# Patient Record
Sex: Female | Born: 1960 | Race: White | Hispanic: No | Marital: Married | Smoking: Current every day smoker
Health system: Southern US, Community
[De-identification: ages and names within clinical notes are randomized; demographics above are authoritative.]

## PROBLEM LIST (undated history)

## (undated) DIAGNOSIS — R232 Flushing: Secondary | ICD-10-CM

## (undated) DIAGNOSIS — K219 Gastro-esophageal reflux disease without esophagitis: Secondary | ICD-10-CM

## (undated) DIAGNOSIS — J439 Emphysema, unspecified: Secondary | ICD-10-CM

## (undated) DIAGNOSIS — R61 Generalized hyperhidrosis: Secondary | ICD-10-CM

## (undated) DIAGNOSIS — R51 Headache: Secondary | ICD-10-CM

## (undated) DIAGNOSIS — R519 Headache, unspecified: Secondary | ICD-10-CM

## (undated) DIAGNOSIS — R079 Chest pain, unspecified: Secondary | ICD-10-CM

## (undated) DIAGNOSIS — E785 Hyperlipidemia, unspecified: Secondary | ICD-10-CM

## (undated) DIAGNOSIS — G4700 Insomnia, unspecified: Secondary | ICD-10-CM

## (undated) HISTORY — DX: Emphysema, unspecified: J43.9

## (undated) HISTORY — DX: Gastro-esophageal reflux disease without esophagitis: K21.9

## (undated) HISTORY — DX: Insomnia, unspecified: G47.00

## (undated) HISTORY — DX: Flushing: R23.2

## (undated) HISTORY — PX: OTHER SURGICAL HISTORY: SHX169

## (undated) HISTORY — DX: Chest pain, unspecified: R07.9

## (undated) HISTORY — DX: Headache, unspecified: R51.9

## (undated) HISTORY — DX: Generalized hyperhidrosis: R61

## (undated) HISTORY — DX: Hyperlipidemia, unspecified: E78.5

## (undated) HISTORY — DX: Headache: R51

---

## 2008-01-25 ENCOUNTER — Encounter: Admission: RE | Admit: 2008-01-25 | Discharge: 2008-01-25 | Payer: Self-pay | Admitting: Obstetrics and Gynecology

## 2008-01-26 ENCOUNTER — Encounter: Admission: RE | Admit: 2008-01-26 | Discharge: 2008-01-26 | Payer: Self-pay | Admitting: Cardiology

## 2008-02-03 ENCOUNTER — Inpatient Hospital Stay (HOSPITAL_BASED_OUTPATIENT_CLINIC_OR_DEPARTMENT_OTHER): Admission: RE | Admit: 2008-02-03 | Discharge: 2008-02-03 | Payer: Self-pay | Admitting: Cardiology

## 2008-02-10 ENCOUNTER — Emergency Department (HOSPITAL_COMMUNITY): Admission: EM | Admit: 2008-02-10 | Discharge: 2008-02-10 | Payer: Self-pay | Admitting: Emergency Medicine

## 2011-01-08 NOTE — Cardiovascular Report (Signed)
Rebecca Sweeney, Rebecca Sweeney                ACCOUNT NO.:  [id number]   MEDICAL RECORD NO.:  [id number]          PATIENT TYPE:  OIB   LOCATION:  1962                         FACILITY:  [facility]   PHYSICIAN:  Armanda Magic, M.D.     DATE OF BIRTH:  07/26/1961   DATE OF PROCEDURE:  02/03/2008  DATE OF DISCHARGE:  02/03/2008                            CARDIAC CATHETERIZATION   REFERRING PHYSICIAN:  Geanie Logan, NP and  Dr. Abigail Miyamoto as well.   PROCEDURES:  Left heart catheterization, coronary angiography, and left  ventriculography.   OPERATOR:  Armanda Magic, MD   INDICATIONS:  Chest pain and abnormal Cardiolite.   COMPLICATIONS:  None.  IV access via right femoral artery 6-French  sheath.   IV MEDICATIONS:  Versed 1 mg and fentanyl 25 mcg.   This is a 50 year old female who has a history of chest pain syndrome  somewhat atypical, but EKG shows incomplete right bundle branch block  and her stress Cardiolite study showed irreversible defect with mild-to-  moderate ischemia in the basal anterior and mid anterior region.  She  now presents for cardiac catheterization.   The patient was brought to the cardiac catheterization laboratory in a  fasting nonsedated state.  Informed consent was obtained.  The patient  was connected to continuous heart rate and pulse oximetry monitoring and  intermittent blood pressure monitoring.  The right groin was prepped and  draped in a sterile fashion.  Xylocaine 1% was used for local  anesthesia.  Using the modified Seldinger technique, a 4-French sheath  was placed in right femoral artery.  Under fluoroscopic guidance, a 4-  [place] JL4 catheter was placed in the left coronary artery.  Multiple  plain films were taken at 30-degree RAO and 40-degree LAO views.  This  catheter was then exchanged out over a guidewire for a 4-French 3DRC  catheter.  Multiple plain films were taken at 30-degree RAO and 40-  degree LAO views of the right coronary artery.  The catheter  was then  exchanged out over a guidewire for a 4-French angled pigtail catheter,  which was placed under fluoroscopic guidance in the left ventricular  cavity.  Left ventriculography was performed in the 30-degree RAO view  using total of 30 mL of contrast at 15 mL per second.  The catheter was  then pulled back across the aortic valve with no significant gradient  noted.  At the end of the procedure, all catheters and sheaths were  removed.  Manual compression was performed until adequate hemostasis was  obtained.  The patient was then transferred back to recovery room in  stable condition.   RESULTS:  Left main coronary artery is widely patent and trifurcates  into the left anterior descending artery, ramus branch, and left  circumflex artery.   Left anterior descending artery is widely patent throughout its course  of the apex giving rise to 3 diagonal branches, all of which are widely  patent.   The ramus branch is small in caliber, but long and widely patent.   Left circumflex is widely patent throughout its course in the  AV groove.  It trifurcates into 3 obtuse marginal branches, all of which are widely  patent.   The right coronary artery is widely patent and bifurcates in a posterior  descending artery and posterior lateral artery, both of which are widely  patent.   Left ventriculography shows normal LV systolic function, EF 60%, LV  pressure 111 mmHg, aortic pressure 100/56 mmHg, and LVEDP 14 mmHg.   ASSESSMENT:  1. Normal coronary arteries.  2. Noncardiac chest pain.  3. Normal left ventricular function.   PLAN:  Discharge to home after fully awake and IV fluid and bedrest have  been completed.  The patient will follow up with my nurse practitioner,  Memory Dance, in 2 weeks for groin check, and otherwise follow up with  her primary physician.      Armanda Magic, M.D.  Electronically Signed     TT/MEDQ  D:  02/03/2008  T:  02/03/2008  Job:  [id number]   cc:    Chales Salmon. Abigail Miyamoto, M.D.  Geanie Logan, NP

## 2012-01-06 ENCOUNTER — Other Ambulatory Visit: Payer: Self-pay | Admitting: Obstetrics and Gynecology

## 2012-01-06 DIAGNOSIS — R928 Other abnormal and inconclusive findings on diagnostic imaging of breast: Secondary | ICD-10-CM

## 2012-01-13 ENCOUNTER — Ambulatory Visit
Admission: RE | Admit: 2012-01-13 | Discharge: 2012-01-13 | Disposition: A | Discharge status: Home / Self Care | Payer: BC Managed Care – PPO | Source: Ambulatory Visit | Attending: Obstetrics and Gynecology | Admitting: Obstetrics and Gynecology

## 2012-01-13 DIAGNOSIS — R928 Other abnormal and inconclusive findings on diagnostic imaging of breast: Secondary | ICD-10-CM

## 2012-04-24 ENCOUNTER — Ambulatory Visit (INDEPENDENT_AMBULATORY_CARE_PROVIDER_SITE_OTHER): Payer: BC Managed Care – PPO | Admitting: Family Medicine

## 2012-04-24 ENCOUNTER — Other Ambulatory Visit: Payer: Self-pay | Admitting: Family Medicine

## 2012-04-24 VITALS — BP 117/80 | HR 88 | Temp 97.8°F | Resp 17 | Ht 66.0 in | Wt 142.0 lb

## 2012-04-24 DIAGNOSIS — J069 Acute upper respiratory infection, unspecified: Secondary | ICD-10-CM

## 2012-04-24 DIAGNOSIS — R51 Headache: Secondary | ICD-10-CM

## 2012-04-24 DIAGNOSIS — R1011 Right upper quadrant pain: Secondary | ICD-10-CM

## 2012-04-24 DIAGNOSIS — R110 Nausea: Secondary | ICD-10-CM

## 2012-04-24 DIAGNOSIS — S39011A Strain of muscle, fascia and tendon of abdomen, initial encounter: Secondary | ICD-10-CM

## 2012-04-24 LAB — POCT URINALYSIS DIPSTICK
Bilirubin, UA: NEGATIVE
Glucose, UA: NEGATIVE
Leukocytes, UA: NEGATIVE
Nitrite, UA: NEGATIVE

## 2012-04-24 LAB — POCT CBC
Lymph, poc: 2.6 (ref 0.6–3.4)
MCHC: 30.6 g/dL — AB (ref 31.8–35.4)
MPV: 8.4 fL (ref 0–99.8)
POC Granulocyte: 8.3 — AB (ref 2–6.9)
POC LYMPH PERCENT: 23.1 %L (ref 10–50)
POC MID %: 4.4 %M (ref 0–12)
Platelet Count, POC: 442 10*3/uL — AB (ref 142–424)
RDW, POC: 12.3 %

## 2012-04-24 LAB — COMPREHENSIVE METABOLIC PANEL
ALT: 15 U/L (ref 0–35)
Alkaline Phosphatase: 39 U/L (ref 39–117)
Creat: 0.65 mg/dL (ref 0.50–1.10)
Glucose, Bld: 91 mg/dL (ref 70–99)
Sodium: 140 mEq/L (ref 135–145)
Total Bilirubin: 0.5 mg/dL (ref 0.3–1.2)
Total Protein: 7.5 g/dL (ref 6.0–8.3)

## 2012-04-24 LAB — POCT UA - MICROSCOPIC ONLY
WBC, Ur, HPF, POC: NEGATIVE
Yeast, UA: NEGATIVE

## 2012-04-24 LAB — POCT RAPID STREP A (OFFICE): Rapid Strep A Screen: NEGATIVE

## 2012-04-24 LAB — AMYLASE: Amylase: 42 U/L (ref 0–105)

## 2012-04-24 MED ORDER — PROMETHAZINE-DM 6.25-15 MG/5ML PO SYRP: 5.0000 mL | ORAL_SOLUTION | Freq: Four times a day (QID) | ORAL | Status: AC | PRN

## 2012-04-24 NOTE — Progress Notes (Signed)
Subjective:    Patient ID: Rebecca Sweeney, female    DOB: October 19, 1960, 51 y.o.   MRN: [id number]  HPI This 51 y.o. female presents for evaluation of headache,nausea.   Onset 24 hours ago with headache, RUQ pain that radiates to back.  +myalgias.  No fever; +chills; no sweats.  +HA R frontal-parietal and R periorbital region.  +mild sore throat; +glands are swollen.  +rhinorrhea x few days ago.  +sinus pressure.  Mild cough dry; no SOB.  +nausea; no vomiting.  No diarrhea.  +constant RUQ pain onset this morning after attempts at forced emesis.  Tried to force self to vomit with onset of RUQ pain.  No rash.  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  Taking Tylenol and Aleve; Aleve upsets stomach.  +burning in stomach chronic issue when taking Aleve.  History of GERD 2001; +bacteria in stomach 2001.  No tick bites.   History of migraines but none in past three years.  Severity of headache 7/10.  No vision changes; no diplopia; no neck stiffness.  No dizziness.  No pain with chewing.  No dysuria, urgency, hematuria.  RUQ pain worse with movement, rotation, bending.  RUQ pain not worse after eating or with eating.    PMH: Migraines, Regular Menses Psurg: none All:  None Medications: OCP prescribed by Dr. Nicole Kindred Social: works at [employer]; +tobacco.  Review of Systems  Constitutional: Positive for chills. Negative for fever, diaphoresis and fatigue.  HENT: Positive for congestion, sore throat and rhinorrhea. Negative for hearing loss, ear pain, facial swelling, sneezing, mouth sores, trouble swallowing, neck pain, neck stiffness, voice change, postnasal drip and ear discharge.   Eyes: Negative for photophobia, pain and visual disturbance.  Respiratory: Positive for cough. Negative for shortness of breath.   Gastrointestinal: Positive for nausea and abdominal pain. Negative for vomiting and diarrhea.  Genitourinary: Negative for dysuria,  urgency, frequency, hematuria, flank pain and pelvic pain.  Musculoskeletal: Positive for myalgias.  Skin: Negative for rash.  Neurological: Positive for headaches. Negative for tremors, weakness, light-headedness and numbness.  Psychiatric/Behavioral: Negative for confusion.    No past medical history on file.  No past surgical history on file.  Prior to Admission medications   Not on File    No Known Allergies  History   Social History  . Marital Status: Married    Spouse Name: N/A    Number of Children: N/A  . Years of Education: N/A   Occupational History  . Not on file.   Social History Main Topics  . Smoking status: Current Everyday Smoker -- 1.0 packs/day for 32 years    Types: Cigarettes  . Smokeless tobacco: Not on file  . Alcohol Use: Not on file  . Drug Use: Not on file  . Sexually Active: Not on file   Other Topics Concern  . Not on file   Social History Narrative  . No narrative on file    No family history on file.      Objective:   Physical Exam  Nursing note and vitals reviewed. Constitutional: She is oriented to person, place, and time. She appears well-developed and well-nourished.  HENT:  Head: Normocephalic and atraumatic.  Right Ear: External ear normal.  Left Ear: External ear normal.  Nose: Nose normal.  Mouth/Throat: Oropharynx is clear and moist.  Eyes: Conjunctivae and EOM are normal. Pupils are equal, round, and reactive to light.  Neck: Normal range of motion.  Neck supple. No thyromegaly present.       R ANTERIOR CERVICAL LAD.  Cardiovascular: Normal rate, regular rhythm, normal heart sounds and intact distal pulses.   No murmur heard. Pulmonary/Chest: Effort normal and breath sounds normal.  Abdominal: Soft. Bowel sounds are normal. She exhibits no distension and no mass. There is no hepatosplenomegaly. There is tenderness in the right upper quadrant and epigastric area. There is guarding and CVA tenderness. There is no  rigidity and no rebound. No hernia.  Lymphadenopathy:    She has cervical adenopathy.  Neurological: She is alert and oriented to person, place, and time. She exhibits normal muscle tone. Coordination normal.  Skin: Skin is warm and dry. No rash noted.  Psychiatric: She has a normal mood and affect. Her behavior is normal. Judgment and thought content normal.    Results for orders placed in visit on 04/24/12  POCT CBC      Component Value Range   WBC 11.4 (*) 4.6 - 10.2 K/uL   Lymph, poc 2.6  0.6 - 3.4   POC LYMPH PERCENT 23.1  10 - 50 %L   MID (cbc) 0.5  0 - 0.9   POC MID % 4.4  0 - 12 %M   POC Granulocyte 8.3 (*) 2 - 6.9   Granulocyte percent 72.5  37 - 80 %G   RBC 4.84  4.04 - 5.48 M/uL   Hemoglobin 15.4  12.2 - 16.2 g/dL   HCT, POC 16.1 (*) 09.6 - 47.9 %   MCV 104.1 (*) 80 - 97 fL   MCH, POC 31.8 (*) 27 - 31.2 pg   MCHC 30.6 (*) 31.8 - 35.4 g/dL   RDW, POC 04.5     Platelet Count, POC 442 (*) 142 - 424 K/uL   MPV 8.4  0 - 99.8 fL  POCT UA - MICROSCOPIC ONLY      Component Value Range   WBC, Ur, HPF, POC neg     RBC, urine, microscopic 0-1     Bacteria, U Microscopic trace     Mucus, UA neg     Epithelial cells, urine per micros 2-10     Crystals, Ur, HPF, POC neg     Casts, Ur, LPF, POC neg     Yeast, UA neg    POCT URINALYSIS DIPSTICK      Component Value Range   Color, UA yellow     Clarity, UA clear     Glucose, UA neg     Bilirubin, UA neg     Ketones, UA neg     Spec Grav, UA 1.010     Blood, UA neg     pH, UA 7.5     Protein, UA neg     Urobilinogen, UA 0.2     Nitrite, UA neg     Leukocytes, UA Negative    POCT URINE PREGNANCY      Component Value Range   Preg Test, Ur Negative    POCT RAPID STREP A (OFFICE)      Component Value Range   Rapid Strep A Screen Negative  Negative      Assessment & Plan:   1. Upper respiratory infection  POCT rapid strep A  2. Abdominal pain, right upper quadrant  POCT CBC, POCT UA - Microscopic Only, POCT  urinalysis dipstick, POCT urine pregnancy, Comprehensive metabolic panel, Lipase, Amylase  3. Nausea  POCT CBC, POCT urine pregnancy, Comprehensive metabolic panel, Lipase, Amylase  4.  Abdominal wall  strain 5. Headache  1. URI: New.  Supportive care with rest, fluids, Tylenol.  Rx for Phenergan DM for cough, nausea.  Avoid NSAIDs due to epigastric upset.  Call if no improvement in one week.  OOW note x 2 days. 2.  RUQ Abdominal Pain:  New.  Consistent with muscular strain; worsens with range of motion and eating has no effect.  Recommend Tylenol, heat to area; avoid lifting.  RTC if worsens or associated with eating.   3.  Abdominal Wall Strain: New.  Supportive care with rest, avoid lifting, Tylenol, heat. 4. Nausea: New.  Associated with URI and headache.  Rx for Phenergan DM PRN.   5. Headache:  New.  Associated with URI symptoms, nausea.  Ddx associated with viral illness versus recurrent migraine headache. Normal neuro exam in office; supportive care with rest, Tylenol.  RTC for acute worsening or development of focal neurological symptoms.

## 2012-04-24 NOTE — Patient Instructions (Addendum)
1. Upper respiratory infection  POCT rapid strep A, Culture, Group A Strep  2. Abdominal pain, right upper quadrant  POCT CBC, POCT UA - Microscopic Only, POCT urinalysis dipstick, POCT urine pregnancy, Comprehensive metabolic panel, Lipase, Amylase  3. Nausea  POCT CBC, POCT urine pregnancy, Comprehensive metabolic panel, Lipase, Amylase   Upper Respiratory Infection, Adult An upper respiratory infection (URI) is also sometimes known as the common cold. The upper respiratory tract includes the nose, sinuses, throat, trachea, and bronchi. Bronchi are the airways leading to the lungs. Most people improve within 1 week, but symptoms can last up to 2 weeks. A residual cough may last even longer.  CAUSES Many different viruses can infect the tissues lining the upper respiratory tract. The tissues become irritated and inflamed and often become very moist. Mucus production is also common. A cold is contagious. You can easily spread the virus to others by oral contact. This includes kissing, sharing a glass, coughing, or sneezing. Touching your mouth or nose and then touching a surface, which is then touched by another person, can also spread the virus. SYMPTOMS  Symptoms typically develop 1 to 3 days after you come in contact with a cold virus. Symptoms vary from person to person. They may include:  Runny nose.   Sneezing.   Nasal congestion.   Sinus irritation.   Sore throat.   Loss of voice (laryngitis).   Cough.   Fatigue.   Muscle aches.   Loss of appetite.   Headache.   Low-grade fever.  DIAGNOSIS  You might diagnose your own cold based on familiar symptoms, since most people get a cold 2 to 3 times a year. Your caregiver can confirm this based on your exam. Most importantly, your caregiver can check that your symptoms are not due to another disease such as strep throat, sinusitis, pneumonia, asthma, or epiglottitis. Blood tests, throat tests, and X-rays are not necessary to  diagnose a common cold, but they may sometimes be helpful in excluding other more serious diseases. Your caregiver will decide if any further tests are required. RISKS AND COMPLICATIONS  You may be at risk for a more severe case of the common cold if you smoke cigarettes, have chronic heart disease (such as heart failure) or lung disease (such as asthma), or if you have a weakened immune system. The very young and very old are also at risk for more serious infections. Bacterial sinusitis, middle ear infections, and bacterial pneumonia can complicate the common cold. The common cold can worsen asthma and chronic obstructive pulmonary disease (COPD). Sometimes, these complications can require emergency medical care and may be life-threatening. PREVENTION  The best way to protect against getting a cold is to practice good hygiene. Avoid oral or hand contact with people with cold symptoms. Wash your hands often if contact occurs. There is no clear evidence that vitamin C, vitamin E, echinacea, or exercise reduces the chance of developing a cold. However, it is always recommended to get plenty of rest and practice good nutrition. TREATMENT  Treatment is directed at relieving symptoms. There is no cure. Antibiotics are not effective, because the infection is caused by a virus, not by bacteria. Treatment may include:  Increased fluid intake. Sports drinks offer valuable electrolytes, sugars, and fluids.   Breathing heated mist or steam (vaporizer or shower).   Eating chicken soup or other clear broths, and maintaining good nutrition.   Getting plenty of rest.   Using gargles or lozenges for comfort.  Controlling fevers with ibuprofen or acetaminophen as directed by your caregiver.   Increasing usage of your inhaler if you have asthma.  Zinc gel and zinc lozenges, taken in the first 24 hours of the common cold, can shorten the duration and lessen the severity of symptoms. Pain medicines may help with  fever, muscle aches, and throat pain. A variety of non-prescription medicines are available to treat congestion and runny nose. Your caregiver can make recommendations and may suggest nasal or lung inhalers for other symptoms.  HOME CARE INSTRUCTIONS   Only take over-the-counter or prescription medicines for pain, discomfort, or fever as directed by your caregiver.   Use a warm mist humidifier or inhale steam from a shower to increase air moisture. This may keep secretions moist and make it easier to breathe.   Drink enough water and fluids to keep your urine clear or pale yellow.   Rest as needed.   Return to work when your temperature has returned to normal or as your caregiver advises. You may need to stay home longer to avoid infecting others. You can also use a face mask and careful hand washing to prevent spread of the virus.  SEEK MEDICAL CARE IF:   After the first few days, you feel you are getting worse rather than better.   You need your caregiver's advice about medicines to control symptoms.   You develop chills, worsening shortness of breath, or brown or red sputum. These may be signs of pneumonia.   You develop yellow or brown nasal discharge or pain in the face, especially when you bend forward. These may be signs of sinusitis.   You develop a fever, swollen neck glands, pain with swallowing, or white areas in the back of your throat. These may be signs of strep throat.  SEEK IMMEDIATE MEDICAL CARE IF:   You have a fever.   You develop severe or persistent headache, ear pain, sinus pain, or chest pain.   You develop wheezing, a prolonged cough, cough up blood, or have a change in your usual mucus (if you have chronic lung disease).   You develop sore muscles or a stiff neck.  Document Released: 02/05/2001 Document Revised: 08/01/2011 Document Reviewed: 12/14/2010 [facility] Patient Information 2012 [place], [place].

## 2012-04-25 NOTE — Progress Notes (Signed)
Reviewed and agree.

## 2012-04-27 LAB — CULTURE, GROUP A STREP

## 2013-04-20 ENCOUNTER — Other Ambulatory Visit: Payer: Self-pay | Admitting: Obstetrics and Gynecology

## 2013-04-20 DIAGNOSIS — N6321 Unspecified lump in the left breast, upper outer quadrant: Secondary | ICD-10-CM

## 2013-04-27 ENCOUNTER — Ambulatory Visit
Admission: RE | Admit: 2013-04-27 | Discharge: 2013-04-27 | Disposition: A | Discharge status: Home / Self Care | Payer: PRIVATE HEALTH INSURANCE | Source: Ambulatory Visit | Attending: Obstetrics and Gynecology | Admitting: Obstetrics and Gynecology

## 2013-04-27 DIAGNOSIS — N6321 Unspecified lump in the left breast, upper outer quadrant: Secondary | ICD-10-CM

## 2016-03-18 ENCOUNTER — Other Ambulatory Visit: Payer: Self-pay | Admitting: Obstetrics and Gynecology

## 2016-03-18 DIAGNOSIS — R928 Other abnormal and inconclusive findings on diagnostic imaging of breast: Secondary | ICD-10-CM

## 2016-03-19 ENCOUNTER — Other Ambulatory Visit: Payer: Self-pay | Admitting: Obstetrics and Gynecology

## 2016-03-19 DIAGNOSIS — R928 Other abnormal and inconclusive findings on diagnostic imaging of breast: Secondary | ICD-10-CM

## 2016-03-22 ENCOUNTER — Other Ambulatory Visit: Payer: PRIVATE HEALTH INSURANCE

## 2016-03-25 ENCOUNTER — Ambulatory Visit
Admission: RE | Admit: 2016-03-25 | Discharge: 2016-03-25 | Disposition: A | Discharge status: Home / Self Care | Payer: PRIVATE HEALTH INSURANCE | Source: Ambulatory Visit | Attending: Obstetrics and Gynecology | Admitting: Obstetrics and Gynecology

## 2016-03-25 DIAGNOSIS — R928 Other abnormal and inconclusive findings on diagnostic imaging of breast: Secondary | ICD-10-CM

## 2016-04-03 ENCOUNTER — Encounter: Payer: Self-pay | Admitting: Cardiology

## 2016-04-18 ENCOUNTER — Encounter (INDEPENDENT_AMBULATORY_CARE_PROVIDER_SITE_OTHER): Payer: Self-pay

## 2016-04-18 ENCOUNTER — Ambulatory Visit (INDEPENDENT_AMBULATORY_CARE_PROVIDER_SITE_OTHER): Payer: PRIVATE HEALTH INSURANCE | Admitting: Cardiology

## 2016-04-18 ENCOUNTER — Encounter: Payer: Self-pay | Admitting: Cardiology

## 2016-04-18 VITALS — BP 124/72 | HR 80 | Ht 66.0 in | Wt 136.0 lb

## 2016-04-18 DIAGNOSIS — R0789 Other chest pain: Secondary | ICD-10-CM | POA: Diagnosis not present

## 2016-04-18 DIAGNOSIS — N951 Menopausal and female climacteric states: Secondary | ICD-10-CM

## 2016-04-18 DIAGNOSIS — R232 Flushing: Secondary | ICD-10-CM

## 2016-04-18 DIAGNOSIS — Z720 Tobacco use: Secondary | ICD-10-CM | POA: Diagnosis not present

## 2016-04-18 NOTE — Progress Notes (Signed)
Cardiology Office Note    Date:  04/18/2016   ID:  Rebecca ChamberSonia Branch, DOB 1960-11-13, MRN [id number]  PCP:  No primary care provider on file.  Cardiologist:   Donato SchultzMark Skains, MD     History of Present Illness:  Rebecca Sweeney is a 55 y.o. female here for evaluation of chest pain at the request of Dr. Nilda SimmerKristi Smith.  She has had shortness of breath activity as well as, cutting grass, very fatigued, hot flash. Chest discomfort at times. Always feeling drained as far as energy is concerned. Night sweats at times, insomnia.  3 years ago tired with CP, shoveling with pain. Husband Kathlene NovemberMike is having shoulder surgery and she feels as though she is having to carry more of the load so to speak.   After she may feel her chest discomfort, she likes to put ice packs on her neck or splash herself with cold water to see if this helps. She also states that she may feel some abdominal discomfort following these events and feels very tired and washed out. She has been feeling hot flashes and night sweats, question hormonal?  Only feels symptoms when she rushes herself, stress. Not when walking up hills.   No early family CAD.  She is a smoker.40 years.   Past Medical History:  Diagnosis Date  . Chest pain   . Frequent headaches   . Hot flashes   . Insomnia   . Night sweats     Past Surgical History:  Procedure Laterality Date  . NONE      Current Medications: Outpatient Medications Prior to Visit  Medication Sig Dispense Refill  . buPROPion (WELLBUTRIN XL) 150 MG 24 hr tablet Take 150 mg by mouth daily.    Marland Kitchen. doxycycline (VIBRAMYCIN) 100 MG capsule Take 100 mg by mouth 2 (two) times daily.    . drospirenone-estradiol (ANGELIQ) 0.5-1 MG tablet Take 1 tablet by mouth daily.     No facility-administered medications prior to visit.      Allergies:   Review of patient's allergies indicates no known allergies.   Social History   Social History  . Marital status: Married    Spouse name: MIODRAG    . Number of children: 3  . Years of education: 12   Occupational History  . REPLACEMENT UNLIMITED    Social History Main Topics  . Smoking status: Current Every Day Smoker    Packs/day: 1.00    Years: 32.00    Types: Cigarettes  . Smokeless tobacco: Never Used  . Alcohol use No  . Drug use: No  . Sexual activity: Not Asked   Other Topics Concern  . None   Social History Narrative  . None     Family History:  As above   ROS:   Please see the history of present illness.    ROS All other systems reviewed and are negative.   PHYSICAL EXAM:   VS:  BP 124/72   Pulse 80   Ht 5\' 6"  (1.676 m)   Wt 136 lb (61.7 kg)   LMP 03/18/2012   BMI 21.95 kg/m    GEN: Thin, in no acute distress  HEENT: normal  Neck: no JVD, carotid bruits, or masses Cardiac: RRR; no murmurs, rubs, or gallops,no edema  Respiratory:  clear to auscultation bilaterally, normal work of breathing GI: soft, nontender, nondistended, + BS MS: no deformity or atrophy  Skin: warm and dry, no rash Neuro:  Alert and Oriented x 3, Strength and  sensation are intact Psych: euthymic mood, full affect  Wt Readings from Last 3 Encounters:  04/18/16 136 lb (61.7 kg)  04/24/12 142 lb (64.4 kg)      Studies/Labs Reviewed:   EKG:  EKG is ordered today.  The ekg ordered today demonstrates 04/18/16-sinus rhythm, incomplete right bundle branch block, heart rate 83 bpm personally viewed  Recent Labs: No results found for requested labs within last 8760 hours.   Lipid Panel No results found for: CHOL, TRIG, HDL, CHOLHDL, VLDL, LDLCALC, LDLDIRECT  Additional studies/ records that were reviewed today include:  Prior office notes, lab work, EKG reviewed    ASSESSMENT:    1. Other chest pain   2. Tobacco use   3. Hot flashes      PLAN:  In order of problems listed above:  Chest pain  - Could be stress related, perhaps musculoskeletal. She has had quite an extensive smoking history and still continues to  smoke. In 2009 she did undergo cardiac catheterization with Dr. Mayford Knife and this was reassuring with normal coronary arteries and normal left ventricular function.  - Since it is been a few years since her last evaluation, I will go ahead and order a nuclear stress test to provide further stratification.  Tobacco use  - Strongly encourage tobacco cessation.  Hot flashes/sweats  - She is being followed routinely by GYN. Symptomatic postmenopausal female.  - She is going to be seeing GI I believe for evaluation of abdominal pain.   Medication Adjustments/Labs and Tests Ordered: Current medicines are reviewed at length with the patient today.  Concerns regarding medicines are outlined above.  Medication changes, Labs and Tests ordered today are listed in the Patient Instructions below. Patient Instructions  Medication Instructions:  The current medical regimen is effective;  continue present plan and medications.  Testing/Procedures: Your physician has requested that you have a lexiscan myoview. For further information please visit [email or web address] Please follow instruction sheet, as given.  Follow-Up: Further follow up will be based on the results of the above testing.   If you need a refill on your cardiac medications before your next appointment, please call your pharmacy.  Thank you for choosing [facility]!!        Signed, Donato Schultz, MD  04/18/2016 11:36 AM    [facility] [address] [place], [place], [place]  [address] Phone: [phone]; Fax: [phone]

## 2016-04-18 NOTE — Patient Instructions (Signed)
Medication Instructions:  The current medical regimen is effective;  continue present plan and medications.  Testing/Procedures: Your physician has requested that you have a lexiscan myoview. For further information please visit [email or web address]. Please follow instruction sheet, as given.  Follow-Up: Further follow up will be based on the results of the above testing.  If you need a refill on your cardiac medications before your next appointment, please call your pharmacy.  Thank you for choosing [facility]!!      

## 2016-04-22 ENCOUNTER — Telehealth (HOSPITAL_COMMUNITY): Payer: Self-pay | Admitting: *Deleted

## 2016-04-22 NOTE — Telephone Encounter (Signed)
Attempted to call patient regarding upcoming appointment- no answer, unable to leave message on answering machine.  Antionette CharMary J Norva Bowe, RN

## 2016-04-23 ENCOUNTER — Encounter (INDEPENDENT_AMBULATORY_CARE_PROVIDER_SITE_OTHER): Payer: Self-pay

## 2016-04-23 ENCOUNTER — Ambulatory Visit (HOSPITAL_COMMUNITY): Payer: No Typology Code available for payment source | Attending: Cardiology

## 2016-04-23 DIAGNOSIS — R0609 Other forms of dyspnea: Secondary | ICD-10-CM | POA: Insufficient documentation

## 2016-04-23 DIAGNOSIS — R0789 Other chest pain: Secondary | ICD-10-CM | POA: Insufficient documentation

## 2016-04-23 DIAGNOSIS — R5383 Other fatigue: Secondary | ICD-10-CM | POA: Diagnosis not present

## 2016-04-23 DIAGNOSIS — Z87891 Personal history of nicotine dependence: Secondary | ICD-10-CM | POA: Insufficient documentation

## 2016-04-23 LAB — MYOCARDIAL PERFUSION IMAGING
CHL CUP MPHR: 166 {beats}/min
CHL CUP NUCLEAR SDS: 0
CHL CUP NUCLEAR SRS: 3
CHL CUP NUCLEAR SSS: 3
CSEPED: 9 min
CSEPEW: 10.1 METS
CSEPHR: 87 %
CSEPPHR: 146 {beats}/min
Exercise duration (sec): 0 s
LHR: 0.25
LV dias vol: 103 mL (ref 46–106)
LV sys vol: 45 mL
RPE: 16
Rest HR: 59 {beats}/min
TID: 1.03

## 2016-04-23 MED ORDER — TECHNETIUM TC 99M TETROFOSMIN IV KIT
30.0000 | PACK | Freq: Once | INTRAVENOUS | Status: AC | PRN
  Administered 2016-04-23: 30 via INTRAVENOUS
  Filled 2016-04-23: qty 30

## 2016-04-23 MED ORDER — TECHNETIUM TC 99M TETROFOSMIN IV KIT
10.6000 | PACK | Freq: Once | INTRAVENOUS | Status: AC | PRN
  Administered 2016-04-23: 11 via INTRAVENOUS
  Filled 2016-04-23: qty 11

## 2016-04-30 ENCOUNTER — Encounter: Payer: Self-pay | Admitting: *Deleted

## 2016-10-03 ENCOUNTER — Ambulatory Visit: Payer: Self-pay | Admitting: Registered Nurse

## 2016-10-03 VITALS — BP 118/70 | HR 82 | Temp 97.8°F

## 2016-10-03 DIAGNOSIS — J0101 Acute recurrent maxillary sinusitis: Secondary | ICD-10-CM

## 2016-10-03 NOTE — Progress Notes (Signed)
Pt c/o generalized body aches, "feeling like I've just been beat up." Fatigue, HA. Sx since yesterday. Denies fever/chills, sore throat.

## 2016-10-03 NOTE — Progress Notes (Signed)
Subjective:    Patient ID: Rebecca Sweeney, female    DOB: July 09, 1961, 56 y.o.   MRN: [id number]  Caucasian female exposed to sick coworkers with influenza like illness complains of myalgias, headache and fatigue.  Restarted her singulair and has been helping for cough, post nasal drip/congestion but not body aches or headache      Review of Systems  Constitutional: Positive for activity change and fatigue. Negative for appetite change, chills, diaphoresis, fever and unexpected weight change.  HENT: Positive for congestion, postnasal drip and rhinorrhea. Negative for dental problem, drooling, ear discharge, ear pain, facial swelling, hearing loss, mouth sores, nosebleeds, sinus pain, sinus pressure, sneezing, sore throat, tinnitus, trouble swallowing and voice change.   Eyes: Negative for photophobia, pain, discharge, redness, itching and visual disturbance.  Respiratory: Negative for cough, choking, chest tightness, shortness of breath, wheezing and stridor.   Cardiovascular: Negative for chest pain, palpitations and leg swelling.  Gastrointestinal: Negative for abdominal distention, abdominal pain, blood in stool, constipation, diarrhea, nausea and vomiting.  Endocrine: Negative for cold intolerance and heat intolerance.  Genitourinary: Negative for difficulty urinating, dysuria and hematuria.  Musculoskeletal: Positive for myalgias. Negative for arthralgias, back pain, gait problem, joint swelling, neck pain and neck stiffness.  Skin: Negative for color change, pallor, rash and wound.  Allergic/Immunologic: Positive for environmental allergies. Negative for food allergies.  Neurological: Positive for headaches. Negative for dizziness, tremors, seizures, syncope, facial asymmetry, speech difficulty, weakness, light-headedness and numbness.  Hematological: Negative for adenopathy. Does not bruise/bleed easily.  Psychiatric/Behavioral: Negative for agitation, behavioral problems, confusion and  sleep disturbance.       Objective:   Physical Exam  Constitutional: She is oriented to person, place, and time. Vital signs are normal. She appears well-developed and well-nourished. She is active and cooperative.  Non-toxic appearance. She does not have a sickly appearance. She appears ill. No distress.  HENT:  Head: Normocephalic and atraumatic.  Right Ear: Hearing, external ear and ear canal normal. A middle ear effusion is present.  Left Ear: Hearing, external ear and ear canal normal. A middle ear effusion is present.  Nose: Mucosal edema and rhinorrhea present. No nose lacerations, sinus tenderness, nasal deformity, septal deviation or nasal septal hematoma. No epistaxis.  No foreign bodies. Right sinus exhibits maxillary sinus tenderness and frontal sinus tenderness. Left sinus exhibits maxillary sinus tenderness and frontal sinus tenderness.  Mouth/Throat: Uvula is midline and mucous membranes are normal. Mucous membranes are not pale, not dry and not cyanotic. She does not have dentures. No oral lesions. No trismus in the jaw. Normal dentition. No dental abscesses, uvula swelling, lacerations or dental caries. Posterior oropharyngeal edema and posterior oropharyngeal erythema present. No oropharyngeal exudate or tonsillar abscesses.  Cobblestoning posterior pharynx; nasal turbinates edema/erythema white discharge; bilateral allergic shiners; TMS air fluid level clear  Eyes: Conjunctivae, EOM and lids are normal. Pupils are equal, round, and reactive to light. Right eye exhibits no chemosis, no discharge, no exudate and no hordeolum. No foreign body present in the right eye. Left eye exhibits no chemosis, no discharge, no exudate and no hordeolum. No foreign body present in the left eye. Right conjunctiva is not injected. Right conjunctiva has no hemorrhage. Left conjunctiva is not injected. Left conjunctiva has no hemorrhage. No scleral icterus. Right eye exhibits normal extraocular motion  and no nystagmus. Left eye exhibits normal extraocular motion and no nystagmus. Right pupil is round and reactive. Left pupil is round and reactive. Pupils are equal.  Neck: Trachea normal  and normal range of motion. Neck supple. No tracheal tenderness, no spinous process tenderness and no muscular tenderness present. No neck rigidity. No tracheal deviation, no edema, no erythema and normal range of motion present. No thyroid mass and no thyromegaly present.  Cardiovascular: Normal rate, regular rhythm, S1 normal, S2 normal, normal heart sounds and intact distal pulses.  PMI is not displaced.  Exam reveals no gallop and no friction rub.   No murmur heard. Pulses:      Radial pulses are 2+ on the right side, and 2+ on the left side.  Pulmonary/Chest: Effort normal and breath sounds normal. No accessory muscle usage or stridor. No respiratory distress. She has no decreased breath sounds. She has no wheezes. She has no rhonchi. She has no rales. She exhibits no tenderness.  Intermittent nonproductive cough observed; speaks full sentences without difficulty  Abdominal: Soft. Normal appearance and bowel sounds are normal. She exhibits no shifting dullness, no distension, no pulsatile liver, no fluid wave, no abdominal bruit, no ascites, no pulsatile midline mass and no mass. There is no hepatosplenomegaly, splenomegaly or hepatomegaly. There is tenderness in the right lower quadrant. There is rigidity. There is no rebound, no guarding, no CVA tenderness, no tenderness at McBurney's point and negative Murphy's sign. No hernia. Hernia confirmed negative in the ventral area.  Dull to percussion RLQ RUQ  tympanny LUQ dull LLQ normoactive bowel sounds x 4 quads  Musculoskeletal: Normal range of motion. She exhibits no edema or tenderness.       Right shoulder: Normal.       Left shoulder: Normal.       Right hip: Normal.       Left hip: Normal.       Right knee: Normal.       Left knee: Normal.       Right  ankle: Normal.       Left ankle: Normal.       Cervical back: Normal.       Thoracic back: Normal.       Lumbar back: Normal.       Right hand: Normal.       Left hand: Normal.  Lymphadenopathy:       Head (right side): No submental, no submandibular, no tonsillar, no preauricular, no posterior auricular and no occipital adenopathy present.       Head (left side): No submental, no submandibular, no tonsillar, no preauricular, no posterior auricular and no occipital adenopathy present.    She has no cervical adenopathy.       Right cervical: No superficial cervical, no deep cervical and no posterior cervical adenopathy present.      Left cervical: No superficial cervical, no deep cervical and no posterior cervical adenopathy present.  Neurological: She is alert and oriented to person, place, and time. She has normal strength. She is not disoriented. She displays no atrophy and no tremor. No cranial nerve deficit or sensory deficit. She exhibits normal muscle tone. She displays no seizure activity. Coordination and gait normal. GCS eye subscore is 4. GCS verbal subscore is 5. GCS motor subscore is 6.  Skin: Skin is warm, dry and intact. No abrasion, no bruising, no burn, no ecchymosis, no laceration, no lesion, no petechiae and no rash noted. She is not diaphoretic. No cyanosis or erythema. No pallor. Nails show no clubbing.  Psychiatric: She has a normal mood and affect. Her speech is normal and behavior is normal. Judgment and thought content normal. Cognition and  memory are normal.  Nursing note and vitals reviewed.         Assessment & Plan:  A-recurrent maxillary sinusitis acute  P-Supportive treatment.   No evidence of invasive bacterial infection, non toxic and well hydrated.  This is most likely self limiting viral infection.  I do not see where any further testing or imaging is necessary at this time.   I will suggest supportive care, rest, good hygiene and encourage the patient to  take adequate fluids.  The patient is to return to clinic or EMERGENCY ROOM if symptoms worsen or change significantly e.g. ear pain, fever, purulent discharge from ears or bleeding.  Otitis media effusion noted on exam bilaterally today probably due to post nasal drip from URI Patient verbalized agreement and understanding of treatment plan.    Suspect Viral illness: no evidence of invasive bacterial infection, non toxic and well hydrated.  This is most likely self limiting viral infection.  I do not see where any further testing or imaging is necessary at this time.   I will suggest supportive care, rest, good hygiene and encourage the patient to take adequate fluids.  flonase 1 spray each nostril BID prn, nasal saline 1-2 sprays each nostril prn q2h, tylenol 1000mg  po QID prn pain/fever and or motrin 800mg  po TID prn.shower BID  Hydrate  Discussed honey with lemon and salt water gargles for comfort also.  May continue singulair 10mg  po Qhs at home.  The patient is to return to clinic or EMERGENCY ROOM if symptoms worsen or change significantly e.g. fever, lethargy, SOB, wheezing.  Patient verbalized agreement and understanding of treatment plan.    Restart flonase 1 spray each nostril BID, saline 2 sprays each nostril q2h prn congestion.  If no improvement with 48 hours of saline and flonase use start doxycycline 100mg  po BID x 10 days Dispensed from [facility] see paper chart.  Patient felt doxycycline worked better than amoxicillin/augmentin in past year. No evidence of systemic bacterial infection, non toxic and well hydrated.  I do not see where any further testing or imaging is necessary at this time.   I will suggest supportive care, rest, good hygiene and encourage the patient to take adequate fluids.  The patient is to return to clinic or EMERGENCY ROOM if symptoms worsen or change significantly.   Patient verbalized agreement and understanding of treatment plan and had no further questions at this time.    P2:  Hand washing and cover cough

## 2016-11-21 ENCOUNTER — Ambulatory Visit: Payer: Self-pay | Admitting: Registered Nurse

## 2016-11-21 VITALS — BP 112/74 | HR 87 | Temp 97.8°F

## 2016-11-21 DIAGNOSIS — J111 Influenza due to unidentified influenza virus with other respiratory manifestations: Secondary | ICD-10-CM

## 2016-11-21 DIAGNOSIS — J302 Other seasonal allergic rhinitis: Secondary | ICD-10-CM | POA: Insufficient documentation

## 2016-11-21 DIAGNOSIS — H6593 Unspecified nonsuppurative otitis media, bilateral: Secondary | ICD-10-CM

## 2016-11-21 DIAGNOSIS — J301 Allergic rhinitis due to pollen: Secondary | ICD-10-CM

## 2016-11-21 DIAGNOSIS — R69 Illness, unspecified: Principal | ICD-10-CM

## 2016-11-21 MED ORDER — OSELTAMIVIR PHOSPHATE 75 MG PO CAPS: 75.0000 mg | ORAL_CAPSULE | Freq: Two times a day (BID) | ORAL | 0 refills | Status: AC

## 2016-11-21 MED ORDER — LORATADINE 10 MG PO TABS: 10.0000 mg | ORAL_TABLET | Freq: Every day | ORAL | 1 refills | Status: DC

## 2016-11-21 MED ORDER — AZITHROMYCIN 250 MG PO TABS: ORAL_TABLET | ORAL | 0 refills | Status: DC

## 2016-11-21 NOTE — Patient Instructions (Signed)
Continue singulair 10mg  by mouth daily flonase 1 spray each nostril twice a day after shower Nasal saline 2 sprays each nostril every 2 hours while awake as needed for congestion Use over the counter cough suppressant delsym, nyquil and/or robitussin as needed per manufacturer's instructions Cough drops by mouth ever 2 hours as needed for cough Shower twice a day Hydrate, honey with lemon, popsicles Rest  Start claritin 10mg  by mouth daily Start tamiflu 75mg  by mouth twice a day for 5 days Tylenol 1000mg  by mouth every 6 hours as needed for pain  If worsening sinus pressure/sore throat/ear pain/productive cough start azithromycin 500mg  by mouth day 1 (2 tabs) then take 1 tablet 250mg  by mouth day days 2-[facility]  Seek re-evaluation at PCM/urgent care/ER same day if difficulty breathing/shortness of breath/coughing up blood, vomiting blood, pooping or peeing blood red or black.  If unable to keep down fluids, getting dizzy, short of breath or unable to pee every 6 -8 hours (urine orange/brown)  Otitis Media With Effusion, Pediatric Otitis media with effusion (OME) occurs when there is inflammation of the middle ear and fluid in the middle ear space. There are no signs and symptoms of infection. The middle ear space contains air and the bones for hearing. Air in the middle ear space helps to transmit sound to the brain. OME is a common condition in children, and it often occurs after an ear infection. This condition may be present for several weeks or longer after an ear infection. Most cases of this condition get better on their own. What are the causes? OME is caused by a blockage of the eustachian tube in one or both ears. These tubes drain fluid in the ears to the back of the nose (nasopharynx). If the tissue in the tube swells up (edema), the tube closes. This prevents fluid from draining. Blockage can be caused by:  Ear infections.  Colds and other upper respiratory  infections.  Allergies.  Irritants, such as tobacco smoke.  Enlarged adenoids. The adenoids are areas of soft tissue located high in the back of the throat, behind the nose and the roof of the mouth. They are part of the body's natural defense (immune) system.  A mass in the nasopharynx.  Damage to the ear caused by pressure changes (barotrauma). What increases the risk? Your child is more likely to develop this condition if:  He or she has repeated ear and sinus infections.  He or she has allergies.  He or she is exposed to tobacco smoke.  He or she attends daycare.  He or she is not breastfed. What are the signs or symptoms? Symptoms of this condition may not be obvious. Sometimes this condition does not have any symptoms, or symptoms may overlap with those of a cold or upper respiratory tract illness. Symptoms of this condition include:  Temporary hearing loss.  A feeling of fullness in the ear without pain.  Irritability or agitation.  Balance (vestibular) problems. As a result of hearing loss, your child may:  Listen to the TV at a loud volume.  Not respond to questions.  Ask "What?" often when spoken to.  Mistake or confuse one sound or word for another.  Perform poorly at school.  Have a poor attention span.  Become agitated or irritated easily. How is this diagnosed? This condition is diagnosed with an ear exam. Your child's health care provider will look inside your child's ear with an instrument (otoscope) to check for redness,  swelling, and fluid. Other tests may be done, including:  A test to check the movement of the eardrum (pneumatic otoscopy). This is done by squeezing a small amount of air into the ear.  A test that changes air pressure in the middle ear to check how well the eardrum moves and to see if the eustachian tube is working (tympanogram).  Hearing test (audiogram). This test involves playing tones at different pitches to see if your  child can hear each tone. How is this treated? Treatment for this condition depends on the cause. In many cases, the fluid goes away on its own. In some cases, your child may need a procedure to create a hole in the eardrum to allow fluid to drain (myringotomy) and to insert small drainage tubes (tympanostomy tubes) into the eardrums. These tubes help to drain fluid and prevent infection. This procedure may be recommended if:  OME does not get better over several months.  Your child has many ear infections within several months.  Your child has noticeable hearing loss.  Your child has problems with speech and language development. Surgery may also be done to remove the adenoids (adenoidectomy). Follow these instructions at home:  Give over-the-counter and prescription medicines only as told by your child's health care provider.  Keep children away from any tobacco smoke.  Keep all follow-up visits as told by your child's health care provider. This is important. How is this prevented?  Keep your child's vaccinations up to date. Make sure your child gets all recommended vaccinations, including a pneumonia and flu vaccine.  Encourage hand washing. Your child should wash his or her hands often with soap and water. If there is no soap and water, he or she should use hand sanitizer.  Avoid exposing your child to tobacco smoke.  Breastfeed your baby, if possible. Babies who are breastfed as long as possible are less likely to develop this condition. Contact a health care provider if:  Your child's hearing does not get better after 3 months.  Your child's hearing is worse.  Your child has ear pain.  Your child has a fever.  Your child has drainage from the ear.  Your child is dizzy.  Your child has a lump on his or her neck. Get help right away if:  Your child has bleeding from the nose.  Your child cannot move part of her or his face.  Your child has trouble  breathing.  Your child cannot smell.  Your child develops severe congestion.  Your child develops weakness.  Your child who is younger than 3 months has a temperature of 100F (38C) or higher. Summary  Otitis media with effusion (OME) occurs when there is inflammation of the middle ear and fluid in the middle ear space.  This condition is caused by blockage of one or both eustachian tubes, which drain fluid in the ears to the back of the nose.  Symptoms of this condition can include temporary hearing loss, a feeling of fullness in the ear, irritability or agitation, and balance (vestibular) problems. Sometimes, there are no symptoms.  This condition is diagnosed with an ear exam and tests, such as pneumatic otoscopy, tympanogram, and audiogram.  Treatment for this condition depends on the cause. In many cases, the fluid goes away on its own. This information is not intended to replace advice given to you by your health care provider. Make sure you discuss any questions you have with your health care provider. Document Released: 11/02/2003 Document  Revised: 07/04/2016 Document Reviewed: 07/04/2016 Elsevier Interactive Patient Education  2017 [employer] Pharyngitis Pharyngitis is redness, pain, and swelling (inflammation) of your pharynx. What are the causes? Pharyngitis is usually caused by infection. Most of the time, these infections are from viruses (viral) and are part of a cold. However, sometimes pharyngitis is caused by bacteria (bacterial). Pharyngitis can also be caused by allergies. Viral pharyngitis may be spread from person to person by coughing, sneezing, and personal items or utensils (cups, forks, spoons, toothbrushes). Bacterial pharyngitis may be spread from person to person by more intimate contact, such as kissing. What are the signs or symptoms? Symptoms of pharyngitis include:  Sore throat.  Tiredness (fatigue).  Low-grade fever.  Headache.  Joint pain  and muscle aches.  Skin rashes.  Swollen lymph nodes.  Plaque-like film on throat or tonsils (often seen with bacterial pharyngitis). How is this diagnosed? Your health care provider will ask you questions about your illness and your symptoms. Your medical history, along with a physical exam, is often all that is needed to diagnose pharyngitis. Sometimes, a rapid strep test is done. Other lab tests may also be done, depending on the suspected cause. How is this treated? Viral pharyngitis will usually get better in 3-4 days without the use of medicine. Bacterial pharyngitis is treated with medicines that kill germs (antibiotics). Follow these instructions at home:  Drink enough water and fluids to keep your urine clear or pale yellow.  Only take over-the-counter or prescription medicines as directed by your health care provider:  If you are prescribed antibiotics, make sure you finish them even if you start to feel better.  Do not take aspirin.  Get lots of rest.  Gargle with 8 oz of salt water ( tsp of salt per 1 qt of water) as often as every 1-2 hours to soothe your throat.  Throat lozenges (if you are not at risk for choking) or sprays may be used to soothe your throat. Contact a health care provider if:  You have large, tender lumps in your neck.  You have a rash.  You cough up green, yellow-brown, or bloody spit. Get help right away if:  Your neck becomes stiff.  You drool or are unable to swallow liquids.  You vomit or are unable to keep medicines or liquids down.  You have severe pain that does not go away with the use of recommended medicines.  You have trouble breathing (not caused by a stuffy nose). This information is not intended to replace advice given to you by your health care provider. Make sure you discuss any questions you have with your health care provider. Document Released: 08/12/2005 Document Revised: 01/18/2016 Document Reviewed:  04/19/2013 Elsevier Interactive Patient Education  2017 [employer] Allergies, Adult An allergy is when your body's defense system (immune system) overreacts to an otherwise harmless substance (allergen) that you breathe in or eat or something that touches your skin. When you come into contact with something that you are allergic to, your immune system produces certain proteins (antibodies). These proteins cause cells to release chemicals (histamines) that trigger the symptoms of an allergic reaction. Allergies often affect the nasal passages (allergic rhinitis), eyes (allergic conjunctivitis), skin (atopic dermatitis), and stomach. Allergies can be mild or severe. Allergies cannot spread from person to person (are not contagious). They can develop at any age and may be outgrown. What are the causes? Allergies can be caused by any substance that your immune system mistakenly targets as harmful.  These may include:  Outdoor allergens, such as pollen, grass, weeds, car exhaust, and mold spores.  Indoor allergens, such as dust, smoke, mold, and pet dander.  Foods, especially peanuts, milk, eggs, fish, shellfish, soy, nuts, and wheat.  Medicines, such as penicillin.  Skin irritants, such as detergents, chemicals, and latex.  Perfume.  Insect bites or stings. What increases the risk? You may be at greater risk of allergies if other people in your family have allergies. What are the signs or symptoms? Symptoms depend on what type of allergy you have. They may include:  Runny, stuffy nose.  Sneezing.  Itchy mouth, ears, or throat.  Postnasal drip.  Sore throat.  Itchy, red, watery, or puffy eyes.  Skin rash or hives.  Stomach pain.  Vomiting.  Diarrhea.  Bloating.  Wheezing or coughing. People with a severe allergy to food, medicine, or an insect bite may have a life-threatening allergic reaction (anaphylaxis). Symptoms of anaphylaxis  include:  Hives.  Itching.  Flushed face.  Swollen lips, tongue, or mouth.  Tight or swollen throat.  Chest pain or tightness in the chest.  Trouble breathing or shortness of breath.  Rapid heartbeat.  Dizziness or fainting.  Vomiting.  Diarrhea.  Pain in the abdomen. How is this diagnosed? This condition is diagnosed based on:  Your symptoms.  Your family and medical history.  A physical exam. You may need to see a health care provider who specializes in treating allergies (allergist). You may also have tests, including:  Skin tests to see which allergens are causing your symptoms, such as:  Skin prick test. In this test, your skin is pricked with a tiny needle and exposed to small amounts of possible allergens to see if your skin reacts.  Intradermal skin test. In this test, a small amount of allergen is injected under your skin to see if your skin reacts.  Patch test. In this test, a small amount of allergen is placed on your skin and then your skin is covered with a bandage. Your health care provider will check your skin after a couple of days to see if a rash has developed.  Blood tests.  Challenges tests. In this test, you inhale a small amount of allergen by mouth to see if you have an allergic reaction. You may also be asked to:  Keep a food diary. A food diary is a record of all the foods and drinks you have in a day and any symptoms you experience.  Practice an elimination diet. An elimination diet involves eliminating specific foods from your diet and then adding them back in one by one to find out if a certain food causes an allergic reaction. How is this treated? Treatment for allergies depends on your symptoms. Treatment may include:  Cold compresses to soothe itching and swelling.  Eye drops.  Nasal sprays.  Using a saline spray or container (neti pot) to flush out the nose (nasal irrigation). These methods can help clear away mucus and keep  the nasal passages moist.  Using a humidifier.  Oral antihistamines or other medicines to block allergic reaction and inflammation.  Skin creams to treat rashes or itching.  Diet changes to eliminate food allergy triggers.  Repeated exposure to tiny amounts of allergens to build up a tolerance and prevent future allergic reactions (immunotherapy). These include:  Allergy shots.  Oral treatment. This involves taking small doses of an allergen under the tongue (sublingual immunotherapy).  Emergency epinephrine injection (auto-injector) in case of  an allergic emergency. This is a self-injectable, pre-measured medicine that must be given within the first few minutes of a serious allergic reaction. Follow these instructions at home:  Avoid known allergens whenever possible.  If you suffer from airborne allergens, wash out your nose daily. You can do this with a saline spray or a neti pot to flush out your nose (nasal irrigation).  Take over-the-counter and prescription medicines only as told by your health care provider.  Keep all follow-up visits as told by your health care provider. This is important.  If you are at risk of a severe allergic reaction (anaphylaxis), keep your auto-injector with you at all times.  If you have ever had anaphylaxis, wear a medical alert bracelet or necklace that states you have a severe allergy. Contact a health care provider if:  Your symptoms do not improve with treatment. Get help right away if:  You have symptoms of anaphylaxis, such as:  Swollen mouth, tongue, or throat.  Pain or tightness in your chest.  Trouble breathing or shortness of breath.  Dizziness or fainting.  Severe abdominal pain, vomiting, or diarrhea. This information is not intended to replace advice given to you by your health care provider. Make sure you discuss any questions you have with your health care provider. Document Released: 11/05/2002 Document Revised:  04/11/2016 Document Reviewed: 02/28/2016 Elsevier Interactive Patient Education  2017 [employer] Influenza, Adult Influenza, more commonly known as "the flu," is a viral infection that primarily affects the respiratory tract. The respiratory tract includes organs that help you breathe, such as the lungs, nose, and throat. The flu causes many common cold symptoms, as well as a high fever and body aches. The flu spreads easily from person to person (is contagious). Getting a flu shot (influenza vaccination) every year is the best way to prevent influenza. What are the causes? Influenza is caused by a virus. You can catch the virus by:  Breathing in droplets from an infected person's cough or sneeze.  Touching something that was recently contaminated with the virus and then touching your mouth, nose, or eyes. What increases the risk? The following factors may make you more likely to get the flu:  Not cleaning your hands frequently with soap and water or alcohol-based hand sanitizer.  Having close contact with many people during cold and flu season.  Touching your mouth, eyes, or nose without washing or sanitizing your hands first.  Not drinking enough fluids or not eating a healthy diet.  Not getting enough sleep or exercise.  Being under a high amount of stress.  Not getting a yearly (annual) flu shot. You may be at a higher risk of complications from the flu, such as a severe lung infection (pneumonia), if you:  Are over the age of 56.  Are pregnant.  Have a weakened disease-fighting system (immune system). You may have a weakened immune system if you:  Have HIV or AIDS.  Are undergoing chemotherapy.  Aretaking medicines that reduce the activity of (suppress) the immune system.  Have a long-term (chronic) illness, such as heart disease, kidney disease, diabetes, or lung disease.  Have a liver disorder.  Are obese.  Have anemia. What are the signs or  symptoms? Symptoms of this condition typically last 4-10 days and may include:  Fever.  Chills.  Headache, body aches, or muscle aches.  Sore throat.  Cough.  Runny or congested nose.  Chest discomfort and cough.  Poor appetite.  Weakness or tiredness (fatigue).  Dizziness.  Nausea or vomiting. How is this diagnosed? This condition may be diagnosed based on your medical history and a physical exam. Your health care provider may do a nose or throat swab test to confirm the diagnosis. How is this treated? If influenza is detected early, you can be treated with antiviral medicine that can reduce the length of your illness and the severity of your symptoms. This medicine may be given by mouth (orally) or through an IV tube that is inserted in one of your veins. The goal of treatment is to relieve symptoms by taking care of yourself at home. This may include taking over-the-counter medicines, drinking plenty of fluids, and adding humidity to the air in your home. In some cases, influenza goes away on its own. Severe influenza or complications from influenza may be treated in a hospital. Follow these instructions at home:  Take over-the-counter and prescription medicines only as told by your health care provider.  Use a cool mist humidifier to add humidity to the air in your home. This can make breathing easier.  Rest as needed.  Drink enough fluid to keep your urine clear or pale yellow.  Cover your mouth and nose when you cough or sneeze.  Wash your hands with soap and water often, especially after you cough or sneeze. If soap and water are not available, use hand sanitizer.  Stay home from work or school as told by your health care provider. Unless you are visiting your health care provider, try to avoid leaving home until your fever has been gone for 24 hours without the use of medicine.  Keep all follow-up visits as told by your health care provider. This is  important. How is this prevented?  Getting an annual flu shot is the best way to avoid getting the flu. You may get the flu shot in late summer, fall, or winter. Ask your health care provider when you should get your flu shot.  Wash your hands often or use hand sanitizer often.  Avoid contact with people who are sick during cold and flu season.  Eat a healthy diet, drink plenty of fluids, get enough sleep, and exercise regularly. Contact a health care provider if:  You develop new symptoms.  You have:  Chest pain.  Diarrhea.  A fever.  Your cough gets worse.  You produce more mucus.  You feel nauseous or you vomit. Get help right away if:  You develop shortness of breath or difficulty breathing.  Your skin or nails turn a bluish color.  You have severe pain or stiffness in your neck.  You develop a sudden headache or sudden pain in your face or ear.  You cannot stop vomiting. This information is not intended to replace advice given to you by your health care provider. Make sure you discuss any questions you have with your health care provider. Document Released: 08/09/2000 Document Revised: 01/18/2016 Document Reviewed: 06/06/2015 Else[employer] Interactive Patient Education  2017 [employer].

## 2016-11-21 NOTE — Progress Notes (Signed)
Subjective:    Patient ID: Rebecca Sweeney, female    DOB: 1961/07/28, 56 y.o.   MRN: [id number]  55y/o caucasian married female c/o sore throat, dry cough, chills, mild body aches since last night. Denies n/v/d. Afebrile at this time.  Cares for father in law at home has lung problems/collapsed lung been in and out of hospital.  Last antibiotics in past 6 months doxycycline and amoxicillin per paper occupational medicine chart review paper.  Patient is using singulair, flonase and nasal saline they are helping with allergic rhinitis  Hasn't been sleeping well or eating well due to father in law illness/unable to care for himself living with her.  Sore throat and body aches/fatigue the worst of her symptoms.  Some ear pressure      Review of Systems  Constitutional: Positive for chills, diaphoresis and fatigue. Negative for activity change, appetite change, fever and unexpected weight change.  HENT: Positive for congestion, ear pain, postnasal drip, rhinorrhea and sore throat. Negative for dental problem, drooling, ear discharge, facial swelling, hearing loss, mouth sores, nosebleeds, sinus pain, sinus pressure, sneezing, tinnitus, trouble swallowing and voice change.   Eyes: Positive for itching. Negative for photophobia, pain, discharge, redness and visual disturbance.  Respiratory: Positive for cough. Negative for choking, chest tightness, shortness of breath, wheezing and stridor.   Cardiovascular: Negative for chest pain, palpitations and leg swelling.  Gastrointestinal: Negative for abdominal distention, abdominal pain, blood in stool, constipation, diarrhea, nausea and vomiting.  Endocrine: Negative for cold intolerance and heat intolerance.  Genitourinary: Negative for difficulty urinating, dysuria and hematuria.  Musculoskeletal: Positive for myalgias. Negative for arthralgias, back pain, gait problem, joint swelling, neck pain and neck stiffness.  Skin: Negative for color change, pallor,  rash and wound.  Allergic/Immunologic: Positive for environmental allergies. Negative for food allergies.  Neurological: Positive for headaches. Negative for dizziness, tremors, seizures, syncope, facial asymmetry, speech difficulty, weakness, light-headedness and numbness.  Hematological: Negative for adenopathy. Does not bruise/bleed easily.  Psychiatric/Behavioral: Positive for sleep disturbance. Negative for agitation, behavioral problems and confusion.       Objective:   Physical Exam  Constitutional: She is oriented to person, place, and time. Vital signs are normal. She appears well-developed and well-nourished. She is active and cooperative.  Non-toxic appearance. She does not have a sickly appearance. She appears ill. No distress.  HENT:  Head: Normocephalic and atraumatic.  Right Ear: Hearing, external ear and ear canal normal. A middle ear effusion is present.  Left Ear: Hearing, external ear and ear canal normal. A middle ear effusion is present.  Nose: Mucosal edema and rhinorrhea present. No nose lacerations, sinus tenderness, nasal deformity, septal deviation or nasal septal hematoma. No epistaxis.  No foreign bodies. Right sinus exhibits no maxillary sinus tenderness and no frontal sinus tenderness. Left sinus exhibits no maxillary sinus tenderness and no frontal sinus tenderness.  Mouth/Throat: Uvula is midline and mucous membranes are normal. Mucous membranes are not pale, not dry and not cyanotic. She does not have dentures. No oral lesions. No trismus in the jaw. Normal dentition. No dental abscesses, uvula swelling, lacerations or dental caries. Posterior oropharyngeal edema and posterior oropharyngeal erythema present. No oropharyngeal exudate or tonsillar abscesses.  Cobblestoning posterior pharynx; bilateral TMs air fluid level clear; bilateral allergic shiners; bilateral nasal turbinates edema/erythema clear discharge; oropharynx macular erythema  Eyes: Conjunctivae, EOM  and lids are normal. Pupils are equal, round, and reactive to light. Right eye exhibits no chemosis, no discharge, no exudate and no hordeolum.  No foreign body present in the right eye. Left eye exhibits no chemosis, no discharge, no exudate and no hordeolum. No foreign body present in the left eye. Right conjunctiva is not injected. Right conjunctiva has no hemorrhage. Left conjunctiva is not injected. Left conjunctiva has no hemorrhage. No scleral icterus. Right eye exhibits normal extraocular motion and no nystagmus. Left eye exhibits normal extraocular motion and no nystagmus. Right pupil is round and reactive. Left pupil is round and reactive. Pupils are equal.  Neck: Trachea normal and normal range of motion. Neck supple. No tracheal tenderness, no spinous process tenderness and no muscular tenderness present. No neck rigidity. No tracheal deviation, no edema, no erythema and normal range of motion present. No thyroid mass and no thyromegaly present.  Cardiovascular: Normal rate, regular rhythm, S1 normal, S2 normal, normal heart sounds and intact distal pulses.  PMI is not displaced.  Exam reveals no gallop and no friction rub.   No murmur heard. Pulses:      Radial pulses are 2+ on the right side, and 2+ on the left side.  Pulmonary/Chest: Effort normal and breath sounds normal. No accessory muscle usage or stridor. No respiratory distress. She has no decreased breath sounds. She has no wheezes. She has no rhonchi. She has no rales. She exhibits no tenderness.  Speaks full sentences no cough observed  Abdominal: Soft. Normal appearance. She exhibits no distension.  Musculoskeletal: Normal range of motion. She exhibits no edema or tenderness.       Right shoulder: Normal.       Left shoulder: Normal.       Right hip: Normal.       Left hip: Normal.       Right knee: Normal.       Left knee: Normal.       Cervical back: Normal.       Right hand: Normal.       Left hand: Normal.   Lymphadenopathy:       Head (right side): No submental, no submandibular, no tonsillar, no preauricular, no posterior auricular and no occipital adenopathy present.       Head (left side): No submental, no submandibular, no tonsillar, no preauricular, no posterior auricular and no occipital adenopathy present.    She has no cervical adenopathy.       Right cervical: No superficial cervical, no deep cervical and no posterior cervical adenopathy present.      Left cervical: No superficial cervical, no deep cervical and no posterior cervical adenopathy present.  Neurological: She is alert and oriented to person, place, and time. She has normal strength. She is not disoriented. She displays no atrophy and no tremor. No cranial nerve deficit or sensory deficit. She exhibits normal muscle tone. She displays no seizure activity. Coordination and gait normal. GCS eye subscore is 4. GCS verbal subscore is 5. GCS motor subscore is 6.  On off exam table without difficulty; bilateral extremities equal strength 5/5  Skin: Skin is warm and intact. No abrasion, no bruising, no burn, no ecchymosis, no laceration, no lesion, no petechiae and no rash noted. She is diaphoretic. No cyanosis or erythema. No pallor. Nails show no clubbing.  Skin face moist warm  Psychiatric: She has a normal mood and affect. Her speech is normal and behavior is normal. Judgment and thought content normal. Cognition and memory are normal.  Nursing note and vitals reviewed.         Assessment & Plan:  A-influenza like illness,  otitis media effusion bilaterally; seasonal allergic rhinitis  P-Electronic Rx tamiflu 75mg  po BID x 5 days #10 RF0.  Hydrate hydrate hydrate.if unable to maintain po intake or urine orange/brown/unable to void every 8 hours follow up with PCM/UCC/ER for re-evaluation. Tylenol 1000mg  po q6h prn pain/fever.  Honey with lemon.  Community with upsurge influenza B and this is 4th employee seen today with influenza  like symptoms.  Suspect Viral illness: no evidence of invasive bacterial infection, non toxic and well hydrated.  This is most likely self limiting viral infection.  I do not see where any further testing or imaging is necessary at this time.   I will suggest supportive care, rest, good hygiene and encourage the patient to take adequate fluids.  Does not require work excuse. Patient to talk with supervisor discussed recommend staying home until afebrile 24 hours off tylenol. nasal saline 1-2 sprays each nostril prn q2h,.  Continue singulair 10mg  po daily, flonase 1 spray each nostril BID, nasal saline 2 spray  Each nostril q2h wa, shower BID. Discussed honey with lemon and salt water gargles for comfort also.  The patient is to return to clinic or EMERGENCY ROOM if symptoms worsen or change significantly e.g. Hemoptysis, Dyspnea, dysphagia, vomiting, lethargy, SOB, wheezing. Patient verbalized agreement and understanding of treatment plan and had no further questions at this time.  Patient may use normal saline nasal spray as needed.  Consider starting claritin 10mg  po daiily as increased pollen counts with spring bloom.  Continue singulair, nasal saline and flonase.  Avoid triggers if possible.  Shower prior to bedtime if exposed to triggers.  If allergic dust/dust mites recommend mattress/pillow covers/encasements; washing linens, vacuuming, sweeping, dusting weekly.  Call or return to clinic as needed if these symptoms worsen or fail to improve as anticipated.   Exitcare handout on allergic rhinitis given to patient.  Patient verbalized understanding of instructions, agreed with plan of care and had no further questions at this time.  P2:  Avoidance and hand washing.  Usually no specific medical treatment is needed if a virus is causing the sore throat.  The throat most often gets better on its own within 5 to 7 days.  Antibiotic medicine does not cure viral pharyngitis.  Patient given azithromycin Rx from  [facility] in case worsening sore throat despite claritin, singulair, flonase and nasal saline use x 48 hours and shower BID.  Azithromycin 250mg  take 2 tabs day 1 then 1 tab po daily days 2-5 #6 RF0. For acute pharyngitis caused by bacteria, your healthcare provider will prescribe an antibiotic.  Marland Kitchen Do not smoke.  Marland Kitchen Avoid secondhand smoke and other air pollutants.  . Use a cool mist humidifier to add moisture to the air.  . Get plenty of rest.  . You may want to rest your throat by talking less and eating a diet that is mostly liquid or soft for a day or two.   Marland Kitchen Nonprescription throat lozenges and mouthwashes should help relieve the soreness.   . Gargling with warm saltwater and drinking warm liquids may help.  (You can make a saltwater solution by adding 1/4 teaspoon of salt to 8 ounces, or 240 mL, of warm water.)  . A nonprescription pain reliever such as aspirin, acetaminophen, or ibuprofen may ease general aches and pains.   FOLLOW UP with clinic provider if no improvements in the next 7-10 days.  Patient verbalized understanding of instructions and agreed with plan of care. P2:  Hand washing and diet.  Given azithromycin from [facility] due to frail father in law at home and patient not eating/sleeping well either at risk to progress from viral to bacterial. Patient to start if SOB/dyspnea/chest pain/opaque productive sputum cough and/or worsening sore throat or sinus pain/pressure. Bronchitis simple, community acquired, may have started as viral (probably respiratory syncytial, parainfluenza, influenza, or adenovirus), but now evidence of acute purulent bronchitis with resultant bronchial edema and mucus formation.  Viruses are the most common cause of bronchial inflammation in otherwise healthy adults with acute bronchitis.  The appearance of sputum is not predictive of whether a bacterial infection is present.  Purulent sputum is most often caused by viral infections.  There are a small portion of those  caused by non-viral agents being Mycoplama pneumonia.  Microscopic examination or C&S of sputum in the healthy adult with acute bronchitis is generally not helpful (usually negative or normal respiratory flora) other considerations being cough from upper respiratory tract infections, sinusitis or allergic syndromes (mild asthma or viral pneumonia).  Differential Diagnoses:  reactive airway disease (asthma, allergic aspergillosis (eosinophilia), chronic bronchitis, respiratory infection (sinusitis, common cold, pneumonia), congestive heart failure, reflux esophagitis, bronchogenic tumor, aspiration syndromes and/or exposure to pulmonary irritants/smoke.Without high fever, severe dyspnea, lack of physical findings or other risk factors, I will hold on a chest radiograph and CBC at this time.  I discussed that approximately 50% of patients with acute bronchitis have a cough that lasts up to three weeks, and 25% for over a month.  Tylenol, one to two tablets every four hours as needed for fever or myalgias.  No aspirin.  Patient instructed to follow up in one week or sooner if symptoms worsen.  Patient verbalized agreement and understanding of treatment plan.  P2:  hand washing and cover cough

## 2016-12-10 ENCOUNTER — Ambulatory Visit: Payer: Self-pay | Admitting: Registered Nurse

## 2016-12-10 DIAGNOSIS — J320 Chronic maxillary sinusitis: Secondary | ICD-10-CM

## 2016-12-10 DIAGNOSIS — H6593 Unspecified nonsuppurative otitis media, bilateral: Secondary | ICD-10-CM | POA: Insufficient documentation

## 2016-12-10 DIAGNOSIS — J329 Chronic sinusitis, unspecified: Secondary | ICD-10-CM | POA: Insufficient documentation

## 2016-12-10 MED ORDER — PREDNISONE 10 MG PO TABS: 20.0000 mg | ORAL_TABLET | Freq: Every day | ORAL | 0 refills | Status: AC

## 2016-12-10 MED ORDER — PHENYLEPHRINE HCL 5 MG PO TABS: 5.0000 mg | ORAL_TABLET | Freq: Four times a day (QID) | ORAL | 0 refills | Status: AC | PRN

## 2016-12-10 MED ORDER — SALINE SPRAY 0.65 % NA SOLN: 2.0000 | NASAL | 0 refills | Status: DC

## 2016-12-10 NOTE — Patient Instructions (Signed)
Continue montelukast  by mouth at bedtime Nasal saline 2 sprays each nostril every 2hours as needed congestion Shower twice a day claritin  by mouth daily Phenylephrine  by mouth every 6 hours as needed runny nose Prednisone  (2  tabs) with breakfast x 10 days Honey with lemon Hydrate Tylenol  by mouth every 6 hours as needed for pain/fever Start women's multivitamin with zinc and B12/iron by mouth daily Will check zinc, B12, Vitamin D levels at next be well lab draw Recommend meat/fish at least one meal a day  Consider mask when outside or exposed to a lot of dust Follow up Thursday for re-evaluation  Otitis Media With Effusion, Pediatric Otitis media with effusion (OME) occurs when there is inflammation of the middle ear and fluid in the middle ear space. There are no signs and symptoms of infection. The middle ear space contains air and the bones for hearing. Air in the middle ear space helps to transmit sound to the brain. OME is a common condition in children, and it often occurs after an ear infection. This condition may be present for several weeks or longer after an ear infection. Most cases of this condition get better on their own. What are the causes? OME is caused by a blockage of the eustachian tube in one or both ears. These tubes drain fluid in the ears to the back of the nose (nasopharynx). If the tissue in the tube swells up (edema), the tube closes. This prevents fluid from draining. Blockage can be caused by:  Ear infections.  Colds and other upper respiratory infections.  Allergies.  Irritants, such as tobacco smoke.  Enlarged adenoids. The adenoids are areas of soft tissue located high in the back of the throat, behind the nose and the roof of the mouth. They are part of the body's natural defense (immune) system.  A mass in the nasopharynx.  Damage to the ear caused by pressure changes (barotrauma). What increases the risk? Your child  is more likely to develop this condition if:  He or she has repeated ear and sinus infections.  He or she has allergies.  He or she is exposed to tobacco smoke.  He or she attends daycare.  He or she is not breastfed. What are the signs or symptoms? Symptoms of this condition may not be obvious. Sometimes this condition does not have any symptoms, or symptoms may overlap with those of a cold or upper respiratory tract illness. Symptoms of this condition include:  Temporary hearing loss.  A feeling of fullness in the ear without pain.  Irritability or agitation.  Balance (vestibular) problems. As a result of hearing loss, your child may:  Listen to the TV at a loud volume.  Not respond to questions.  Ask "What?" often when spoken to.  Mistake or confuse one sound or word for another.  Perform poorly at school.  Have a poor attention span.  Become agitated or irritated easily. How is this diagnosed? This condition is diagnosed with an ear exam. Your child's health care provider will look inside your child's ear with an instrument (otoscope) to check for redness, swelling, and fluid. Other tests may be done, including:  A test to check the movement of the eardrum (pneumatic otoscopy). This is done by squeezing a small amount of air into the ear.  A test that changes air pressure in the middle ear to check how well the eardrum moves and to see if the eustachian tube is working (  tympanogram).  Hearing test (audiogram). This test involves playing tones at different pitches to see if your child can hear each tone. How is this treated? Treatment for this condition depends on the cause. In many cases, the fluid goes away on its own. In some cases, your child may need a procedure to create a hole in the eardrum to allow fluid to drain (myringotomy) and to insert small drainage tubes (tympanostomy tubes) into the eardrums. These tubes help to drain fluid and prevent infection.  This procedure may be recommended if:  OME does not get better over several months.  Your child has many ear infections within several months.  Your child has noticeable hearing loss.  Your child has problems with speech and language development. Surgery may also be done to remove the adenoids (adenoidectomy). Follow these instructions at home:  Give over-the-counter and prescription medicines only as told by your child's health care provider.  Keep children away from any tobacco smoke.  Keep all follow-up visits as told by your child's health care provider. This is important. How is this prevented?  Keep your child's vaccinations up to date. Make sure your child gets all recommended vaccinations, including a pneumonia and flu vaccine.  Encourage hand washing. Your child should wash his or her hands often with soap and water. If there is no soap and water, he or she should use hand sanitizer.  Avoid exposing your child to tobacco smoke.  Breastfeed your baby, if possible. Babies who are breastfed as long as possible are less likely to develop this condition. Contact a health care provider if:  Your child's hearing does not get better after 3 months.  Your child's hearing is worse.  Your child has ear pain.  Your child has a fever.  Your child has drainage from the ear.  Your child is dizzy.  Your child has a lump on his or her neck. Get help right away if:  Your child has bleeding from the nose.  Your child cannot move part of her or his face.  Your child has trouble breathing.  Your child cannot smell.  Your child develops severe congestion.  Your child develops weakness.  Your child who is younger than 3 months has a temperature of 100F (38C) or higher. Summary  Otitis media with effusion (OME) occurs when there is inflammation of the middle ear and fluid in the middle ear space.  This condition is caused by blockage of one or both eustachian tubes,  which drain fluid in the ears to the back of the nose.  Symptoms of this condition can include temporary hearing loss, a feeling of fullness in the ear, irritability or agitation, and balance (vestibular) problems. Sometimes, there are no symptoms.  This condition is diagnosed with an ear exam and tests, such as pneumatic otoscopy, tympanogram, and audiogram.  Treatment for this condition depends on the cause. In many cases, the fluid goes away on its own. This information is not intended to replace advice given to you by your health care provider. Make sure you discuss any questions you have with your health care provider. Document Released: 11/02/2003 Document Revised: 07/04/2016 Document Reviewed: 07/04/2016 Elsevier Interactive Patient Education  2017 [employer] Pharyngitis Pharyngitis is redness, pain, and swelling (inflammation) of your pharynx. What are the causes? Pharyngitis is usually caused by infection. Most of the time, these infections are from viruses (viral) and are part of a cold. However, sometimes pharyngitis is caused by bacteria (bacterial). Pharyngitis can also  be caused by allergies. Viral pharyngitis may be spread from person to person by coughing, sneezing, and personal items or utensils (cups, forks, spoons, toothbrushes). Bacterial pharyngitis may be spread from person to person by more intimate contact, such as kissing. What are the signs or symptoms? Symptoms of pharyngitis include:  Sore throat.  Tiredness (fatigue).  Low-grade fever.  Headache.  Joint pain and muscle aches.  Skin rashes.  Swollen lymph nodes.  Plaque-like film on throat or tonsils (often seen with bacterial pharyngitis). How is this diagnosed? Your health care provider will ask you questions about your illness and your symptoms. Your medical history, along with a physical exam, is often all that is needed to diagnose pharyngitis. Sometimes, a rapid strep test is done. Other lab  tests may also be done, depending on the suspected cause. How is this treated? Viral pharyngitis will usually get better in 3-4 days without the use of medicine. Bacterial pharyngitis is treated with medicines that kill germs (antibiotics). Follow these instructions at home:  Drink enough water and fluids to keep your urine clear or pale yellow.  Only take over-the-counter or prescription medicines as directed by your health care provider:  If you are prescribed antibiotics, make sure you finish them even if you start to feel better.  Do not take aspirin.  Get lots of rest.  Gargle with 8 oz of salt water ( tsp of salt per 1 qt of water) as often as every 1-2 hours to soothe your throat.  Throat lozenges (if you are not at risk for choking) or sprays may be used to soothe your throat. Contact a health care provider if:  You have large, tender lumps in your neck.  You have a rash.  You cough up green, yellow-brown, or bloody spit. Get help right away if:  Your neck becomes stiff.  You drool or are unable to swallow liquids.  You vomit or are unable to keep medicines or liquids down.  You have severe pain that does not go away with the use of recommended medicines.  You have trouble breathing (not caused by a stuffy nose). This information is not intended to replace advice given to you by your health care provider. Make sure you discuss any questions you have with your health care provider. Document Released: 08/12/2005 Document Revised: 01/18/2016 Document Reviewed: 04/19/2013 Elsevier Interactive Patient Education  2017 [employer] Sinusitis, Adult Sinusitis is soreness and inflammation of your sinuses. Sinuses are hollow spaces in the bones around your face. Your sinuses are located:  Around your eyes.  In the middle of your forehead.  Behind your nose.  In your cheekbones. Your sinuses and nasal passages are lined with a stringy fluid (mucus). Mucus normally  drains out of your sinuses. When your nasal tissues become inflamed or swollen, the mucus can become trapped or blocked so air cannot flow through your sinuses. This allows bacteria, viruses, and funguses to grow, which leads to infection. Sinusitis can develop quickly and last for 7?10 days (acute) or for more than 12 weeks (chronic). Sinusitis often develops after a cold. What are the causes? This condition is caused by anything that creates swelling in the sinuses or stops mucus from draining, including:  Allergies.  Asthma.  Bacterial or viral infection.  Abnormally shaped bones between the nasal passages.  Nasal growths that contain mucus (nasal polyps).  Narrow sinus openings.  Pollutants, such as chemicals or irritants in the air.  A foreign object stuck in the nose.  A fungal infection. This is rare. What increases the risk? The following factors may make you more likely to develop this condition:  Having allergies or asthma.  Having had a recent cold or respiratory tract infection.  Having structural deformities or blockages in your nose or sinuses.  Having a weak immune system.  Doing a lot of swimming or diving.  Overusing nasal sprays.  Smoking. What are the signs or symptoms? The main symptoms of this condition are pain and a feeling of pressure around the affected sinuses. Other symptoms include:  Upper toothache.  Earache.  Headache.  Bad breath.  Decreased sense of smell and taste.  A cough that may get worse at night.  Fatigue.  Fever.  Thick drainage from your nose. The drainage is often green and it may contain pus (purulent).  Stuffy nose or congestion.  Postnasal drip. This is when extra mucus collects in the throat or back of the nose.  Swelling and warmth over the affected sinuses.  Sore throat.  Sensitivity to light. How is this diagnosed? This condition is diagnosed based on symptoms, a medical history, and a physical exam.  To find out if your condition is acute or chronic, your health care provider may:  Look in your nose for signs of nasal polyps.  Tap over the affected sinus to check for signs of infection.  View the inside of your sinuses using an imaging device that has a light attached (endoscope). If your health care provider suspects that you have chronic sinusitis, you may also:  Be tested for allergies.  Have a sample of mucus taken from your nose (nasal culture) and checked for bacteria.  Have a mucus sample examined to see if your sinusitis is related to an allergy. If your sinusitis does not respond to treatment and it lasts longer than 8 weeks, you may have an MRI or CT scan to check your sinuses. These scans also help to determine how severe your infection is. In rare cases, a bone biopsy may be done to rule out more serious types of fungal sinus disease. How is this treated? Treatment for sinusitis depends on the cause and whether your condition is chronic or acute. If a virus is causing your sinusitis, your symptoms will go away on their own within 10 days. You may be given medicines to relieve your symptoms, including:  Topical nasal decongestants. They shrink swollen nasal passages and let mucus drain from your sinuses.  Antihistamines. These drugs block inflammation that is triggered by allergies. This can help to ease swelling in your nose and sinuses.  Topical nasal corticosteroids. These are nasal sprays that ease inflammation and swelling in your nose and sinuses.  Nasal saline washes. These rinses can help to get rid of thick mucus in your nose. If your condition is caused by bacteria, you will be given an antibiotic medicine. If your condition is caused by a fungus, you will be given an antifungal medicine. Surgery may be needed to correct underlying conditions, such as narrow nasal passages. Surgery may also be needed to remove polyps. Follow these instructions at home: Medicines     Take, use, or apply over-the-counter and prescription medicines only as told by your health care provider. These may include nasal sprays.  If you were prescribed an antibiotic medicine, take it as told by your health care provider. Do not stop taking the antibiotic even if you start to feel better. Hydrate and Humidify   Drink enough water to keep  your urine clear or pale yellow. Staying hydrated will help to thin your mucus.  Use a cool mist humidifier to keep the humidity level in your home above 50%.  Inhale steam for 10-15 minutes, 3-4 times a day or as told by your health care provider. You can do this in the bathroom while a hot shower is running.  Limit your exposure to cool or dry air. Rest   Rest as much as possible.  Sleep with your head raised (elevated).  Make sure to get enough sleep each night. General instructions   Apply a warm, moist washcloth to your face 3-4 times a day or as told by your health care provider. This will help with discomfort.  Wash your hands often with soap and water to reduce your exposure to viruses and other germs. If soap and water are not available, use hand sanitizer.  Do not smoke. Avoid being around people who are smoking (secondhand smoke).  Keep all follow-up visits as told by your health care provider. This is important. Contact a health care provider if:  You have a fever.  Your symptoms get worse.  Your symptoms do not improve within 10 days. Get help right away if:  You have a severe headache.  You have persistent vomiting.  You have pain or swelling around your face or eyes.  You have vision problems.  You develop confusion.  Your neck is stiff.  You have trouble breathing. This information is not intended to replace advice given to you by your health care provider. Make sure you discuss any questions you have with your health care provider. Document Released: 08/12/2005 Document Revised: 04/07/2016 Document  Reviewed: 06/07/2015 Elsevier Interactive Patient Education  2017 [employer] Allergic Rhinitis Allergic rhinitis is when the mucous membranes in the nose respond to allergens. Allergens are particles in the air that cause your body to have an allergic reaction. This causes you to release allergic antibodies. Through a chain of events, these eventually cause you to release histamine into the blood stream. Although meant to protect the body, it is this release of histamine that causes your discomfort, such as frequent sneezing, congestion, and an itchy, runny nose. What are the causes? Seasonal allergic rhinitis (hay fever) is caused by pollen allergens that may come from grasses, trees, and weeds. Year-round allergic rhinitis (perennial allergic rhinitis) is caused by allergens such as house dust mites, pet dander, and mold spores. What are the signs or symptoms?  Nasal stuffiness (congestion).  Itchy, runny nose with sneezing and tearing of the eyes. How is this diagnosed? Your health care provider can help you determine the allergen or allergens that trigger your symptoms. If you and your health care provider are unable to determine the allergen, skin or blood testing may be used. Your health care provider will diagnose your condition after taking your health history and performing a physical exam. Your health care provider may assess you for other related conditions, such as asthma, pink eye, or an ear infection. How is this treated? Allergic rhinitis does not have a cure, but it can be controlled by:  Medicines that block allergy symptoms. These may include allergy shots, nasal sprays, and oral antihistamines.  Avoiding the allergen. Hay fever may often be treated with antihistamines in pill or nasal spray forms. Antihistamines block the effects of histamine. There are over-the-counter medicines that may help with nasal congestion and swelling around the eyes. Check with your health care  provider before taking or giving  this medicine. If avoiding the allergen or the medicine prescribed do not work, there are many new medicines your health care provider can prescribe. Stronger medicine may be used if initial measures are ineffective. Desensitizing injections can be used if medicine and avoidance does not work. Desensitization is when a patient is given ongoing shots until the body becomes less sensitive to the allergen. Make sure you follow up with your health care provider if problems continue. Follow these instructions at home: It is not possible to completely avoid allergens, but you can reduce your symptoms by taking steps to limit your exposure to them. It helps to know exactly what you are allergic to so that you can avoid your specific triggers. Contact a health care provider if:  You have a fever.  You develop a cough that does not stop easily (persistent).  You have shortness of breath.  You start wheezing.  Symptoms interfere with normal daily activities. This information is not intended to replace advice given to you by your health care provider. Make sure you discuss any questions you have with your health care provider. Document Released: 05/07/2001 Document Revised: 04/12/2016 Document Reviewed: 04/19/2013 [employer] Interactive Patient Education  2017 [employer].

## 2016-12-10 NOTE — Progress Notes (Signed)
Subjective:    Patient ID: Rebecca Sweeney, female    DOB: 1961-05-11, 56 y.o.   MRN: [id number]  55y/o caucasian married female here for evaluation of sore throat, productive cough with green mucus since yesterday. Has used only Ibuprofen at home in addition to her Claritin, Singulair and Flonase. Denies sinus/nasal pain/pressure.  Has been using nasal saline in shower but needs refill. Migraine recurrent in past week right parietal.   Fatigue worried that the last time throat started to hurt she got really sick and couldn't work for a couple days.  Patient reported she doesn't eat much meat, prefers fish.  Denied teeth pain, ear discharge, ear pain, hemoptysis, rash, hand/feet/ankle swelling, nausea, vomiting, diarrhea, abdomen pain, body aches, sick contacts      Review of Systems  Constitutional: Positive for fatigue. Negative for activity change, appetite change, chills, diaphoresis, fever and unexpected weight change.  HENT: Positive for congestion, postnasal drip, rhinorrhea, sinus pain, sinus pressure and sore throat. Negative for dental problem, drooling, ear discharge, ear pain, facial swelling, hearing loss, mouth sores, nosebleeds, sneezing, tinnitus, trouble swallowing and voice change.   Eyes: Negative for photophobia, pain, discharge, redness, itching and visual disturbance.  Respiratory: Positive for cough. Negative for choking, chest tightness, shortness of breath, wheezing and stridor.   Cardiovascular: Negative for chest pain, palpitations and leg swelling.  Gastrointestinal: Negative for abdominal distention, abdominal pain, blood in stool, constipation, diarrhea, nausea and vomiting.  Endocrine: Negative for cold intolerance and heat intolerance.  Genitourinary: Negative for difficulty urinating, dysuria and hematuria.  Musculoskeletal: Negative for arthralgias, back pain, gait problem, joint swelling, myalgias, neck pain and neck stiffness.  Skin: Negative for color change,  pallor, rash and wound.  Allergic/Immunologic: Positive for environmental allergies. Negative for food allergies.  Neurological: Positive for headaches. Negative for dizziness, tremors, seizures, syncope, facial asymmetry, speech difficulty, weakness, light-headedness and numbness.  Hematological: Negative for adenopathy. Does not bruise/bleed easily.  Psychiatric/Behavioral: Negative for agitation, behavioral problems, confusion and sleep disturbance.       Objective:   Physical Exam  Constitutional: She is oriented to person, place, and time. Vital signs are normal. She appears well-developed and well-nourished. She is active and cooperative.  Non-toxic appearance. She does not have a sickly appearance. She appears ill. No distress.  HENT:  Head: Normocephalic and atraumatic.  Right Ear: Hearing, external ear and ear canal normal. Tympanic membrane is bulging. A middle ear effusion is present.  Left Ear: Hearing, external ear and ear canal normal. Tympanic membrane is erythematous and bulging. A middle ear effusion is present.  Nose: Mucosal edema and rhinorrhea present. No nose lacerations, sinus tenderness, nasal deformity, septal deviation or nasal septal hematoma. No epistaxis.  No foreign bodies. Right sinus exhibits maxillary sinus tenderness. Right sinus exhibits no frontal sinus tenderness. Left sinus exhibits maxillary sinus tenderness. Left sinus exhibits no frontal sinus tenderness.  Mouth/Throat: Uvula is midline and mucous membranes are normal. Mucous membranes are not pale, not dry and not cyanotic. She does not have dentures. No oral lesions. No trismus in the jaw. Normal dentition. No dental abscesses, uvula swelling, lacerations or dental caries. Posterior oropharyngeal edema and posterior oropharyngeal erythema present. No oropharyngeal exudate or tonsillar abscesses.  Cobblestoning posterior pharynx; bilateral TMS air fluid level clear bulging; left TM scant erythema centrally;  bilateral nasal turbinates edema erythema clear discharge; bilateral allergic shiners; bilateral maxillary sinuses mildly tender to palpation bilaterally  Eyes: Conjunctivae, EOM and lids are normal. Pupils are equal, round, and  reactive to light. Right eye exhibits no chemosis, no discharge, no exudate and no hordeolum. No foreign body present in the right eye. Left eye exhibits no chemosis, no discharge, no exudate and no hordeolum. No foreign body present in the left eye. Right conjunctiva is not injected. Right conjunctiva has no hemorrhage. Left conjunctiva is not injected. Left conjunctiva has no hemorrhage. No scleral icterus. Right eye exhibits normal extraocular motion and no nystagmus. Left eye exhibits normal extraocular motion and no nystagmus. Right pupil is round and reactive. Left pupil is round and reactive. Pupils are equal.  Neck: Trachea normal and normal range of motion. Neck supple. No tracheal tenderness, no spinous process tenderness and no muscular tenderness present. No neck rigidity. No tracheal deviation, no edema, no erythema and normal range of motion present. No thyroid mass and no thyromegaly present.  Cardiovascular: Normal rate, regular rhythm, S1 normal, S2 normal, normal heart sounds and intact distal pulses.  PMI is not displaced.  Exam reveals no gallop and no friction rub.   No murmur heard. Pulmonary/Chest: Effort normal and breath sounds normal. No accessory muscle usage or stridor. No respiratory distress. She has no decreased breath sounds. She has no wheezes. She has no rhonchi. She has no rales. She exhibits no tenderness.  Speaks full sentences without difficulty no cough observed in exam room  Abdominal: Soft. Normal appearance. She exhibits no distension. There is no rigidity and no guarding. Hernia confirmed negative in the ventral area.  Musculoskeletal: Normal range of motion. She exhibits no edema, tenderness or deformity.       Right shoulder: Normal.        Left shoulder: Normal.       Right hip: Normal.       Left hip: Normal.       Right knee: Normal.       Left knee: Normal.       Cervical back: Normal.       Right hand: Normal.       Left hand: Normal.  Lymphadenopathy:       Head (right side): No submental, no submandibular, no tonsillar, no preauricular, no posterior auricular and no occipital adenopathy present.       Head (left side): No submental, no submandibular, no tonsillar, no preauricular, no posterior auricular and no occipital adenopathy present.    She has no cervical adenopathy.       Right cervical: No superficial cervical, no deep cervical and no posterior cervical adenopathy present.      Left cervical: No superficial cervical, no deep cervical and no posterior cervical adenopathy present.  Neurological: She is alert and oriented to person, place, and time. She has normal strength. She is not disoriented. She displays no atrophy and no tremor. No cranial nerve deficit or sensory deficit. She exhibits normal muscle tone. She displays no seizure activity. Coordination and gait normal. GCS eye subscore is 4. GCS verbal subscore is 5. GCS motor subscore is 6.  Skin: Skin is warm, dry and intact. No abrasion, no bruising, no burn, no ecchymosis, no laceration, no lesion, no petechiae and no rash noted. She is not diaphoretic. No cyanosis or erythema. No pallor. Nails show no clubbing.  Psychiatric: She has a normal mood and affect. Her speech is normal and behavior is normal. Judgment and thought content normal. Cognition and memory are normal.  Nursing note and vitals reviewed.         Assessment & Plan:  A-chronic rhinosinusitis; seasonal allergic  rhinitis; bilateral otitis media effusion  P-Dispensed prednisone  Take 2 tabs po daily x 10 days #21 RF0 from [facility] for chronic rhinosinusitis per up to date.  Continue nasal saline aggressive in shower use and 2 sprays each nostril q2h wa #1 RF0 dispensed from clinic stock.   Phenylephine  po q6h prn rhinitis #4 dispensed from clinic stock.  Continue claritin  po daily and singulair  po daily and flonase 1 spray each nostril BID.  Will recheck on Thursday.  Has had azithromycin Mar 2018 and doxycycline Feb 2018.  Sp02 baseline.  No evidence of systemic bacterial infection, non toxic and well hydrated.  I do not see where any further testing or imaging is necessary at this time.   I will suggest supportive care, rest, good hygiene and encourage the patient to take adequate fluids.  The patient is to return to clinic or EMERGENCY ROOM if symptoms worsen or change significantly.  Exitcare handout on sinusitis given to patient.  Patient verbalized agreement and understanding of treatment plan and had no further questions at this time.   P2:  Hand washing and cover cough  Supportive treatment.   No evidence of invasive bacterial infection, non toxic and well hydrated.  This is most likely self limiting viral infection.  I do not see where any further testing or imaging is necessary at this time.   I will suggest supportive care, rest, good hygiene and encourage the patient to take adequate fluids.  The patient is to return to clinic or EMERGENCY ROOM if symptoms worsen or change significantly e.g. ear pain, fever, purulent discharge from ears or bleeding.  Exitcare handout on otitis media with effusion given to patient.  Patient verbalized agreement and understanding of treatment plan.    Patient may use normal saline nasal spray as needed.  Consider adding inhaled antihistamine.  Continue singulair  po daily, claritin  po daily and flonase 1 spray each nostril BID.  Avoid triggers if possible.  Shower prior to bedtime if exposed to triggers.  If allergic dust/dust mites recommend mattress/pillow covers/encasements; washing linens, vacuuming, sweeping, dusting weekly.  Call or return to clinic as needed if these symptoms worsen or fail to improve as anticipated.    Exitcare handout on allergic rhinitis given to patient.  Patient verbalized understanding of instructions, agreed with plan of care and had no further questions at this time.  P2:  Avoidance and hand washing.

## 2016-12-12 ENCOUNTER — Ambulatory Visit: Payer: Self-pay | Admitting: Registered Nurse

## 2016-12-12 VITALS — BP 109/81 | HR 73 | Temp 97.6°F

## 2016-12-12 DIAGNOSIS — J029 Acute pharyngitis, unspecified: Secondary | ICD-10-CM

## 2016-12-12 DIAGNOSIS — J209 Acute bronchitis, unspecified: Secondary | ICD-10-CM

## 2016-12-12 DIAGNOSIS — H6593 Unspecified nonsuppurative otitis media, bilateral: Secondary | ICD-10-CM

## 2016-12-12 MED ORDER — ALBUTEROL SULFATE HFA 108 (90 BASE) MCG/ACT IN AERS: 1.0000 | INHALATION_SPRAY | RESPIRATORY_TRACT | 0 refills | Status: DC | PRN

## 2016-12-12 MED ORDER — DOXYCYCLINE HYCLATE 100 MG PO TABS
100.0000 mg | ORAL_TABLET | Freq: Two times a day (BID) | ORAL | 0 refills | Status: DC
Start: 2016-12-12 — End: 2017-04-10

## 2016-12-12 NOTE — Patient Instructions (Addendum)
Continue prednisone  by moth daily with breakfast Albuterol MDI 1-2 puffs by mouth every 4-6 hours as needed for protracted coughing/wheezing/chest tightness Doxycycline  by mouth every 12 hours x 10 days Wear sunscreen/protective clothing hat x 2 weeks as increased risk sunburn with doxycycline and take with food as stomach upset common Follow up re-evaluation if coughing up blood, fever greater than 100.5 worsening cough, difficulty breathing/shortness of breath Stop smoking consider free classes at [facility] or [employer] program through work insurance (online or work book) Pensions consultant daily multivitamin with B12 and Set designer twice a day Continue flonase 1 spray each nostril twice a day Continue nasal saline 2 sprays each nostril every 2 hours as needed for congestion Continue singulair  by mouth daily Continue claritin  by mouth daily   Otitis Media, Adult Otitis media occurs when there is inflammation and fluid in the middle ear. Your middle ear is a part of the ear that contains bones for hearing as well as air that helps send sounds to your brain. What are the causes? This condition is caused by a blockage in the eustachian tube. This tube drains fluid from the ear to the back of the nose (nasopharynx). A blockage in this tube can be caused by an object or by swelling (edema) in the tube. Problems that can cause a blockage include:  A cold or other upper respiratory infection.  Allergies.  An irritant, such as tobacco smoke.  Enlarged adenoids. The adenoids are areas of soft tissue located high in the back of the throat, behind the nose and the roof of the mouth.  A mass in the nasopharynx.  Damage to the ear caused by pressure changes (barotrauma). What are the signs or symptoms? Symptoms of this condition include:  Ear pain.  A fever.  Decreased hearing.  A headache.  Tiredness (lethargy).  Fluid leaking from the ear.  Ringing in the  ear. How is this diagnosed? This condition is diagnosed with a physical exam. During the exam your health care provider will use an instrument called an otoscope to look into your ear and check for redness, swelling, and fluid. He or she will also ask about your symptoms. Your health care provider may also order tests, such as:  A test to check the movement of the eardrum (pneumatic otoscopy). This test is done by squeezing a small amount of air into the ear.  A test that changes air pressure in the middle ear to check how well the eardrum moves and whether the eustachian tube is working (tympanogram). How is this treated? This condition usually goes away on its own within 3-5 days. But if the condition is caused by a bacteria infection and does not go away own its own, or keeps coming back, your health care provider may:  Prescribe antibiotic medicines to treat the infection.  Prescribe or recommend medicines to control pain. Follow these instructions at home:  Take over-the-counter and prescription medicines only as told by your health care provider.  If you were prescribed an antibiotic medicine, take it as told by your health care provider. Do not stop taking the antibiotic even if you start to feel better.  Keep all follow-up visits as told by your health care provider. This is important. Contact a health care provider if:  You have bleeding from your nose.  There is a lump on your neck.  You are not getting better in 5 days.  You feel worse instead of better. Get  help right away if:  You have severe pain that is not controlled with medicine.  You have swelling, redness, or pain around your ear.  You have stiffness in your neck.  A part of your face is paralyzed.  The bone behind your ear (mastoid) is tender when you touch it.  You develop a severe headache. Summary  Otitis media is redness, soreness, and swelling of the middle ear.  This condition usually goes  away on its own within 3-5 days.  If the problem does not go away in 3-5 days, your health care provider may prescribe or recommend medicines to treat your symptoms.  If you were prescribed an antibiotic medicine, take it as told by your health care provider. This information is not intended to replace advice given to you by your health care provider. Make sure you discuss any questions you have with your health care provider. Document Released: 05/17/2004 Document Revised: 08/02/2016 Document Reviewed: 08/02/2016 Elsevier Interactive Patient Education  2017 [employer] Pharyngitis Pharyngitis is redness, pain, and swelling (inflammation) of your pharynx. What are the causes? Pharyngitis is usually caused by infection. Most of the time, these infections are from viruses (viral) and are part of a cold. However, sometimes pharyngitis is caused by bacteria (bacterial). Pharyngitis can also be caused by allergies. Viral pharyngitis may be spread from person to person by coughing, sneezing, and personal items or utensils (cups, forks, spoons, toothbrushes). Bacterial pharyngitis may be spread from person to person by more intimate contact, such as kissing. What are the signs or symptoms? Symptoms of pharyngitis include:  Sore throat.  Tiredness (fatigue).  Low-grade fever.  Headache.  Joint pain and muscle aches.  Skin rashes.  Swollen lymph nodes.  Plaque-like film on throat or tonsils (often seen with bacterial pharyngitis). How is this diagnosed? Your health care provider will ask you questions about your illness and your symptoms. Your medical history, along with a physical exam, is often all that is needed to diagnose pharyngitis. Sometimes, a rapid strep test is done. Other lab tests may also be done, depending on the suspected cause. How is this treated? Viral pharyngitis will usually get better in 3-4 days without the use of medicine. Bacterial pharyngitis is treated with  medicines that kill germs (antibiotics). Follow these instructions at home:  Drink enough water and fluids to keep your urine clear or pale yellow.  Only take over-the-counter or prescription medicines as directed by your health care provider:  If you are prescribed antibiotics, make sure you finish them even if you start to feel better.  Do not take aspirin.  Get lots of rest.  Gargle with 8 oz of salt water ( tsp of salt per 1 qt of water) as often as every 1-2 hours to soothe your throat.  Throat lozenges (if you are not at risk for choking) or sprays may be used to soothe your throat. Contact a health care provider if:  You have large, tender lumps in your neck.  You have a rash.  You cough up green, yellow-brown, or bloody spit. Get help right away if:  Your neck becomes stiff.  You drool or are unable to swallow liquids.  You vomit or are unable to keep medicines or liquids down.  You have severe pain that does not go away with the use of recommended medicines.  You have trouble breathing (not caused by a stuffy nose). This information is not intended to replace advice given to you by your health care  provider. Make sure you discuss any questions you have with your health care provider. Document Released: 08/12/2005 Document Revised: 01/18/2016 Document Reviewed: 04/19/2013 Elsevier Interactive Patient Education  2017 [employer] Acute Bronchitis, Adult Acute bronchitis is sudden (acute) swelling of the air tubes (bronchi) in the lungs. Acute bronchitis causes these tubes to fill with mucus, which can make it hard to breathe. It can also cause coughing or wheezing. In adults, acute bronchitis usually goes away within 2 weeks. A cough caused by bronchitis may last up to 3 weeks. Smoking, allergies, and asthma can make the condition worse. Repeated episodes of bronchitis may cause further lung problems, such as chronic obstructive pulmonary disease (COPD). What are the  causes? This condition can be caused by germs and by substances that irritate the lungs, including:  Cold and flu viruses. This condition is most often caused by the same virus that causes a cold.  Bacteria.  Exposure to tobacco smoke, dust, fumes, and air pollution. What increases the risk? This condition is more likely to develop in people who:  Have close contact with someone with acute bronchitis.  Are exposed to lung irritants, such as tobacco smoke, dust, fumes, and vapors.  Have a weak immune system.  Have a respiratory condition such as asthma. What are the signs or symptoms? Symptoms of this condition include:  A cough.  Coughing up clear, yellow, or green mucus.  Wheezing.  Chest congestion.  Shortness of breath.  A fever.  Body aches.  Chills.  A sore throat. How is this diagnosed? This condition is usually diagnosed with a physical exam. During the exam, your health care provider may order tests, such as chest X-rays, to rule out other conditions. He or she may also:  Test a sample of your mucus for bacterial infection.  Check the level of oxygen in your blood. This is done to check for pneumonia.  Do a chest X-ray or lung function testing to rule out pneumonia and other conditions.  Perform blood tests. Your health care provider will also ask about your symptoms and medical history. How is this treated? Most cases of acute bronchitis clear up over time without treatment. Your health care provider may recommend:  Drinking more fluids. Drinking more makes your mucus thinner, which may make it easier to breathe.  Taking a medicine for a fever or cough.  Taking an antibiotic medicine.  Using an inhaler to help improve shortness of breath and to control a cough.  Using a cool mist vaporizer or humidifier to make it easier to breathe. Follow these instructions at home: Medicines   Take over-the-counter and prescription medicines only as told by  your health care provider.  If you were prescribed an antibiotic, take it as told by your health care provider. Do not stop taking the antibiotic even if you start to feel better. General instructions   Get plenty of rest.  Drink enough fluids to keep your urine clear or pale yellow.  Avoid smoking and secondhand smoke. Exposure to cigarette smoke or irritating chemicals will make bronchitis worse. If you smoke and you need help quitting, ask your health care provider. Quitting smoking will help your lungs heal faster.  Use an inhaler, cool mist vaporizer, or humidifier as told by your health care provider.  Keep all follow-up visits as told by your health care provider. This is important. How is this prevented? To lower your risk of getting this condition again:  Wash your hands often with soap and  water. If soap and water are not available, use hand sanitizer.  Avoid contact with people who have cold symptoms.  Try not to touch your hands to your mouth, nose, or eyes.  Make sure to get the flu shot every year. Contact a health care provider if:  Your symptoms do not improve in 2 weeks of treatment. Get help right away if:  You cough up blood.  You have chest pain.  You have severe shortness of breath.  You become dehydrated.  You faint or keep feeling like you are going to faint.  You keep vomiting.  You have a severe headache.  Your fever or chills gets worse. This information is not intended to replace advice given to you by your health care provider. Make sure you discuss any questions you have with your health care provider. Document Released: 09/19/2004 Document Revised: 03/06/2016 Document Reviewed: 01/31/2016 Elsevier Interactive Patient Education  2017 [employer].

## 2016-12-12 NOTE — Progress Notes (Signed)
Subjective:    Patient ID: Rebecca Sweeney, female    DOB: 1960-11-24, 56 y.o.   MRN: [id number]  55y/o caucasian female here for worsening throat pain/chest tightness/cough; f/u from 4/17 appt. Cough still present, now dry, nonproductive. Causing costochondritis like pain and sore throat. Denies ear pain. Reports sinus pain is much improved since starting prednisone but cough slightly worse.  Denied fever/chills/nausea/vomiting/diarrhea.  Still taking claritin/flonase, singuair, nasal saline and cough drops.  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  Smoking 10 cigarettes per day.  Increased stress at home due to sick/elderly familly members living with her.       Review of Systems  Constitutional: Positive for fatigue. Negative for activity change, appetite change, chills, diaphoresis, fever and unexpected weight change.  HENT: Positive for congestion, postnasal drip, rhinorrhea and sore throat. Negative for dental problem, drooling, ear discharge, ear pain, facial swelling, hearing loss, mouth sores, nosebleeds, sinus pain, sinus pressure, sneezing, tinnitus, trouble swallowing and voice change.   Eyes: Negative for photophobia, pain, discharge, redness, itching and visual disturbance.  Respiratory: Positive for cough and chest tightness. Negative for choking, shortness of breath, wheezing and stridor.   Cardiovascular: Negative for chest pain, palpitations and leg swelling.  Gastrointestinal: Negative for abdominal distention, abdominal pain, blood in stool, constipation, diarrhea, nausea and vomiting.  Endocrine: Negative for cold intolerance and heat intolerance.  Genitourinary: Negative for difficulty urinating, dysuria and hematuria.  Musculoskeletal: Positive for myalgias. Negative for arthralgias, back pain, gait problem, joint swelling, neck pain and neck stiffness.  Skin: Negative for color change, pallor, rash and wound.  Allergic/Immunologic: Positive for environmental allergies.  Negative for food allergies.  Neurological: Negative for dizziness, tremors, seizures, syncope, facial asymmetry, speech difficulty, weakness, light-headedness, numbness and headaches.  Hematological: Negative for adenopathy. Does not bruise/bleed easily.  Psychiatric/Behavioral: Negative for agitation, behavioral problems, confusion and sleep disturbance.          Physical Exam  Objective:   Physical Exam  Constitutional: She is oriented to person, place, and time. Vital signs are normal. She appears well-developed and well-nourished. She is active and cooperative.  Non-toxic appearance. She does not have a sickly appearance. She appears ill. No distress.  HENT:  Head: Normocephalic and atraumatic.  Right Ear: Hearing, external ear and ear canal normal. Tympanic membrane is bulging. A middle ear effusion is present.  Left Ear: Hearing, external ear and ear canal normal. Tympanic membrane is erythematous and bulging. A middle ear effusion is present.  Nose: Mucosal edema and rhinorrhea present. No nose lacerations, sinus tenderness, nasal deformity, septal deviation or nasal septal hematoma. No epistaxis.  No foreign bodies.  Right sinus exhibits no frontal or maxillary sinus tenderness. Left sinus exhibits no frontal or maxillary sinus tenderness.  Mouth/Throat: Uvula is midline and mucous membranes are normal. Mucous membranes are not pale, not dry and not cyanotic. She does not have dentures. No oral lesions. No trismus in the jaw. Normal dentition. No dental abscesses, uvula swelling, lacerations or dental caries. Tonsils !+/4 bilaterally edema/erythema and posterior oropharyngeal edema and macular oropharyngeal erythema and posterior oropharyngeal erythema present. No oropharyngeal exudate or tonsillar abscesses.  Cobblestoning posterior pharynx; bilateral TMS air fluid level clear bulging; left TM scant erythema centrally; bilateral nasal turbinates edema erythema clear discharge; bilateral  allergic shiners; bilateral maxillary sinuses NOT tender to palpation  Eyes: Conjunctivae, EOM and lids are normal. Pupils are equal, round, and reactive to light. Right eye exhibits no chemosis, no discharge, no exudate and no hordeolum.  No foreign body present in the right eye. Left eye exhibits no chemosis, no discharge, no exudate and no hordeolum. No foreign body present in the left eye. Right conjunctiva is not injected. Right conjunctiva has no hemorrhage. Left conjunctiva is not injected. Left conjunctiva has no hemorrhage. No scleral icterus. Right eye exhibits normal extraocular motion and no nystagmus. Left eye exhibits normal extraocular motion and no nystagmus. Right pupil is round and reactive. Left pupil is round and reactive. Pupils are equal.  Neck: Trachea normal and normal range of motion. Neck supple. No tracheal tenderness, no spinous process tenderness and no muscular tenderness present. No neck rigidity. No tracheal deviation, no edema, no erythema and normal range of motion present. No thyroid mass and no thyromegaly present.  Cardiovascular: Normal rate, regular rhythm, S1 normal, S2 normal, normal heart sounds and intact distal pulses.  PMI is not displaced.  Exam reveals no gallop and no friction rub.   No murmur heard. Pulmonary/Chest: Effort normal and breath sounds normal. No accessory muscle usage or stridor. No respiratory distress. She has no decreased breath sounds. She has no wheezes. She has no rhonchi. She has no rales. She exhibits no tenderness.  Speaks full sentences without difficulty; cough harsh nonproductive intermitten observed in exam room  Abdominal: Soft. Normal appearance. She exhibits no distension. There is no rigidity and no guarding. Hernia confirmed negative in the ventral area.  Musculoskeletal: Normal range of motion. She exhibits no edema, tenderness or deformity.       Right shoulder: Normal.       Left shoulder: Normal.       Right hip: Normal.        Left hip: Normal.       Right knee: Normal.       Left knee: Normal.       Cervical back: Normal.       Right hand: Normal.       Left hand: Normal.  Lymphadenopathy:       Head (right side): No submental, no submandibular, no tonsillar, no preauricular, no posterior auricular and no occipital adenopathy present.       Head (left side): No submental, no submandibular, no tonsillar, no preauricular, no posterior auricular and no occipital adenopathy present.    She has no cervical adenopathy.       Right cervical: No superficial cervical, no deep cervical and no posterior cervical adenopathy present.      Left cervical: No superficial cervical, no deep cervical and no posterior cervical adenopathy present.  Neurological: She is alert and oriented to person, place, and time. She has normal strength. She is not disoriented. She displays no atrophy and no tremor. No cranial nerve deficit or sensory deficit. She exhibits normal muscle tone. She displays no seizure activity. Coordination and gait normal. GCS eye subscore is 4. GCS verbal subscore is 5. GCS motor subscore is 6.  Skin: Skin is warm, dry and intact. No abrasion, no bruising, no burn, no ecchymosis, no laceration, no lesion, no petechiae and no rash noted. She is not diaphoretic. No cyanosis or erythema. No pallor. Nails show no clubbing.  Psychiatric: She has a normal mood and affect. Her speech is normal and behavior is normal. Judgment and thought content normal. Cognition and memory are normal.  Nursing note and vitals reviewed.         Assessment & Plan:  A-acute pharyngitis, acute bronchitis and bilateral otitis media acute  P-stop smoking discussed classes available at [facility] and through work [employer] with patient.  She refused at this time.  Discussed recurrent URIs probably related to her smoking and stress/age.  This is third episode bronchitis this year.  Consider chest xray if no improvement with plan of  care.  Start doxycycline  po BID x 10 days #20 RF0 dispensed from [facility]  Albuterol MDI 1-2 puffs po q4-6 hours prn cough protracted/wheezing/chest tightness #1 RF0.  Continue prednisone  Take 2 tabs po daily x 10 days #21 RF0 from [facility] for chronic rhinosinusitis per up to date.  Continue nasal saline aggressive in shower use and 2 sprays each nostril q2h wa #1 RF0 dispensed from clinic stock.  Phenylephine  po q6h prn rhinitis #4 dispensed from clinic stock.  Continue claritin  po daily and singulair  po daily and flonase 1 spray each nostril BID.  Will recheck on Thursday.  Has had azithromycin Mar 2018 and doxycycline Feb 2018.  Sp02 at  baseline.Bronchitis simple, community acquired, may have started as viral (probably respiratory syncytial, parainfluenza, influenza, or adenovirus), but now evidence of acute purulent bronchitis with resultant bronchial edema and mucus formation.  Viruses are the most common cause of bronchial inflammation in otherwise healthy adults with acute bronchitis.  The appearance of sputum is not predictive of whether a bacterial infection is present.  Purulent sputum is most often caused by viral infections.  There are a small portion of those caused by non-viral agents being Mycoplama pneumonia.  Microscopic examination or C&S of sputum in the healthy adult with acute bronchitis is generally not helpful (usually negative or normal respiratory flora) other considerations being cough from upper respiratory tract infections, sinusitis or allergic syndromes (mild asthma or viral pneumonia).  Differential Diagnoses:  reactive airway disease (asthma, allergic aspergillosis (eosinophilia), chronic bronchitis, respiratory infection (sinusitis, common cold, pneumonia), congestive heart failure, reflux esophagitis, bronchogenic tumor, aspiration syndromes and/or exposure to pulmonary irritants/smoke.  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  Without high fever, severe dyspnea, lack of physical findings or other risk factors, I will hold on a chest radiograph and CBC at this time.  I discussed that approximately 50% of patients with acute bronchitis have a cough that lasts up to three weeks, and 25% for over a month.  Tylenol, one to two tablets every four hours as needed for fever or myalgias.  No aspirin.  Patient instructed to follow up in five days or sooner if symptoms worsen with PCM/Urgent Care/teldoc.  Patient verbalized agreement and understanding of treatment plan.  P2:  hand washing and cover cough  Supportive treatment.   No evidence of invasive bacterial infection, non toxic and well hydrated.  This is most likely self limiting viral infection.  I do not see where any further testing or imaging is necessary at this time.   I will suggest supportive care, rest, good hygiene and encourage the patient to take adequate fluids.  The patient is to return to clinic or EMERGENCY ROOM if symptoms worsen or change significantly e.g. ear pain, fever, purulent discharge from ears or bleeding.  Exitcare handout on otitis media given to patient.  Patient verbalized agreement and understanding of treatment plan.    .  Usually no specific medical treatment is needed if a virus is causing the sore throat.  The throat most often gets better on its own within 5 to 7 days.  Antibiotic medicine does not cure viral pharyngitis.   For acute  pharyngitis caused by bacteria, your healthcare provider will prescribe an antibiotic.  Marland Kitchen Do not smoke.  Marland Kitchen Avoid secondhand smoke and other air pollutants.  . Use a cool mist humidifier to add moisture to the air.  . Get plenty of rest.  . You may want to rest your throat by talking less and eating a diet that is mostly liquid or soft for a day or two.   Marland Kitchen Nonprescription throat lozenges and mouthwashes should help relieve the soreness.   . Gargling with warm saltwater and drinking warm  liquids may help.  (You can make a saltwater solution by adding 1/4 teaspoon of salt to 8 ounces, or 240 mL, of warm water.)  . A nonprescription pain reliever such as aspirin, acetaminophen, or ibuprofen may ease general aches and pains.   FOLLOW UP with clinic provider if no improvements in the next 7-10 days.  Patient verbalized understanding of instructions and agreed with plan of care. P2:  Hand washing and diet.

## 2017-04-01 ENCOUNTER — Ambulatory Visit: Payer: Self-pay | Admitting: Registered Nurse

## 2017-04-01 VITALS — BP 94/71 | HR 73 | Temp 98.5°F

## 2017-04-10 ENCOUNTER — Ambulatory Visit: Payer: Self-pay | Admitting: Registered Nurse

## 2017-04-10 ENCOUNTER — Encounter: Payer: Self-pay | Admitting: Registered Nurse

## 2017-04-10 VITALS — BP 113/79 | HR 67 | Temp 98.1°F

## 2017-04-10 DIAGNOSIS — F17210 Nicotine dependence, cigarettes, uncomplicated: Secondary | ICD-10-CM

## 2017-04-10 DIAGNOSIS — J0101 Acute recurrent maxillary sinusitis: Secondary | ICD-10-CM

## 2017-04-10 DIAGNOSIS — H6593 Unspecified nonsuppurative otitis media, bilateral: Secondary | ICD-10-CM

## 2017-04-10 MED ORDER — IBUPROFEN 800 MG PO TABS: 800.0000 mg | ORAL_TABLET | Freq: Three times a day (TID) | ORAL | 0 refills | Status: AC | PRN

## 2017-04-10 MED ORDER — AMOXICILLIN-POT CLAVULANATE 875-125 MG PO TABS: 1.0000 | ORAL_TABLET | Freq: Two times a day (BID) | ORAL | 0 refills | Status: DC

## 2017-04-10 NOTE — Patient Instructions (Signed)
Motrin 800mg  by mouth every 8 hours as needed for pain; take with food biofreeze topical every 6 hours as needed for pain apply to affected area Thermacare/heat or ice 15 minutes 4 times per day Stretching at least twice a day  Sacroiliac Joint Dysfunction Sacroiliac joint dysfunction is a condition that causes inflammation on one or both sides of the sacroiliac (SI) joint. The SI joint connects the lower part of the spine (sacrum) with the two upper portions of the pelvis (ilium). This condition causes deep aching or burning pain in the low back. In some cases, the pain may also spread into one or both buttocks or hips or spread down the legs. What are the causes? This condition may be caused by:  Pregnancy. During pregnancy, extra stress is put on the SI joints because the pelvis widens.  Injury, such as: ? Car accidents. ? Sport-related injuries. ? Work-related injuries.  Having one leg that is shorter than the other.  Conditions that affect the joints, such as: ? Rheumatoid arthritis. ? Gout. ? Psoriatic arthritis. ? Joint infection (septic arthritis).  Sometimes, the cause of SI joint dysfunction is not known. What are the signs or symptoms? Symptoms of this condition include:  Aching or burning pain in the lower back. The pain may also spread to other areas, such as: ? Buttocks. ? Groin. ? Thighs and legs.  Muscle spasms in or around the painful areas.  Increased pain when standing, walking, running, stair climbing, bending, or lifting.  How is this diagnosed? Your health care provider will do a physical exam and take your medical history. During the exam, the health care provider may move one or both of your legs to different positions to check for pain. Various tests may be done to help verify the diagnosis, including:  Imaging tests to look for other causes of pain. These may include: ? MRI. ? CT scan. ? Bone scan.  Diagnostic injection. A numbing medicine is  injected into the SI joint using a needle. If the pain is temporarily improved or stopped after the injection, this can indicate that SI joint dysfunction is the problem.  How is this treated? Treatment may vary depending on the cause and severity of your condition. Treatment options may include:  Applying ice or heat to the lower back area. This can help to reduce pain and muscle spasms.  Medicines to relieve pain or inflammation or to relax the muscles.  Wearing a back brace (sacroiliac brace) to help support the joint while your back is healing.  Physical therapy to increase muscle strength around the joint and flexibility at the joint. This may also involve learning proper body positions and ways of moving to relieve stress on the joint.  Direct manipulation of the SI joint.  Injections of steroid medicine into the joint in order to reduce pain and swelling.  Radiofrequency ablation to burn away nerves that are carrying pain messages from the joint.  Use of a device that provides electrical stimulation in order to reduce pain at the joint.  Surgery to put in screws and plates that limit or prevent joint motion. This is rare.  Follow these instructions at home:  Rest as needed. Limit your activities as directed by your health care provider.  Take medicines only as directed by your health care provider.  If directed, apply ice to the affected area: ? Put ice in a plastic bag. ? Place a towel between your skin and the bag. ? Leave the  ice on for 20 minutes, 2-3 times per day.  Use a heating pad or a moist heat pack as directed by your health care provider.  Exercise as directed by your health care provider or physical therapist.  Keep all follow-up visits as directed by your health care provider. This is important. Contact a health care provider if:  Your pain is not controlled with medicine.  You have a fever.  You have increasingly severe pain. Get help right away  if:  You have weakness, numbness, or tingling in your legs or feet.  You lose control of your bladder or bowel. This information is not intended to replace advice given to you by your health care provider. Make sure you discuss any questions you have with your health care provider. Document Released: 11/08/2008 Document Revised: 01/18/2016 Document Reviewed: 04/19/2014 Elsevier Interactive Patient Education  [employer].

## 2017-04-10 NOTE — Progress Notes (Signed)
Subjective:    Patient ID: Rebecca Sweeney, female    DOB: 11-15-1960, 56 y.o.   MRN: [id number]  HPI  56y/o married caucasian female established patient reports lower R back pain that began yesterday, progressively worsening since then. Worse with ambulation. Pain radiates down lateral aspect R leg. Denies weakness in leg, numbness/tingling, loss of bowel/bladder control. Also requesting refill Ibuprofen 800mg  TID po #30 RF0 from clinic stock.  Denied trauma/acute injury.  Repetitive lifting in job and helping father at home with ADLs  Review of Systems Constitutional: Negative for chills and fever.  HENT: Negative for congestion, ear pain and sore throat.   Eyes: Negative for pain and discharge.  Respiratory: Negative for cough and wheezing.   Cardiovascular: Negative for chest pain and leg swelling.  Gastrointestinal: Negative for abdominal pain, blood in stool, constipation, diarrhea, nausea and vomiting.  Genitourinary: Negative for difficulty urinating, dysuria and hematuria.  Musculoskeletal: Positive for back pain. Negative for arthralgias, gait problem, joint swelling, myalgias, neck pain and neck stiffness.  Skin: Negative for color change, pallor, rash and wound.  Allergic/Immunologic: Positive for environmental allergies. Negative for food allergies.  Neurological: Negative for headaches.  Hematological: Negative for adenopathy. Does not bruise/bleed easily.  Psychiatric/Behavioral: Negative for agitation, confusion and sleep disturbance. The patient is not nervous/anxious.        Objective:   Physical Exam  Constitutional: She is oriented to person, place, and time. Vital signs are normal. She appears well-developed and well-nourished. She is active and cooperative.  Non-toxic appearance. She does not have a sickly appearance. She appears ill. No distress.  HENT:  Head: Normocephalic and atraumatic.  Right Ear: Hearing and external ear normal.  Left Ear: Hearing and  external ear normal.  Nose: Nose normal.  Mouth/Throat: Uvula is midline, oropharynx is clear and moist and mucous membranes are normal. No oropharyngeal exudate.  Eyes: Pupils are equal, round, and reactive to light. Conjunctivae, EOM and lids are normal. Right eye exhibits no discharge. Left eye exhibits no discharge. No scleral icterus.  Neck: Trachea normal, normal range of motion and phonation normal. Neck supple. No tracheal tenderness, no spinous process tenderness and no muscular tenderness present. No neck rigidity. No tracheal deviation, no edema, no erythema and normal range of motion present. No thyromegaly present.  Cardiovascular: Normal rate, regular rhythm, normal heart sounds and intact distal pulses.  Exam reveals no gallop and no friction rub.   No murmur heard. Pulses:      Radial pulses are 2+ on the right side, and 2+ on the left side.  Pulmonary/Chest: Effort normal and breath sounds normal. No accessory muscle usage or stridor. No respiratory distress. She has no decreased breath sounds. She has no wheezes. She has no rhonchi. She has no rales. She exhibits no tenderness.  Abdominal: Soft. Normal appearance. She exhibits no distension, no fluid wave and no ascites. There is no rigidity, no guarding and no CVA tenderness.  Musculoskeletal: Normal range of motion. She exhibits tenderness. She exhibits no edema or deformity.       Right shoulder: Normal.       Left shoulder: Normal.       Right elbow: Normal.      Left elbow: Normal.       Right hip: She exhibits tenderness. She exhibits normal range of motion, normal strength, no bony tenderness, no swelling, no crepitus, no deformity and no laceration.       Left hip: Normal.  Right knee: Normal.       Left knee: Normal.       Cervical back: Normal.       Thoracic back: Normal.       Lumbar back: She exhibits tenderness and pain. She exhibits normal range of motion, no bony tenderness, no swelling, no edema, no  deformity, no laceration, no spasm and normal pulse.       Back:       Right hand: Normal.       Left hand: Normal.  Right SI joint TTP; slight limp when transitioning sitting to standing and off exam table; gait sure and steady and smooth in hall; worsening pain with rotation and forward flexion.  Normal heel toe gait able to touch top of shoes  Lymphadenopathy:    She has no cervical adenopathy.  Neurological: She is alert and oriented to person, place, and time. She has normal strength and normal reflexes. She is not disoriented. She displays no atrophy, no tremor and normal reflexes. No cranial nerve deficit or sensory deficit. She exhibits normal muscle tone. She displays no seizure activity. Gait abnormal. Coordination normal. GCS eye subscore is 4. GCS verbal subscore is 5. GCS motor subscore is 6.  Reflex Scores:      Patellar reflexes are 2+ on the right side and 2+ on the left side. Bilateral hand grasp equal 5/5 and leg strength  Skin: Skin is warm, dry and intact. No abrasion, no bruising, no burn, no ecchymosis, no laceration, no lesion, no petechiae and no rash noted. She is not diaphoretic. No cyanosis or erythema. No pallor. Nails show no clubbing.  Psychiatric: She has a normal mood and affect. Her speech is normal and behavior is normal. Judgment and thought content normal. Cognition and memory are normal.  Nursing note and vitals reviewed.    Assessment & Plan:  A- acute Right SI joint pain dysfunction  P-filled motrin 800mg  po TID prn pain #30 RF0 from [facility] given thermacare patches x 2 and assisted with application 1 in clinic; UD 6 biofreeze given to patient for QID prn use.   Discussed red flags with patient e.g. Weakness, dysuria, paresthesias seek immediate re-evaluation.  Demonstrated Stretches/arom.  Patient did not want flexeril.For acute pain, rest, and intermittent application of heat (do not sleep on heating pad).  I discussed longer-term treatment plan of PRN PO  NSAIDS and I discussed a home back care exercise program with a strengthening and flexibility exercise.  Patient given Exitcare handout on SI joint dysfunction and stretches.  Proper avoidance of heavy lifting discussed.  Consider physical therapy or chiropractic care and radiology if not improving.  Call or return to clinic as needed if these symptoms worsen or fail to improve as anticipated especially leg weakness, loss of bowel/bladder control or saddle paresthesias.   Patient verbalized understanding of instructions/information and agreed with plan of care.  P2:  Injury Prevention, fitness

## 2017-04-10 NOTE — Patient Instructions (Signed)
Sinus Rinse What is a sinus rinse? A sinus rinse is a simple home treatment that is used to rinse your sinuses with a sterile mixture of salt and water (saline solution). Sinuses are air-filled spaces in your skull behind the bones of your face and forehead that open into your nasal cavity. You will use the following:  Saline solution.  Neti pot or spray bottle. This releases the saline solution into your nose and through your sinuses. Neti pots and spray bottles can be purchased at [employer], a health food store, or online.  When would I do a sinus rinse? A sinus rinse can help to clear mucus, dirt, dust, or pollen from the nasal cavity. You may do a sinus rinse when you have a cold, a virus, nasal allergy symptoms, a sinus infection, or stuffiness in the nose or sinuses. If you are considering a sinus rinse:  Ask your child's health care provider before performing a sinus rinse on your child.  Do not do a sinus rinse if you have had ear or nasal surgery, ear infection, or blocked ears.  How do I do a sinus rinse?  Wash your hands.  Disinfect your device according to the directions provided and then dry it.  Use the solution that comes with your device or one that is sold separately in stores. Follow the mixing directions on the package.  Fill your device with the amount of saline solution as directed by the device instructions.  Stand over a sink and tilt your head sideways over the sink.  Place the spout of the device in your upper nostril (the one closer to the ceiling).  Gently pour or squeeze the saline solution into the nasal cavity. The liquid should drain to the lower nostril if you are not overly congested.  Gently blow your nose. Blowing too hard may cause ear pain.  Repeat in the other nostril.  Clean and rinse your device with clean water and then air-dry it. Are there risks of a sinus rinse? Sinus rinse is generally very safe and effective. However,  there are a few risks, which include:  A burning sensation in the sinuses. This may happen if you do not make the saline solution as directed. Make sure to follow all directions when making the saline solution.  Infection from contaminated water. This is rare, but possible.  Nasal irritation.  This information is not intended to replace advice given to you by your health care provider. Make sure you discuss any questions you have with your health care provider. Document Released: 03/09/2014 Document Revised: 07/09/2016 Document Reviewed: 12/28/2013 Elsevier Interactive Patient Education  2017 [employer] Inc. Sinusitis, Adult Sinusitis is soreness and inflammation of your sinuses. Sinuses are hollow spaces in the bones around your face. Your sinuses are located:  Around your eyes.  In the middle of your forehead.  Behind your nose.  In your cheekbones.  Your sinuses and nasal passages are lined with a stringy fluid (mucus). Mucus normally drains out of your sinuses. When your nasal tissues become inflamed or swollen, the mucus can become trapped or blocked so air cannot flow through your sinuses. This allows bacteria, viruses, and funguses to grow, which leads to infection. Sinusitis can develop quickly and last for 7?10 days (acute) or for more than 12 weeks (chronic). Sinusitis often develops after a cold. What are the causes? This condition is caused by anything that creates swelling in the sinuses or stops mucus from draining, including:  Allergies.  Asthma.  Bacterial or viral infection.  Abnormally shaped bones between the nasal passages.  Nasal growths that contain mucus (nasal polyps).  Narrow sinus openings.  Pollutants, such as chemicals or irritants in the air.  A foreign object stuck in the nose.  A fungal infection. This is rare.  What increases the risk? The following factors may make you more likely to develop this condition:  Having allergies or  asthma.  Having had a recent cold or respiratory tract infection.  Having structural deformities or blockages in your nose or sinuses.  Having a weak immune system.  Doing a lot of swimming or diving.  Overusing nasal sprays.  Smoking.  What are the signs or symptoms? The main symptoms of this condition are pain and a feeling of pressure around the affected sinuses. Other symptoms include:  Upper toothache.  Earache.  Headache.  Bad breath.  Decreased sense of smell and taste.  A cough that may get worse at night.  Fatigue.  Fever.  Thick drainage from your nose. The drainage is often green and it may contain pus (purulent).  Stuffy nose or congestion.  Postnasal drip. This is when extra mucus collects in the throat or back of the nose.  Swelling and warmth over the affected sinuses.  Sore throat.  Sensitivity to light.  How is this diagnosed? This condition is diagnosed based on symptoms, a medical history, and a physical exam. To find out if your condition is acute or chronic, your health care provider may:  Look in your nose for signs of nasal polyps.  Tap over the affected sinus to check for signs of infection.  View the inside of your sinuses using an imaging device that has a light attached (endoscope).  If your health care provider suspects that you have chronic sinusitis, you may also:  Be tested for allergies.  Have a sample of mucus taken from your nose (nasal culture) and checked for bacteria.  Have a mucus sample examined to see if your sinusitis is related to an allergy.  If your sinusitis does not respond to treatment and it lasts longer than 8 weeks, you may have an MRI or CT scan to check your sinuses. These scans also help to determine how severe your infection is. In rare cases, a bone biopsy may be done to rule out more serious types of fungal sinus disease. How is this treated? Treatment for sinusitis depends on the cause and  whether your condition is chronic or acute. If a virus is causing your sinusitis, your symptoms will go away on their own within 10 days. You may be given medicines to relieve your symptoms, including:  Topical nasal decongestants. They shrink swollen nasal passages and let mucus drain from your sinuses.  Antihistamines. These drugs block inflammation that is triggered by allergies. This can help to ease swelling in your nose and sinuses.  Topical nasal corticosteroids. These are nasal sprays that ease inflammation and swelling in your nose and sinuses.  Nasal saline washes. These rinses can help to get rid of thick mucus in your nose.  If your condition is caused by bacteria, you will be given an antibiotic medicine. If your condition is caused by a fungus, you will be given an antifungal medicine. Surgery may be needed to correct underlying conditions, such as narrow nasal passages. Surgery may also be needed to remove polyps. Follow these instructions at home: Medicines  Take, use, or apply over-the-counter and prescription medicines only as told by   your health care provider. These may include nasal sprays.  If you were prescribed an antibiotic medicine, take it as told by your health care provider. Do not stop taking the antibiotic even if you start to feel better. Hydrate and Humidify  Drink enough water to keep your urine clear or pale yellow. Staying hydrated will help to thin your mucus.  Use a cool mist humidifier to keep the humidity level in your home above 50%.  Inhale steam for 10-15 minutes, 3-4 times a day or as told by your health care provider. You can do this in the bathroom while a hot shower is running.  Limit your exposure to cool or dry air. Rest  Rest as much as possible.  Sleep with your head raised (elevated).  Make sure to get enough sleep each night. General instructions  Apply a warm, moist washcloth to your face 3-4 times a day or as told by your  health care provider. This will help with discomfort.  Wash your hands often with soap and water to reduce your exposure to viruses and other germs. If soap and water are not available, use hand sanitizer.  Do not smoke. Avoid being around people who are smoking (secondhand smoke).  Keep all follow-up visits as told by your health care provider. This is important. Contact a health care provider if:  You have a fever.  Your symptoms get worse.  Your symptoms do not improve within 10 days. Get help right away if:  You have a severe headache.  You have persistent vomiting.  You have pain or swelling around your face or eyes.  You have vision problems.  You develop confusion.  Your neck is stiff.  You have trouble breathing. This information is not intended to replace advice given to you by your health care provider. Make sure you discuss any questions you have with your health care provider. Document Released: 08/12/2005 Document Revised: 04/07/2016 Document Reviewed: 06/07/2015 Elsevier Interactive Patient Education  2017 [employer] Steps to Quit Smoking Smoking tobacco can be harmful to your health and can affect almost every organ in your body. Smoking puts you, and those around you, at risk for developing many serious chronic diseases. Quitting smoking is difficult, but it is one of the best things that you can do for your health. It is never too late to quit. What are the benefits of quitting smoking? When you quit smoking, you lower your risk of developing serious diseases and conditions, such as:  Lung cancer or lung disease, such as COPD.  Heart disease.  Stroke.  Heart attack.  Infertility.  Osteoporosis and bone fractures.  Additionally, symptoms such as coughing, wheezing, and shortness of breath may get better when you quit. You may also find that you get sick less often because your body is stronger at fighting off colds and infections. If you are  pregnant, quitting smoking can help to reduce your chances of having a baby of low birth weight. How do I get ready to quit? When you decide to quit smoking, create a plan to make sure that you are successful. Before you quit:  Pick a date to quit. Set a date within the next two weeks to give you time to prepare.  Write down the reasons why you are quitting. Keep this list in places where you will see it often, such as on your bathroom mirror or in your car or wallet.  Identify the people, places, things, and activities that make you  want to smoke (triggers) and avoid them. Make sure to take these actions: ? Throw away all cigarettes at home, at work, and in your car. ? Throw away smoking accessories, such as Set designer. ? Clean your car and make sure to empty the ashtray. ? Clean your home, including curtains and carpets.  Tell your family, friends, and coworkers that you are quitting. Support from your loved ones can make quitting easier.  Talk with your health care provider about your options for quitting smoking.  Find out what treatment options are covered by your health insurance.  What strategies can I use to quit smoking? Talk with your healthcare provider about different strategies to quit smoking. Some strategies include:  Quitting smoking altogether instead of gradually lessening how much you smoke over a period of time. Research shows that quitting "cold [place]" is more successful than gradually quitting.  Attending in-person counseling to help you build problem-solving skills. You are more likely to have success in quitting if you attend several counseling sessions. Even short sessions of 10 minutes can be effective.  Finding resources and support systems that can help you to quit smoking and remain smoke-free after you quit. These resources are most helpful when you use them often. They can include: ? Online chats with a Veterinary surgeon. ? Telephone  quitlines. ? Automotive engineer. ? Support groups or group counseling. ? Text messaging programs. ? Mobile phone applications.  Taking medicines to help you quit smoking. (If you are pregnant or breastfeeding, talk with your health care provider first.) Some medicines contain nicotine and some do not. Both types of medicines help with cravings, but the medicines that include nicotine help to relieve withdrawal symptoms. Your health care provider may recommend: ? Nicotine patches, gum, or lozenges. ? Nicotine inhalers or sprays. ? Non-nicotine medicine that is taken by mouth.  Talk with your health care provider about combining strategies, such as taking medicines while you are also receiving in-person counseling. Using these two strategies together makes you more likely to succeed in quitting than if you used either strategy on its own. If you are pregnant or breastfeeding, talk with your health care provider about finding counseling or other support strategies to quit smoking. Do not take medicine to help you quit smoking unless told to do so by your health care provider. What things can I do to make it easier to quit? Quitting smoking might feel overwhelming at first, but there is a lot that you can do to make it easier. Take these important actions:  Reach out to your family and friends and ask that they support and encourage you during this time. Call telephone quitlines, reach out to support groups, or work with a counselor for support.  Ask people who smoke to avoid smoking around you.  Avoid places that trigger you to smoke, such as bars, parties, or smoke-break areas at work.  Spend time around people who do not smoke.  Lessen stress in your life, because stress can be a smoking trigger for some people. To lessen stress, try: ? Exercising regularly. ? Deep-breathing exercises. ? Yoga. ? Meditating. ? Performing a body scan. This involves closing your eyes, scanning your  body from head to toe, and noticing which parts of your body are particularly tense. Purposefully relax the muscles in those areas.  Download or purchase mobile phone or tablet apps (applications) that can help you stick to your quit plan by providing reminders, tips, and encouragement. There are many  free apps, such as QuitGuide from the [employer] developer for Disease Control and Prevention). You can find other support for quitting smoking (smoking cessation) through smokefree.gov and other websites.  How will I feel when I quit smoking? Within the first 24 hours of quitting smoking, you may start to feel some withdrawal symptoms. These symptoms are usually most noticeable 2-3 days after quitting, but they usually do not last beyond 2-3 weeks. Changes or symptoms that you might experience include:  Mood swings.  Restlessness, anxiety, or irritation.  Difficulty concentrating.  Dizziness.  Strong cravings for sugary foods in addition to nicotine.  Mild weight gain.  Constipation.  Nausea.  Coughing or a sore throat.  Changes in how your medicines work in your body.  A depressed mood.  Difficulty sleeping (insomnia).  After the first 2-3 weeks of quitting, you may start to notice more positive results, such as:  Improved sense of smell and taste.  Decreased coughing and sore throat.  Slower heart rate.  Lower blood pressure.  Clearer skin.  The ability to breathe more easily.  Fewer sick days.  Quitting smoking is very challenging for most people. Do not get discouraged if you are not successful the first time. Some people need to make many attempts to quit before they achieve long-term success. Do your best to stick to your quit plan, and talk with your health care provider if you have any questions or concerns. This information is not intended to replace advice given to you by your health care provider. Make sure you discuss any questions you have with your health care  provider. Document Released: 08/06/2001 Document Revised: 04/09/2016 Document Reviewed: 12/27/2014 Elsevier Interactive Patient Education  2017 [employer].

## 2017-04-10 NOTE — Progress Notes (Signed)
Subjective:    Patient ID: Rebecca Sweeney, female    DOB: 1961/07/08, 56 y.o.   MRN: [id number]  56y/o married caucasian female established patient reports productive cough yellow, sore throat since Sunday.  Feels pressure under upper denture plate. Using Ibuprofen, saline, flonase, claritin and NyQuil at home.  Not improving coughing at work denied sick contacts.  Still smoking 7-8 cigarettes per day.  Back pain has resolved with plan of care.  Denied fall allergies.        Review of Systems  Constitutional: Negative for activity change, appetite change, chills, diaphoresis, fatigue, fever and unexpected weight change.  HENT: Positive for congestion, ear pain, postnasal drip, rhinorrhea, sinus pain, sinus pressure and sore throat. Negative for dental problem, drooling, ear discharge, facial swelling, hearing loss, mouth sores, nosebleeds, sneezing, tinnitus, trouble swallowing and voice change.   Eyes: Negative for photophobia, pain, discharge, redness, itching and visual disturbance.  Respiratory: Positive for cough. Negative for choking, chest tightness, shortness of breath, wheezing and stridor.   Cardiovascular: Negative for chest pain, palpitations and leg swelling.  Gastrointestinal: Negative for abdominal distention, abdominal pain, blood in stool, constipation, diarrhea, nausea and vomiting.  Endocrine: Negative for cold intolerance and heat intolerance.  Genitourinary: Negative for difficulty urinating, dysuria and hematuria.  Musculoskeletal: Negative for arthralgias, back pain, gait problem, joint swelling, myalgias, neck pain and neck stiffness.  Skin: Negative for color change, pallor, rash and wound.  Allergic/Immunologic: Positive for environmental allergies. Negative for food allergies.  Neurological: Positive for headaches. Negative for dizziness, tremors, seizures, syncope, facial asymmetry, speech difficulty, weakness, light-headedness and numbness.  Hematological: Negative  for adenopathy. Does not bruise/bleed easily.  Psychiatric/Behavioral: Negative for agitation, behavioral problems, confusion and sleep disturbance.       Objective:   Physical Exam  Constitutional: She is oriented to person, place, and time. Vital signs are normal. She appears well-developed and well-nourished. She is active and cooperative.  Non-toxic appearance. She does not have a sickly appearance. She appears ill. No distress.  HENT:  Head: Normocephalic and atraumatic.  Right Ear: Hearing, external ear and ear canal normal. A middle ear effusion is present.  Left Ear: Hearing, external ear and ear canal normal. Tympanic membrane is erythematous. A middle ear effusion is present.  Nose: Mucosal edema and rhinorrhea present. No nose lacerations, sinus tenderness, nasal deformity, septal deviation or nasal septal hematoma. No epistaxis.  No foreign bodies. Right sinus exhibits maxillary sinus tenderness. Right sinus exhibits no frontal sinus tenderness. Left sinus exhibits maxillary sinus tenderness. Left sinus exhibits no frontal sinus tenderness.  Mouth/Throat: Uvula is midline and mucous membranes are normal. Mucous membranes are not pale, not dry and not cyanotic. She has dentures. No oral lesions. No trismus in the jaw. Normal dentition. No dental abscesses, uvula swelling, lacerations or dental caries. Posterior oropharyngeal edema and posterior oropharyngeal erythema present. No oropharyngeal exudate or tonsillar abscesses.  Left TM erythema 25% edges with air fluid level; right tm central opacity 25% air fluid level; cobblestoning posterior pharynx; bilateral nasal turbinates edema/erythema clear discharge; bilateral allergic shiners  Eyes: Pupils are equal, round, and reactive to light. Conjunctivae, EOM and lids are normal. Right eye exhibits no chemosis, no discharge, no exudate and no hordeolum. No foreign body present in the right eye. Left eye exhibits no chemosis, no discharge, no  exudate and no hordeolum. No foreign body present in the left eye. Right conjunctiva is not injected. Right conjunctiva has no hemorrhage. Left conjunctiva is not injected. Left  conjunctiva has no hemorrhage. No scleral icterus. Right eye exhibits normal extraocular motion and no nystagmus. Left eye exhibits normal extraocular motion and no nystagmus. Right pupil is round and reactive. Left pupil is round and reactive. Pupils are equal.  Neck: Trachea normal, normal range of motion and phonation normal. Neck supple. No tracheal tenderness, no spinous process tenderness and no muscular tenderness present. No neck rigidity. No tracheal deviation, no edema, no erythema and normal range of motion present. No thyroid mass and no thyromegaly present.  Cardiovascular: Normal rate, regular rhythm, S1 normal, S2 normal, normal heart sounds and intact distal pulses.  PMI is not displaced.  Exam reveals no gallop and no friction rub.   No murmur heard. Pulmonary/Chest: Effort normal and breath sounds normal. No accessory muscle usage or stridor. No respiratory distress. She has no decreased breath sounds. She has no wheezes. She has no rhonchi. She has no rales. She exhibits no tenderness.  Cough not observed in exam room; speaks full sentences without difficulty  Abdominal: Soft. Normal appearance. She exhibits no distension, no fluid wave and no ascites. There is no rigidity and no guarding.  Musculoskeletal: Normal range of motion. She exhibits no edema or tenderness.       Right shoulder: Normal.       Left shoulder: Normal.       Right hip: Normal.       Left hip: Normal.       Right knee: Normal.       Left knee: Normal.       Cervical back: Normal.       Right hand: Normal.       Left hand: Normal.  Lymphadenopathy:       Head (right side): No submental, no submandibular, no tonsillar, no preauricular, no posterior auricular and no occipital adenopathy present.       Head (left side): No submental, no  submandibular, no tonsillar, no preauricular, no posterior auricular and no occipital adenopathy present.    She has no cervical adenopathy.       Right cervical: No superficial cervical, no deep cervical and no posterior cervical adenopathy present.      Left cervical: No superficial cervical, no deep cervical and no posterior cervical adenopathy present.  Neurological: She is alert and oriented to person, place, and time. She has normal strength. She is not disoriented. She displays no atrophy and no tremor. No cranial nerve deficit or sensory deficit. She exhibits normal muscle tone. She displays no seizure activity. Coordination and gait normal. GCS eye subscore is 4. GCS verbal subscore is 5. GCS motor subscore is 6.  On/off exam table and in/out of chair without difficulty; no limp gait sure and steady in hall; bilateral hand grasp equal bilaterally 5/5  Skin: Skin is warm, dry and intact. No abrasion, no bruising, no burn, no ecchymosis, no laceration, no lesion, no petechiae and no rash noted. She is not diaphoretic. No cyanosis or erythema. No pallor. Nails show no clubbing.  Psychiatric: She has a normal mood and affect. Her speech is normal and behavior is normal. Judgment and thought content normal. Cognition and memory are normal.  Nursing note and vitals reviewed.         Assessment & Plan:  A-recurrent maxillary sinusitis acute; bilateral otitis media effusion; seasonal allergic rhinitis  P-augmentin 875mg  po BID x 10 days #20 RF0 dispensed from [facility]; given 8 UD honey cough lozenges from clinic stock po q2h prn cough; stop smoking  or at least cut down number cigarettes per day while sick; continue claritin 10mg  po daily; restart singulair 10mg  po qhs, continue nasal saline 2 sprays each nostril q2h wa and flonase 1 spray each nostril BID at home.; shower prior to bedtime; motrin 800mg  po TID prn pain.  Patient may use normal saline nasal spray as needed.  Consider antihistamine or  nasal steroid use.  Avoid triggers if possible.  Shower prior to bedtime if exposed to triggers.  If allergic dust/dust mites recommend mattress/pillow covers/encasements; washing linens, vacuuming, sweeping, dusting weekly.  Call or return to clinic as needed if these symptoms worsen or fail to improve as anticipated.   Exitcare handout on allergic rhinitis given to patient.  Patient verbalized understanding of instructions, agreed with plan of care and had no further questions at this time.  P2:  Avoidance and hand washing.  Supportive treatment.   No evidence of invasive bacterial infection, non toxic and well hydrated.  This is most likely self limiting viral infection.  I do not see where any further testing or imaging is necessary at this time.   I will suggest supportive care, rest, good hygiene and encourage the patient to take adequate fluids.  The patient is to return to clinic or EMERGENCY ROOM if symptoms worsen or change significantly e.g. ear pain, fever, purulent discharge from ears or bleeding.  Exitcare handout on otitis media with effusion given to patient.  Patient verbalized agreement and understanding of treatment plan.    No evidence of systemic bacterial infection, non toxic and well hydrated.  I do not see where any further testing or imaging is necessary at this time.   I will suggest supportive care, rest, good hygiene and encourage the patient to take adequate fluids.  The patient is to return to clinic or EMERGENCY ROOM if symptoms worsen or change significantly.  Exitcare handout on sinusitis given to patient.  Patient verbalized agreement and understanding of treatment plan and had no further questions at this time.   P2:  Hand washing and cover cough

## 2017-04-21 LAB — LIPID PANEL
Cholesterol: 247 — AB (ref 0–200)
HDL: 40 (ref 35–70)
LDL CALC: 181
Triglycerides: 128 (ref 40–160)

## 2017-04-21 LAB — TSH: TSH: 1.54 (ref <=5.90)

## 2017-04-21 LAB — VITAMIN D 25 HYDROXY (VIT D DEFICIENCY, FRACTURES): Vit D, 25-Hydroxy: 21

## 2017-04-21 LAB — HEMOGLOBIN A1C: Hemoglobin A1C: 5.2

## 2017-04-25 ENCOUNTER — Ambulatory Visit (INDEPENDENT_AMBULATORY_CARE_PROVIDER_SITE_OTHER): Payer: PRIVATE HEALTH INSURANCE | Admitting: Physician Assistant

## 2017-04-25 ENCOUNTER — Encounter: Payer: Self-pay | Admitting: Physician Assistant

## 2017-04-25 VITALS — BP 117/79 | HR 80 | Temp 98.0°F | Resp 18 | Ht 66.0 in | Wt 141.8 lb

## 2017-04-25 DIAGNOSIS — Z23 Encounter for immunization: Secondary | ICD-10-CM | POA: Diagnosis not present

## 2017-04-25 DIAGNOSIS — Z1159 Encounter for screening for other viral diseases: Secondary | ICD-10-CM

## 2017-04-25 DIAGNOSIS — Z7689 Persons encountering health services in other specified circumstances: Secondary | ICD-10-CM

## 2017-04-25 DIAGNOSIS — Z1211 Encounter for screening for malignant neoplasm of colon: Secondary | ICD-10-CM

## 2017-04-25 DIAGNOSIS — Z114 Encounter for screening for human immunodeficiency virus [HIV]: Secondary | ICD-10-CM | POA: Diagnosis not present

## 2017-04-25 NOTE — Progress Notes (Signed)
Patient ID: Rebecca ChamberSonia Sciara, female     DOB: 15-Apr-1961, 56 y.o.    MRN: [id number]  PCP: No primary care provider on file.  Chief Complaint  Patient presents with  . Establish Care    Pt states she would like to have her thyroid checked and she has a dry cough.    Subjective:   This patient is new to me and presents to establish care and for evaluation of a dry cough, which she thinks may be due to a thyroid problem. Her husband, Miodrag, recommended me to her.  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  Chronic sinus pressure and congestion.  Knows that her smoking contributes, and is trying to cut back, not ready to quit. Seen at work for sinusitis, and has recently completed a course of Augmentin. Still has a cough.  Due to intermittent dry cough and sensation of tightness in the throat, she thinks she may have a thyroid problem. Not sleeping well, but is stressed over her son's immigration struggle and possible deportation and her husband's father's health is poor (he lives with his other son in [place]).  Some mouth dryness. Some vasomotor symptoms associated with perimenopause.  She recently saw her GYN, who drew blood. She was called to tell her to see her PCP to address elevated cholesterol.  She declines flu vaccine.   Review of Systems  Constitutional: Positive for diaphoresis (vasomotor). Negative for activity change, appetite change, chills, fatigue, fever and unexpected weight change.  HENT: Positive for congestion and sinus pressure. Negative for sore throat.   Eyes: Negative for visual disturbance.  Respiratory: Negative for cough, chest tightness, shortness of breath and wheezing.   Cardiovascular: Negative for chest pain and palpitations.  Gastrointestinal: Negative for abdominal pain, diarrhea, nausea and vomiting.  Endocrine: Positive for polydipsia (Dry  mouth).  Genitourinary: Negative for dysuria, frequency, hematuria and urgency.  Musculoskeletal: Negative for arthralgias and myalgias.  Skin: Negative for rash.  Allergic/Immunologic: Positive for environmental allergies. Negative for food allergies and immunocompromised state.  Neurological: Positive for headaches (this week, RIGHT sided, ?Sinuses, improved with treatment). Negative for dizziness, tremors, seizures, syncope, facial asymmetry, speech difficulty, weakness, light-headedness and numbness.  Hematological: Negative.   Psychiatric/Behavioral: Positive for sleep disturbance. Negative for decreased concentration. The patient is not nervous/anxious.      Prior to Admission medications   Medication Sig Start Date End Date Taking? Authorizing Provider  albuterol (PROVENTIL HFA;VENTOLIN HFA) 108 (90 Base) MCG/ACT inhaler Inhale 1-2 puffs into the lungs every 4 (four) hours as needed for wheezing or shortness of breath. 12/12/16  Yes Betancourt, Jarold Songina A, NP  buPROPion (WELLBUTRIN XL) 150 MG 24 hr tablet Take 150 mg by mouth daily.   Yes [provider]  drospirenone-estradiol (ANGELIQ) 0.5-1 MG tablet Take 1 tablet by mouth daily.   Yes [provider]  fluticasone (FLONASE) 50 MCG/ACT nasal spray Place 2 sprays into both nostrils daily.   Yes [provider]  loratadine (CLARITIN) 10 MG tablet Take 1 tablet (10 mg total) by mouth daily. 11/21/16  Yes Betancourt, Jarold Songina A, NP  montelukast (SINGULAIR) 10 MG tablet Take 10 mg by mouth at bedtime.    [provider]     No Known Allergies   Patient Active Problem List   Diagnosis Date Noted  . 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 Otitis media with effusion,  bilateral 12/10/2016  . Acute seasonal allergic rhinitis 11/21/2016     History reviewed. No pertinent family history.   Social History   Social History  . Marital status: Married    Spouse name: MIODRAG  . Number of children: 3  . 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 Smoking status: Current Every Day Smoker    Packs/day: 1.00    Years: 40.00    Types: Cigarettes    Start date: 12/13/1976  . Smokeless tobacco: Never Used     Comment: cutting back, uses it as stress relief, contemplating  . Alcohol use No  . Drug use: No  . Sexual activity: Not on file   Other Topics Concern  . Not on file   Social History Narrative   Originally from [place]. Came to the [place] in 04/29/1989.   Lives with her husband.   3 adult children. All live in [place].         Objective:  Physical Exam  Constitutional: She is oriented to person, place, and time. She appears well-developed and well-nourished. She is active and cooperative. No distress.  BP 117/79 (BP Location: Right Arm, Patient Position: Sitting, Cuff Size: Normal)   Pulse 80   Temp 98 F (36.7 C) (Oral)   Resp 18   Ht 5\' 6"  (1.676 m)   Wt 141 lb 12.8 oz (64.3 kg)   LMP 03/18/2012   SpO2 98%   BMI 22.89 kg/m   HENT:  Head: Normocephalic and atraumatic.  Right Ear: Hearing normal.  Left Ear: Hearing normal.  Eyes: Conjunctivae are normal. No scleral icterus.  Neck: Normal range of motion. Neck supple. No thyromegaly present.  Cardiovascular: Normal rate, regular rhythm and normal heart sounds.   Pulses:      Radial pulses are 2+ on the right side, and 2+ on the left side.  Pulmonary/Chest: Effort normal and breath sounds normal.  Lymphadenopathy:       Head (right side): No tonsillar, no preauricular, no posterior auricular and no occipital adenopathy present.       Head (left side): No tonsillar, no preauricular, no posterior auricular and no occipital adenopathy present.    She has no cervical adenopathy.       Right: No supraclavicular adenopathy present.       Left: No supraclavicular adenopathy present.  Neurological: She is alert and oriented to person, place, and time. No  sensory deficit.  Skin: Skin is warm, dry and intact. No rash noted. No cyanosis or erythema. Nails show no clubbing.  Psychiatric: She has a normal mood and affect. Her speech is normal and behavior is normal.        Assessment & Plan:  1. Encounter to establish care No signs of thyroid disorder. She will try to obtain the results of her recent labs for my review. Encouraged smoking cessation, but she is not ready.  2. Need for influenza vaccination Declines.  3. Screening for HIV (human immunodeficiency virus) - HIV antibody  4. Screening for colon cancer - Ambulatory referral to Gastroenterology  5. Need for Tdap vaccination - Tdap vaccine greater than or equal to 7yo IM  6. Need for hepatitis C screening test - Hepatitis C antibody     Return in about 3 months (around 07/25/2017) for re-evaluation of cholesterol and fasting labs.   Fernande Bras, PA-C [facility]

## 2017-04-25 NOTE — Patient Instructions (Addendum)
     IF you received an x-ray today, you will receive an invoice from [facility]. Please contact [facility] at [phone] with questions or concerns regarding your invoice.   IF you received labwork today, you will receive an invoice from [facility]. Please contact [facility] at [phone] with questions or concerns regarding your invoice.   Our billing staff will not be able to assist you with questions regarding bills from these companies.  You will be contacted with the lab results as soon as they are available. The fastest way to get your results is to activate your My Chart account. Instructions are located on the last page of this paperwork. If you have not heard from us regarding the results in 2 weeks, please contact this office.     

## 2017-04-26 LAB — HIV ANTIBODY (ROUTINE TESTING W REFLEX): HIV SCREEN 4TH GENERATION: NONREACTIVE

## 2017-04-26 LAB — HEPATITIS C ANTIBODY

## 2017-04-30 ENCOUNTER — Encounter: Payer: Self-pay | Admitting: Physician Assistant

## 2017-05-29 ENCOUNTER — Ambulatory Visit: Payer: Self-pay | Admitting: Registered Nurse

## 2017-05-29 VITALS — BP 90/68 | HR 86 | Temp 99.3°F

## 2017-05-29 DIAGNOSIS — L03116 Cellulitis of left lower limb: Secondary | ICD-10-CM

## 2017-05-29 MED ORDER — CEPHALEXIN 500 MG PO CAPS: 500.0000 mg | ORAL_CAPSULE | Freq: Two times a day (BID) | ORAL | 0 refills | Status: AC

## 2017-05-29 MED ORDER — ACETAMINOPHEN 500 MG PO TABS: 1000.0000 mg | ORAL_TABLET | Freq: Four times a day (QID) | ORAL | 0 refills | Status: AC | PRN

## 2017-05-29 NOTE — Patient Instructions (Signed)

## 2017-05-29 NOTE — Progress Notes (Signed)
Subjective:    Patient ID: Rebecca Sweeney, female    DOB: 1961/08/02, 56 y.o.   MRN: [id number]  56y/o married caucasian female established Pt reports sudden onset pain and swelling to dorsal aspect L foot at work yesterday afternoon. Tried epsom salt soak, ibuprofen, biofreeze last night. Felt like glass in foot last night wth weight bearing. Pain improved today but still with erythema and swelling. No known trauma, injury, insect bite, scratch.      Review of Systems  Constitutional: Negative for activity change, appetite change, chills, diaphoresis, fatigue and fever.  HENT: Negative for trouble swallowing and voice change.   Eyes: Negative for photophobia and visual disturbance.  Respiratory: Negative for cough and wheezing.   Cardiovascular: Negative for chest pain and leg swelling.  Gastrointestinal: Negative for blood in stool, diarrhea, nausea and vomiting.  Endocrine: Negative for cold intolerance and heat intolerance.  Genitourinary: Negative for difficulty urinating, dysuria and hematuria.  Musculoskeletal: Positive for gait problem and myalgias. Negative for arthralgias, back pain, joint swelling, neck pain and neck stiffness.  Skin: Positive for color change and rash. Negative for pallor and wound.  Allergic/Immunologic: Positive for environmental allergies. Negative for food allergies.  Neurological: Negative for tremors, syncope, weakness and headaches.  Hematological: Negative for adenopathy. Does not bruise/bleed easily.  Psychiatric/Behavioral: Negative for agitation, confusion and sleep disturbance.       Objective:   Physical Exam  Constitutional: She is oriented to person, place, and time. Vital signs are normal. She appears well-developed and well-nourished. She is active and cooperative.  Non-toxic appearance. She does not have a sickly appearance. She does not appear ill. No distress.  HENT:  Head: Normocephalic and atraumatic.  Right Ear: Hearing and external  ear normal.  Left Ear: Hearing and external ear normal.  Nose: Nose normal. No rhinorrhea, nose lacerations or nasal deformity. No epistaxis.  Mouth/Throat: Uvula is midline and mucous membranes are normal.  Eyes: Pupils are equal, round, and reactive to light. Conjunctivae, EOM and lids are normal. Right eye exhibits no chemosis, no discharge, no exudate and no hordeolum. No foreign body present in the right eye. Left eye exhibits no chemosis, no discharge, no exudate and no hordeolum. No foreign body present in the left eye. Right conjunctiva is not injected. Right conjunctiva has no hemorrhage. Left conjunctiva is not injected. Left conjunctiva has no hemorrhage. No scleral icterus. Right eye exhibits normal extraocular motion and no nystagmus. Left eye exhibits normal extraocular motion and no nystagmus. Right pupil is round and reactive. Left pupil is round and reactive. Pupils are equal.  Neck: Trachea normal, normal range of motion and phonation normal. Neck supple. No tracheal tenderness present. No neck rigidity. No tracheal deviation, no edema, no erythema and normal range of motion present. No thyroid mass and no thyromegaly present.  Cardiovascular: Normal rate, regular rhythm, S1 normal, S2 normal, normal heart sounds and intact distal pulses.  PMI is not displaced.  Exam reveals no gallop and no friction rub.   No murmur heard. Pulses:      Dorsalis pedis pulses are 2+ on the right side, and 2+ on the left side.       Posterior tibial pulses are 2+ on the right side, and 2+ on the left side.  Pulmonary/Chest: Effort normal and breath sounds normal. No accessory muscle usage or stridor. No respiratory distress. She has no decreased breath sounds. She has no wheezes. She has no rhonchi. She has no rales. She exhibits no tenderness.  Abdominal: Normal appearance. She exhibits no distension, no fluid wave and no ascites. There is no rigidity and no guarding.  Musculoskeletal: Normal range of  motion. She exhibits edema and tenderness. She exhibits no deformity.       Right shoulder: Normal.       Left shoulder: Normal.       Right elbow: Normal.      Left elbow: Normal.       Right hip: Normal.       Left hip: Normal.       Right knee: Normal.       Left knee: Normal.       Right ankle: Normal.       Left ankle: Normal.       Cervical back: Normal.       Right hand: Normal.       Left hand: Normal.       Right lower leg: Normal.       Left lower leg: Normal.       Right foot: Normal.       Left foot: There is tenderness and swelling. There is normal range of motion, no bony tenderness, normal capillary refill, no crepitus, no deformity and no laceration.       Feet:  Lymphadenopathy:       Head (right side): No submental, no submandibular, no tonsillar, no preauricular, no posterior auricular and no occipital adenopathy present.       Head (left side): No submental, no submandibular, no tonsillar, no preauricular, no posterior auricular and no occipital adenopathy present.    She has no cervical adenopathy.       Right cervical: No superficial cervical, no deep cervical and no posterior cervical adenopathy present.      Left cervical: No superficial cervical, no deep cervical and no posterior cervical adenopathy present.  Neurological: She is alert and oriented to person, place, and time. She has normal strength. She is not disoriented. She displays no atrophy and no tremor. No cranial nerve deficit or sensory deficit. She exhibits normal muscle tone. She displays no seizure activity. Coordination and gait normal. GCS eye subscore is 4. GCS verbal subscore is 5. GCS motor subscore is 6.  On/off exam table; in/out of chair without difficulty; gait slow and steady in hall wearing closed toe shoes  Skin: Skin is warm, dry and intact. Rash noted. No abrasion, no bruising, no burn, no ecchymosis, no laceration, no lesion, no petechiae and no purpura noted. Rash is macular. Rash is  not papular, not maculopapular, not nodular, not pustular, not vesicular and not urticarial. She is not diaphoretic. There is erythema. No cyanosis. No pallor. Nails show no clubbing.     Psychiatric: She has a normal mood and affect. Her speech is normal and behavior is normal. Judgment and thought content normal. Cognition and memory are normal.  Nursing note and vitals reviewed.         Assessment & Plan:  A-cellulitiis left foot  P-Cephalexin  po BID x 7 days #30 RF0 dispensed from [facility]. Discussed if improved but not resolved after 1 week keflex may need to extend to 2 weeks or change medication. Ice pack reusable given to patient from clinic stock and tylenol  po QID prn pain 4 UD given to patient from clinic stock.  Exitcare handout on skin infection given to patient. RTC if worsening erythema, pain, purulent discharge, fever. Wash towels, washcloths, sheets in hot water with bleach every couple of  days until infection resolved. ER if redness extends beyond her ankle for same day re-evalution.  Patient verbalized understanding, agreed with plan of care and had no further questions at this time.

## 2017-06-09 ENCOUNTER — Encounter: Payer: Self-pay | Admitting: Physician Assistant

## 2017-06-09 DIAGNOSIS — Z79890 Hormone replacement therapy (postmenopausal): Secondary | ICD-10-CM | POA: Insufficient documentation

## 2017-06-24 ENCOUNTER — Ambulatory Visit: Payer: Self-pay | Admitting: Registered Nurse

## 2017-06-24 VITALS — BP 95/68 | HR 75 | Temp 98.1°F

## 2017-06-24 DIAGNOSIS — L249 Irritant contact dermatitis, unspecified cause: Secondary | ICD-10-CM

## 2017-06-24 MED ORDER — PREDNISONE 10 MG (21) PO TBPK: ORAL_TABLET | ORAL | 0 refills | Status: DC

## 2017-06-24 MED ORDER — DIPHENHYDRAMINE HCL 50 MG PO TABS: 50.0000 mg | ORAL_TABLET | Freq: Every evening | ORAL | 0 refills | Status: DC | PRN

## 2017-06-24 MED ORDER — CETIRIZINE HCL 10 MG PO TABS: 10.0000 mg | ORAL_TABLET | Freq: Every day | ORAL | 0 refills | Status: DC

## 2017-06-24 MED ORDER — HYDROCORTISONE 1 % EX LOTN: 1.0000 "application " | TOPICAL_LOTION | Freq: Two times a day (BID) | CUTANEOUS | 0 refills | Status: AC

## 2017-06-24 MED ORDER — RANITIDINE HCL 150 MG PO TABS: 150.0000 mg | ORAL_TABLET | Freq: Two times a day (BID) | ORAL | 0 refills | Status: DC

## 2017-06-24 NOTE — Patient Instructions (Signed)
Angioedema Angioedema is the sudden swelling of tissue in the body. Angioedema can affect any part of the body, but it most often affects the deeper parts of the skin, causing red, itchy patches (hives) to appear over the affected area. It often begins during the night and is found in the morning. Depending on the cause, angioedema may happen:  Only once.  Several times. It may come back in unpredictable patterns.  Repeatedly for several years. Over time, it may gradually stop coming back.  Angioedema can be life-threatening if it affects the air passages that you breathe through. What are the causes? This condition may be caused by:  Foods, such as milk, eggs, shellfish, wheat, or nuts.  Certain medicines, such as ACE inhibitors, antibiotics, nonsteroidal anti-inflammatory drugs, birth control pills, or dyes used in X-rays.  Insect stings.  Infections.  Angioedema can be inherited, and episodes can be triggered by:  Mild injury.  Dental work.  Surgery.  Stress.  Sudden changes in temperature.  Exercise.  In some cases, the cause of this condition is not known. What are the signs or symptoms? Symptoms of this condition depend on where the swelling happens. Symptoms may include:  Swollen skin.  Red, itchy patches of skin (hives).  Redness in the affected area.  Pain in the affected area.  Swollen lips or tongue.  Wheezing.  Breathing problems.  If your internal organs are involved, symptoms may also include:  Nausea.  Abdominal pain.  Vomiting.  Difficulty swallowing.  Difficulty passing urine.  How is this diagnosed? This condition may be diagnosed based on:  An exam of the affected area.  Your medical history.  Whether anyone in your family has had this condition before.  A review of any medicines you have been taking.  Tests, including: ? Allergy skin tests to see if the condition was caused by an allergic reaction. ? Blood tests to see  if the condition was caused by a gene. ? Tests to check for underlying diseases that could cause the condition.  How is this treated? Treatment for this condition depends on the cause. It may involve any of the following:  If something triggered the condition, making changes to keep it from triggering the condition again.  If the condition affects your breathing, having tubes placed in your airway to keep it open.  Taking medicines to treat symptoms or prevent future episodes. These may include: ? Antihistamines. ? Epinephrine injections. ? Steroids.  If your condition is severe, you may need to be treated at the hospital. Angioedema usually gets better in 24-48 hours. Follow these instructions at home:  Take over-the-counter and prescription medicines only as told by your health care provider.  If you were given medicines for emergency allergy treatment, always carry them with you.  Wear a medical bracelet as told by your health care provider.  If something triggers your condition, avoid the trigger, if possible.  If your condition is inherited and you are thinking about having children, talk to your health care provider. It is important to discuss the risks of passing on the condition to your children. Contact a health care provider if:  You have repeated episodes of angioedema.  Episodes of angioedema start to happen more often than they used to, even after you take steps to prevent them.  You have episodes of angioedema that are more severe than they have been before, even after you take steps to prevent them.  You are thinking about having children. Get   help right away if:  You have severe swelling of your mouth, tongue, or lips.  You have trouble breathing.  You have trouble swallowing.  You faint. This information is not intended to replace advice given to you by your health care provider. Make sure you discuss any questions you have with your health care  provider. Document Released: 10/21/2001 Document Revised: 03/09/2016 Document Reviewed: 02/20/2016 Else[employer] Interactive Patient Education  2018 [employer] Contact Dermatitis Dermatitis is redness, soreness, and swelling (inflammation) of the skin. Contact dermatitis is a reaction to certain substances that touch the skin. There are two types of contact dermatitis:  Irritant contact dermatitis. This type is caused by something that irritates your skin, such as dry hands from washing them too much. This type does not require previous exposure to the substance for a reaction to occur. This type is more common.  Allergic contact dermatitis. This type is caused by a substance that you are allergic to, such as a nickel allergy or poison ivy. This type only occurs if you have been exposed to the substance (allergen) before. Upon a repeat exposure, your body reacts to the substance. This type is less common.  What are the causes? Many different substances can cause contact dermatitis. Irritant contact dermatitis is most commonly caused by exposure to:  Makeup.  Soaps.  Detergents.  Bleaches.  Acids.  Metal salts, such as nickel.  Allergic contact dermatitis is most commonly caused by exposure to:  Poisonous plants.  Chemicals.  Jewelry.  Latex.  Medicines.  Preservatives in products, such as clothing.  What increases the risk? This condition is more likely to develop in:  People who have jobs that expose them to irritants or allergens.  People who have certain medical conditions, such as asthma or eczema.  What are the signs or symptoms? Symptoms of this condition may occur anywhere on your body where the irritant has touched you or is touched by you. Symptoms include:  Dryness or flaking.  Redness.  Cracks.  Itching.  Pain or a burning feeling.  Blisters.  Drainage of small amounts of blood or clear fluid from skin cracks.  With allergic contact dermatitis,  there may also be swelling in areas such as the eyelids, mouth, or genitals. How is this diagnosed? This condition is diagnosed with a medical history and physical exam. A patch skin test may be performed to help determine the cause. If the condition is related to your job, you may need to see an occupational medicine specialist. How is this treated? Treatment for this condition includes figuring out what caused the reaction and protecting your skin from further contact. Treatment may also include:  Steroid creams or ointments. Oral steroid medicines may be needed in more severe cases.  Antibiotics or antibacterial ointments, if a skin infection is present.  Antihistamine lotion or an antihistamine taken by mouth to ease itching.  A bandage (dressing).  Follow these instructions at home: Skin Care  Moisturize your skin as needed.  Apply cool compresses to the affected areas.  Try taking a bath with: ? Epsom salts. Follow the instructions on the packaging. You can get these at your local pharmacy or grocery store. ? Baking soda. Pour a small amount into the bath as directed by your health care provider. ? Colloidal oatmeal. Follow the instructions on the packaging. You can get this at your local pharmacy or grocery store.  Try applying baking soda paste to your skin. Stir water into baking soda until it  reaches a paste-like consistency.  Do not scratch your skin.  Bathe less frequently, such as every other day.  Bathe in lukewarm water. Avoid using hot water. Medicines  Take or apply over-the-counter and prescription medicines only as told by your health care provider.  If you were prescribed an antibiotic medicine, take or apply your antibiotic as told by your health care provider. Do not stop using the antibiotic even if your condition starts to improve. General instructions  Keep all follow-up visits as told by your health care provider. This is important.  Avoid the  substance that caused your reaction. If you do not know what caused it, keep a journal to try to track what caused it. Write down: ? What you eat. ? What cosmetic products you use. ? What you drink. ? What you wear in the affected area. This includes jewelry.  If you were given a dressing, take care of it as told by your health care provider. This includes when to change and remove it. Contact a health care provider if:  Your condition does not improve with treatment.  Your condition gets worse.  You have signs of infection such as swelling, tenderness, redness, soreness, or warmth in the affected area.  You have a fever.  You have new symptoms. Get help right away if:  You have a severe headache, neck pain, or neck stiffness.  You vomit.  You feel very sleepy.  You notice red streaks coming from the affected area.  Your bone or joint underneath the affected area becomes painful after the skin has healed.  The affected area turns darker.  You have difficulty breathing. This information is not intended to replace advice given to you by your health care provider. Make sure you discuss any questions you have with your health care provider. Document Released: 08/09/2000 Document Revised: 01/18/2016 Document Reviewed: 12/28/2014 [employer] Interactive Patient Education  2018 [employer]

## 2017-06-24 NOTE — Progress Notes (Signed)
Subjective:    Patient ID: Rebecca Sweeney, female    DOB: May 01, 1961, 56 y.o.   MRN: [id number]  56y/o caucasian married female established Pt reports facial swelling and erythema to R neck she believes is an allergic reaction to an unknown allergen. Swelling began Sunday night felt bumps on neck/jawline as she went to bed but couldn't see anything and asked her spouse to check also  Was seen by RN Rolly Salter yesterday and instructed to take benadryl prn and apply ice due to left cheek/upper lip/neck rash/swelling.  Patient related her rash was very red and swollen/itchy yesterday much improved today. Took 3 doses of 25mg  Benadryl yesterday, last dose last night. Used ice pack on facial swelling. States itching and swelling have improved but are still partially present. Denied SOB, tongue/throat swelling or scratchiness, chest pain, wheezing, fever, chills, nausea, vomiting, headache.  Patient denied recent illness, starting new hygiene products/soaps/laundry detergents.  Patient is a smoker has not changed brands of cigarettes.  Has not taken out winter clothing yet from storage.      Review of Systems  Constitutional: Negative for activity change, appetite change, chills, diaphoresis, fatigue, fever and unexpected weight change.  HENT: Positive for facial swelling. Negative for congestion, hearing loss, mouth sores, nosebleeds, postnasal drip, rhinorrhea, sinus pain, sinus pressure, sneezing, sore throat, tinnitus, trouble swallowing and voice change.   Eyes: Positive for redness and itching. Negative for photophobia, pain, discharge and visual disturbance.  Respiratory: Negative for cough, chest tightness, shortness of breath, wheezing and stridor.   Cardiovascular: Negative for chest pain, palpitations and leg swelling.  Gastrointestinal: Negative for diarrhea, nausea and vomiting.  Endocrine: Negative for cold intolerance and heat intolerance.  Genitourinary: Negative for difficulty urinating,  dysuria and hematuria.  Musculoskeletal: Negative for back pain, gait problem, joint swelling, myalgias, neck pain and neck stiffness.  Skin: Positive for color change and rash. Negative for pallor and wound.  Allergic/Immunologic: Positive for environmental allergies. Negative for food allergies.  Neurological: Negative for dizziness, tremors, seizures, syncope, facial asymmetry, speech difficulty, weakness, light-headedness, numbness and headaches.  Hematological: Negative for adenopathy. Does not bruise/bleed easily.  Psychiatric/Behavioral: Negative for agitation, confusion and sleep disturbance.       Objective:   Physical Exam  Constitutional: She is oriented to person, place, and time. Vital signs are normal. She appears well-developed and well-nourished. She is active and cooperative.  Non-toxic appearance. She does not have a sickly appearance. She appears ill. No distress.  HENT:  Head: Normocephalic and atraumatic.  Right Ear: Hearing, external ear and ear canal normal. A middle ear effusion is present.  Left Ear: Hearing, external ear and ear canal normal. A middle ear effusion is present.  Nose: Nose normal. No mucosal edema, rhinorrhea, nose lacerations, sinus tenderness, nasal deformity, septal deviation or nasal septal hematoma. No epistaxis.  No foreign bodies. Right sinus exhibits no maxillary sinus tenderness and no frontal sinus tenderness. Left sinus exhibits no maxillary sinus tenderness and no frontal sinus tenderness.  Mouth/Throat: Uvula is midline, oropharynx is clear and moist and mucous membranes are normal. Mucous membranes are not pale, not dry and not cyanotic. She does not have dentures. No oral lesions. No trismus in the jaw. Normal dentition. No dental abscesses, uvula swelling, lacerations or dental caries. No oropharyngeal exudate, posterior oropharyngeal edema, posterior oropharyngeal erythema or tonsillar abscesses.  Bilateral TMs air fluid level clear;    Eyes: Pupils are equal, round, and reactive to light. Conjunctivae and EOM are normal. Right eye  exhibits no chemosis, no discharge, no exudate and no hordeolum. No foreign body present in the right eye. Left eye exhibits no chemosis, no discharge, no exudate and no hordeolum. No foreign body present in the left eye. Right conjunctiva is not injected. Right conjunctiva has no hemorrhage. Left conjunctiva is not injected. Left conjunctiva has no hemorrhage. No scleral icterus. Right eye exhibits normal extraocular motion and no nystagmus. Left eye exhibits normal extraocular motion and no nystagmus. Right pupil is round and reactive. Left pupil is round and reactive. Pupils are equal.  Bilateral lower eyelids puffy no erythema dry nontender  Neck: Trachea normal, normal range of motion and phonation normal. Neck supple. No tracheal tenderness, no spinous process tenderness and no muscular tenderness present. No neck rigidity. No tracheal deviation, no edema, no erythema and normal range of motion present. No thyroid mass and no thyromegaly present.  Cardiovascular: Normal rate, regular rhythm, S1 normal, S2 normal, normal heart sounds and intact distal pulses.  PMI is not displaced.  Exam reveals no gallop and no friction rub.   No murmur heard. Pulses:      Radial pulses are 2+ on the right side, and 2+ on the left side.  Pulmonary/Chest: Effort normal and breath sounds normal. No accessory muscle usage or stridor. No respiratory distress. She has no decreased breath sounds. She has no wheezes. She has no rhonchi. She has no rales. She exhibits no tenderness.  Abdominal: Soft. Normal appearance. She exhibits no distension, no fluid wave and no ascites. There is no rigidity and no guarding.  Musculoskeletal: Normal range of motion. She exhibits no edema or tenderness.       Right shoulder: Normal.       Left shoulder: Normal.       Right elbow: Normal.      Left elbow: Normal.       Right hip: Normal.        Left hip: Normal.       Right knee: Normal.       Left knee: Normal.       Cervical back: Normal.       Thoracic back: Normal.       Lumbar back: Normal.       Right hand: Normal.       Left hand: Normal.  Lymphadenopathy:       Head (right side): No submental, no submandibular, no tonsillar, no preauricular, no posterior auricular and no occipital adenopathy present.       Head (left side): No submental, no submandibular, no tonsillar, no preauricular, no posterior auricular and no occipital adenopathy present.    She has no cervical adenopathy.       Right cervical: No superficial cervical, no deep cervical and no posterior cervical adenopathy present.      Left cervical: No superficial cervical, no deep cervical and no posterior cervical adenopathy present.  Neurological: She is alert and oriented to person, place, and time. She has normal strength. She is not disoriented. She displays no atrophy and no tremor. No cranial nerve deficit or sensory deficit. She exhibits normal muscle tone. She displays no seizure activity. Coordination and gait normal. GCS eye subscore is 4. GCS verbal subscore is 5. GCS motor subscore is 6.  On/off exam table; in/out of chair without difficulty; gait sure and steady in hallway  Skin: Skin is warm, dry and intact. Rash noted. No abrasion, no bruising, no burn, no ecchymosis, no laceration, no lesion, no petechiae and no  purpura noted. Rash is macular, papular and maculopapular. Rash is not nodular, not pustular, not vesicular and not urticarial. She is not diaphoretic. There is erythema. No cyanosis. No pallor. Nails show no clubbing.     Psychiatric: She has a normal mood and affect. Her speech is normal and behavior is normal. Judgment and thought content normal. Cognition and memory are normal.  Nursing note and vitals reviewed.         Assessment & Plan:  A-contact dermatitis   P-Discussed may have been contaminant on cigarettes or handling  items at work something contaminated and she touched face/ate without washing hands.  prednisone 10mg  taper (60/50/40/30/20/10mg ) po daily with breakfast #21 RF0 dispensed from [facility].  Hydrocortisone 1% topical affected areas BID prn itching 3 UD given to patient from clinic stock.  Benadryl 50mg  po qhs prn breakthrough itching OTC  Zyrtec 10mg  po qam #7 OTC.  Medication as directed. Symptomatic therapy suggested. Warm to cool water soaks and/or oatmeal baths. Avoid itching to prevent secondary infection.  ER/call 911 if dyspnea/wheezing/SOB/chest pain/tongue swelling. Call or return to clinic as needed if these symptoms worsen or fail to improve as anticipated. Exitcare handout on contact dermatitis given to patient. Patient verbalized agreement and understanding of treatment plan and had no further questions at this time.  P2: Avoidance and hand washing.

## 2017-06-30 ENCOUNTER — Encounter: Payer: Self-pay | Admitting: Physician Assistant

## 2017-07-25 ENCOUNTER — Encounter: Payer: Self-pay | Admitting: Physician Assistant

## 2017-07-25 ENCOUNTER — Other Ambulatory Visit: Payer: Self-pay

## 2017-07-25 ENCOUNTER — Ambulatory Visit (INDEPENDENT_AMBULATORY_CARE_PROVIDER_SITE_OTHER): Payer: PRIVATE HEALTH INSURANCE | Admitting: Physician Assistant

## 2017-07-25 VITALS — BP 102/72 | HR 79 | Temp 98.7°F | Resp 18 | Ht 66.0 in | Wt 148.6 lb

## 2017-07-25 DIAGNOSIS — E782 Mixed hyperlipidemia: Secondary | ICD-10-CM | POA: Insufficient documentation

## 2017-07-25 DIAGNOSIS — E559 Vitamin D deficiency, unspecified: Secondary | ICD-10-CM | POA: Diagnosis not present

## 2017-07-25 DIAGNOSIS — E7800 Pure hypercholesterolemia, unspecified: Secondary | ICD-10-CM | POA: Diagnosis not present

## 2017-07-25 DIAGNOSIS — R1011 Right upper quadrant pain: Secondary | ICD-10-CM

## 2017-07-25 DIAGNOSIS — R5383 Other fatigue: Secondary | ICD-10-CM

## 2017-07-25 DIAGNOSIS — E785 Hyperlipidemia, unspecified: Secondary | ICD-10-CM | POA: Insufficient documentation

## 2017-07-25 DIAGNOSIS — L853 Xerosis cutis: Secondary | ICD-10-CM

## 2017-07-25 NOTE — Assessment & Plan Note (Signed)
Await lab results. Increase vitamin D dose if indicated.

## 2017-07-25 NOTE — Progress Notes (Signed)
Subjective:    Patient ID: Rebecca Sweeney, female    DOB: 10/05/1960, 56 y.o.   MRN: [id number]  HPI  Has been doing well since last visit.  Eats homemade meals. Eats a lot of fish, salads, fruits. Eats a lot of cheese. Gave up soda. Drinking more water. Does not exercise because works 10 hour shifts.  Her only concern today is RUQ pain and pressure for a week or two. Pain is 5-6/10 at its worse. Usually happens after eating once or twice a week. Describes it as cramping. Thinks its due to gas. Has not tried taking anything to help. Has one bowel movement per day. No blood in stool. No straining She states that she has been feeling very tired.  Mood has been good on Bupropion.  Review of Systems  Constitutional: Positive for fatigue. Negative for appetite change, fever and unexpected weight change.  HENT: Positive for rhinorrhea. Negative for congestion and sore throat.   Respiratory: Negative for cough and shortness of breath.   Cardiovascular: Negative for chest pain and palpitations.  Gastrointestinal: Positive for abdominal pain. Negative for blood in stool, constipation, diarrhea, nausea and vomiting.  Genitourinary: Negative for difficulty urinating.  Neurological: Negative for dizziness and headaches.   Patient Active Problem List   Diagnosis Date Noted  . Hyperlipidemia 07/25/2017  . Vitamin D deficiency 07/25/2017  . Hormone replacement therapy (HRT) 06/09/2017  . 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 Seasonal allergic rhinitis 11/21/2016    Outpatient Encounter Medications as of 07/25/2017  Medication Sig  . buPROPion (WELLBUTRIN XL) 150 MG 24 hr tablet Take 150 mg by mouth daily.  . drospirenone-estradiol (ANGELIQ) 0.5-1 MG tablet Take 1 tablet by mouth daily.  . montelukast (SINGULAIR) 10 MG tablet Take 10 mg by mouth at bedtime.  . cetirizine (ZYRTEC) 10 MG tablet Take 1 tablet (10 mg total) by mouth daily. (Patient not taking: Reported on 07/25/2017)  .  diphenhydrAMINE (BENADRYL) 50 MG tablet Take 1 tablet (50 mg total) by mouth at bedtime as needed for itching.  . fluticasone (FLONASE) 50 MCG/ACT nasal spray Place 2 sprays into both nostrils daily.  Marland Kitchen. loratadine (CLARITIN) 10 MG tablet Take 1 tablet (10 mg total) by mouth daily. (Patient not taking: Reported on 07/25/2017)  . predniSONE (STERAPRED UNI-PAK 21 TAB) 10 MG (21) TBPK tablet Taper take 6 pills by mouth day 1; 5 pills day 2; 4 pills day 3; 3 pills day 4; 2 pills day 5 and 1 pill day 6 with breakfast daily (Patient not taking: Reported on 07/25/2017)  . ranitidine (ZANTAC) 150 MG tablet Take 1 tablet (150 mg total) by mouth 2 (two) times daily.   No facility-administered encounter medications on file as of 07/25/2017.       No Known Allergies  Objective:   Physical Exam  Constitutional: She appears well-developed and well-nourished. No distress.  HENT:  Head: Normocephalic and atraumatic.  Mouth/Throat: Oropharynx is clear and moist.  Cardiovascular: Normal rate, regular rhythm, normal heart sounds and intact distal pulses.  Pulmonary/Chest: Effort normal and breath sounds normal.  Abdominal: Soft. Bowel sounds are normal. There is no tenderness.  Lymphadenopathy:    She has no cervical adenopathy.  Neurological: She is alert.  Skin: Skin is dry.  Psychiatric: She has a normal mood and affect.      Assessment & Plan:  1. Pure hypercholesterolemia 2. RUQ abdominal pain 3. Fatigue, unspecified type 4. Dry skin - Ordered CBC w/ diff, CMP, Lipid panel, and Vitamin D  1,24 dihydroxy -Ordered RUQ abdominal ultrasound. - Patient education and instructions provided. -Follow up in 6 months for re-evaluation of cholesterol and fasting labs  [employer] CorporationFaithcaren W Jester Klingberg, Student-PA

## 2017-07-25 NOTE — Assessment & Plan Note (Signed)
Continue efforts for healthier eating habits. Try using the exercise room at her work instead of taking smoking breaks. Await lab results.

## 2017-07-25 NOTE — Patient Instructions (Addendum)
Drink at least 64 ounces of water daily. Consider a humidifier for the room where you sleep. Bathe once daily. Avoid using HOT water, as it dries skin. Avoid deodorant soaps (Dial is the worst!) and stick with gentle cleansers (I like Cetaphil Liquid Cleanser). After bathing, dry off completely, then apply a thick emollient cream (I like Cetaphil Moisturizing Cream). Apply the cream twice daily, or more!  Try OTC simethicone (Mylanta Gas or Bean-O) with meals to reduce gas. Call [facility] to schedule colon:cancer screening. [phone]  Instead of taking a smoke break, use the time to EXERCISE!  IF you received an x-ray today, you will receive an invoice from [facility]. Please contact [facility] at [phone] with questions or concerns regarding your invoice.   IF you received labwork today, you will receive an invoice from [facility]. Please contact [facility] at [phone] with questions or concerns regarding your invoice.   Our billing staff will not be able to assist you with questions regarding bills from these companies.  You will be contacted with the lab results as soon as they are available. The fastest way to get your results is to activate your My Chart account. Instructions are located on the last page of this paperwork. If you have not heard from us regarding the results in 2 weeks, please contact this office.

## 2017-07-25 NOTE — Progress Notes (Signed)
Patient ID: Rebecca ChamberSonia Sweeney, female    DOB: Dec 06, 1960, [age] y.o.   MRN: [id number]  PCP: Rebecca Sweeney, Rebecca Donald, PA-C  Chief Complaint  Patient presents with  . Hyperlipidemia  . Fasting Labs  . Follow-up    Subjective:   Presents for evaluation of hyperlipidemia.  Has been working on healthy eating, though Rebecca Sweeney consumes a lot of cheese. Does not get exercise, due to long shifts at work.  RUQ abdominal pain, cramping, after eating, about twice a week. Thinks it's due to gas. Has tried nothing to help Rebecca Sweeney symptoms.  Tired lately. Vitamin D was 21 in August. Has been taking an OTC vitamin D supplement, and wonders if Rebecca Sweeney needs a presription. TSH was normal in August.  Dry skin.  Referred for colon cancer screening in August, but Rebecca Sweeney did not return calls to schedule. Rebecca Sweeney relates that Rebecca Sweeney did not receive any messages.  Review of Systems No CP, SOB, HA, dizziness, nausea, vomiting, diarrhea. No new muscle or joint pain. No rash.    Patient Active Problem List   Diagnosis Date Noted  . Hyperlipidemia 07/25/2017  . Hormone replacement therapy (HRT) 06/09/2017  . 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 Seasonal allergic rhinitis 11/21/2016     Prior to Admission medications   Medication Sig Start Date End Date Taking? Authorizing Provider  buPROPion (WELLBUTRIN XL) 150 MG 24 hr tablet Take 150 mg by mouth daily.   Yes [provider]  drospirenone-estradiol (ANGELIQ) 0.5-1 MG tablet Take 1 tablet by mouth daily.   Yes [provider]  montelukast (SINGULAIR) 10 MG tablet Take 10 mg by mouth at bedtime.   Yes [provider]  cetirizine (ZYRTEC) 10 MG tablet Take 1 tablet (10 mg total) by mouth daily. Patient not taking: Reported on 07/25/2017 06/24/17   Barbaraann BarthelBetancourt, Tina A, NP  diphenhydrAMINE (BENADRYL) 50 MG tablet Take 1 tablet (50 mg total) by mouth at bedtime as needed for itching. 06/24/17 07/01/17  Betancourt, Jarold Songina A, NP  fluticasone (FLONASE) 50  MCG/ACT nasal spray Place 2 sprays into both nostrils daily.    [provider]  loratadine (CLARITIN) 10 MG tablet Take 1 tablet (10 mg total) by mouth daily. Patient not taking: Reported on 07/25/2017 11/21/16   Barbaraann BarthelBetancourt, Tina A, NP  predniSONE (STERAPRED UNI-PAK 21 TAB) 10 MG (21) TBPK tablet Taper take 6 pills by mouth day 1; 5 pills day 2; 4 pills day 3; 3 pills day 4; 2 pills day 5 and 1 pill day 6 with breakfast daily Patient not taking: Reported on 07/25/2017 06/24/17   Barbaraann BarthelBetancourt, Tina A, NP  ranitidine (ZANTAC) 150 MG tablet Take 1 tablet (150 mg total) by mouth 2 (two) times daily. 06/24/17 07/01/17  Betancourt, Jarold Songina A, NP     No Known Allergies     Objective:  Physical Exam  Constitutional: Rebecca Sweeney is oriented to person, place, and time. Rebecca Sweeney appears well-developed and well-nourished. Rebecca Sweeney is active and cooperative. No distress.  BP 102/72 (BP Location: Left Arm, Patient Position: Sitting, Cuff Size: Normal)   Pulse 79   Temp 98.7 F (37.1 C) (Oral)   Resp 18   Ht 5\' 6"  (1.676 m)   Wt 148 lb 9.6 oz (67.4 kg)   LMP 03/18/2012   SpO2 97%   BMI 23.98 kg/m   HENT:  Head: Normocephalic and atraumatic.  Right Ear: Hearing normal.  Left Ear: Hearing normal.  Eyes: Conjunctivae are normal. No scleral icterus.  Neck: Normal range of motion. Neck  supple. No thyromegaly present.  Cardiovascular: Normal rate, regular rhythm and normal heart sounds.  Pulses:      Radial pulses are 2+ on the right side, and 2+ on the left side.  Pulmonary/Chest: Effort normal and breath sounds normal.  Abdominal: Soft. Normal appearance, normal aorta and bowel sounds are normal. Rebecca Sweeney exhibits no mass. There is no hepatosplenomegaly. There is tenderness in the right upper quadrant. There is no rigidity, no rebound, no guarding and no CVA tenderness.  Lymphadenopathy:       Head (right side): No tonsillar, no preauricular, no posterior auricular and no occipital adenopathy present.       Head  (left side): No tonsillar, no preauricular, no posterior auricular and no occipital adenopathy present.    Rebecca Sweeney has no cervical adenopathy.       Right: No supraclavicular adenopathy present.       Left: No supraclavicular adenopathy present.  Neurological: Rebecca Sweeney is alert and oriented to person, place, and time. No sensory deficit.  Skin: Skin is warm, dry and intact. No rash noted. No cyanosis or erythema. Nails show no clubbing.  Psychiatric: Rebecca Sweeney has a normal mood and affect. Rebecca Sweeney speech is normal and behavior is normal.       Assessment & Plan:   Problem List Items Addressed This Visit    Hyperlipidemia - Primary    Continue efforts for healthier eating habits. Try using the exercise room at Rebecca Sweeney work instead of taking smoking breaks. Await lab results.      Relevant Orders   Comprehensive metabolic panel   Lipid panel   Vitamin D deficiency    Await lab results. Increase vitamin D dose if indicated.      Relevant Orders   Vitamin D, 25-hydroxy    Other Visit Diagnoses    Fatigue, unspecified type       Await labs. Increase exercise.   Relevant Orders   CBC with Differential/Platelet   Dry skin       Dry Skin hygeine reviewed.   Abdominal pain, right upper quadrant       Await RUQ US. Try simethicone in the interim.   Relevant Orders   US Abdomen Limited RUQ       Return in about 6 months (around 01/22/2018) for re-evalaution of cholesterol and fasting labs.   Fernande Brashelle S. Angely Dietz, PA-C [facility]

## 2017-07-26 LAB — CBC WITH DIFFERENTIAL/PLATELET
BASOS: 0 %
Basophils Absolute: 0 10*3/uL (ref 0.0–0.2)
EOS (ABSOLUTE): 0.1 10*3/uL (ref 0.0–0.4)
EOS: 2 %
HEMATOCRIT: 38.1 % (ref 34.0–46.6)
Hemoglobin: 12.6 g/dL (ref 11.1–15.9)
IMMATURE GRANULOCYTES: 0 %
Immature Grans (Abs): 0 10*3/uL (ref 0.0–0.1)
LYMPHS ABS: 2.9 10*3/uL (ref 0.7–3.1)
Lymphs: 42 %
MCH: 32.5 pg (ref 26.6–33.0)
MCHC: 33.1 g/dL (ref 31.5–35.7)
MCV: 98 fL — AB (ref 79–97)
MONOS ABS: 0.4 10*3/uL (ref 0.1–0.9)
Monocytes: 6 %
NEUTROS ABS: 3.4 10*3/uL (ref 1.4–7.0)
NEUTROS PCT: 50 %
PLATELETS: 321 10*3/uL (ref 150–379)
RBC: 3.88 x10E6/uL (ref 3.77–5.28)
RDW: 13 % (ref 12.3–15.4)
WBC: 6.8 10*3/uL (ref 3.4–10.8)

## 2017-07-26 LAB — COMPREHENSIVE METABOLIC PANEL
ALK PHOS: 43 IU/L (ref 39–117)
ALT: 26 IU/L (ref 0–32)
AST: 17 IU/L (ref 0–40)
Albumin/Globulin Ratio: 2 (ref 1.2–2.2)
Albumin: 4.3 g/dL (ref 3.5–5.5)
BUN/Creatinine Ratio: 21 (ref 9–23)
BUN: 16 mg/dL (ref 6–24)
Bilirubin Total: 0.4 mg/dL (ref 0.0–1.2)
CALCIUM: 9.7 mg/dL (ref 8.7–10.2)
CO2: 21 mmol/L (ref 20–29)
Chloride: 108 mmol/L — ABNORMAL HIGH (ref 96–106)
Creatinine, Ser: 0.76 mg/dL (ref 0.57–1.00)
GFR calc Af Amer: 102 mL/min/{1.73_m2} (ref >59)
GFR, EST NON AFRICAN AMERICAN: 89 mL/min/{1.73_m2} (ref >59)
Globulin, Total: 2.2 g/dL (ref 1.5–4.5)
Glucose: 88 mg/dL (ref 65–99)
POTASSIUM: 4.6 mmol/L (ref 3.5–5.2)
SODIUM: 145 mmol/L — AB (ref 134–144)
Total Protein: 6.5 g/dL (ref 6.0–8.5)

## 2017-07-26 LAB — LIPID PANEL
CHOLESTEROL TOTAL: 303 mg/dL — AB (ref 100–199)
Chol/HDL Ratio: 5.9 ratio — ABNORMAL HIGH (ref 0.0–4.4)
HDL: 51 mg/dL (ref >39)
LDL CALC: 223 mg/dL — AB (ref 0–99)
TRIGLYCERIDES: 145 mg/dL (ref 0–149)
VLDL CHOLESTEROL CAL: 29 mg/dL (ref 5–40)

## 2017-07-26 LAB — VITAMIN D 25 HYDROXY (VIT D DEFICIENCY, FRACTURES): Vit D, 25-Hydroxy: 21.1 ng/mL — ABNORMAL LOW (ref 30.0–100.0)

## 2017-08-18 ENCOUNTER — Other Ambulatory Visit: Payer: Self-pay | Admitting: Physician Assistant

## 2017-08-18 MED ORDER — ATORVASTATIN CALCIUM 20 MG PO TABS: 20.0000 mg | ORAL_TABLET | Freq: Every day | ORAL | 3 refills | Status: DC

## 2017-09-09 ENCOUNTER — Ambulatory Visit: Payer: Self-pay | Admitting: Registered Nurse

## 2017-09-09 ENCOUNTER — Encounter: Payer: Self-pay | Admitting: Registered Nurse

## 2017-09-09 VITALS — BP 118/92 | HR 83 | Temp 98.3°F | Resp 16

## 2017-09-09 DIAGNOSIS — J209 Acute bronchitis, unspecified: Secondary | ICD-10-CM

## 2017-09-09 DIAGNOSIS — J0101 Acute recurrent maxillary sinusitis: Secondary | ICD-10-CM

## 2017-09-09 MED ORDER — SALINE SPRAY 0.65 % NA SOLN: 2.0000 | NASAL | 0 refills | Status: DC

## 2017-09-09 MED ORDER — ACETAMINOPHEN 500 MG PO TABS: 1000.0000 mg | ORAL_TABLET | Freq: Four times a day (QID) | ORAL | 0 refills | Status: AC | PRN

## 2017-09-09 MED ORDER — FLUTICASONE PROPIONATE 50 MCG/ACT NA SUSP: 1.0000 | Freq: Two times a day (BID) | NASAL | 0 refills | Status: DC

## 2017-09-09 MED ORDER — BENZONATATE 200 MG PO CAPS: 200.0000 mg | ORAL_CAPSULE | Freq: Two times a day (BID) | ORAL | 0 refills | Status: DC | PRN

## 2017-09-09 MED ORDER — PREDNISONE 10 MG (21) PO TBPK: ORAL_TABLET | ORAL | 0 refills | Status: DC

## 2017-09-09 NOTE — Progress Notes (Signed)
Subjective:    Patient ID: Rebecca Sweeney, female    DOB: 10-03-1960, 57 y.o.   MRN: [id number]  57y/o caucasian female established patient c/o sinus and chest congestion, productive cough x1 week, fatigue, not sleeping well due to cough. Still smoking 5-6 cigarettes per day.  Sick contacts at work.  Denied wheezing.  PCM had started her on cholesterol medication and she has noticed increased muscle aches her whole body.  Stopped her cholesterol medication last week still having body aches.  Not using her flonase or saline/allergy medication every day.      Review of Systems  Constitutional: Positive for fatigue. Negative for activity change, appetite change, chills, diaphoresis, fever and unexpected weight change.  HENT: Positive for congestion, postnasal drip, rhinorrhea, sinus pressure and sinus pain. Negative for dental problem, drooling, ear discharge, ear pain, facial swelling, hearing loss, mouth sores, nosebleeds, sneezing, sore throat, tinnitus, trouble swallowing and voice change.   Eyes: Negative for photophobia, pain, discharge, redness, itching and visual disturbance.  Respiratory: Positive for cough. Negative for choking, chest tightness, shortness of breath, wheezing and stridor.   Cardiovascular: Negative for chest pain, palpitations and leg swelling.  Gastrointestinal: Negative for abdominal distention, abdominal pain, blood in stool, constipation, diarrhea, nausea and vomiting.  Endocrine: Negative for cold intolerance and heat intolerance.  Genitourinary: Negative for difficulty urinating, dysuria and hematuria.  Musculoskeletal: Negative for arthralgias, back pain, gait problem, joint swelling, myalgias, neck pain and neck stiffness.  Skin: Negative for color change, pallor, rash and wound.  Allergic/Immunologic: Positive for environmental allergies. Negative for food allergies.  Neurological: Positive for headaches. Negative for dizziness, tremors, seizures, syncope, facial  asymmetry, speech difficulty, weakness, light-headedness and numbness.  Hematological: Negative for adenopathy. Does not bruise/bleed easily.  Psychiatric/Behavioral: Positive for sleep disturbance. Negative for agitation, behavioral problems and confusion.       Objective:   Physical Exam  Constitutional: She is oriented to person, place, and time. She appears well-developed and well-nourished. She is active and cooperative.  Non-toxic appearance. She does not have a sickly appearance. She appears ill. No distress.  HENT:  Head: Normocephalic and atraumatic.  Right Ear: Hearing, external ear and ear canal normal. A middle ear effusion is present.  Left Ear: Hearing, external ear and ear canal normal. A middle ear effusion is present.  Nose: Mucosal edema and rhinorrhea present. No nose lacerations, sinus tenderness, nasal deformity, septal deviation or nasal septal hematoma. No epistaxis.  No foreign bodies. Right sinus exhibits maxillary sinus tenderness. Right sinus exhibits no frontal sinus tenderness. Left sinus exhibits maxillary sinus tenderness. Left sinus exhibits no frontal sinus tenderness.  Mouth/Throat: Uvula is midline and mucous membranes are normal. Mucous membranes are not pale, not dry and not cyanotic. She does not have dentures. No oral lesions. No trismus in the jaw. Normal dentition. No dental abscesses, uvula swelling, lacerations or dental caries. Posterior oropharyngeal edema and posterior oropharyngeal erythema present. No oropharyngeal exudate or tonsillar abscesses.  Cobblestoning posterior pharynx; bilateral TMs air fluid level clear; bilateral nasal turbinates edema/erythema clear discharge; bilateral allergic shiners  Eyes: Conjunctivae, EOM and lids are normal. Pupils are equal, round, and reactive to light. Right eye exhibits no chemosis, no discharge, no exudate and no hordeolum. No foreign body present in the right eye. Left eye exhibits no chemosis, no discharge,  no exudate and no hordeolum. No foreign body present in the left eye. Right conjunctiva is not injected. Right conjunctiva has no hemorrhage. Left conjunctiva is not injected. Left  conjunctiva has no hemorrhage. No scleral icterus. Right eye exhibits normal extraocular motion and no nystagmus. Left eye exhibits normal extraocular motion and no nystagmus. Right pupil is round and reactive. Left pupil is round and reactive. Pupils are equal.  Neck: Trachea normal, normal range of motion and phonation normal. Neck supple. No tracheal tenderness and no muscular tenderness present. No neck rigidity. No tracheal deviation, no edema, no erythema and normal range of motion present. No thyroid mass and no thyromegaly present.  Cardiovascular: Normal rate, regular rhythm, S1 normal, S2 normal, normal heart sounds and intact distal pulses. PMI is not displaced. Exam reveals no gallop and no friction rub.  No murmur heard. Pulmonary/Chest: Effort normal and breath sounds normal. No accessory muscle usage or stridor. No respiratory distress. She has no decreased breath sounds. She has no wheezes. She has no rhonchi. She has no rales. She exhibits no tenderness.  Harsh cough observed in exam room; spoke full sentences without difficulty after coughing ceased  Abdominal: Soft. Normal appearance. She exhibits no distension, no fluid wave and no ascites. There is no rigidity and no guarding.  Musculoskeletal: Normal range of motion. She exhibits no edema or tenderness.       Right shoulder: Normal.       Left shoulder: Normal.       Right elbow: Normal.      Left elbow: Normal.       Right hip: Normal.       Left hip: Normal.       Right knee: Normal.       Left knee: Normal.       Cervical back: Normal.       Thoracic back: Normal.       Lumbar back: Normal.       Right hand: Normal.       Left hand: Normal.  Lymphadenopathy:       Head (right side): No submental, no submandibular, no tonsillar, no  preauricular, no posterior auricular and no occipital adenopathy present.       Head (left side): No submental, no submandibular, no tonsillar, no preauricular, no posterior auricular and no occipital adenopathy present.    She has no cervical adenopathy.       Right cervical: No superficial cervical, no deep cervical and no posterior cervical adenopathy present.      Left cervical: No superficial cervical, no deep cervical and no posterior cervical adenopathy present.  Neurological: She is alert and oriented to person, place, and time. She has normal strength. She is not disoriented. She displays no atrophy and no tremor. No cranial nerve deficit or sensory deficit. She exhibits normal muscle tone. She displays no seizure activity. Coordination and gait normal. GCS eye subscore is 4. GCS verbal subscore is 5. GCS motor subscore is 6.  On/off exam table; in/out of chair without difficulty; gait sure and steady in hallway  Skin: Skin is warm, dry and intact. No abrasion, no bruising, no burn, no ecchymosis, no laceration, no lesion, no petechiae and no rash noted. She is not diaphoretic. No cyanosis or erythema. No pallor. Nails show no clubbing.  Psychiatric: She has a normal mood and affect. Her speech is normal and behavior is normal. Judgment and thought content normal. Cognition and memory are normal.  Nursing note and vitals reviewed.         Assessment & Plan:  A-acute bronchitis, maxillary sinusitis recurrent acute  P-Restart flonase 1 spray each nostril BID #1 RF6 electronic  Rx to pharmacy of choice, saline 2 sprays each nostril q2h wa prn congestion 1 bottl egiven from clinic stock.  If no improvement with 48 hours of saline and flonase notify me and will consider antibiotic.  Denied personal or family history of ENT cancer.  Shower BID especially prior to bed. No evidence of systemic bacterial infection, non toxic and well hydrated.  I do not see where any further testing or imaging is  necessary at this time.  Consider restarting singulair 10mg  po qhs and claritin/zyrtec 10mg  po daily also.   I will suggest supportive care, rest, good hygiene and encourage the patient to take adequate fluids.  The patient is to return to clinic or EMERGENCY ROOM if symptoms worsen or change significantly.  Exitcare handout on sinusitis and sinus rinse given to patient.  Patient verbalized agreement and understanding of treatment plan and had no further questions at this time.   P2:  Hand washing and cover cough    Stop smoking.  Cough lozenges po q2h prn cough given 8 UD from clinic stock.  Prednisone taper 10mg  (60/50/40/30/20/10mg ) po daily with breakfast #21 RF0 dispensed from [facility].  Discussed possible side effects increased/decreased appetite, difficulty sleeping, increased blood sugar, increased blood pressure and heart rate.  Consider Albuterol MDI 1-2 puffs po q4-6h prn protracted cough/wheeze #1 RF0 side effect increased heart rate. Bronchitis simple, community acquired, may have started as viral (probably respiratory syncytial, parainfluenza, influenza, or adenovirus), but now evidence of acute purulent bronchitis with resultant bronchial edema and mucus formation.  Viruses are the most common cause of bronchial inflammation in otherwise healthy adults with acute bronchitis.  The appearance of sputum is not predictive of whether a bacterial infection is present.  Purulent sputum is most often caused by viral infections.  There are a small portion of those caused by non-viral agents being Mycoplama pneumonia.  Microscopic examination or C&S of sputum in the healthy adult with acute bronchitis is generally not helpful (usually negative or normal respiratory flora) other considerations being cough from upper respiratory tract infections, sinusitis or allergic syndromes (mild asthma or viral pneumonia).  Differential Diagnoses:  reactive airway disease (asthma, allergic aspergillosis (eosinophilia),  chronic bronchitis, respiratory infection (sinusitis, common cold, pneumonia), congestive heart failure, reflux esophagitis, bronchogenic tumor, aspiration syndromes and/or exposure to pulmonary irritants/smoke.  Without high fever, severe dyspnea, lack of physical findings or other risk factors, I will hold on a chest radiograph and CBC at this time.  I discussed that approximately 50% of patients with acute bronchitis have a cough that lasts up to three weeks, and 25% for over a month.  Tylenol 500mg  one to two tablets every four to six hours as needed for fever or myalgias.  No aspirin. Exitcare handout on bronchitis and tobacco risks given to patient.  ER if hemopthysis, SOB, worst chest pain of life.   Patient instructed to follow up in one week or sooner if symptoms worsen.  Patient verbalized agreement and understanding of treatment plan.  P2:  hand washing and cover cough

## 2017-09-09 NOTE — Patient Instructions (Signed)
Health Risks of Smoking Smoking cigarettes is very bad for your health. Tobacco smoke has over 200 known poisons in it. It contains the poisonous gases nitrogen oxide and carbon monoxide. There are over 60 chemicals in tobacco smoke that cause cancer. Smoking is difficult to quit because a chemical in tobacco, called nicotine, causes addiction or dependence. When you smoke and inhale, nicotine is absorbed rapidly into the bloodstream through your lungs. Both inhaled and non-inhaled nicotine may be addictive. What are the risks of cigarette smoke? Cigarette smokers have an increased risk of many serious medical problems, including:  Lung cancer.  Lung disease, such as pneumonia, bronchitis, and emphysema.  Chest pain (angina) and heart attack because the heart is not getting enough oxygen.  Heart disease and peripheral blood vessel disease.  High blood pressure (hypertension).  Stroke.  Oral cancer, including cancer of the lip, mouth, or voice box.  Bladder cancer.  Pancreatic cancer.  Cervical cancer.  Pregnancy complications, including premature birth.  Stillbirths and smaller newborn babies, birth defects, and genetic damage to sperm.  Early menopause.  Lower estrogen level for women.  Infertility.  Facial wrinkles.  Blindness.  Increased risk of broken bones (fractures).  Senile dementia.  Stomach ulcers and internal bleeding.  Delayed wound healing and increased risk of complications during surgery.  Even smoking lightly shortens your life expectancy by several years.  Because of secondhand smoke exposure, children of smokers have an increased risk of the following:  Sudden infant death syndrome (SIDS).  Respiratory infections.  Lung cancer.  Heart disease.  Ear infections.  What are the benefits of quitting? There are many health benefits of quitting smoking. Here are some of them:  Within days of quitting smoking, your risk of having a heart  attack decreases, your blood flow improves, and your lung capacity improves. Blood pressure, pulse rate, and breathing patterns start returning to normal soon after quitting.  Within months, your lungs may clear up completely.  Quitting for 10 years reduces your risk of developing lung cancer and heart disease to almost that of a nonsmoker.  People who quit may see an improvement in their overall quality of life.  How do I quit smoking? Smoking is an addiction with both physical and psychological effects, and longtime habits can be hard to change. Your health care provider can recommend:  Programs and community resources, which may include group support, education, or talk therapy.  Prescription medicines to help reduce cravings.  Nicotine replacement products, such as patches, gum, and nasal sprays. Use these products only as directed. Do not replace cigarette smoking with electronic cigarettes, which are commonly called e-cigarettes. The safety of e-cigarettes is not known, and some may contain harmful chemicals.  A combination of two or more of these methods.  Where to find more information:  American Lung Association: www.lung.org  American Cancer Society: www.cancer.org Summary  Smoking cigarettes is very bad for your health. Cigarette smokers have an increased risk of many serious medical problems, including several cancers, heart disease, and stroke.  Smoking is an addiction with both physical and psychological effects, and longtime habits can be hard to change.  By stopping right away, you can greatly reduce the risk of medical problems for you and your family.  To help you quit smoking, your health care provider can recommend programs, community resources, prescription medicines, and nicotine replacement products such as patches, gum, and nasal sprays. This information is not intended to replace advice given to you by your health   care provider. Make sure you discuss any  questions you have with your health care provider. Document Released: 09/19/2004 Document Revised: 08/16/2016 Document Reviewed: 08/16/2016 Elsevier Interactive Patient Education  2017 [employer] Sinusitis, Adult Sinusitis is soreness and inflammation of your sinuses. Sinuses are hollow spaces in the bones around your face. Your sinuses are located:  Around your eyes.  In the middle of your forehead.  Behind your nose.  In your cheekbones.  Your sinuses and nasal passages are lined with a stringy fluid (mucus). Mucus normally drains out of your sinuses. When your nasal tissues become inflamed or swollen, the mucus can become trapped or blocked so air cannot flow through your sinuses. This allows bacteria, viruses, and funguses to grow, which leads to infection. Sinusitis can develop quickly and last for 7?10 days (acute) or for more than 12 weeks (chronic). Sinusitis often develops after a cold. What are the causes? This condition is caused by anything that creates swelling in the sinuses or stops mucus from draining, including:  Allergies.  Asthma.  Bacterial or viral infection.  Abnormally shaped bones between the nasal passages.  Nasal growths that contain mucus (nasal polyps).  Narrow sinus openings.  Pollutants, such as chemicals or irritants in the air.  A foreign object stuck in the nose.  A fungal infection. This is rare.  What increases the risk? The following factors may make you more likely to develop this condition:  Having allergies or asthma.  Having had a recent cold or respiratory tract infection.  Having structural deformities or blockages in your nose or sinuses.  Having a weak immune system.  Doing a lot of swimming or diving.  Overusing nasal sprays.  Smoking.  What are the signs or symptoms? The main symptoms of this condition are pain and a feeling of pressure around the affected sinuses. Other symptoms include:  Upper  toothache.  Earache.  Headache.  Bad breath.  Decreased sense of smell and taste.  A cough that may get worse at night.  Fatigue.  Fever.  Thick drainage from your nose. The drainage is often green and it may contain pus (purulent).  Stuffy nose or congestion.  Postnasal drip. This is when extra mucus collects in the throat or back of the nose.  Swelling and warmth over the affected sinuses.  Sore throat.  Sensitivity to light.  How is this diagnosed? This condition is diagnosed based on symptoms, a medical history, and a physical exam. To find out if your condition is acute or chronic, your health care provider may:  Look in your nose for signs of nasal polyps.  Tap over the affected sinus to check for signs of infection.  View the inside of your sinuses using an imaging device that has a light attached (endoscope).  If your health care provider suspects that you have chronic sinusitis, you may also:  Be tested for allergies.  Have a sample of mucus taken from your nose (nasal culture) and checked for bacteria.  Have a mucus sample examined to see if your sinusitis is related to an allergy.  If your sinusitis does not respond to treatment and it lasts longer than 8 weeks, you may have an MRI or CT scan to check your sinuses. These scans also help to determine how severe your infection is. In rare cases, a bone biopsy may be done to rule out more serious types of fungal sinus disease. How is this treated? Treatment for sinusitis depends on the cause and whether  your condition is chronic or acute. If a virus is causing your sinusitis, your symptoms will go away on their own within 10 days. You may be given medicines to relieve your symptoms, including:  Topical nasal decongestants. They shrink swollen nasal passages and let mucus drain from your sinuses.  Antihistamines. These drugs block inflammation that is triggered by allergies. This can help to ease swelling in  your nose and sinuses.  Topical nasal corticosteroids. These are nasal sprays that ease inflammation and swelling in your nose and sinuses.  Nasal saline washes. These rinses can help to get rid of thick mucus in your nose.  If your condition is caused by bacteria, you will be given an antibiotic medicine. If your condition is caused by a fungus, you will be given an antifungal medicine. Surgery may be needed to correct underlying conditions, such as narrow nasal passages. Surgery may also be needed to remove polyps. Follow these instructions at home: Medicines  Take, use, or apply over-the-counter and prescription medicines only as told by your health care provider. These may include nasal sprays.  If you were prescribed an antibiotic medicine, take it as told by your health care provider. Do not stop taking the antibiotic even if you start to feel better. Hydrate and Humidify  Drink enough water to keep your urine clear or pale yellow. Staying hydrated will help to thin your mucus.  Use a cool mist humidifier to keep the humidity level in your home above 50%.  Inhale steam for 10-15 minutes, 3-4 times a day or as told by your health care provider. You can do this in the bathroom while a hot shower is running.  Limit your exposure to cool or dry air. Rest  Rest as much as possible.  Sleep with your head raised (elevated).  Make sure to get enough sleep each night. General instructions  Apply a warm, moist washcloth to your face 3-4 times a day or as told by your health care provider. This will help with discomfort.  Wash your hands often with soap and water to reduce your exposure to viruses and other germs. If soap and water are not available, use hand sanitizer.  Do not smoke. Avoid being around people who are smoking (secondhand smoke).  Keep all follow-up visits as told by your health care provider. This is important. Contact a health care provider if:  You have a  fever.  Your symptoms get worse.  Your symptoms do not improve within 10 days. Get help right away if:  You have a severe headache.  You have persistent vomiting.  You have pain or swelling around your face or eyes.  You have vision problems.  You develop confusion.  Your neck is stiff.  You have trouble breathing. This information is not intended to replace advice given to you by your health care provider. Make sure you discuss any questions you have with your health care provider. Document Released: 08/12/2005 Document Revised: 04/07/2016 Document Reviewed: 06/07/2015 Elsevier Interactive Patient Education  2018 [employer] Sinus Rinse What is a sinus rinse? A sinus rinse is a simple home treatment that is used to rinse your sinuses with a sterile mixture of salt and water (saline solution). Sinuses are air-filled spaces in your skull behind the bones of your face and forehead that open into your nasal cavity. You will use the following:  Saline solution.  Neti pot or spray bottle. This releases the saline solution into your nose and through your sinuses.  Neti pots and spray bottles can be purchased at [employer], a health food store, or online.  When would I do a sinus rinse? A sinus rinse can help to clear mucus, dirt, dust, or pollen from the nasal cavity. You may do a sinus rinse when you have a cold, a virus, nasal allergy symptoms, a sinus infection, or stuffiness in the nose or sinuses. If you are considering a sinus rinse:  Ask your child's health care provider before performing a sinus rinse on your child.  Do not do a sinus rinse if you have had ear or nasal surgery, ear infection, or blocked ears.  How do I do a sinus rinse?  Wash your hands.  Disinfect your device according to the directions provided and then dry it.  Use the solution that comes with your device or one that is sold separately in stores. Follow the mixing directions on the  package.  Fill your device with the amount of saline solution as directed by the device instructions.  Stand over a sink and tilt your head sideways over the sink.  Place the spout of the device in your upper nostril (the one closer to the ceiling).  Gently pour or squeeze the saline solution into the nasal cavity. The liquid should drain to the lower nostril if you are not overly congested.  Gently blow your nose. Blowing too hard may cause ear pain.  Repeat in the other nostril.  Clean and rinse your device with clean water and then air-dry it. Are there risks of a sinus rinse? Sinus rinse is generally very safe and effective. However, there are a few risks, which include:  A burning sensation in the sinuses. This may happen if you do not make the saline solution as directed. Make sure to follow all directions when making the saline solution.  Infection from contaminated water. This is rare, but possible.  Nasal irritation.  This information is not intended to replace advice given to you by your health care provider. Make sure you discuss any questions you have with your health care provider. Document Released: 03/09/2014 Document Revised: 07/09/2016 Document Reviewed: 12/28/2013 Elsevier Interactive Patient Education  2017 [employer] Acute Bronchitis, Adult Acute bronchitis is sudden (acute) swelling of the air tubes (bronchi) in the lungs. Acute bronchitis causes these tubes to fill with mucus, which can make it hard to breathe. It can also cause coughing or wheezing. In adults, acute bronchitis usually goes away within 2 weeks. A cough caused by bronchitis may last up to 3 weeks. Smoking, allergies, and asthma can make the condition worse. Repeated episodes of bronchitis may cause further lung problems, such as chronic obstructive pulmonary disease (COPD). What are the causes? This condition can be caused by germs and by substances that irritate the lungs, including:  Cold  and flu viruses. This condition is most often caused by the same virus that causes a cold.  Bacteria.  Exposure to tobacco smoke, dust, fumes, and air pollution.  What increases the risk? This condition is more likely to develop in people who:  Have close contact with someone with acute bronchitis.  Are exposed to lung irritants, such as tobacco smoke, dust, fumes, and vapors.  Have a weak immune system.  Have a respiratory condition such as asthma.  What are the signs or symptoms? Symptoms of this condition include:  A cough.  Coughing up clear, yellow, or green mucus.  Wheezing.  Chest congestion.  Shortness of breath.  A  fever.  Body aches.  Chills.  A sore throat.  How is this diagnosed? This condition is usually diagnosed with a physical exam. During the exam, your health care provider may order tests, such as chest X-rays, to rule out other conditions. He or she may also:  Test a sample of your mucus for bacterial infection.  Check the level of oxygen in your blood. This is done to check for pneumonia.  Do a chest X-ray or lung function testing to rule out pneumonia and other conditions.  Perform blood tests.  Your health care provider will also ask about your symptoms and medical history. How is this treated? Most cases of acute bronchitis clear up over time without treatment. Your health care provider may recommend:  Drinking more fluids. Drinking more makes your mucus thinner, which may make it easier to breathe.  Taking a medicine for a fever or cough.  Taking an antibiotic medicine.  Using an inhaler to help improve shortness of breath and to control a cough.  Using a cool mist vaporizer or humidifier to make it easier to breathe.  Follow these instructions at home: Medicines  Take over-the-counter and prescription medicines only as told by your health care provider.  If you were prescribed an antibiotic, take it as told by your health  care provider. Do not stop taking the antibiotic even if you start to feel better. General instructions  Get plenty of rest.  Drink enough fluids to keep your urine clear or pale yellow.  Avoid smoking and secondhand smoke. Exposure to cigarette smoke or irritating chemicals will make bronchitis worse. If you smoke and you need help quitting, ask your health care provider. Quitting smoking will help your lungs heal faster.  Use an inhaler, cool mist vaporizer, or humidifier as told by your health care provider.  Keep all follow-up visits as told by your health care provider. This is important. How is this prevented? To lower your risk of getting this condition again:  Wash your hands often with soap and water. If soap and water are not available, use hand sanitizer.  Avoid contact with people who have cold symptoms.  Try not to touch your hands to your mouth, nose, or eyes.  Make sure to get the flu shot every year.  Contact a health care provider if:  Your symptoms do not improve in 2 weeks of treatment. Get help right away if:  You cough up blood.  You have chest pain.  You have severe shortness of breath.  You become dehydrated.  You faint or keep feeling like you are going to faint.  You keep vomiting.  You have a severe headache.  Your fever or chills gets worse. This information is not intended to replace advice given to you by your health care provider. Make sure you discuss any questions you have with your health care provider. Document Released: 09/19/2004 Document Revised: 03/06/2016 Document Reviewed: 01/31/2016 Elsevier Interactive Patient Education  [employer]2018 [employer]

## 2017-09-11 ENCOUNTER — Encounter: Payer: Self-pay | Admitting: Registered Nurse

## 2017-09-11 ENCOUNTER — Ambulatory Visit: Payer: Self-pay | Admitting: Registered Nurse

## 2017-09-11 VITALS — HR 98 | Resp 18

## 2017-09-11 DIAGNOSIS — J209 Acute bronchitis, unspecified: Secondary | ICD-10-CM

## 2017-09-11 MED ORDER — AZITHROMYCIN 250 MG PO TABS: ORAL_TABLET | ORAL | 0 refills | Status: DC

## 2017-09-11 NOTE — Progress Notes (Signed)
Subjective:    Patient ID: Rebecca Sweeney, female    DOB: 24-Aug-1961, 57 y.o.   MRN: [id number]  56y/o caucasian female established patient seen on 09/09/2017 started on prednisone taper oral and tessalon pearles for bronchitis/acute rhinosinusitis returned today as still coughing up a lot of junk feeling tired.  She does think the tessalon pearles help to decrease frequency of cough when she uses them.  Feeling cold.  Area temperatures have decreased with cold front moving into area and we had hard frost this week also.  Still smoking cigarettes daily.      Review of Systems  Constitutional: Positive for fatigue. Negative for activity change, appetite change, chills, diaphoresis and fever.  HENT: Positive for congestion, postnasal drip and rhinorrhea. Negative for drooling, ear discharge, ear pain, facial swelling, hearing loss, mouth sores, nosebleeds, sinus pressure, sinus pain, sneezing, sore throat and tinnitus.   Eyes: Negative for photophobia and visual disturbance.  Respiratory: Positive for cough. Negative for choking, chest tightness, shortness of breath, wheezing and stridor.   Cardiovascular: Negative for chest pain, palpitations and leg swelling.  Gastrointestinal: Negative for abdominal distention, abdominal pain, blood in stool, constipation, diarrhea, nausea and vomiting.  Endocrine: Negative for cold intolerance and heat intolerance.  Musculoskeletal: Negative for arthralgias, back pain, gait problem, joint swelling, myalgias, neck pain and neck stiffness.  Skin: Negative for color change, pallor, rash and wound.  Allergic/Immunologic: Positive for environmental allergies. Negative for food allergies.  Neurological: Negative for dizziness, tremors, seizures, syncope, facial asymmetry, speech difficulty, weakness, light-headedness, numbness and headaches.  Hematological: Negative for adenopathy. Does not bruise/bleed easily.  Psychiatric/Behavioral: Positive for sleep  disturbance. Negative for agitation and confusion.       Objective:   Physical Exam  Constitutional: She is oriented to person, place, and time. Vital signs are normal. She appears well-developed and well-nourished. She is active and cooperative.  Non-toxic appearance. She does not have a sickly appearance. She appears ill. No distress.  HENT:  Head: Normocephalic and atraumatic.  Right Ear: Hearing, external ear and ear canal normal. A middle ear effusion is present.  Left Ear: Hearing, external ear and ear canal normal. A middle ear effusion is present.  Nose: Mucosal edema and rhinorrhea present. No nose lacerations, sinus tenderness, nasal deformity, septal deviation or nasal septal hematoma. No epistaxis.  No foreign bodies. Right sinus exhibits no maxillary sinus tenderness and no frontal sinus tenderness. Left sinus exhibits no maxillary sinus tenderness and no frontal sinus tenderness.  Mouth/Throat: Uvula is midline and mucous membranes are normal. Mucous membranes are not pale, not dry and not cyanotic. She has dentures. No oral lesions. No trismus in the jaw. Normal dentition. No dental abscesses, uvula swelling, lacerations or dental caries. Posterior oropharyngeal edema and posterior oropharyngeal erythema present. No oropharyngeal exudate or tonsillar abscesses.  Cobblestoning posterior pharynx; bilateral TMs air fluid level clear; bilateral allergic shiners; bilateral nasal turbinates clear discharge edema erythema  Eyes: Conjunctivae, EOM and lids are normal. Pupils are equal, round, and reactive to light. Right eye exhibits no chemosis, no discharge, no exudate and no hordeolum. No foreign body present in the right eye. Left eye exhibits no chemosis, no discharge, no exudate and no hordeolum. No foreign body present in the left eye. Right conjunctiva is not injected. Right conjunctiva has no hemorrhage. Left conjunctiva is not injected. Left conjunctiva has no hemorrhage. No scleral  icterus. Right eye exhibits normal extraocular motion and no nystagmus. Left eye exhibits normal extraocular motion and no  nystagmus. Right pupil is round and reactive. Left pupil is round and reactive. Pupils are equal.  Neck: Trachea normal, normal range of motion and phonation normal. Neck supple. No tracheal tenderness and no muscular tenderness present. No neck rigidity. No tracheal deviation, no edema, no erythema and normal range of motion present. No thyroid mass and no thyromegaly present.  Cardiovascular: Normal rate, regular rhythm, S1 normal, S2 normal, normal heart sounds and intact distal pulses. PMI is not displaced. Exam reveals no gallop and no friction rub.  No murmur heard. Pulmonary/Chest: Effort normal and breath sounds normal. No accessory muscle usage or stridor. No respiratory distress. She has no decreased breath sounds. She has no wheezes. She has no rhonchi. She has no rales. She exhibits no tenderness.  Frequent harsh cough nonproductive in exam room; spoke full sentences without difficulty  Abdominal: Soft. Normal appearance. She exhibits no distension, no fluid wave and no ascites. There is no rigidity and no guarding.  Musculoskeletal: Normal range of motion. She exhibits no edema or tenderness.       Right shoulder: Normal.       Left shoulder: Normal.       Right elbow: Normal.      Left elbow: Normal.       Right hip: Normal.       Left hip: Normal.       Right knee: Normal.       Left knee: Normal.       Cervical back: Normal.       Thoracic back: Normal.       Lumbar back: Normal.       Right hand: Normal.       Left hand: Normal.  Lymphadenopathy:       Head (right side): No submental, no submandibular, no tonsillar, no preauricular, no posterior auricular and no occipital adenopathy present.       Head (left side): No submental, no submandibular, no tonsillar, no preauricular, no posterior auricular and no occipital adenopathy present.    She has no  cervical adenopathy.       Right cervical: No superficial cervical, no deep cervical and no posterior cervical adenopathy present.      Left cervical: No superficial cervical, no deep cervical and no posterior cervical adenopathy present.  Neurological: She is alert and oriented to person, place, and time. She has normal strength. She is not disoriented. She displays no atrophy and no tremor. No cranial nerve deficit or sensory deficit. She exhibits normal muscle tone. She displays no seizure activity. Coordination and gait normal. GCS eye subscore is 4. GCS verbal subscore is 5. GCS motor subscore is 6.  On/off exam table without difficulty; gait sure and steady in hallway  Skin: Skin is warm, dry and intact. No abrasion, no bruising, no burn, no ecchymosis, no laceration, no lesion, no petechiae and no rash noted. She is not diaphoretic. No cyanosis or erythema. No pallor. Nails show no clubbing.  Psychiatric: She has a normal mood and affect. Her speech is normal and behavior is normal. Judgment and thought content normal. Cognition and memory are normal.  Nursing note and vitals reviewed.         Assessment & Plan:  A-acute bronchitis and rhinosinusitis  Stop smoking.  Hydrate, rest when at home, eat regular meals, honey with lemon to soothe throat.  Rx Azithromycin 500mg  po today than 250mg  po daily days 2-5 #6 RF0 dispensed from [facility].  Continue prednisone, flonase, nasal saline and  antihistamine.  Hydrate.  Cough lozenges po q2h prn cough given 8 UD from clinic stock. Continue Prednisone taper 10mg  (60/50/40/30/20/10mg ) po daily with breakfast previously dispensed from [facility].  Discussed possible side effects increased/decreased appetite, difficulty sleeping, increased blood sugar, increased blood pressure and heart rate. Bronchitis simple, community acquired, may have started as viral (probably respiratory syncytial, parainfluenza, influenza, or adenovirus), but now evidence of acute purulent  bronchitis with resultant bronchial edema and mucus formation.  Viruses are the most common cause of bronchial inflammation in otherwise healthy adults with acute bronchitis.  The appearance of sputum is not predictive of whether a bacterial infection is present.  Purulent sputum is most often caused by viral infections.  There are a small portion of those caused by non-viral agents being Mycoplama pneumonia.  Microscopic examination or C&S of sputum in the healthy adult with acute bronchitis is generally not helpful (usually negative or normal respiratory flora) other considerations being cough from upper respiratory tract infections, sinusitis or allergic syndromes (mild asthma or viral pneumonia).  Differential Diagnoses:  reactive airway disease (asthma, allergic aspergillosis (eosinophilia), chronic bronchitis, respiratory infection (sinusitis, common cold, pneumonia), congestive heart failure, reflux esophagitis, bronchogenic tumor, aspiration syndromes and/or exposure to pulmonary irritants/smoke. Without high fever, severe dyspnea, lack of physical findings or other risk factors, I will hold on a chest radiograph and CBC at this time.  I discussed that approximately 50% of patients with acute bronchitis have a cough that lasts up to three weeks, and 25% for over a month.  Tylenol 500mg  one to two tablets every four to six hours as needed for fever or myalgias.  No aspirin. Exitcare handout on bronchitis given to patient.  ER if hemopthysis, SOB, worst chest pain of life.   Patient instructed to follow up in one week or sooner if symptoms worsen.  Patient verbalized agreement and understanding of treatment plan.  P2:  hand washing and cover cough  Continue flonase 1 spray each nostril BID  previously dispensed, saline 2 sprays each nostril q2h wa prn congestion previously dispensed.  Has not had improvement with 48 hours of saline and flonase plus prednisone taper dispensed azithromycin Rx from [facility] to  start today at lunch.  500mg  po today than 250 mg po daily days 2-5 #6 RF0 notify me and will consider antibiotic.  Denied personal or family history of ENT cancer.  Shower BID especially prior to bed. No evidence of systemic bacterial infection, non toxic and well hydrated.  I do not see where any further testing or imaging is necessary at this time.  Consider restarting singulair 10mg  po qhs and claritin/zyrtec 10mg  po daily also.   I will suggest supportive care, rest, good hygiene and encourage the patient to take adequate fluids.  The patient is to return to clinic or EMERGENCY ROOM if symptoms worsen or change significantly.  Exitcare handout on sinusitis and sinus rinse given to patient.  Patient verbalized agreement and understanding of treatment plan and had no further questions at this time.   P2:  Hand washing and cover cough

## 2017-09-11 NOTE — Patient Instructions (Signed)
Smoking Tobacco Information Smoking tobacco will very likely harm your health. Tobacco contains a poisonous (toxic), colorless chemical called nicotine. Nicotine affects the brain and makes tobacco addictive. This change in your brain can make it hard to stop smoking. Tobacco also has other toxic chemicals that can hurt your body and raise your risk of many cancers. How can smoking tobacco affect me? Smoking tobacco can increase your chances of having serious health conditions, such as:  Cancer. Smoking is most commonly associated with lung cancer, but can lead to cancer in other parts of the body.  Chronic obstructive pulmonary disease (COPD). This is a long-term lung condition that makes it hard to breathe. It also gets worse over time.  High blood pressure (hypertension), heart disease, stroke, or heart attack.  Lung infections, such as pneumonia.  Cataracts. This is when the lenses in the eyes become clouded.  Digestive problems. This may include peptic ulcers, heartburn, and gastroesophageal reflux disease (GERD).  Oral health problems, such as gum disease and tooth loss.  Loss of taste and smell.  Smoking can affect your appearance by causing:  Wrinkles.  Yellow or stained teeth, fingers, and fingernails.  Smoking tobacco can also affect your social life.  Many workplaces, restaurants, hotels, and public places are tobacco-free. This means that you may experience challenges in finding places to smoke when away from home.  The cost of a smoking habit can be expensive. Expenses for someone who smokes come in two ways: ? You spend money on a regular basis to buy tobacco. ? Your health care costs in the long-term are higher if you smoke.  Tobacco smoke can also affect the health of those around you. Children of smokers have greater chances of: ? Sudden infant death syndrome (SIDS). ? Ear infections. ? Lung infections.  What lifestyle changes can be made?  Do not start  smoking. Quit if you already do.  To quit smoking: ? Make a plan to quit smoking and commit yourself to it. Look for programs to help you and ask your health care provider for recommendations and ideas. ? Talk with your health care provider about using nicotine replacement medicines to help you quit. Medicine replacement medicines include gum, lozenges, patches, sprays, or pills. ? Do not replace cigarette smoking with electronic cigarettes, which are commonly called e-cigarettes. The safety of e-cigarettes is not known, and some may contain harmful chemicals. ? Avoid places, people, or situations that tempt you to smoke. ? If you try to quit but return to smoking, don't give up hope. It is very common for people to try a number of times before they fully succeed. When you feel ready again, give it another try.  Quitting smoking might affect the way you eat as well as your weight. Be prepared to monitor your eating habits. Get support in planning and following a healthy diet.  Ask your health care provider about having regular tests (screenings) to check for cancer. This may include blood tests, imaging tests, and other tests.  Exercise regularly. Consider taking walks, joining a gym, or doing yoga or exercise classes.  Develop skills to manage your stress. These skills include meditation. What are the benefits of quitting smoking? By quitting smoking, you may:  Lower your risk of getting cancer and other diseases caused by smoking.  Live longer.  Breathe better.  Lower your blood pressure and heart rate.  Stop your addiction to tobacco.  Stop creating secondhand smoke that hurts other people.  Improve your   sense of taste and smell.  Look better over time, due to having fewer wrinkles and less staining.  What can happen if changes are not made? If you do not stop smoking, you may:  Get cancer and other diseases.  Develop COPD or other long-term (chronic) lung  conditions.  Develop serious problems with your heart and blood vessels (cardiovascular system).  Need more tests to screen for problems caused by smoking.  Have higher, long-term healthcare costs from medicines or treatments related to smoking.  Continue to have worsening changes in your lungs, mouth, and nose.  Where to find support: To get support to quit smoking, consider:  Asking your health care provider for more information and resources.  Taking classes to learn more about quitting smoking.  Looking for local organizations that offer resources about quitting smoking.  Joining a support group for people who want to quit smoking in your local community.  Where to find more information: You may find more information about quitting smoking from:  HelpGuide.org: www.helpguide.org/articles/addictions/how-to-quit-smoking.htm  BankRights.uy: smokefree.gov  American Lung Association: www.lung.org  Contact a health care provider if:  You have problems breathing.  Your lips, nose, or fingers turn blue.  You have chest pain.  You are coughing up blood.  You feel faint or you pass out.  You have other noticeable changes that cause you to worry. Summary  Smoking tobacco can negatively affect your health, the health of those around you, your finances, and your social life.  Do not start smoking. Quit if you already do. If you need help quitting, ask your health care provider.  Think about joining a support group for people who want to quit smoking in your local community. There are many effective programs that will help you to quit this behavior. This information is not intended to replace advice given to you by your health care provider. Make sure you discuss any questions you have with your health care provider. Document Released: 08/27/2016 Document Revised: 08/27/2016 Document Reviewed: 08/27/2016 Elsevier Interactive Patient Education  2018 [employer]. Acute  Bronchitis, Adult Acute bronchitis is sudden (acute) swelling of the air tubes (bronchi) in the lungs. Acute bronchitis causes these tubes to fill with mucus, which can make it hard to breathe. It can also cause coughing or wheezing. In adults, acute bronchitis usually goes away within 2 weeks. A cough caused by bronchitis may last up to 3 weeks. Smoking, allergies, and asthma can make the condition worse. Repeated episodes of bronchitis may cause further lung problems, such as chronic obstructive pulmonary disease (COPD). What are the causes? This condition can be caused by germs and by substances that irritate the lungs, including:  Cold and flu viruses. This condition is most often caused by the same virus that causes a cold.  Bacteria.  Exposure to tobacco smoke, dust, fumes, and air pollution.  What increases the risk? This condition is more likely to develop in people who:  Have close contact with someone with acute bronchitis.  Are exposed to lung irritants, such as tobacco smoke, dust, fumes, and vapors.  Have a weak immune system.  Have a respiratory condition such as asthma.  What are the signs or symptoms? Symptoms of this condition include:  A cough.  Coughing up clear, yellow, or green mucus.  Wheezing.  Chest congestion.  Shortness of breath.  A fever.  Body aches.  Chills.  A sore throat.  How is this diagnosed? This condition is usually diagnosed with a physical exam. During  the exam, your health care provider may order tests, such as chest X-rays, to rule out other conditions. He or she may also:  Test a sample of your mucus for bacterial infection.  Check the level of oxygen in your blood. This is done to check for pneumonia.  Do a chest X-ray or lung function testing to rule out pneumonia and other conditions.  Perform blood tests.  Your health care provider will also ask about your symptoms and medical history. How is this treated? Most  cases of acute bronchitis clear up over time without treatment. Your health care provider may recommend:  Drinking more fluids. Drinking more makes your mucus thinner, which may make it easier to breathe.  Taking a medicine for a fever or cough.  Taking an antibiotic medicine.  Using an inhaler to help improve shortness of breath and to control a cough.  Using a cool mist vaporizer or humidifier to make it easier to breathe.  Follow these instructions at home: Medicines  Take over-the-counter and prescription medicines only as told by your health care provider.  If you were prescribed an antibiotic, take it as told by your health care provider. Do not stop taking the antibiotic even if you start to feel better. General instructions  Get plenty of rest.  Drink enough fluids to keep your urine clear or pale yellow.  Avoid smoking and secondhand smoke. Exposure to cigarette smoke or irritating chemicals will make bronchitis worse. If you smoke and you need help quitting, ask your health care provider. Quitting smoking will help your lungs heal faster.  Use an inhaler, cool mist vaporizer, or humidifier as told by your health care provider.  Keep all follow-up visits as told by your health care provider. This is important. How is this prevented? To lower your risk of getting this condition again:  Wash your hands often with soap and water. If soap and water are not available, use hand sanitizer.  Avoid contact with people who have cold symptoms.  Try not to touch your hands to your mouth, nose, or eyes.  Make sure to get the flu shot every year.  Contact a health care provider if:  Your symptoms do not improve in 2 weeks of treatment. Get help right away if:  You cough up blood.  You have chest pain.  You have severe shortness of breath.  You become dehydrated.  You faint or keep feeling like you are going to faint.  You keep vomiting.  You have a severe  headache.  Your fever or chills gets worse. This information is not intended to replace advice given to you by your health care provider. Make sure you discuss any questions you have with your health care provider. Document Released: 09/19/2004 Document Revised: 03/06/2016 Document Reviewed: 01/31/2016 Elsevier Interactive Patient Education  [employer]2018 Else[employer] Inc.

## 2017-10-07 IMAGING — NM NM MISC PROCEDURE
6 series · 36 of 36 positions shown · non-contrast
Comparison: none

[Series 1: stress-sum-em · 6.40mm/px · 6 of 64 frames shown]
[frame 6/64]
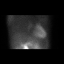
[frame 16/64]
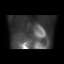
[frame 27/64]
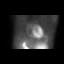
[frame 38/64]
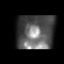
[frame 48/64]
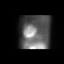
[frame 59/64]
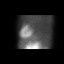

[Series 1: wbr_s-proj_st stress-sum-em · 6.40mm/px · 6 of 64 frames shown]
[frame 6/64]
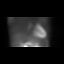
[frame 16/64]
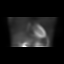
[frame 27/64]
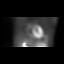
[frame 38/64]
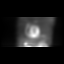
[frame 48/64]
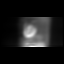
[frame 59/64]
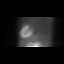

[Series 1: wbr_r-proj_st rest · 6.40mm/px · 6 of 64 frames shown]
[frame 6/64]
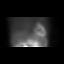
[frame 16/64]
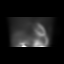
[frame 27/64]
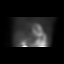
[frame 38/64]
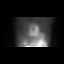
[frame 48/64]
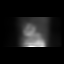
[frame 59/64]
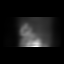

[Series 1: stress-gsp · 6.40mm/px · 6 of 512 frames shown]
[frame 43/512  full-range]
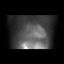
[frame 128/512  full-range]
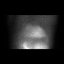
[frame 214/512  full-range]
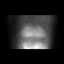
[frame 299/512  full-range]
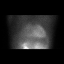
[frame 384/512  full-range]
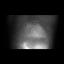
[frame 470/512  full-range]
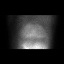

[Series 1: wbr_s-proj_st stress-gsp · 6.40mm/px · 6 of 512 frames shown]
[frame 43/512]
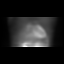
[frame 128/512]
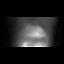
[frame 214/512]
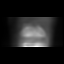
[frame 299/512]
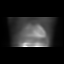
[frame 384/512]
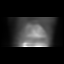
[frame 470/512]
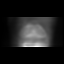

[Series 1: rest · 6.40mm/px · 6 of 64 frames shown]
[frame 6/64]
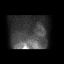
[frame 16/64]
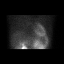
[frame 27/64]
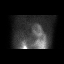
[frame 38/64]
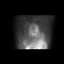
[frame 48/64]
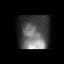
[frame 59/64]
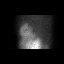

[36 of 36 positions shown; findings below may reference images not displayed]

Canned report from images found in remote index.

Refer to host system for actual result text.

## 2017-11-06 ENCOUNTER — Encounter: Payer: Self-pay | Admitting: Registered Nurse

## 2017-11-06 ENCOUNTER — Ambulatory Visit: Payer: Self-pay | Admitting: Registered Nurse

## 2017-11-06 VITALS — BP 98/70 | HR 75 | Temp 98.8°F

## 2017-11-06 DIAGNOSIS — J209 Acute bronchitis, unspecified: Secondary | ICD-10-CM

## 2017-11-06 DIAGNOSIS — H6993 Unspecified Eustachian tube disorder, bilateral: Secondary | ICD-10-CM

## 2017-11-06 DIAGNOSIS — H6983 Other specified disorders of Eustachian tube, bilateral: Secondary | ICD-10-CM

## 2017-11-06 DIAGNOSIS — M62838 Other muscle spasm: Secondary | ICD-10-CM

## 2017-11-06 MED ORDER — IBUPROFEN 800 MG PO TABS: 800.0000 mg | ORAL_TABLET | Freq: Three times a day (TID) | ORAL | 0 refills | Status: AC | PRN

## 2017-11-06 MED ORDER — CYCLOBENZAPRINE HCL 10 MG PO TABS: 5.0000 mg | ORAL_TABLET | Freq: Three times a day (TID) | ORAL | 0 refills | Status: AC | PRN

## 2017-11-06 MED ORDER — SALINE SPRAY 0.65 % NA SOLN: 2.0000 | NASAL | 0 refills | Status: DC

## 2017-11-06 MED ORDER — LORATADINE 10 MG PO TABS: 10.0000 mg | ORAL_TABLET | Freq: Every day | ORAL | 11 refills | Status: DC

## 2017-11-06 NOTE — Progress Notes (Signed)
Subjective:    Patient ID: Rebecca Sweeney, female    DOB: Jan 05, 1961, 57 y.o.   MRN: [id number]  56y/o caucasian female established Pt c/o rhinorrhea, pnd, sneezing x1 week.  Intermittent upper left teeth pain. Denies fever, cough, sinus pressure. No OTCs at home for sx. Also c/o neck and upper back pain x3 days. No known injury. Cared for granddaughters last Thursday and they were sick with fever and cough now she is sick; + sick coworkers also Not taking antihistamine but is using saline and flonase without relief of symptoms Neck pain would like refill motrin and flexeril  Has used both in the past with good relief  Denied loss of bowel/bladder control, saddle paresthesias, or arm/leg weakness.      Review of Systems  Constitutional: Positive for fatigue. Negative for activity change, appetite change, chills, diaphoresis, fever and unexpected weight change.  HENT: Positive for congestion, ear pain, postnasal drip, rhinorrhea and sneezing. Negative for dental problem, drooling, ear discharge, facial swelling, hearing loss, mouth sores, nosebleeds, sinus pressure, sinus pain, sore throat, tinnitus, trouble swallowing and voice change.   Eyes: Negative for photophobia, pain, discharge, redness, itching and visual disturbance.  Respiratory: Positive for cough. Negative for choking, chest tightness, shortness of breath, wheezing and stridor.   Cardiovascular: Negative for chest pain, palpitations and leg swelling.  Gastrointestinal: Negative for abdominal distention, abdominal pain, blood in stool, constipation, diarrhea, nausea and vomiting.  Endocrine: Negative for cold intolerance and heat intolerance.  Genitourinary: Negative for difficulty urinating, dysuria and hematuria.  Musculoskeletal: Positive for myalgias and neck pain. Negative for arthralgias, back pain, gait problem, joint swelling and neck stiffness.  Skin: Negative for color change, pallor, rash and wound.  Allergic/Immunologic:  Positive for environmental allergies. Negative for food allergies.  Neurological: Negative for dizziness, tremors, seizures, syncope, facial asymmetry, speech difficulty, weakness, light-headedness, numbness and headaches.  Hematological: Negative for adenopathy. Does not bruise/bleed easily.  Psychiatric/Behavioral: Negative for agitation, behavioral problems, confusion and sleep disturbance.       Objective:   Physical Exam  Constitutional: She is oriented to person, place, and time. She appears well-developed and well-nourished. She is active and cooperative.  Non-toxic appearance. She does not have a sickly appearance. She appears ill. No distress.  HENT:  Head: Normocephalic and atraumatic.  Right Ear: Hearing, external ear and ear canal normal. A middle ear effusion is present.  Left Ear: Hearing, external ear and ear canal normal. A middle ear effusion is present.  Nose: Mucosal edema and rhinorrhea present. No nose lacerations, sinus tenderness, nasal deformity, septal deviation or nasal septal hematoma. No epistaxis.  No foreign bodies. Right sinus exhibits maxillary sinus tenderness. Right sinus exhibits no frontal sinus tenderness. Left sinus exhibits maxillary sinus tenderness. Left sinus exhibits no frontal sinus tenderness.  Mouth/Throat: Uvula is midline and mucous membranes are normal. Mucous membranes are not pale, not dry and not cyanotic. She has dentures. No oral lesions. No trismus in the jaw. Normal dentition. No dental abscesses, uvula swelling, lacerations or dental caries. Posterior oropharyngeal edema and posterior oropharyngeal erythema present. No oropharyngeal exudate or tonsillar abscesses.  Cobblestoning posterior pharynx; bilateral allergic shiners and lower eyelid swelling, bilateral nasal turbinates with clear discharge/edema/erythema; bilateral TMs air fluid level clear  Eyes: Conjunctivae, EOM and lids are normal. Pupils are equal, round, and reactive to light.  Right eye exhibits no chemosis, no discharge, no exudate and no hordeolum. No foreign body present in the right eye. Left eye exhibits no chemosis, no  discharge, no exudate and no hordeolum. No foreign body present in the left eye. Right conjunctiva is not injected. Right conjunctiva has no hemorrhage. Left conjunctiva is not injected. Left conjunctiva has no hemorrhage. No scleral icterus. Right eye exhibits normal extraocular motion and no nystagmus. Left eye exhibits normal extraocular motion and no nystagmus. Right pupil is round and reactive. Left pupil is round and reactive. Pupils are equal.  Neck: Trachea normal, normal range of motion and phonation normal. Neck supple. Muscular tenderness present. No tracheal tenderness and no spinous process tenderness present. No neck rigidity. No tracheal deviation, no edema, no erythema and normal range of motion present. No thyroid mass and no thyromegaly present.    Bilateral parapsinals TTP C3-7; bilateral trapezius tight/spasm; full AROM with discomfort cervical; rotation and extension worsens pain  Cardiovascular: Normal rate, regular rhythm, S1 normal, S2 normal, normal heart sounds and intact distal pulses. PMI is not displaced. Exam reveals no gallop, no distant heart sounds and no friction rub.  No murmur heard. Pulmonary/Chest: Effort normal and breath sounds normal. No accessory muscle usage or stridor. No respiratory distress. She has no decreased breath sounds. She has no wheezes. She has no rhonchi. She has no rales. She exhibits no tenderness.  Frequent nonproductive cough in exam room; spoke full sentences without difficulty  Abdominal: Soft. Normal appearance. She exhibits no distension, no fluid wave and no ascites. There is no rigidity and no guarding.  Musculoskeletal: She exhibits no edema.       Right shoulder: Normal.       Left shoulder: Normal.       Right elbow: Normal.      Left elbow: Normal.       Right hip: Normal.        Left hip: Normal.       Right knee: Normal.       Left knee: Normal.       Cervical back: She exhibits decreased range of motion, tenderness, pain and spasm. She exhibits no bony tenderness, no swelling, no edema, no deformity, no laceration and normal pulse.       Thoracic back: Normal.       Lumbar back: Normal.       Right hand: Normal.       Left hand: Normal.  Lymphadenopathy:       Head (right side): No submental, no submandibular, no tonsillar, no preauricular, no posterior auricular and no occipital adenopathy present.       Head (left side): No submental, no submandibular, no tonsillar, no preauricular, no posterior auricular and no occipital adenopathy present.    She has no cervical adenopathy.       Right cervical: No superficial cervical, no deep cervical and no posterior cervical adenopathy present.      Left cervical: No superficial cervical, no deep cervical and no posterior cervical adenopathy present.  Neurological: She is alert and oriented to person, place, and time. She has normal strength. She is not disoriented. She displays no atrophy and no tremor. No cranial nerve deficit or sensory deficit. She exhibits normal muscle tone. She displays no seizure activity. Coordination and gait normal. GCS eye subscore is 4. GCS verbal subscore is 5. GCS motor subscore is 6.  On/off exam table; in/outo f chair without difficulty; gait sure and steady hallway; bilateral hand grasp equal 5/5  Skin: Skin is warm, dry and intact. No abrasion, no bruising, no burn, no ecchymosis, no laceration, no lesion, no petechiae and no  rash noted. She is not diaphoretic. No cyanosis or erythema. No pallor. Nails show no clubbing.  Psychiatric: She has a normal mood and affect. Her speech is normal and behavior is normal. Judgment and thought content normal. Cognition and memory are normal.  Nursing note and vitals reviewed.     Applied thermacare cervical patch in clinic from clinic stock     Assessment & Plan:  A-muscle spasms neck acute, acute bronchitis, eustachian tube dysfunction  P-cyclobenazeprine/flexeril 5-10mg  po TID prn muscle spasms #30 RF0 dispensed from [facility]. Discussed may cause drowsiness avoid alcohol and driving after taking flexeril. Ibuprofen 800mg  po TID prn pain #30 RF0 dispensed from [facility]. biofreeze topical qid prn pain 4 UD given to patient from clinic stock  Avoid alcohol intake and driving while taking cyclobenazeprine/flexeril as drowsiness common side effect.  Slow position changes as medication also lower blood pressure.  Home stretches demonstrated to patient-e.g. Arm circles, walking up wall, chest stretches, neck AROM, chin tucks, knee to chest and rock side to side on back. Self massage or professional prn, foam roller use or tennis/racquetball.  Heat/cryotherapy 15 minutes QID prn.  Trial [facility] 1 applied and patient may return tomorrow for another from [employer] [facility].  Consider physical therapy referral if no improvement [facility] and/or chiropractic care.  Ensure ergonomics correct desk at work avoid repetitive motions if possible/holding phone/laptop in hand use desk/stand and/or break up lifting items into smaller loads/weights.  Patient was instructed to rest, ice, and ROM exercises.  Activity as tolerated.   Follow up if symptoms persist or worsen especially if loss of bowel/bladder control, arm/leg weakness and/or saddle paresthesias.  Exitcare handout on muscle spasms and neck strain rehab exercises given to patient.  Patient verbalized agreement and understanding of treatment plan and had no further questions at this time.  P2:  Injury Prevention and Fitness.  Supportive treatment.   No evidence of invasive bacterial infection, non toxic and well hydrated.  This is most likely self limiting viral infection.  I do not see where any further testing or imaging is necessary at this time.   I will suggest supportive care, rest, good hygiene  and encourage the patient to take adequate fluids.  The patient is to return to clinic or EMERGENCY ROOM if symptoms worsen or change significantly e.g. ear pain, fever, purulent discharge from ears or bleeding.  Exitcare handout on eustachian tube dysfunction given to patient.  Patient verbalized agreement and understanding of treatment plan.    Smoker with seasonal allergies and probable viral URI.  Instructed to continue spring allergan regimen flonase, saline, antihistamine daily.  Refused singulair refill/restart.  If no improvement with medications daily x 48 hours then start Rx Azithromycin 500mg  po day 1 then 250mg  po days 2-5 RF0 dispensed from [facility].  Cough lozenges po q2h prn cough  Bronchitis simple, community acquired, may have started as viral (probably respiratory syncytial, parainfluenza, influenza, or adenovirus), but now evidence of acute purulent bronchitis with resultant bronchial edema and mucus formation.  Viruses are the most common cause of bronchial inflammation in otherwise healthy adults with acute bronchitis.  The appearance of sputum is not predictive of whether a bacterial infection is present.  Purulent sputum is most often caused by viral infections.  There are a small portion of those caused by non-viral agents being Mycoplama pneumonia.  Microscopic examination or C&S of sputum in the healthy adult with acute bronchitis is generally not helpful (usually negative or normal respiratory flora)  other considerations being cough from upper respiratory tract infections, sinusitis or allergic syndromes (mild asthma or viral pneumonia).  Differential Diagnoses:  reactive airway disease (asthma, allergic aspergillosis (eosinophilia), chronic bronchitis, respiratory infection (sinusitis, common cold, pneumonia), congestive heart failure, reflux esophagitis, bronchogenic tumor, aspiration syndromes and/or exposure to pulmonary irritants/smoke.   Without high fever, severe dyspnea, lack of  physical findings or other risk factors, I will hold on a chest radiograph and CBC at this time.  I discussed that approximately 50% of patients with acute bronchitis have a cough that lasts up to three weeks, and 25% for over a month.  Tylenol 500mg  one to two tablets every four to six hours as needed for fever or myalgias.  No aspirin. Exitcare handout on bronchitis  given to patient.  ER if hemopthysis, SOB, worst chest pain of life.   Patient instructed to follow up in one week or sooner if symptoms worsen.  Patient verbalized agreement and understanding of treatment plan.  P2:  hand washing and cover cough

## 2017-11-06 NOTE — Patient Instructions (Addendum)
Viral Respiratory Infection A respiratory infection is an illness that affects part of the respiratory system, such as the lungs, nose, or throat. Most respiratory infections are caused by either viruses or bacteria. A respiratory infection that is caused by a virus is called a viral respiratory infection. Common types of viral respiratory infections include:  A cold.  The flu (influenza).  A respiratory syncytial virus (RSV) infection.  How do I know if I have a viral respiratory infection? Most viral respiratory infections cause:  A stuffy or runny nose.  Yellow or green nasal discharge.  A cough.  Sneezing.  Fatigue.  Achy muscles.  A sore throat.  Sweating or chills.  A fever.  A headache.  How are viral respiratory infections treated? If influenza is diagnosed early, it may be treated with an antiviral medicine that shortens the length of time a person has symptoms. Symptoms of viral respiratory infections may be treated with over-the-counter and prescription medicines, such as:  Expectorants. These make it easier to cough up mucus.  Decongestant nasal sprays.  Health care providers do not prescribe antibiotic medicines for viral infections. This is because antibiotics are designed to kill bacteria. They have no effect on viruses. How do I know if I should stay home from work or school? To avoid exposing others to your respiratory infection, stay home if you have:  A fever.  A persistent cough.  A sore throat.  A runny nose.  Sneezing.  Muscles aches.  Headaches.  Fatigue.  Weakness.  Chills.  Sweating.  Nausea.  Follow these instructions at home:  Rest as much as possible.  Take over-the-counter and prescription medicines only as told by your health care provider.  Drink enough fluid to keep your urine clear or pale yellow. This helps prevent dehydration and helps loosen up mucus.  Gargle with a salt-water mixture 3-4 times per day or  as needed. To make a salt-water mixture, completely dissolve -1 tsp of salt in 1 cup of warm water.  Use nose drops made from salt water to ease congestion and soften raw skin around your nose.  Do not drink alcohol.  Do not use tobacco products, including cigarettes, chewing tobacco, and e-cigarettes. If you need help quitting, ask your health care provider. Contact a health care provider if:  Your symptoms last for 10 days or longer.  Your symptoms get worse over time.  You have a fever.  You have severe sinus pain in your face or forehead.  The glands in your jaw or neck become very swollen. Get help right away if:  You feel pain or pressure in your chest.  You have shortness of breath.  You faint or feel like you will faint.  You have severe and persistent vomiting.  You feel confused or disoriented. This information is not intended to replace advice given to you by your health care provider. Make sure you discuss any questions you have with your health care provider. Document Released: 05/22/2005 Document Revised: 01/18/2016 Document Reviewed: 01/18/2015 Elsevier Interactive Patient Education  2018 [employer]. Muscle Cramps and Spasms Muscle cramps and spasms occur when a muscle or muscles tighten and you have no control over this tightening (involuntary muscle contraction). They are a common problem and can develop in any muscle. The most common place is in the calf muscles of the leg. Muscle cramps and muscle spasms are both involuntary muscle contractions, but there are some differences between the two:  Muscle cramps are painful. They come  and go and may last a few seconds to 15 minutes. Muscle cramps are often more forceful and last longer than muscle spasms.  Muscle spasms may or may not be painful. They may also last just a few seconds or much longer.  Certain medical conditions, such as diabetes or Parkinson disease, can make it more likely to develop cramps or  spasms. However, cramps or spasms are usually not caused by a serious underlying problem. Common causes include:  Overexertion.  Overuse from repetitive motions, or doing the same thing over and over.  Remaining in a certain position for a long period of time.  Improper preparation, form, or technique while playing a sport or doing an activity.  Dehydration.  Injury.  Side effects of some medicines.  Abnormally low levels of the salts and ions in your blood (electrolytes), especially potassium and calcium. This could happen if you are taking water pills (diuretics) or if you are pregnant.  In many cases, the cause of muscle cramps or spasms is unknown. Follow these instructions at home:  Stay well hydrated. Drink enough fluid to keep your urine clear or pale yellow.  Try massaging, stretching, and relaxing the affected muscle.  If directed, apply heat to tight or tense muscles as often as told by your health care provider. Use the heat source that your health care provider recommends, such as a moist heat pack or a heating pad. ? Place a towel between your skin and the heat source. ? Leave the heat on for 20-30 minutes. ? Remove the heat if your skin turns bright red. This is especially important if you are unable to feel pain, heat, or cold. You may have a greater risk of getting burned.  If directed, put ice on the affected area. This may help if you are sore or have pain after a cramp or spasm. ? Put ice in a plastic bag. ? Place a towel between your skin and the bag. ? Leavethe ice on for 20 minutes, 2-3 times a day.  Take over-the-counter and prescription medicines only as told by your health care provider.  Pay attention to any changes in your symptoms. Contact a health care provider if:  Your cramps or spasms get more severe or happen more often.  Your cramps or spasms do not improve over time. This information is not intended to replace advice given to you by your  health care provider. Make sure you discuss any questions you have with your health care provider. Document Released: 02/01/2002 Document Revised: 09/13/2015 Document Reviewed: 05/16/2015 Elsevier Interactive Patient Education  2018 [employer] Cervical Strain and Sprain Rehab Ask your health care provider which exercises are safe for you. Do exercises exactly as told by your health care provider and adjust them as directed. It is normal to feel mild stretching, pulling, tightness, or discomfort as you do these exercises, but you should stop right away if you feel sudden pain or your pain gets worse.Do not begin these exercises until told by your health care provider. Stretching and range of motion exercises These exercises warm up your muscles and joints and improve the movement and flexibility of your neck. These exercises also help to relieve pain, numbness, and tingling. Exercise A: Cervical side bend  1. Using good posture, sit on a stable chair or stand up. 2. Without moving your shoulders, slowly tilt your left / right ear to your shoulder until you feel a stretch in your neck muscles. You should be looking straight  ahead. 3. Hold for __________ seconds. 4. Repeat with the other side of your neck. Repeat __________ times. Complete this exercise __________ times a day. Exercise B: Cervical rotation  1. Using good posture, sit on a stable chair or stand up. 2. Slowly turn your head to the side as if you are looking over your left / right shoulder. ? Keep your eyes level with the ground. ? Stop when you feel a stretch along the side and the back of your neck. 3. Hold for __________ seconds. 4. Repeat this by turning to your other side. Repeat __________ times. Complete this exercise __________ times a day. Exercise C: Thoracic extension and pectoral stretch 1. Roll a towel or a small blanket so it is about 4 inches (10 cm) in diameter. 2. Lie down on your back on a firm  surface. 3. Put the towel lengthwise, under your spine in the middle of your back. It should not be not under your shoulder blades. The towel should line up with your spine from your middle back to your lower back. 4. Put your hands behind your head and let your elbows fall out to your sides. 5. Hold for __________ seconds. Repeat __________ times. Complete this exercise __________ times a day. Strengthening exercises These exercises build strength and endurance in your neck. Endurance is the ability to use your muscles for a long time, even after your muscles get tired. Exercise D: Upper cervical flexion, isometric 1. Lie on your back with a thin pillow behind your head and a small rolled-up towel under your neck. 2. Gently tuck your chin toward your chest and nod your head down to look toward your feet. Do not lift your head off the pillow. 3. Hold for __________ seconds. 4. Release the tension slowly. Relax your neck muscles completely before you repeat this exercise. Repeat __________ times. Complete this exercise __________ times a day. Exercise E: Cervical extension, isometric  1. Stand about 6 inches (15 cm) away from a wall, with your back facing the wall. 2. Place a soft object, about 6-8 inches (15-20 cm) in diameter, between the back of your head and the wall. A soft object could be a small pillow, a ball, or a folded towel. 3. Gently tilt your head back and press into the soft object. Keep your jaw and forehead relaxed. 4. Hold for __________ seconds. 5. Release the tension slowly. Relax your neck muscles completely before you repeat this exercise. Repeat __________ times. Complete this exercise __________ times a day. Posture and body mechanics  Body mechanics refers to the movements and positions of your body while you do your daily activities. Posture is part of body mechanics. Good posture and healthy body mechanics can help to relieve stress in your body's tissues and joints.  Good posture means that your spine is in its natural S-curve position (your spine is neutral), your shoulders are pulled back slightly, and your head is not tipped forward. The following are general guidelines for applying improved posture and body mechanics to your everyday activities. Standing  When standing, keep your spine neutral and keep your feet about hip-width apart. Keep a slight bend in your knees. Your ears, shoulders, and hips should line up.  When you do a task in which you stand in one place for a long time, place one foot up on a stable object that is 2-4 inches (5-10 cm) high, such as a footstool. This helps keep your spine neutral. Sitting   When sitting,  keep your spine neutral and your keep feet flat on the floor. Use a footrest, if necessary, and keep your thighs parallel to the floor. Avoid rounding your shoulders, and avoid tilting your head forward.  When working at a desk or a computer, keep your desk at a height where your hands are slightly lower than your elbows. Slide your chair under your desk so you are close enough to maintain good posture.  When working at a computer, place your monitor at a height where you are looking straight ahead and you do not have to tilt your head forward or downward to look at the screen. Resting When lying down and resting, avoid positions that are most painful for you. Try to support your neck in a neutral position. You can use a contour pillow or a small rolled-up towel. Your pillow should support your neck but not push on it. This information is not intended to replace advice given to you by your health care provider. Make sure you discuss any questions you have with your health care provider. Document Released: 08/12/2005 Document Revised: 04/18/2016 Document Reviewed: 07/19/2015 Elsevier Interactive Patient Education  2018 [employer] Sinus Rinse What is a sinus rinse? A sinus rinse is a simple home treatment that is used to  rinse your sinuses with a sterile mixture of salt and water (saline solution). Sinuses are air-filled spaces in your skull behind the bones of your face and forehead that open into your nasal cavity. You will use the following:  Saline solution.  Neti pot or spray bottle. This releases the saline solution into your nose and through your sinuses. Neti pots and spray bottles can be purchased at [employer], a health food store, or online.  When would I do a sinus rinse? A sinus rinse can help to clear mucus, dirt, dust, or pollen from the nasal cavity. You may do a sinus rinse when you have a cold, a virus, nasal allergy symptoms, a sinus infection, or stuffiness in the nose or sinuses. If you are considering a sinus rinse:  Ask your child's health care provider before performing a sinus rinse on your child.  Do not do a sinus rinse if you have had ear or nasal surgery, ear infection, or blocked ears.  How do I do a sinus rinse?  Wash your hands.  Disinfect your device according to the directions provided and then dry it.  Use the solution that comes with your device or one that is sold separately in stores. Follow the mixing directions on the package.  Fill your device with the amount of saline solution as directed by the device instructions.  Stand over a sink and tilt your head sideways over the sink.  Place the spout of the device in your upper nostril (the one closer to the ceiling).  Gently pour or squeeze the saline solution into the nasal cavity. The liquid should drain to the lower nostril if you are not overly congested.  Gently blow your nose. Blowing too hard may cause ear pain.  Repeat in the other nostril.  Clean and rinse your device with clean water and then air-dry it. Are there risks of a sinus rinse? Sinus rinse is generally very safe and effective. However, there are a few risks, which include:  A burning sensation in the sinuses. This may happen if  you do not make the saline solution as directed. Make sure to follow all directions when making the saline solution.  Infection from  contaminated water. This is rare, but possible.  Nasal irritation.  This information is not intended to replace advice given to you by your health care provider. Make sure you discuss any questions you have with your health care provider. Document Released: 03/09/2014 Document Revised: 07/09/2016 Document Reviewed: 12/28/2013 Elsevier Interactive Patient Education  2017 [employer] Nonallergic Rhinitis  1. Nonallergic rhinitis is a term used by allergist to describe inflammation in the nose that is not due to an allergic source (i.e., pollens, mold, animal dander or dust mites). 2. Nonallergic rhinitis can mimic many of the symptoms caused by allergies.  This includes a runny nose ("rhinorrhea"), sneezing, congestion, ear fullness and post nasal drip. 3. These symptoms usually occur year round but can be made worse by many environmental factors including weather change, irritants such as cigarette smoke, fumes, perfumes, detergents and many others. These are not allergens, but are irritants, and do not cause the formation of antibodies like true allergens.  For this reason most people with nonallergic rhinitis have negative skin tests. 4. Unfortunately, we have a poor understanding of what causes nonallergic rhinitis but certain factors such as deviated septum, sinusitis and nasal polyps may contribute to the symptoms.  Due to the poor understanding of what causes nonallergic rhinitis it cannot be cured and our treatment is only symptomatic to control symptoms. 5. Important factors in the successful treatment of nonallergic rhinitis include avoidance of the irritants that cause symptoms; for example, people who continue to smoke may never improve.  Continuous therapy works better than intermittent.  This may mean taking medications once daily or 3-4 times a  day. 6. Helpful treatments include nasal saline lavage, nasal ipratropium (Atrovent), nasal steroids, and decongestants.  Pure antihistamines don't seem to work very well.  Immunotherapy (allergy shots) has no role in the treatment of nonallergic rhinitis.  Several medications may have to be tried before a good combination is found for you.  These medications, in general, are safe for long term use.  Tolerance may develop to the therapeutic effects of these medications: Frequently changing between several helpful medications can prevent this should it occur.

## 2017-12-01 ENCOUNTER — Encounter: Payer: Self-pay | Admitting: Physician Assistant

## 2017-12-01 ENCOUNTER — Ambulatory Visit: Payer: Self-pay | Admitting: *Deleted

## 2017-12-01 VITALS — BP 121/93 | HR 83

## 2017-12-01 DIAGNOSIS — R42 Dizziness and giddiness: Secondary | ICD-10-CM

## 2017-12-01 NOTE — Progress Notes (Signed)
Pt c/o feeling like she is being pulled to the ground when she changes positions too quickly or turns her head to either side. No Hx of vertigo. Denies room spinning sensation. If looking straight ahead and does not change positions quickly, she notes that sx do not occur. Sx mostly resolve after a couple of minutes.  BP slightly elevated. Denies HA between episodes.  Discussed trying Meclizine otc 25mg  TID today for sx. She is agreeable to this.  She does report some sinus pressure as well which has been going on for a few days prior. Has been using Flonase, saline spray, Claritin and Singulair regularly.

## 2017-12-16 ENCOUNTER — Encounter: Payer: Self-pay | Admitting: Registered Nurse

## 2017-12-16 ENCOUNTER — Ambulatory Visit: Payer: Self-pay | Admitting: Registered Nurse

## 2017-12-16 VITALS — BP 108/70 | HR 81 | Temp 98.3°F

## 2017-12-16 DIAGNOSIS — M542 Cervicalgia: Secondary | ICD-10-CM

## 2017-12-16 MED ORDER — IBUPROFEN 800 MG PO TABS: 800.0000 mg | ORAL_TABLET | Freq: Three times a day (TID) | ORAL | 0 refills | Status: AC | PRN

## 2017-12-16 MED ORDER — CYCLOBENZAPRINE HCL 10 MG PO TABS: 5.0000 mg | ORAL_TABLET | Freq: Three times a day (TID) | ORAL | 0 refills | Status: DC | PRN

## 2017-12-16 MED ORDER — MENTHOL (TOPICAL ANALGESIC) 4 % EX GEL: 1.0000 "application " | Freq: Four times a day (QID) | CUTANEOUS | 0 refills | Status: AC | PRN

## 2017-12-16 NOTE — Progress Notes (Signed)
Subjective:    Patient ID: Rebecca Sweeney, female    DOB: 1960/09/25, 57 y.o.   MRN: [id number]  57 y/o caucasian married established female pt c/o recurrent posterior neck and upper back pain and spasms. Last seenon 06 November 2017 for neck pain but was more lateral this time more occipital. Flexeril, motrin and biofreeze helped but has run out would like refill.  Reports she has been lifting  A lot of heavy [place] at work otherwise denied injury or trauma. Using Motrin, Biofreeze and Thermcare heat patches at home with some relief but pain continues. Limited neck ROM 2/2 pain. Denied loss of bowel bladder control, saddle paresthesias or arm/leg weakness.     Review of Systems  Constitutional: Negative for activity change, appetite change, chills, diaphoresis, fatigue and fever.  HENT: Negative for trouble swallowing and voice change.   Eyes: Negative for photophobia and visual disturbance.  Respiratory: Negative for cough, shortness of breath and wheezing.   Cardiovascular: Negative for palpitations.  Gastrointestinal: Negative for diarrhea, nausea and vomiting.  Endocrine: Negative for cold intolerance and heat intolerance.  Genitourinary: Negative for difficulty urinating, dysuria and enuresis.  Musculoskeletal: Positive for back pain, myalgias and neck pain. Negative for arthralgias, gait problem, joint swelling and neck stiffness.  Skin: Negative for color change, pallor, rash and wound.  Allergic/Immunologic: Negative for food allergies.  Neurological: Negative for dizziness, tremors, seizures, syncope, facial asymmetry, speech difficulty, weakness, light-headedness, numbness and headaches.  Hematological: Negative for adenopathy. Does not bruise/bleed easily.  Psychiatric/Behavioral: Negative for agitation, confusion and sleep disturbance.       Objective:   Physical Exam  Constitutional: She is oriented to person, place, and time. Vital signs are normal. She appears well-developed  and well-nourished. She is active and cooperative.  Non-toxic appearance. She does not have a sickly appearance. She appears ill. No distress.  HENT:  Head: Normocephalic and atraumatic.  Right Ear: Hearing and external ear normal.  Left Ear: Hearing and external ear normal.  Nose: Nose normal. No mucosal edema, rhinorrhea, nose lacerations, sinus tenderness, nasal deformity, septal deviation or nasal septal hematoma. No epistaxis.  No foreign bodies.  Mouth/Throat: Uvula is midline, oropharynx is clear and moist and mucous membranes are normal. Mucous membranes are not pale, not dry and not cyanotic. No oral lesions. No trismus in the jaw. No dental abscesses, uvula swelling or lacerations. No oropharyngeal exudate, posterior oropharyngeal edema, posterior oropharyngeal erythema or tonsillar abscesses.  Eyes: Pupils are equal, round, and reactive to light. Conjunctivae, EOM and lids are normal. Right eye exhibits no chemosis, no discharge, no exudate and no hordeolum. No foreign body present in the right eye. Left eye exhibits no chemosis, no discharge, no exudate and no hordeolum. No foreign body present in the left eye. Right conjunctiva is not injected. Right conjunctiva has no hemorrhage. Left conjunctiva is not injected. Left conjunctiva has no hemorrhage. No scleral icterus. Right eye exhibits normal extraocular motion and no nystagmus. Left eye exhibits normal extraocular motion and no nystagmus. Right pupil is round and reactive. Left pupil is round and reactive. Pupils are equal.  Neck: Trachea normal and phonation normal. Neck supple. Muscular tenderness present. No tracheal tenderness and no spinous process tenderness present. No neck rigidity. Decreased range of motion present. No tracheal deviation, no edema and no erythema present. No thyroid mass and no thyromegaly present.    Right rotation decreased compared to left due to pain 15 degrees; lateral bending equal; neck flexion more pain  than  extension; not able to touch chin to chest; bilateral trapezius not tight; paraspinals TTP bilateral cervical C1-C7  Cardiovascular: Normal rate, regular rhythm and intact distal pulses.  Pulses:      Radial pulses are 2+ on the right side, and 2+ on the left side.  Pulmonary/Chest: Effort normal and breath sounds normal. No accessory muscle usage or stridor. No respiratory distress. She has no wheezes. She has no rhonchi. She has no rales. She exhibits no tenderness.  No cough observed in clinic; spoke full sentences without difficulty  Abdominal: Soft. Normal appearance. She exhibits no distension, no fluid wave and no ascites. There is no rigidity and no guarding.  Musculoskeletal: She exhibits tenderness. She exhibits no edema or deformity.       Right shoulder: Normal.       Left shoulder: Normal.       Right elbow: Normal.      Left elbow: Normal.       Right hip: Normal.       Left hip: Normal.       Right knee: Normal.       Left knee: Normal.       Cervical back: She exhibits decreased range of motion, tenderness, pain and spasm. She exhibits no bony tenderness, no swelling, no edema, no deformity, no laceration and normal pulse.       Thoracic back: Normal.       Lumbar back: Normal.       Back:       Right hand: Normal.       Left hand: Normal.  In/out of chair without difficulty; gait sure and steady normal heel toe gait; bilateral hand grasp equal 5/5  Lymphadenopathy:       Head (right side): No submental, no submandibular, no tonsillar, no preauricular, no posterior auricular and no occipital adenopathy present.       Head (left side): No submental, no submandibular, no tonsillar, no preauricular, no posterior auricular and no occipital adenopathy present.    She has no cervical adenopathy.       Right cervical: No superficial cervical, no deep cervical and no posterior cervical adenopathy present.      Left cervical: No superficial cervical, no deep cervical and no  posterior cervical adenopathy present.  Neurological: She is alert and oriented to person, place, and time. She has normal strength. She is not disoriented. She displays no atrophy and no tremor. No cranial nerve deficit or sensory deficit. She exhibits normal muscle tone. She displays no seizure activity. Coordination and gait normal. GCS eye subscore is 4. GCS verbal subscore is 5. GCS motor subscore is 6.  Skin: Skin is warm, dry and intact. No abrasion, no bruising, no burn, no ecchymosis, no laceration, no lesion, no petechiae and no rash noted. She is not diaphoretic. No cyanosis or erythema. No pallor. Nails show no clubbing.  Psychiatric: She has a normal mood and affect. Her speech is normal and behavior is normal. Judgment and thought content normal. Cognition and memory are normal.  Nursing note and vitals reviewed.         Assessment & Plan:  A-cervicalgia  P- ran out refilled and will restart cyclobenazeprine/flexeril 5-10mg  po TID prn muscle spasms #30 RF0 dispensed from [facility]. Discussed may cause drowsiness avoid alcohol and driving after taking flexeril. Ibuprofen 800mg  po TID prn pain #30 RF0 dispensed from [facility]. biofreeze topical qid prn pain 4 UD given to patient from clinic stock  Avoid alcohol intake  and driving while taking cyclobenazeprine/flexeril as drowsiness common side effect.  Slow position changes as medication also lower blood pressure.  Home stretches demonstrated to patient-e.g. Arm circles, walking up wall, chest stretches, neck AROM, chin tucks, knee to chest and rock side to side on back. Self massage or professional prn, foam roller use or tennis/racquetball.  Heat/cryotherapy 15 minutes QID prn.  thermacare 1 applied and another given for patient to use tomorrow as clinic closed Wednesdays at [facility].  Consider physical therapy referral if no improvement with prescribed therapy from [facility] and/or chiropractic care.  Ensure ergonomics correct desk at work avoid  repetitive motions if possible/holding phone/laptop in hand use desk/stand and/or break up lifting items into smaller loads/weights.  Patient was instructed to rest, ice, and ROM exercises.  Activity as tolerated.   Follow up if symptoms persist or worsen especially if loss of bowel/bladder control, arm/leg weakness and/or saddle paresthesias.  [facility]care handout on muscle spasms and neck strain rehab exercises given to patient.  Patient verbalized agreement and understanding of treatment plan and had no further questions at this time.  P2:  Injury Prevention and Fitness.

## 2017-12-16 NOTE — Patient Instructions (Signed)
Muscle Cramps and Spasms Muscle cramps and spasms occur when a muscle or muscles tighten and you have no control over this tightening (involuntary muscle contraction). They are a common problem and can develop in any muscle. The most common place is in the calf muscles of the leg. Muscle cramps and muscle spasms are both involuntary muscle contractions, but there are some differences between the two:  Muscle cramps are painful. They come and go and may last a few seconds to 15 minutes. Muscle cramps are often more forceful and last longer than muscle spasms.  Muscle spasms may or may not be painful. They may also last just a few seconds or much longer.  Certain medical conditions, such as diabetes or Parkinson disease, can make it more likely to develop cramps or spasms. However, cramps or spasms are usually not caused by a serious underlying problem. Common causes include:  Overexertion.  Overuse from repetitive motions, or doing the same thing over and over.  Remaining in a certain position for a long period of time.  Improper preparation, form, or technique while playing a sport or doing an activity.  Dehydration.  Injury.  Side effects of some medicines.  Abnormally low levels of the salts and ions in your blood (electrolytes), especially potassium and calcium. This could happen if you are taking water pills (diuretics) or if you are pregnant.  In many cases, the cause of muscle cramps or spasms is unknown. Follow these instructions at home:  Stay well hydrated. Drink enough fluid to keep your urine clear or pale yellow.  Try massaging, stretching, and relaxing the affected muscle.  If directed, apply heat to tight or tense muscles as often as told by your health care provider. Use the heat source that your health care provider recommends, such as a moist heat pack or a heating pad. ? Place a towel between your skin and the heat source. ? Leave the heat on for 20-30  minutes. ? Remove the heat if your skin turns bright red. This is especially important if you are unable to feel pain, heat, or cold. You may have a greater risk of getting burned.  If directed, put ice on the affected area. This may help if you are sore or have pain after a cramp or spasm. ? Put ice in a plastic bag. ? Place a towel between your skin and the bag. ? Leavethe ice on for 20 minutes, 2-3 times a day.  Take over-the-counter and prescription medicines only as told by your health care provider.  Pay attention to any changes in your symptoms. Contact a health care provider if:  Your cramps or spasms get more severe or happen more often.  Your cramps or spasms do not improve over time. This information is not intended to replace advice given to you by your health care provider. Make sure you discuss any questions you have with your health care provider. Document Released: 02/01/2002 Document Revised: 09/13/2015 Document Reviewed: 05/16/2015 Elsevier Interactive Patient Education  2018 [place]. Cervical Strain and Sprain Rehab Ask your health care provider which exercises are safe for you. Do exercises exactly as told by your health care provider and adjust them as directed. It is normal to feel mild stretching, pulling, tightness, or discomfort as you do these exercises, but you should stop right away if you feel sudden pain or your pain gets worse.Do not begin these exercises until told by your health care provider. Stretching and range of motion exercises These  exercises warm up your muscles and joints and improve the movement and flexibility of your neck. These exercises also help to relieve pain, numbness, and tingling. Exercise A: Cervical side bend  1. Using good posture, sit on a stable chair or stand up. 2. Without moving your shoulders, slowly tilt your left / right ear to your shoulder until you feel a stretch in your neck muscles. You should be looking straight  ahead. 3. Hold for _____15_____ seconds. 4. Repeat with the other side of your neck. Repeat ___5_______ times. Complete this exercise ___3_______ times a day. Exercise B: Cervical rotation  1. Using good posture, sit on a stable chair or stand up. 2. Slowly turn your head to the side as if you are looking over your left / right shoulder. ? Keep your eyes level with the ground. ? Stop when you feel a stretch along the side and the back of your neck. 3. Hold for _____15_____ seconds. 4. Repeat this by turning to your other side. Repeat ___5_______ times. Complete this exercise ____3______ times a day. Exercise C: Thoracic extension and pectoral stretch 1. Roll a towel or a small blanket so it is about 4 inches (10 cm) in diameter. 2. Lie down on your back on a firm surface. 3. Put the towel lengthwise, under your spine in the middle of your back. It should not be not under your shoulder blades. The towel should line up with your spine from your middle back to your lower back. 4. Put your hands behind your head and let your elbows fall out to your sides. 5. Hold for ______15____ seconds. Repeat _____5_____ times. Complete this exercise _____3_____ times a day. Strengthening exercises These exercises build strength and endurance in your neck. Endurance is the ability to use your muscles for a long time, even after your muscles get tired. Exercise D: Upper cervical flexion, isometric 1. Lie on your back with a thin pillow behind your head and a small rolled-up towel under your neck. 2. Gently tuck your chin toward your chest and nod your head down to look toward your feet. Do not lift your head off the pillow. 3. Hold for ______15____ seconds. 4. Release the tension slowly. Relax your neck muscles completely before you repeat this exercise. Repeat ______5____ times. Complete this exercise _____3_____ times a day. Exercise E: Cervical extension, isometric  1. Stand about 6 inches (15 cm) away  from a wall, with your back facing the wall. 2. Place a soft object, about 6-8 inches (15-20 cm) in diameter, between the back of your head and the wall. A soft object could be a small pillow, a ball, or a folded towel. 3. Gently tilt your head back and press into the soft object. Keep your jaw and forehead relaxed. 4. Hold for ____15______ seconds. 5. Release the tension slowly. Relax your neck muscles completely before you repeat this exercise. Repeat _____5_____ times. Complete this exercise ____3______ times a day. Posture and body mechanics  Body mechanics refers to the movements and positions of your body while you do your daily activities. Posture is part of body mechanics. Good posture and healthy body mechanics can help to relieve stress in your body's tissues and joints. Good posture means that your spine is in its natural S-curve position (your spine is neutral), your shoulders are pulled back slightly, and your head is not tipped forward. The following are general guidelines for applying improved posture and body mechanics to your everyday activities. Standing  When standing,  keep your spine neutral and keep your feet about hip-width apart. Keep a slight bend in your knees. Your ears, shoulders, and hips should line up.  When you do a task in which you stand in one place for a long time, place one foot up on a stable object that is 2-4 inches (5-10 cm) high, such as a footstool. This helps keep your spine neutral. Sitting   When sitting, keep your spine neutral and your keep feet flat on the floor. Use a footrest, if necessary, and keep your thighs parallel to the floor. Avoid rounding your shoulders, and avoid tilting your head forward.  When working at a desk or a computer, keep your desk at a height where your hands are slightly lower than your elbows. Slide your chair under your desk so you are close enough to maintain good posture.  When working at a computer, place your monitor  at a height where you are looking straight ahead and you do not have to tilt your head forward or downward to look at the screen. Resting When lying down and resting, avoid positions that are most painful for you. Try to support your neck in a neutral position. You can use a contour pillow or a small rolled-up towel. Your pillow should support your neck but not push on it. This information is not intended to replace advice given to you by your health care provider. Make sure you discuss any questions you have with your health care provider. Document Released: 08/12/2005 Document Revised: 04/18/2016 Document Reviewed: 07/19/2015 Elsevier Interactive Patient Education  [employer]2018 [employer]

## 2017-12-26 ENCOUNTER — Ambulatory Visit: Payer: PRIVATE HEALTH INSURANCE | Admitting: Physician Assistant

## 2018-02-05 ENCOUNTER — Ambulatory Visit: Payer: Self-pay | Admitting: Registered Nurse

## 2018-02-05 ENCOUNTER — Encounter: Payer: Self-pay | Admitting: Registered Nurse

## 2018-02-05 VITALS — BP 110/70 | HR 82 | Temp 97.6°F | Resp 16

## 2018-02-05 DIAGNOSIS — H6593 Unspecified nonsuppurative otitis media, bilateral: Secondary | ICD-10-CM

## 2018-02-05 DIAGNOSIS — H8110 Benign paroxysmal vertigo, unspecified ear: Secondary | ICD-10-CM

## 2018-02-05 MED ORDER — FLUTICASONE PROPIONATE 50 MCG/ACT NA SUSP: 1.0000 | Freq: Two times a day (BID) | NASAL | 1 refills | Status: DC

## 2018-02-05 MED ORDER — MECLIZINE HCL 25 MG PO TABS: 25.0000 mg | ORAL_TABLET | Freq: Four times a day (QID) | ORAL | 0 refills | Status: AC | PRN

## 2018-02-05 MED ORDER — SALINE SPRAY 0.65 % NA SOLN: 2.0000 | NASAL | 0 refills | Status: DC

## 2018-02-05 NOTE — Patient Instructions (Signed)
Vertigo Vertigo is the feeling that you or your surroundings are moving when they are not. Vertigo can be dangerous if it occurs while you are doing something that could endanger you or others, such as driving. What are the causes? This condition is caused by a disturbance in the signals that are sent by your body's sensory systems to your brain. Different causes of a disturbance can lead to vertigo, including:  Infections, especially in the inner ear.  A bad reaction to a drug, or misuse of alcohol and medicines.  Withdrawal from drugs or alcohol.  Quickly changing positions, as when lying down or rolling over in bed.  Migraine headaches.  Decreased blood flow to the brain.  Decreased blood pressure.  Increased pressure in the brain from a head or neck injury, stroke, infection, tumor, or bleeding.  Central nervous system disorders.  What are the signs or symptoms? Symptoms of this condition usually occur when you move your head or your eyes in different directions. Symptoms may start suddenly, and they usually last for less than a minute. Symptoms may include:  Loss of balance and falling.  Feeling like you are spinning or moving.  Feeling like your surroundings are spinning or moving.  Nausea and vomiting.  Blurred vision or double vision.  Difficulty hearing.  Slurred speech.  Dizziness.  Involuntary eye movement (nystagmus).  Symptoms can be mild and cause only slight annoyance, or they can be severe and interfere with daily life. Episodes of vertigo may return (recur) over time, and they are often triggered by certain movements. Symptoms may improve over time. How is this diagnosed? This condition may be diagnosed based on medical history and the quality of your nystagmus. Your health care provider may test your eye movements by asking you to quickly change positions to trigger the nystagmus. This may be called the Dix-Hallpike test, head thrust test, or roll test.  You may be referred to a health care provider who specializes in ear, nose, and throat (ENT) problems (otolaryngologist) or a provider who specializes in disorders of the central nervous system (neurologist). You may have additional testing, including:  A physical exam.  Blood tests.  MRI.  A CT scan.  An electrocardiogram (ECG). This records electrical activity in your heart.  An electroencephalogram (EEG). This records electrical activity in your brain.  Hearing tests.  How is this treated? Treatment for this condition depends on the cause and the severity of the symptoms. Treatment options include:  Medicines to treat nausea or vertigo. These are usually used for severe cases. Some medicines that are used to treat other conditions may also reduce or eliminate vertigo symptoms. These include: ? Medicines that control allergies (antihistamines). ? Medicines that control seizures (anticonvulsants). ? Medicines that relieve depression (antidepressants). ? Medicines that relieve anxiety (sedatives).  Head movements to adjust your inner ear back to normal. If your vertigo is caused by an ear problem, your health care provider may recommend certain movements to correct the problem.  Surgery. This is rare.  Follow these instructions at home: Safety  Move slowly.Avoid sudden body or head movements.  Avoid driving.  Avoid operating heavy machinery.  Avoid doing any tasks that would cause danger to you or others if you would have a vertigo episode during the task.  If you have trouble walking or keeping your balance, try using a cane for stability. If you feel dizzy or unstable, sit down right away.  Return to your normal activities as told by your  health care provider. Ask your health care provider what activities are safe for you. General instructions  Take over-the-counter and prescription medicines only as told by your health care provider.  Avoid certain positions or  movements as told by your health care provider.  Drink enough fluid to keep your urine clear or pale yellow.  Keep all follow-up visits as told by your health care provider. This is important. Contact a health care provider if:  Your medicines do not relieve your vertigo or they make it worse.  You have a fever.  Your condition gets worse or you develop new symptoms.  Your family or friends notice any behavioral changes.  Your nausea or vomiting gets worse.  You have numbness or a "pins and needles" sensation in part of your body. Get help right away if:  You have difficulty moving or speaking.  You are always dizzy.  You faint.  You develop severe headaches.  You have weakness in your hands, arms, or legs.  You have changes in your hearing or vision.  You develop a stiff neck.  You develop sensitivity to light. This information is not intended to replace advice given to you by your health care provider. Make sure you discuss any questions you have with your health care provider. Document Released: 05/22/2005 Document Revised: 01/24/2016 Document Reviewed: 12/05/2014 Elsevier Interactive Patient Education  2018 [employer]. Otitis Media With Effusion, Pediatric Otitis media with effusion (OME) occurs when there is inflammation of the middle ear and fluid in the middle ear space. There are no signs and symptoms of infection. The middle ear space contains air and the bones for hearing. Air in the middle ear space helps to transmit sound to the brain. OME is a common condition in children, and it often occurs after an ear infection. This condition may be present for several weeks or longer after an ear infection. Most cases of this condition get better on their own. What are the causes? OME is caused by a blockage of the eustachian tube in one or both ears. These tubes drain fluid in the ears to the back of the nose (nasopharynx). If the tissue in the tube swells up (edema),  the tube closes. This prevents fluid from draining. Blockage can be caused by:  Ear infections.  Colds and other upper respiratory infections.  Allergies.  Irritants, such as tobacco smoke.  Enlarged adenoids. The adenoids are areas of soft tissue located high in the back of the throat, behind the nose and the roof of the mouth. They are part of the body's natural defense (immune) system.  A mass in the nasopharynx.  Damage to the ear caused by pressure changes (barotrauma).  What increases the risk? Your child is more likely to develop this condition if:  He or she has repeated ear and sinus infections.  He or she has allergies.  He or she is exposed to tobacco smoke.  He or she attends daycare.  He or she is not breastfed.  What are the signs or symptoms? Symptoms of this condition may not be obvious. Sometimes this condition does not have any symptoms, or symptoms may overlap with those of a cold or upper respiratory tract illness. Symptoms of this condition include:  Temporary hearing loss.  A feeling of fullness in the ear without pain.  Irritability or agitation.  Balance (vestibular) problems.  As a result of hearing loss, your child may:  Listen to the TV at a loud volume.  Not  respond to questions.  Ask "What?" often when spoken to.  Mistake or confuse one sound or word for another.  Perform poorly at school.  Have a poor attention span.  Become agitated or irritated easily.  How is this diagnosed? This condition is diagnosed with an ear exam. Your child's health care provider will look inside your child's ear with an instrument (otoscope) to check for redness, swelling, and fluid. Other tests may be done, including:  A test to check the movement of the eardrum (pneumatic otoscopy). This is done by squeezing a small amount of air into the ear.  A test that changes air pressure in the middle ear to check how well the eardrum moves and to see if  the eustachian tube is working (tympanogram).  Hearing test (audiogram). This test involves playing tones at different pitches to see if your child can hear each tone.  How is this treated? Treatment for this condition depends on the cause. In many cases, the fluid goes away on its own. In some cases, your child may need a procedure to create a hole in the eardrum to allow fluid to drain (myringotomy) and to insert small drainage tubes (tympanostomy tubes) into the eardrums. These tubes help to drain fluid and prevent infection. This procedure may be recommended if:  OME does not get better over several months.  Your child has many ear infections within several months.  Your child has noticeable hearing loss.  Your child has problems with speech and language development.  Surgery may also be done to remove the adenoids (adenoidectomy). Follow these instructions at home:  Give over-the-counter and prescription medicines only as told by your child's health care provider.  Keep children away from any tobacco smoke.  Keep all follow-up visits as told by your child's health care provider. This is important. How is this prevented?  Keep your child's vaccinations up to date. Make sure your child gets all recommended vaccinations, including a pneumonia and flu vaccine.  Encourage hand washing. Your child should wash his or her hands often with soap and water. If there is no soap and water, he or she should use hand sanitizer.  Avoid exposing your child to tobacco smoke.  Breastfeed your baby, if possible. Babies who are breastfed as long as possible are less likely to develop this condition. Contact a health care provider if:  Your child's hearing does not get better after 3 months.  Your child's hearing is worse.  Your child has ear pain.  Your child has a fever.  Your child has drainage from the ear.  Your child is dizzy.  Your child has a lump on his or her neck. Get help  right away if:  Your child has bleeding from the nose.  Your child cannot move part of her or his face.  Your child has trouble breathing.  Your child cannot smell.  Your child develops severe congestion.  Your child develops weakness.  Your child who is younger than 3 months has a temperature of 100F (38C) or higher. Summary  Otitis media with effusion (OME) occurs when there is inflammation of the middle ear and fluid in the middle ear space.  This condition is caused by blockage of one or both eustachian tubes, which drain fluid in the ears to the back of the nose.  Symptoms of this condition can include temporary hearing loss, a feeling of fullness in the ear, irritability or agitation, and balance (vestibular) problems. Sometimes, there are no  symptoms.  This condition is diagnosed with an ear exam and tests, such as pneumatic otoscopy, tympanogram, and audiogram.  Treatment for this condition depends on the cause. In many cases, the fluid goes away on its own. This information is not intended to replace advice given to you by your health care provider. Make sure you discuss any questions you have with your health care provider. Document Released: 11/02/2003 Document Revised: 07/04/2016 Document Reviewed: 07/04/2016 Elsevier Interactive Patient Education  2017 [employer].

## 2018-02-05 NOTE — Progress Notes (Signed)
Subjective:    Patient ID: Rebecca Sweeney, female    DOB: 12-Aug-1961, 57 y.o.   MRN: [id number]  57y/o cacuasian female established patient here for evaluation of blood pressure due to headache and not feeling well while working this afternoon.  + sick contacts at work and seasonal allergies this spring  Last vertigo Apr 2019 has meclizine/dramamine at home but currently not using.  She is taking her allergy medications needs refill on saline.     Review of Systems  Constitutional: Positive for fatigue. Negative for activity change, appetite change, chills, diaphoresis, fever and unexpected weight change.  HENT: Negative for congestion, dental problem, drooling, ear discharge, ear pain, facial swelling, hearing loss, mouth sores, nosebleeds, postnasal drip, rhinorrhea, sinus pressure, sinus pain, sneezing, sore throat, tinnitus, trouble swallowing and voice change.   Eyes: Negative for photophobia, pain, discharge, redness, itching and visual disturbance.  Respiratory: Negative for cough, choking, chest tightness, shortness of breath, wheezing and stridor.   Cardiovascular: Negative for chest pain, palpitations and leg swelling.  Gastrointestinal: Negative for abdominal distention, abdominal pain, blood in stool, constipation, diarrhea, nausea and vomiting.  Endocrine: Negative for cold intolerance and heat intolerance.  Genitourinary: Negative for difficulty urinating, dysuria and hematuria.  Musculoskeletal: Negative for arthralgias, back pain, gait problem, joint swelling, myalgias, neck pain and neck stiffness.  Skin: Negative for color change, pallor, rash and wound.  Allergic/Immunologic: Positive for environmental allergies. Negative for food allergies.  Neurological: Positive for dizziness and headaches. Negative for tremors, seizures, syncope, facial asymmetry, speech difficulty, weakness, light-headedness and numbness.  Hematological: Negative for adenopathy. Does not bruise/bleed  easily.  Psychiatric/Behavioral: Negative for agitation, behavioral problems, confusion and sleep disturbance.       Objective:   Physical Exam  Constitutional: She is oriented to person, place, and time. Vital signs are normal. She appears well-developed and well-nourished. She is active and cooperative.  Non-toxic appearance. She does not have a sickly appearance. She does not appear ill. No distress.  HENT:  Head: Normocephalic and atraumatic.  Right Ear: Hearing, external ear and ear canal normal. A middle ear effusion is present.  Left Ear: Hearing, external ear and ear canal normal. A middle ear effusion is present.  Nose: Mucosal edema and rhinorrhea present. No nose lacerations, sinus tenderness, nasal deformity, septal deviation or nasal septal hematoma. No epistaxis.  No foreign bodies. Right sinus exhibits no maxillary sinus tenderness and no frontal sinus tenderness. Left sinus exhibits no maxillary sinus tenderness and no frontal sinus tenderness.  Mouth/Throat: Uvula is midline and mucous membranes are normal. Mucous membranes are not pale, not dry and not cyanotic. She does not have dentures. No oral lesions. No trismus in the jaw. Normal dentition. No dental abscesses, uvula swelling, lacerations or dental caries. Posterior oropharyngeal edema and posterior oropharyngeal erythema present. No oropharyngeal exudate or tonsillar abscesses.  Bilateral allergic shiners; cobblestoning posterior pharynx; bilateral TMs air fluid level clear; clear discharge bilateral nares  Eyes: Pupils are equal, round, and reactive to light. Conjunctivae, EOM and lids are normal. Right eye exhibits no chemosis, no discharge, no exudate and no hordeolum. No foreign body present in the right eye. Left eye exhibits no chemosis, no discharge, no exudate and no hordeolum. No foreign body present in the left eye. Right conjunctiva is not injected. Right conjunctiva has no hemorrhage. Left conjunctiva is not  injected. Left conjunctiva has no hemorrhage. No scleral icterus. Right eye exhibits normal extraocular motion and no nystagmus. Left eye exhibits normal extraocular motion  and no nystagmus. Right pupil is round and reactive. Left pupil is round and reactive. Pupils are equal.  Neck: Trachea normal, normal range of motion and phonation normal. Neck supple. No tracheal tenderness, no spinous process tenderness and no muscular tenderness present. No neck rigidity. No tracheal deviation, no edema, no erythema and normal range of motion present. No thyroid mass and no thyromegaly present.  Cardiovascular: Normal rate, regular rhythm, S1 normal, S2 normal, normal heart sounds and intact distal pulses. PMI is not displaced. Exam reveals no gallop, no distant heart sounds and no friction rub.  No murmur heard. Pulmonary/Chest: Effort normal and breath sounds normal. No accessory muscle usage or stridor. No respiratory distress. She has no decreased breath sounds. She has no wheezes. She has no rhonchi. She has no rales. She exhibits no tenderness.  No cough observed in exam room; spoke full sentences without difficulty  Abdominal: Soft. Normal appearance. She exhibits no distension and no fluid wave. There is no rigidity and no guarding.  Musculoskeletal: Normal range of motion. She exhibits no edema, tenderness or deformity.       Right shoulder: Normal.       Left shoulder: Normal.       Right elbow: Normal.      Left elbow: Normal.       Right hip: Normal.       Left hip: Normal.       Right knee: Normal.       Left knee: Normal.       Cervical back: Normal.       Thoracic back: Normal.       Lumbar back: Normal.       Right hand: Normal.       Left hand: Normal.  Lymphadenopathy:       Head (right side): No submental, no submandibular, no tonsillar, no preauricular, no posterior auricular and no occipital adenopathy present.       Head (left side): No submental, no submandibular, no tonsillar, no  preauricular, no posterior auricular and no occipital adenopathy present.    She has no cervical adenopathy.       Right cervical: No superficial cervical, no deep cervical and no posterior cervical adenopathy present.      Left cervical: No superficial cervical, no deep cervical and no posterior cervical adenopathy present.  Neurological: She is alert and oriented to person, place, and time. She has normal strength. She is not disoriented. She displays no atrophy and no tremor. No cranial nerve deficit or sensory deficit. She exhibits normal muscle tone. She displays no seizure activity. Coordination and gait normal. GCS eye subscore is 4. GCS verbal subscore is 5. GCS motor subscore is 6.  In/out of chair without difficulty; gait sure and steady in hallway  Skin: Skin is warm, dry and intact. Capillary refill takes less than 2 seconds. No abrasion, no bruising, no burn, no ecchymosis, no laceration, no lesion, no petechiae and no rash noted. She is not diaphoretic. No cyanosis or erythema. No pallor. Nails show no clubbing.  Psychiatric: She has a normal mood and affect. Her speech is normal and behavior is normal. Judgment and thought content normal. She is not actively hallucinating. Cognition and memory are normal. She is attentive.  Nursing note and vitals reviewed.         Assessment & Plan:  A-otitis media effusion bilaterally; bppv  P-Supportive treatment.   No evidence of invasive bacterial infection, non toxic and well hydrated.  This  is most likely self limiting viral infection.  I do not see where any further testing or imaging is necessary at this time.   I will suggest supportive care, rest, good hygiene and encourage the patient to take adequate fluids.  The patient is to return to clinic or EMERGENCY ROOM if symptoms worsen or change significantly e.g. ear pain, fever, purulent discharge from ears or bleeding.  Exitcare handout on otitis media with effusion given to patient.   Patient verbalized agreement and understanding of treatment plan.    Patient may use normal saline nasal spray 2 sprays each nostril q2h wa as needed 1 bottle given to patient from clinic stock. flonase 1 spray each nostril BID #1 RF1 electronic Rx to her pharmacy of choice.  Phenylephrine 10mg  po QID prn rhinitis 4 UD given to patient from clinic stock.  Patient denied personal or family history of ENT cancer.  OTC antihistamine of choice claritin 10mg  po daily.  Avoid triggers if possible.  Shower prior to bedtime if exposed to triggers.  If allergic dust/dust mites recommend mattress/pillow covers/encasements; washing linens, vacuuming, sweeping, dusting weekly.  Call or return to clinic as needed if these symptoms worsen or fail to improve as anticipated.   Exitcare handout on allergic rhinitis and sinus rinse given to patient.  Patient verbalized understanding of instructions, agreed with plan of care and had no further questions at this time.   Discussed with patient otitis media with effusion probably causing vertigo but could also be age. Meclizine 25mg  po prn has been helpful in the past and patient has at home. Supportive treatment may take up to 4 doses meclizine per day max 100mg  per 24 hours. Discussed signs/symptoms stroke.recommended not driving during vertigo episodes. Follow up if aphasia, dysphasia, visual changes, weakness, fall, worst headache of life, incoordination, fever, ear discharge. Consider ENT evaluation/follow up with PCM if worsening symptoms not controlled with meclizine  Exitcare handout on vertigo. Patient verbalized understanding of information/agreed with plan of care and had no further questions at this time.  P2:  Avoidance and hand washing.

## 2018-02-16 ENCOUNTER — Encounter: Payer: Self-pay | Admitting: Registered Nurse

## 2018-02-16 LAB — POCT URINALYSIS DIPSTICK

## 2018-02-16 MED ORDER — SULFAMETHOXAZOLE-TRIMETHOPRIM 800-160 MG PO TABS: 1.0000 | ORAL_TABLET | Freq: Two times a day (BID) | ORAL | 0 refills | Status: DC

## 2018-02-16 MED ORDER — PHENAZOPYRIDINE HCL 200 MG PO TABS: 200.0000 mg | ORAL_TABLET | Freq: Three times a day (TID) | ORAL | 0 refills | Status: DC

## 2018-02-16 NOTE — Progress Notes (Signed)
error 

## 2018-02-17 ENCOUNTER — Ambulatory Visit: Payer: Self-pay | Admitting: Registered Nurse

## 2018-05-05 ENCOUNTER — Encounter: Payer: Self-pay | Admitting: Registered Nurse

## 2018-05-05 ENCOUNTER — Ambulatory Visit: Payer: Self-pay | Admitting: Registered Nurse

## 2018-05-05 VITALS — BP 105/76 | HR 76 | Temp 98.5°F

## 2018-05-05 DIAGNOSIS — F4322 Adjustment disorder with anxiety: Secondary | ICD-10-CM

## 2018-05-05 DIAGNOSIS — F5102 Adjustment insomnia: Secondary | ICD-10-CM

## 2018-05-05 MED ORDER — MELATONIN 1 MG PO TABS
1.0000 mg | ORAL_TABLET | Freq: Every evening | ORAL | Status: AC | PRN
Start: 2018-05-05 — End: 2018-05-20

## 2018-05-05 MED ORDER — ZOLPIDEM TARTRATE 5 MG PO TABS: 5.0000 mg | ORAL_TABLET | Freq: Every evening | ORAL | 1 refills | Status: DC | PRN

## 2018-05-05 NOTE — Patient Instructions (Signed)
Panic Attack  A panic attack is a sudden episode of severe anxiety, fear, or discomfort that causes physical and emotional symptoms. The attack may be in response to something frightening, or it may occur for no known reason.  Symptoms of a panic attack can be similar to symptoms of a heart attack or stroke. It is important to see your health care provider when you have a panic attack so that these conditions can be ruled out.  A panic attack is a symptom of another condition. Most panic attacks go away with treatment of the underlying problem. If you have panic attacks often, you may have a condition called panic disorder.  What are the causes?  A panic attack may be caused by:  · An extreme, life-threatening situation, such as a war or natural disaster.  · An anxiety disorder, such as post-traumatic stress disorder.  · Depression.  · Certain medical conditions, including heart problems, neurological conditions, and infections.  · Certain over-the-counter and prescription medicines.  · Illegal drugs that increase heart rate and blood pressure, such as methamphetamine.  · Alcohol.  · Supplements that increase anxiety.  · Panic disorder.    What increases the risk?  You are more likely to develop this condition if:  · You have an anxiety disorder.  · You have another mental health condition.  · You take certain medicines.  · You use alcohol, illegal drugs, or other substances.  · You are under extreme stress.  · A life event is causing increased feelings of anxiety and depression.    What are the signs or symptoms?  A panic attack starts suddenly, usually lasts about 20 minutes, and occurs with one or more of the following:  · A pounding heart.  · A feeling that your heart is beating irregularly or faster than normal (palpitations).  · Sweating.  · Trembling or shaking.  · Shortness of breath or feeling smothered.  · Feeling choked.  · Chest pain or discomfort.  · Nausea or a strange feeling in your  stomach.  · Dizziness, feeling lightheaded, or feeling like you might faint.  · Chills or hot flashes.  · Numbness or tingling in your lips, hands, or feet.  · Feeling confused, or feeling that you are not yourself.  · Fear of losing control or being emotionally unstable.  · Fear of dying.    How is this diagnosed?  A panic attack is diagnosed with an assessment by your health care provider. During the assessment your health care provider will ask questions about:  · Your history of anxiety, depression, and panic attacks.  · Your medical history.  · Whether you drink alcohol, use illegal drugs, take supplements, or take medicines. Be honest about your substance use.    Your health care provider may also:  · Order blood tests or other kinds of tests to rule out serious medical conditions.  · Refer you to a mental health professional for further evaluation.    How is this treated?  Treatment depends on the cause of the panic attack:  · If the cause is a medical problem, your health care provider will either treat that problem or refer you to a specialist.  · If the cause is emotional, you may be given anti-anxiety medicines or referred to a counselor. These medicines may reduce how often attacks happen, reduce how severe the attacks are, and lower anxiety.  · If the cause is a medicine, your health care provider may   tell you to stop the medicine, change your dose, or take a different medicine.  · If the cause is a drug, treatment may involve letting the drug wear off and taking medicine to help the drug leave your body or to counteract its effects. Attacks caused by drug abuse may continue even if you stop using the drug.    Follow these instructions at home:  · Take over-the-counter and prescription medicines only as told by your health care provider.  · If you feel anxious, limit your caffeine intake.  · Take good care of your physical and mental health by:  ? Eating a balanced diet that includes plenty of fresh  fruits and vegetables, whole grains, lean meats, and low-fat dairy.  ? Getting plenty of rest. Try to get 7-8 hours of uninterrupted sleep each night.  ? Exercising regularly. Try to get 30 minutes of physical activity at least 5 days a week.  ? Not smoking. Talk to your health care provider if you need help quitting.  ? Limiting alcohol intake to no more than 1 drink a day for nonpregnant women and 2 drinks a day for men. One drink equals 12 oz of beer, 5 oz of wine, or 1½ oz of hard liquor.  · Keep all follow-up visits as told by your health care provider. This is important. Panic attacks may have underlying physical or emotional problems that take time to accurately diagnose.  Contact a health care provider if:  · Your symptoms do not improve, or they get worse.  · You are not able to take your medicine as prescribed because of side effects.  Get help right away if:  · You have serious thoughts about hurting yourself or others.  · You have symptoms of a panic attack. Do not drive yourself to the hospital. Have someone else drive you or call an ambulance.  If you ever feel like you may hurt yourself or others, or you have thoughts about taking your own life, get help right away. You can go to your nearest emergency department or call:  · Your local emergency services (911 in the [place]  · A suicide crisis helpline, such as the National Suicide Prevention Lifeline at 1-800-273-8255. This is open 24 hours a day.    Summary  · A panic attack is a sign of a serious health or mental health condition. Get help right away. Do not drive yourself to the hospital. Have someone else drive you or call an ambulance.  · Always see a health care provider to have the reasons for the panic attack correctly diagnosed.  · If your panic attack was caused by a physical problem, follow your health care provider's suggestions for medicine, referral to a specialist, and lifestyle changes.  · If your panic attack was caused by an  emotional problem, follow through with counseling from a qualified mental health specialist.  · If you feel like you may hurt yourself or others, call 911 and get help right away.  This information is not intended to replace advice given to you by your health care provider. Make sure you discuss any questions you have with your health care provider.  Document Released: 08/12/2005 Document Revised: 09/20/2016 Document Reviewed: 09/20/2016  [employer]vier Interactive Patient Education © 2018 [employer]      Living With Anxiety  After being diagnosed with an anxiety disorder, you may be relieved to know why you have felt or behaved a certain way. It is natural to   also feel overwhelmed about the treatment ahead and what it will mean for your life. With care and support, you can manage this condition and recover from it.  How to cope with anxiety  Dealing with stress  Stress is your body’s reaction to life changes and events, both good and bad. Stress can last just a few hours or it can be ongoing. Stress can play a major role in anxiety, so it is important to learn both how to cope with stress and how to think about it differently.  Talk with your health care provider or a counselor to learn more about stress reduction. He or she may suggest some stress reduction techniques, such as:  · Music therapy. This can include creating or listening to music that you enjoy and that inspires you.  · Mindfulness-based meditation. This involves being aware of your normal breaths, rather than trying to control your breathing. It can be done while sitting or walking.  · Centering prayer. This is a kind of meditation that involves focusing on a word, phrase, or sacred image that is meaningful to you and that brings you peace.  · Deep breathing. To do this, expand your stomach and inhale slowly through your nose. Hold your breath for 3-5 seconds. Then exhale slowly, allowing your stomach muscles to relax.  · Self-talk. This is a skill  where you identify thought patterns that lead to anxiety reactions and correct those thoughts.  · Muscle relaxation. This involves tensing muscles then relaxing them.    Choose a stress reduction technique that fits your lifestyle and personality. Stress reduction techniques take time and practice. Set aside 5-15 minutes a day to do them. Therapists can offer training in these techniques. The training may be covered by some insurance plans. Other things you can do to manage stress include:  · Keeping a stress diary. This can help you learn what triggers your stress and ways to control your response.  · Thinking about how you respond to certain situations. You may not be able to control everything, but you can control your reaction.  · Making time for activities that help you relax, and not feeling guilty about spending your time in this way.    Therapy combined with coping and stress-reduction skills provides the best chance for successful treatment.  Medicines  Medicines can help ease symptoms. Medicines for anxiety include:  · Anti-anxiety drugs.  · Antidepressants.  · Beta-blockers.    Medicines may be used as the main treatment for anxiety disorder, along with therapy, or if other treatments are not working. Medicines should be prescribed by a health care provider.  Relationships  Relationships can play a big part in helping you recover. Try to spend more time connecting with trusted friends and family members. Consider going to couples counseling, taking family education classes, or going to family therapy. Therapy can help you and others better understand the condition.  How to recognize changes in your condition  Everyone has a different response to treatment for anxiety. Recovery from anxiety happens when symptoms decrease and stop interfering with your daily activities at home or work. This may mean that you will start to:  · Have better concentration and focus.  · Sleep better.  · Be less  irritable.  · Have more energy.  · Have improved memory.    It is important to recognize when your condition is getting worse. Contact your health care provider if your symptoms interfere with home or work and   you do not feel like your condition is improving.  Where to find help and support:  You can get help and support from these sources:  · Self-help groups.  · Online and community organizations.  · A trusted spiritual leader.  · Couples counseling.  · Family education classes.  · Family therapy.    Follow these instructions at home:  · Eat a healthy diet that includes plenty of vegetables, fruits, whole grains, low-fat dairy products, and lean protein. Do not eat a lot of foods that are high in solid fats, added sugars, or salt.  · Exercise. Most adults should do the following:  ? Exercise for at least 150 minutes each week. The exercise should increase your heart rate and make you sweat (moderate-intensity exercise).  ? Strengthening exercises at least twice a week.  · Cut down on caffeine, tobacco, alcohol, and other potentially harmful substances.  · Get the right amount and quality of sleep. Most adults need 7-9 hours of sleep each night.  · Make choices that simplify your life.  · Take over-the-counter and prescription medicines only as told by your health care provider.  · Avoid caffeine, alcohol, and certain over-the-counter cold medicines. These may make you feel worse. Ask your pharmacist which medicines to avoid.  · Keep all follow-up visits as told by your health care provider. This is important.  Questions to ask your health care provider  · Would I benefit from therapy?  · How often should I follow up with a health care provider?  · How long do I need to take medicine?  · Are there any long-term side effects of my medicine?  · Are there any alternatives to taking medicine?  Contact a health care provider if:  · You have a hard time staying focused or finishing daily tasks.  · You spend many hours a  day feeling worried about everyday life.  · You become exhausted by worry.  · You start to have headaches, feel tense, or have nausea.  · You urinate more than normal.  · You have diarrhea.  Get help right away if:  · You have a racing heart and shortness of breath.  · You have thoughts of hurting yourself or others.  If you ever feel like you may hurt yourself or others, or have thoughts about taking your own life, get help right away. You can go to your nearest emergency department or call:  · Your local emergency services (911 in the [place]  · A suicide crisis helpline, such as the National Suicide Prevention Lifeline at 1-800-273-8255. This is open 24-hours a day.    Summary  · Taking steps to deal with stress can help calm you.  · Medicines cannot cure anxiety disorders, but they can help ease symptoms.  · Family, friends, and partners can play a big part in helping you recover from an anxiety disorder.  This information is not intended to replace advice given to you by your health care provider. Make sure you discuss any questions you have with your health care provider.  Document Released: 08/06/2016 Document Revised: 08/06/2016 Document Reviewed: 08/06/2016  [employer] Interactive Patient Education © 2018 [employer]    Insomnia  Insomnia is a sleep disorder that makes it difficult to fall asleep or to stay asleep. Insomnia can cause tiredness (fatigue), low energy, difficulty concentrating, mood swings, and poor performance at work or school.  There are three different ways to classify insomnia:  · Difficulty falling asleep.  ·   Difficulty staying asleep.  · Waking up too early in the morning.    Any type of insomnia can be long-term (chronic) or short-term (acute). Both are common. Short-term insomnia usually lasts for three months or less. Chronic insomnia occurs at least three times a week for longer than three months.  What are the causes?  Insomnia may be caused by another condition, situation, or  substance, such as:  · Anxiety.  · Certain medicines.  · Gastroesophageal reflux disease (GERD) or other gastrointestinal conditions.  · Asthma or other breathing conditions.  · Restless legs syndrome, sleep apnea, or other sleep disorders.  · Chronic pain.  · Menopause. This may include hot flashes.  · Stroke.  · Abuse of alcohol, tobacco, or illegal drugs.  · Depression.  · Caffeine.  · Neurological disorders, such as Alzheimer disease.  · An overactive thyroid (hyperthyroidism).    The cause of insomnia may not be known.  What increases the risk?  Risk factors for insomnia include:  · Gender. Women are more commonly affected than men.  · Age. Insomnia is more common as you get older.  · Stress. This may involve your professional or personal life.  · Income. Insomnia is more common in people with lower income.  · Lack of exercise.  · Irregular work schedule or night shifts.  · Traveling between different time zones.    What are the signs or symptoms?  If you have insomnia, trouble falling asleep or trouble staying asleep is the main symptom. This may lead to other symptoms, such as:  · Feeling fatigued.  · Feeling nervous about going to sleep.  · Not feeling rested in the morning.  · Having trouble concentrating.  · Feeling irritable, anxious, or depressed.    How is this treated?  Treatment for insomnia depends on the cause. If your insomnia is caused by an underlying condition, treatment will focus on addressing the condition. Treatment may also include:  · Medicines to help you sleep.  · Counseling or therapy.  · Lifestyle adjustments.    Follow these instructions at home:  · Take medicines only as directed by your health care provider.  · Keep regular sleeping and waking hours. Avoid naps.  · Keep a sleep diary to help you and your health care provider figure out what could be causing your insomnia. Include:  ? When you sleep.  ? When you wake up during the night.  ? How well you sleep.  ? How rested you feel  the next day.  ? Any side effects of medicines you are taking.  ? What you eat and drink.  · Make your bedroom a comfortable place where it is easy to fall asleep:  ? Put up shades or special blackout curtains to block light from outside.  ? Use a white noise machine to block noise.  ? Keep the temperature cool.  · Exercise regularly as directed by your health care provider. Avoid exercising right before bedtime.  · Use relaxation techniques to manage stress. Ask your health care provider to suggest some techniques that may work well for you. These may include:  ? Breathing exercises.  ? Routines to release muscle tension.  ? Visualizing peaceful scenes.  · Cut back on alcohol, caffeinated beverages, and cigarettes, especially close to bedtime. These can disrupt your sleep.  · Do not overeat or eat spicy foods right before bedtime. This can lead to digestive discomfort that can make it hard for you to   sleep.  · Limit screen use before bedtime. This includes:  ? Watching TV.  ? Using your smartphone, tablet, and computer.  · Stick to a routine. This can help you fall asleep faster. Try to do a quiet activity, brush your teeth, and go to bed at the same time each night.  · Get out of bed if you are still awake after 15 minutes of trying to sleep. Keep the lights down, but try reading or doing a quiet activity. When you feel sleepy, go back to bed.  · Make sure that you drive carefully. Avoid driving if you feel very sleepy.  · Keep all follow-up appointments as directed by your health care provider. This is important.  Contact a health care provider if:  · You are tired throughout the day or have trouble in your daily routine due to sleepiness.  · You continue to have sleep problems or your sleep problems get worse.  Get help right away if:  · You have serious thoughts about hurting yourself or someone else.  This information is not intended to replace advice given to you by your health care provider. Make sure you  discuss any questions you have with your health care provider.  Document Released: 08/09/2000 Document Revised: 01/12/2016 Document Reviewed: 05/13/2014  [employer] Interactive Patient Education © 2018 [employer]
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
## 2018-05-12 ENCOUNTER — Telehealth: Payer: Self-pay | Admitting: Registered Nurse
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
## 2018-05-28 ENCOUNTER — Ambulatory Visit: Payer: Self-pay | Admitting: Registered Nurse

## 2018-05-28 ENCOUNTER — Encounter: Payer: Self-pay | Admitting: Registered Nurse

## 2018-05-28 VITALS — BP 99/68 | HR 77 | Temp 98.0°F

## 2018-05-28 DIAGNOSIS — J0101 Acute recurrent maxillary sinusitis: Secondary | ICD-10-CM

## 2018-05-28 DIAGNOSIS — J209 Acute bronchitis, unspecified: Secondary | ICD-10-CM

## 2018-05-28 DIAGNOSIS — H6692 Otitis media, unspecified, left ear: Secondary | ICD-10-CM

## 2018-05-28 MED ORDER — AMOXICILLIN-POT CLAVULANATE 875-125 MG PO TABS: 1.0000 | ORAL_TABLET | Freq: Two times a day (BID) | ORAL | 0 refills | Status: AC

## 2018-05-28 MED ORDER — PREDNISONE 10 MG (21) PO TBPK: ORAL_TABLET | ORAL | 0 refills | Status: DC

## 2018-05-28 NOTE — Patient Instructions (Signed)
Nonallergic Rhinitis Nonallergic rhinitis is a condition that causes symptoms that affect the nose, such as a runny nose and a stuffed-up nose (nasal congestion) that can make it hard to breathe through the nose. This condition is different from having an allergy (allergic rhinitis). Allergic rhinitis occurs when the body's defense system (immune system) reacts to a substance that you are allergic to (allergen), such as pollen, pet dander, mold, or dust. Nonallergic rhinitis has many similar symptoms, but it is not caused by allergens. Nonallergic rhinitis can be a short-term or long-term problem. What are the causes? This condition can be caused by many different things. Some common types of nonallergic rhinitis include: Infectious rhinitis  This is usually due to an infection in the upper respiratory tract. Vasomotor rhinitis  This is the most common type of long-term nonallergic rhinitis.  It is caused by too much blood flow through the nose, which makes the tissue inside of the nose swell.  Symptoms are often triggered by strong odors, cold air, stress, drinking alcohol, cigarette smoke, or changes in the weather. Occupational rhinitis  This type is caused by triggers in the workplace, such as chemicals, dusts, animal dander, or air pollution. Hormonal rhinitis  This type occurs in women as a result of an increase in the female hormone estrogen.  It may occur during pregnancy, puberty, and menstrual cycles.  Symptoms improve when estrogen levels drop. Drug-induced rhinitis Several drugs can cause nonallergic rhinitis, including:  Medicines that are used to treat high blood pressure, heart disease, and Parkinson disease.  Aspirin and NSAIDs.  Over-the-counter nasal decongestant sprays. These can cause a type of nonallergic rhinitis (rhinitis medicamentosa) when they are used for more than a few days.  Nonallergic rhinitis with eosinophilia syndrome (NARES)  This type is caused  by having too much of a certain type of white blood cell (eosinophil). Nonallergic rhinitis can also be caused by a reaction to eating hot or spicy foods. This does not usually cause long-term symptoms. In some cases, the cause of nonallergic rhinitis is not known. What increases the risk? You are more likely to develop this condition if:  You are 30-60 years of age.  You are a woman. Women are twice as likely to have this condition.  What are the signs or symptoms? Common symptoms of this condition include:  Nasal congestion.  Runny nose.  The feeling of mucus going down the back of the throat (postnasal drip).  Trouble sleeping at night and daytime sleepiness.  Less common symptoms include:  Sneezing.  Coughing.  Itchy nose.  Bloodshot eyes.  How is this diagnosed? This condition may be diagnosed based on:  Your symptoms and medical history.  A physical exam.  Allergy testing to rule out allergic rhinitis. You may have skin tests or blood tests.  In some cases, the health care provider may take a swab of nasal secretions to look for an increased number of eosinophils. This would be done to confirm a diagnosis of NARES. How is this treated? Treatment for this condition depends on the cause. No single treatment works for everyone. Work with your health care provider to find the best treatment for you. Treatment may include:  Avoiding the things that trigger your symptoms.  Using medicines to relieve congestion, such as: ? Steroid nasal spray. There are many types. You may need to try a few to find out which one works best. ? Decongestant medicine. This may be an oral medicine or a nasal spray. These   medicines are only used for a short time.  Using medicines to relieve a runny nose. These may include antihistamine medicines or anticholinergic nasal sprays.  Surgery to remove tissue from inside the nose may be needed in severe cases if the condition has not improved  after 6-12 months of medical treatment. Follow these instructions at home:  Take or use over-the-counter and prescription medicines only as told by your health care provider. Do not stop using your medicine even if you start to feel better.  Use salt-water (saline) rinses or other solutions (nasal washes or irrigations) to wash or rinse out the inside of your nose as told by your health care provider.  Do not take NSAIDs or medicines that contain aspirin if they make your symptoms worse.  Do not drink alcohol if it makes your symptoms worse.  Do not use any tobacco products, such as cigarettes, chewing tobacco, and e-cigarettes. If you need help quitting, ask your health care provider.  Avoid secondhand smoke.  Get some exercise every day. Exercise may help reduce symptoms of nonallergic rhinitis for some people. Ask your health care provider how much exercise and what types of exercise are safe for you.  Sleep with the head of your bed raised (elevated). This may reduce nighttime nasal congestion.  Keep all follow-up visits as told by your health care provider. This is important. Contact a health care provider if:  You have a fever.  Your symptoms are getting worse at home.  Your symptoms are not responding to medicine.  You develop new symptoms, especially a headache or nosebleed. This information is not intended to replace advice given to you by your health care provider. Make sure you discuss any questions you have with your health care provider. Document Released: 12/04/2015 Document Revised: 01/18/2016 Document Reviewed: 11/02/2015 Elsevier Interactive Patient Education  2018 [employer]. Sinusitis, Adult Sinusitis is soreness and inflammation of your sinuses. Sinuses are hollow spaces in the bones around your face. Your sinuses are located:  Around your eyes.  In the middle of your forehead.  Behind your nose.  In your cheekbones.  Your sinuses and nasal passages  are lined with a stringy fluid (mucus). Mucus normally drains out of your sinuses. When your nasal tissues become inflamed or swollen, the mucus can become trapped or blocked so air cannot flow through your sinuses. This allows bacteria, viruses, and funguses to grow, which leads to infection. Sinusitis can develop quickly and last for 7?10 days (acute) or for more than 12 weeks (chronic). Sinusitis often develops after a cold. What are the causes? This condition is caused by anything that creates swelling in the sinuses or stops mucus from draining, including:  Allergies.  Asthma.  Bacterial or viral infection.  Abnormally shaped bones between the nasal passages.  Nasal growths that contain mucus (nasal polyps).  Narrow sinus openings.  Pollutants, such as chemicals or irritants in the air.  A foreign object stuck in the nose.  A fungal infection. This is rare.  What increases the risk? The following factors may make you more likely to develop this condition:  Having allergies or asthma.  Having had a recent cold or respiratory tract infection.  Having structural deformities or blockages in your nose or sinuses.  Having a weak immune system.  Doing a lot of swimming or diving.  Overusing nasal sprays.  Smoking.  What are the signs or symptoms? The main symptoms of this condition are pain and a feeling of pressure around  the affected sinuses. Other symptoms include:  Upper toothache.  Earache.  Headache.  Bad breath.  Decreased sense of smell and taste.  A cough that may get worse at night.  Fatigue.  Fever.  Thick drainage from your nose. The drainage is often green and it may contain pus (purulent).  Stuffy nose or congestion.  Postnasal drip. This is when extra mucus collects in the throat or back of the nose.  Swelling and warmth over the affected sinuses.  Sore throat.  Sensitivity to light.  How is this diagnosed? This condition is  diagnosed based on symptoms, a medical history, and a physical exam. To find out if your condition is acute or chronic, your health care provider may:  Look in your nose for signs of nasal polyps.  Tap over the affected sinus to check for signs of infection.  View the inside of your sinuses using an imaging device that has a light attached (endoscope).  If your health care provider suspects that you have chronic sinusitis, you may also:  Be tested for allergies.  Have a sample of mucus taken from your nose (nasal culture) and checked for bacteria.  Have a mucus sample examined to see if your sinusitis is related to an allergy.  If your sinusitis does not respond to treatment and it lasts longer than 8 weeks, you may have an MRI or CT scan to check your sinuses. These scans also help to determine how severe your infection is. In rare cases, a bone biopsy may be done to rule out more serious types of fungal sinus disease. How is this treated? Treatment for sinusitis depends on the cause and whether your condition is chronic or acute. If a virus is causing your sinusitis, your symptoms will go away on their own within 10 days. You may be given medicines to relieve your symptoms, including:  Topical nasal decongestants. They shrink swollen nasal passages and let mucus drain from your sinuses.  Antihistamines. These drugs block inflammation that is triggered by allergies. This can help to ease swelling in your nose and sinuses.  Topical nasal corticosteroids. These are nasal sprays that ease inflammation and swelling in your nose and sinuses.  Nasal saline washes. These rinses can help to get rid of thick mucus in your nose.  If your condition is caused by bacteria, you will be given an antibiotic medicine. If your condition is caused by a fungus, you will be given an antifungal medicine. Surgery may be needed to correct underlying conditions, such as narrow nasal passages. Surgery may also  be needed to remove polyps. Follow these instructions at home: Medicines  Take, use, or apply over-the-counter and prescription medicines only as told by your health care provider. These may include nasal sprays.  If you were prescribed an antibiotic medicine, take it as told by your health care provider. Do not stop taking the antibiotic even if you start to feel better. Hydrate and Humidify  Drink enough water to keep your urine clear or pale yellow. Staying hydrated will help to thin your mucus.  Use a cool mist humidifier to keep the humidity level in your home above 50%.  Inhale steam for 10-15 minutes, 3-4 times a day or as told by your health care provider. You can do this in the bathroom while a hot shower is running.  Limit your exposure to cool or dry air. Rest  Rest as much as possible.  Sleep with your head raised (elevated).  Make sure  to get enough sleep each night. General instructions  Apply a warm, moist washcloth to your face 3-4 times a day or as told by your health care provider. This will help with discomfort.  Wash your hands often with soap and water to reduce your exposure to viruses and other germs. If soap and water are not available, use hand sanitizer.  Do not smoke. Avoid being around people who are smoking (secondhand smoke).  Keep all follow-up visits as told by your health care provider. This is important. Contact a health care provider if:  You have a fever.  Your symptoms get worse.  Your symptoms do not improve within 10 days. Get help right away if:  You have a severe headache.  You have persistent vomiting.  You have pain or swelling around your face or eyes.  You have vision problems.  You develop confusion.  Your neck is stiff.  You have trouble breathing. This information is not intended to replace advice given to you by your health care provider. Make sure you discuss any questions you have with your health care  provider. Document Released: 08/12/2005 Document Revised: 04/07/2016 Document Reviewed: 06/07/2015 Elsevier Interactive Patient Education  2018 [employer] Sinus Rinse What is a sinus rinse? A sinus rinse is a simple home treatment that is used to rinse your sinuses with a sterile mixture of salt and water (saline solution). Sinuses are air-filled spaces in your skull behind the bones of your face and forehead that open into your nasal cavity. You will use the following:  Saline solution.  Neti pot or spray bottle. This releases the saline solution into your nose and through your sinuses. Neti pots and spray bottles can be purchased at [employer], a health food store, or online.  When would I do a sinus rinse? A sinus rinse can help to clear mucus, dirt, dust, or pollen from the nasal cavity. You may do a sinus rinse when you have a cold, a virus, nasal allergy symptoms, a sinus infection, or stuffiness in the nose or sinuses. If you are considering a sinus rinse:  Ask your child's health care provider before performing a sinus rinse on your child.  Do not do a sinus rinse if you have had ear or nasal surgery, ear infection, or blocked ears.  How do I do a sinus rinse?  Wash your hands.  Disinfect your device according to the directions provided and then dry it.  Use the solution that comes with your device or one that is sold separately in stores. Follow the mixing directions on the package.  Fill your device with the amount of saline solution as directed by the device instructions.  Stand over a sink and tilt your head sideways over the sink.  Place the spout of the device in your upper nostril (the one closer to the ceiling).  Gently pour or squeeze the saline solution into the nasal cavity. The liquid should drain to the lower nostril if you are not overly congested.  Gently blow your nose. Blowing too hard may cause ear pain.  Repeat in the other  nostril.  Clean and rinse your device with clean water and then air-dry it. Are there risks of a sinus rinse? Sinus rinse is generally very safe and effective. However, there are a few risks, which include:  A burning sensation in the sinuses. This may happen if you do not make the saline solution as directed. Make sure to follow all directions when  making the saline solution.  Infection from contaminated water. This is rare, but possible.  Nasal irritation.  This information is not intended to replace advice given to you by your health care provider. Make sure you discuss any questions you have with your health care provider. Document Released: 03/09/2014 Document Revised: 07/09/2016 Document Reviewed: 12/28/2013 Elsevier Interactive Patient Education  2017 [employer] Acute Bronchitis, Adult Acute bronchitis is when air tubes (bronchi) in the lungs suddenly get swollen. The condition can make it hard to breathe. It can also cause these symptoms:  A cough.  Coughing up clear, yellow, or green mucus.  Wheezing.  Chest congestion.  Shortness of breath.  A fever.  Body aches.  Chills.  A sore throat.  Follow these instructions at home: Medicines  Take over-the-counter and prescription medicines only as told by your doctor.  If you were prescribed an antibiotic medicine, take it as told by your doctor. Do not stop taking the antibiotic even if you start to feel better. General instructions  Rest.  Drink enough fluids to keep your pee (urine) clear or pale yellow.  Avoid smoking and secondhand smoke. If you smoke and you need help quitting, ask your doctor. Quitting will help your lungs heal faster.  Use an inhaler, cool mist vaporizer, or humidifier as told by your doctor.  Keep all follow-up visits as told by your doctor. This is important. How is this prevented? To lower your risk of getting this condition again:  Wash your hands often with soap and water. If  you cannot use soap and water, use hand sanitizer.  Avoid contact with people who have cold symptoms.  Try not to touch your hands to your mouth, nose, or eyes.  Make sure to get the flu shot every year.  Contact a doctor if:  Your symptoms do not get better in 2 weeks. Get help right away if:  You cough up blood.  You have chest pain.  You have very bad shortness of breath.  You become dehydrated.  You faint (pass out) or keep feeling like you are going to pass out.  You keep throwing up (vomiting).  You have a very bad headache.  Your fever or chills gets worse. This information is not intended to replace advice given to you by your health care provider. Make sure you discuss any questions you have with your health care provider. Document Released: 01/29/2008 Document Revised: 03/20/2016 Document Reviewed: 01/31/2016 Elsevier Interactive Patient Education  2018 [employer]. Otitis Media, Adult Otitis media occurs when there is inflammation and fluid in the middle ear. Your middle ear is a part of the ear that contains bones for hearing as well as air that helps send sounds to your brain. What are the causes? This condition is caused by a blockage in the eustachian tube. This tube drains fluid from the ear to the back of the nose (nasopharynx). A blockage in this tube can be caused by an object or by swelling (edema) in the tube. Problems that can cause a blockage include:  A cold or other upper respiratory infection.  Allergies.  An irritant, such as tobacco smoke.  Enlarged adenoids. The adenoids are areas of soft tissue located high in the back of the throat, behind the nose and the roof of the mouth.  A mass in the nasopharynx.  Damage to the ear caused by pressure changes (barotrauma).  What are the signs or symptoms? Symptoms of this condition include:  Ear pain.  A fever.  Decreased hearing.  A headache.  Tiredness (lethargy).  Fluid leaking from  the ear.  Ringing in the ear.  How is this diagnosed? This condition is diagnosed with a physical exam. During the exam your health care provider will use an instrument called an otoscope to look into your ear and check for redness, swelling, and fluid. He or she will also ask about your symptoms. Your health care provider may also order tests, such as:  A test to check the movement of the eardrum (pneumatic otoscopy). This test is done by squeezing a small amount of air into the ear.  A test that changes air pressure in the middle ear to check how well the eardrum moves and whether the eustachian tube is working (tympanogram).  How is this treated? This condition usually goes away on its own within 3-5 days. But if the condition is caused by a bacteria infection and does not go away own its own, or keeps coming back, your health care provider may:  Prescribe antibiotic medicines to treat the infection.  Prescribe or recommend medicines to control pain.  Follow these instructions at home:  Take over-the-counter and prescription medicines only as told by your health care provider.  If you were prescribed an antibiotic medicine, take it as told by your health care provider. Do not stop taking the antibiotic even if you start to feel better.  Keep all follow-up visits as told by your health care provider. This is important. Contact a health care provider if:  You have bleeding from your nose.  There is a lump on your neck.  You are not getting better in 5 days.  You feel worse instead of better. Get help right away if:  You have severe pain that is not controlled with medicine.  You have swelling, redness, or pain around your ear.  You have stiffness in your neck.  A part of your face is paralyzed.  The bone behind your ear (mastoid) is tender when you touch it.  You develop a severe headache. Summary  Otitis media is redness, soreness, and swelling of the middle  ear.  This condition usually goes away on its own within 3-5 days.  If the problem does not go away in 3-5 days, your health care provider may prescribe or recommend medicines to treat your symptoms.  If you were prescribed an antibiotic medicine, take it as told by your health care provider. This information is not intended to replace advice given to you by your health care provider. Make sure you discuss any questions you have with your health care provider. Document Released: 05/17/2004 Document Revised: 08/02/2016 Document Reviewed: 08/02/2016 Elsevier Interactive Patient Education  [employer].

## 2018-05-28 NOTE — Progress Notes (Signed)
Subjective:    Patient ID: Rebecca Sweeney, female    DOB: 1961-04-17, 57 y.o.   MRN: [id number]  57y/o Caucasian female pt c/o productive cough, R sided frontal, maxillary sinus pain/ pressure, pressure behind R eye, slight rhinorrhea. Denies otalgia, sore throat, nasal congestion. Using Advil at home. Last dose last pm. No other OTCs. Has flonase and nasal saline at home but not using.  + sick contacts at work.  Sleeping okay.  Home concerns slowly being resolved with police post robbery second car found last night by police and returned to them with minor damage but they need to obtain new vehicle key.     Review of Systems  Constitutional: Negative for activity change, appetite change, chills, diaphoresis, fatigue, fever and unexpected weight change.  HENT: Positive for postnasal drip, rhinorrhea, sinus pressure and sinus pain. Negative for congestion, dental problem, drooling, ear discharge, ear pain, facial swelling, hearing loss, mouth sores, nosebleeds, sneezing, sore throat, tinnitus, trouble swallowing and voice change.   Eyes: Negative for photophobia, pain, discharge, redness, itching and visual disturbance.  Respiratory: Positive for cough. Negative for choking, chest tightness, shortness of breath, wheezing and stridor.   Cardiovascular: Negative for chest pain, palpitations and leg swelling.  Gastrointestinal: Negative for abdominal distention, abdominal pain, blood in stool, constipation, diarrhea, nausea and vomiting.  Endocrine: Negative for cold intolerance and heat intolerance.  Genitourinary: Negative for difficulty urinating, dysuria and hematuria.  Musculoskeletal: Negative for arthralgias, back pain, gait problem, joint swelling, myalgias, neck pain and neck stiffness.  Skin: Negative for color change, pallor, rash and wound.  Allergic/Immunologic: Positive for environmental allergies. Negative for food allergies.  Neurological: Positive for headaches. Negative for  dizziness, tremors, seizures, syncope, facial asymmetry, speech difficulty, weakness, light-headedness and numbness.  Hematological: Negative for adenopathy. Does not bruise/bleed easily.  Psychiatric/Behavioral: Negative for agitation, behavioral problems, confusion and sleep disturbance. The patient is not nervous/anxious.        Objective:   Physical Exam  Constitutional: She is oriented to person, place, and time. Vital signs are normal. She appears well-developed and well-nourished. She is active and cooperative.  Non-toxic appearance. She does not have a sickly appearance. She appears ill. No distress.  HENT:  Head: Normocephalic and atraumatic.  Right Ear: Hearing, external ear and ear canal normal. A middle ear effusion is present.  Left Ear: Hearing, external ear and ear canal normal. Tympanic membrane is erythematous and bulging. A middle ear effusion is present.  Nose: Mucosal edema and rhinorrhea present. No nose lacerations, sinus tenderness, nasal deformity, septal deviation or nasal septal hematoma. No epistaxis.  No foreign bodies. Right sinus exhibits maxillary sinus tenderness. Right sinus exhibits no frontal sinus tenderness. Left sinus exhibits no maxillary sinus tenderness and no frontal sinus tenderness.  Mouth/Throat: Uvula is midline and mucous membranes are normal. Mucous membranes are not pale, not dry and not cyanotic. She does not have dentures. No oral lesions. No trismus in the jaw. Normal dentition. No dental abscesses, uvula swelling, lacerations or dental caries. Posterior oropharyngeal edema and posterior oropharyngeal erythema present. No oropharyngeal exudate or tonsillar abscesses. No tonsillar exudate.  Right maxillary sinus ttp; bilateral allergic shiners; yellow discharge bilateral nasal turbinates edema/erythema; bilateral TMs air fluid level clear; left TM injection bulging centrally to peripheral in star pattern; cobblestoning posterior pharynx  Eyes: Pupils  are equal, round, and reactive to light. Conjunctivae, EOM and lids are normal. Right eye exhibits no chemosis, no discharge, no exudate and no hordeolum. No foreign body  present in the right eye. Left eye exhibits no chemosis, no discharge, no exudate and no hordeolum. No foreign body present in the left eye. Right conjunctiva is not injected. Right conjunctiva has no hemorrhage. Left conjunctiva is not injected. Left conjunctiva has no hemorrhage. No scleral icterus. Right eye exhibits normal extraocular motion and no nystagmus. Left eye exhibits normal extraocular motion and no nystagmus. Right pupil is round and reactive. Left pupil is round and reactive. Pupils are equal.  Neck: Trachea normal, normal range of motion and phonation normal. Neck supple. No tracheal tenderness, no spinous process tenderness and no muscular tenderness present. No neck rigidity. No tracheal deviation, no edema, no erythema and normal range of motion present. No thyroid mass and no thyromegaly present.  Cardiovascular: Normal rate, regular rhythm, S1 normal, S2 normal, normal heart sounds and intact distal pulses. PMI is not displaced. Exam reveals no gallop and no friction rub.  No murmur heard. Pulmonary/Chest: Effort normal and breath sounds normal. No accessory muscle usage or stridor. No respiratory distress. She has no decreased breath sounds. She has no wheezes. She has no rhonchi. She has no rales. She exhibits no tenderness.  Mild nonproductive cough intermittent observed in exam room; spoke full sentences without difficulty  Abdominal: Soft. Normal appearance. She exhibits no distension, no fluid wave and no ascites. There is no rigidity and no guarding.  Musculoskeletal: Normal range of motion. She exhibits no edema or tenderness.       Right shoulder: Normal.       Left shoulder: Normal.       Right elbow: Normal.      Left elbow: Normal.       Right hip: Normal.       Left hip: Normal.       Right knee:  Normal.       Left knee: Normal.       Cervical back: Normal.       Thoracic back: Normal.       Lumbar back: Normal.       Right hand: Normal.       Left hand: Normal.  Lymphadenopathy:       Head (right side): No submental, no submandibular, no tonsillar, no preauricular, no posterior auricular and no occipital adenopathy present.       Head (left side): No submental, no submandibular, no tonsillar, no preauricular, no posterior auricular and no occipital adenopathy present.    She has no cervical adenopathy.       Right cervical: No superficial cervical, no deep cervical and no posterior cervical adenopathy present.      Left cervical: No superficial cervical, no deep cervical and no posterior cervical adenopathy present.  Neurological: She is alert and oriented to person, place, and time. She has normal strength. She is not disoriented. She displays no atrophy and no tremor. No cranial nerve deficit or sensory deficit. She exhibits normal muscle tone. She displays no seizure activity. Coordination and gait normal. GCS eye subscore is 4. GCS verbal subscore is 5. GCS motor subscore is 6.  In/out of chair and on/off exam table without difficulty; gait sure and steady in hallway  Skin: Skin is warm, dry and intact. Capillary refill takes less than 2 seconds. No abrasion, no bruising, no burn, no ecchymosis, no laceration, no lesion, no petechiae and no rash noted. She is not diaphoretic. No cyanosis or erythema. No pallor. Nails show no clubbing.  Psychiatric: She has a normal mood and affect. Her  speech is normal and behavior is normal. Judgment and thought content normal. She is not actively hallucinating. Cognition and memory are normal.  Much calmer not anxious discussing home robbery; eye contact steady and hygiene good She is attentive.  Nursing note and vitals reviewed.         Assessment & Plan:  A-left acute otitis media, recurrent acute maxillary sinusitis, acute  bronchitis  P-Supportive treatment.  augmentin 875mg  po BID x 10 days #20 RF0 dispensed from [facility] to patient.  Tylenol 1000mg  po Qid prn pain OTC  No evidence of invasive bacterial infection, non toxic and well hydrated.  This is most likely self limiting viral infection.  I do not see where any further testing or imaging is necessary at this time.   I will suggest supportive care, rest, good hygiene and encourage the patient to take adequate fluids.  The patient is to return to clinic or EMERGENCY ROOM if symptoms worsen or change significantly e.g. ear pain, fever, purulent discharge from ears or bleeding.  Exitcare handout on otitis media.  Patient verbalized agreement and understanding of treatment plan.    Phenylephrine 10mg  po QID prn rhinitis 4 UD given from clinic stock to patient see RN Rolly Salter tomorrow if she wants refill.  Restart flonase 1 spray each nostril BID, saline 2 sprays each nostril q2h wa prn congestion at home.  If no improvement with 48 hours of saline and flonase use start augmentin 875mg  po BID x 10 days #20 RF0 dispensed from [facility] to patient.  Denied personal or family history of ENT cancer.  Shower BID especially prior to bed. No evidence of systemic bacterial infection, non toxic and well hydrated.  I do not see where any further testing or imaging is necessary at this time.   I will suggest supportive care, rest, good hygiene and encourage the patient to take adequate fluids.  The patient is to return to clinic or EMERGENCY ROOM if symptoms worsen or change significantly.  Exitcare handout on sinusitis and sinus rinse given to patient.  Patient verbalized agreement and understanding of treatment plan and had no further questions at this time.   P2:  Hand washing and cover cough  Patient may use normal saline nasal spray 2 sprays each nostril q2h wa as needed. flonase 1 spray each nostril BID  Patient denied personal or family history of ENT cancer.  OTC antihistamine of  choice claritin 10mg  po daily.  Avoid triggers if possible.  Shower prior to bedtime if exposed to triggers.  If allergic dust/dust mites recommend mattress/pillow covers/encasements; washing linens, vacuuming, sweeping, dusting weekly.  Call or return to clinic as needed if these symptoms worsen or fail to improve as anticipated.   Exitcare handout on sinus rinse given to patient.  Patient verbalized understanding of instructions, agreed with plan of care and had no further questions at this time.   Decrease smoking cigarettes consider stopping. Cough lozenges po q2h prn cough given 8 UD from clinic stock.  Prednisone taper 10mg  (60/50/40/30/20/10mg ) po daily with breakfast #21 RF0 dispensed from [facility].  Discussed possible side effects increased/decreased appetite, difficulty sleeping, increased blood sugar, increased blood pressure and heart rate.  Albuterol MDI 1-2 puffs po q4-6h prn protracted cough/wheeze  At home possible side effect increased heart rate. Bronchitis simple, community acquired, may have started as viral (probably respiratory syncytial, parainfluenza, influenza, or adenovirus), but now evidence of acute purulent bronchitis with resultant bronchial edema and mucus formation.  Viruses are the  most common cause of bronchial inflammation in otherwise healthy adults with acute bronchitis.  The appearance of sputum is not predictive of whether a bacterial infection is present.  Purulent sputum is most often caused by viral infections.  There are a small portion of those caused by non-viral agents being Mycoplama pneumonia.  Microscopic examination or C&S of sputum in the healthy adult with acute bronchitis is generally not helpful (usually negative or normal respiratory flora) other considerations being cough from upper respiratory tract infections, sinusitis or allergic syndromes (mild asthma or viral pneumonia).  Differential Diagnoses:  reactive airway disease (asthma, allergic aspergillosis  (eosinophilia), chronic bronchitis, respiratory infection (sinusitis, common cold, pneumonia), congestive heart failure, reflux esophagitis, bronchogenic tumor, aspiration syndromes and/or exposure to pulmonary irritants/smoke  Without high fever, severe dyspnea, lack of physical findings or other risk factors, I will hold on a chest radiograph and CBC at this time.  I discussed that approximately 50% of patients with acute bronchitis have a cough that lasts up to three weeks, and 25% for over a month.  Tylenol 500mg  one to two tablets every four to six hours as needed for fever or myalgias.  No aspirin. Exitcare handout on bronchitis and inhaler use given to patient.  ER if hemopthysis, SOB, worst chest pain of life.   Patient instructed to follow up in one week or sooner if symptoms worsen.  Patient verbalized agreement and understanding of treatment plan.  P2:  hand washing and cover cough P2:  Avoidance and hand washing.

## 2018-08-04 ENCOUNTER — Ambulatory Visit: Payer: Self-pay | Admitting: Registered Nurse

## 2018-08-04 VITALS — BP 112/72 | HR 77 | Temp 97.3°F

## 2018-08-04 DIAGNOSIS — M25561 Pain in right knee: Secondary | ICD-10-CM

## 2018-08-04 MED ORDER — IBUPROFEN 800 MG PO TABS: 800.0000 mg | ORAL_TABLET | Freq: Three times a day (TID) | ORAL | 0 refills | Status: AC | PRN

## 2018-08-04 NOTE — Progress Notes (Signed)
Subjective:    Patient ID: Rebecca StallSonja Sandquist, female    DOB: 1960-11-24, 57 y.o.   MRN: [id number]  57y/o Caucasian established female pt c/o R knee pain x1.5 weeks. Has used Ibuprofen at home with minimal relief and ran out needs refill. Reports pain worsened over the last 2-3 days when her activity has been increased.      Review of Systems  Constitutional: Negative for activity change, appetite change, chills, diaphoresis, fatigue and fever.  HENT: Negative for trouble swallowing and voice change.   Eyes: Negative for photophobia and visual disturbance.  Respiratory: Negative for cough, shortness of breath, wheezing and stridor.   Cardiovascular: Positive for leg swelling.  Gastrointestinal: Negative for diarrhea, nausea and vomiting.  Endocrine: Negative for cold intolerance and heat intolerance.  Musculoskeletal: Positive for arthralgias and myalgias. Negative for back pain, gait problem, joint swelling, neck pain and neck stiffness.  Skin: Negative for color change, pallor, rash and wound.  Allergic/Immunologic: Positive for environmental allergies. Negative for food allergies.  Neurological: Negative for tremors, weakness and numbness.  Hematological: Negative for adenopathy. Does not bruise/bleed easily.  Psychiatric/Behavioral: Negative for agitation and confusion.       Objective:   Physical Exam  Constitutional: She is oriented to person, place, and time. Vital signs are normal. She appears well-developed and well-nourished. She is active and cooperative.  Non-toxic appearance. She does not have a sickly appearance. She does not appear ill. No distress.  HENT:  Head: Normocephalic and atraumatic.  Right Ear: Hearing and external ear normal.  Left Ear: Hearing and external ear normal.  Nose: Nose normal.  Mouth/Throat: Uvula is midline, oropharynx is clear and moist and mucous membranes are normal. No oropharyngeal exudate.  Eyes: Pupils are equal, round, and reactive to  light. Conjunctivae, EOM and lids are normal. Right eye exhibits no discharge. Left eye exhibits no discharge. No scleral icterus.  Neck: Trachea normal, normal range of motion and phonation normal. Neck supple. No neck rigidity. No tracheal deviation, no edema, no erythema and normal range of motion present.  Cardiovascular: Normal rate, regular rhythm, normal heart sounds and intact distal pulses.  Pulses:      Radial pulses are 2+ on the right side, and 2+ on the left side.  Cap refill less than 2 sec bilateral lower extremities  Pulmonary/Chest: Effort normal and breath sounds normal. No stridor. No respiratory distress. She has no decreased breath sounds. She has no wheezes. She has no rhonchi. She has no rales.  Abdominal: Soft. Normal appearance. She exhibits no distension, no fluid wave and no ascites. There is no rigidity and no guarding.  Musculoskeletal: Normal range of motion. She exhibits no edema, tenderness or deformity.       Right shoulder: Normal.       Left shoulder: Normal.       Right elbow: Normal.      Left elbow: Normal.       Right hip: Normal.       Left hip: Normal.       Right knee: She exhibits normal range of motion, no ecchymosis, no deformity, no laceration, no erythema, normal alignment and normal patellar mobility.       Left knee: Normal.       Cervical back: Normal.       Thoracic back: Normal.       Lumbar back: Normal.       Right hand: Normal.       Left hand: Normal.  Legs: Lymphadenopathy:       Head (right side): No submental adenopathy present.       Head (left side): No submental adenopathy present.    She has no cervical adenopathy.       Right cervical: No superficial cervical adenopathy present.      Left cervical: No superficial cervical adenopathy present.  Neurological: She is alert and oriented to person, place, and time. She has normal strength. She is not disoriented. She displays no atrophy and no tremor. No cranial nerve deficit  or sensory deficit. She exhibits normal muscle tone. She displays no seizure activity. Coordination and gait normal. GCS eye subscore is 4. GCS verbal subscore is 5. GCS motor subscore is 6.  Gait sure and steady in hallway; strength equal extremities bilaterally 5/5; in/out of chair without difficulty  Skin: Skin is warm, dry and intact. Capillary refill takes less than 2 seconds. No abrasion, no bruising, no burn, no ecchymosis, no laceration, no lesion, no petechiae and no rash noted. She is not diaphoretic. No cyanosis or erythema. No pallor. Nails show no clubbing.  Psychiatric: She has a normal mood and affect. Her speech is normal and behavior is normal. Judgment and thought content normal. Cognition and memory are normal.  Nursing note and vitals reviewed.  Fitted and distributed neoprene right knee sleeve size medium from clinic stock to patient and assisted with application        Assessment & Plan:  A-right acute knee pain  P-refilled motrin 800mg  po TID prn pain take with food #30 RF0 dispensed from [facility].  DDx prepatellar bursitis, patellar tendonitis, knee strain  Has had increased work flow due to holiday demands bending and lifting.  Denied known injury/trauma. Wear neoprene sleeve except to shower and in bed.  Avoid high impact activities e.g. Running/jumping.  Cryotherapy 15 minutes TID, NSAID daily, elevate leg when at home and on break.  Ensure shoes not worn out.  Can also trial biofreeze gel 2% topical QID prn pain.   Follow up in 2 weeks for re-evaluation sooner if worsening symptoms.  Exitcare handout knee sprain, patellar tendonitis and bursitis.  Patient verbalized understanding information/instructions, agreed with plan of care and had no further questions at this time.

## 2018-08-04 NOTE — Patient Instructions (Addendum)
Knee Sprain, Adult A knee sprain is a stretch or tear in a knee ligament. Knee ligaments are bands of tissue that connect bones in the knee to each other. What are the causes? This condition often results from:  A fall.  An injury to the knee.  What are the signs or symptoms? Symptoms of this condition include:  Trouble bending the leg.  Swelling in the knee.  Bruising around the knee.  Tenderness or pain in the knee.  Muscle spasms around the knee.  How is this diagnosed? This condition may be diagnosed based on:  A physical exam.  What happened just before you started to have symptoms.  Tests, including: ? An X-ray. This may be done to make sure no bones are broken. ? An MRI. This may be done to check if the ligament is torn. ? Stress testing of the knee. This may be done to check ligament damage.  How is this treated? Treatment for this condition may involve:  Keeping the knee still (immobilized) with a cast, brace, or splint.  Applying ice to the knee. This helps with pain and swelling.  Keeping the knee raised (elevated) above the level of your heart when you are resting. This helps with pain and swelling.  Taking medicine for pain.  Exercises to prevent or limit permanent weakness or stiffness in your knee.  Surgery to reconnect the ligament to the bone or to reconstruct it. This may be needed if the ligament tore all the way.  Follow these instructions at home: If you have a splint or brace:  Wear the splint or brace as told by your health care provider. Remove it only as told by your health care provider.  Loosen the splint or brace if your toes tingle, become numb, or turn cold and blue.  Keep the splint or brace clean.  If the splint or brace is not waterproof: ? Do not let it get wet. ? Cover it with a watertight covering when you take a bath or a shower. If you have a cast:  Do not stick anything inside the cast to scratch your skin. Doing  that increases your risk of infection.  Check the skin around the cast every day. Tell your health care provider about any concerns.  You may put lotion on dry skin around the edges of the cast. Do not put lotion on the skin underneath the cast.  Keep the cast clean.  If the cast is not waterproof: ? Do not let it get wet. ? Cover it with a watertight covering when you take a bath or a shower. Managing pain, stiffness, and swelling   If directed, put ice on the injured area. ? If you have a removable splint or brace, remove it as told by your health care provider. ? Put ice in a plastic bag. ? Place a towel between your skin and the bag or between your cast and the bag. ? Leave the ice on for 20 minutes, 2-3 times a day.  Gently move your toes often to avoid stiffness and to lessen swelling.  Elevate the injured area above the level of your heart while you are sitting or lying down.  Take over-the-counter and prescription medicines only as told by your health care provider. General instructions  Do exercises as told by your health care provider.  Keep all follow-up visits as told by your health care provider. This is important. Contact a health care provider if:  You have pain   that gets worse.  The cast, brace, or splint does not fit right.  The cast, brace, or splint gets damaged. Get help right away if:  You cannot use your injured joint to support any of your body weight (cannot bear weight).  You cannot move the injured joint.  You cannot walk more than a few steps without pain or without your knee buckling.  You have significant pain, swelling, or numbness below the cast, brace, or splint. This information is not intended to replace advice given to you by your health care provider. Make sure you discuss any questions you have with your health care provider. Document Released: 08/12/2005 Document Revised: 05/01/2016 Document Reviewed: 03/01/2016 Elsevier  Interactive Patient Education  2018 [employer]  Patellar Tendinitis Patellar tendinitis, also called jumper's knee, is inflammation of the patellar tendon. Tendons are cord-like tissues that connect muscles to bones. The patellar tendon connects the bottom of the kneecap (patella) to the top of the shin bone (tibia). Patellar tendinitis causes pain in the front of the knee. The condition happens in the following stages:  Stage 1. In this stage, there is pain only after activity.  Stage 2: In this stage, you have pain during and after activity.  Stage 3: In this stage, you have pain during and after activity that limits your ability to do the activity.  Stage 4: In this stage, the tendon tears and severely limits your activity.  What are the causes? This condition is caused by repeated (repetitive) stress on the tendon. This stress may cause the tendon to stretch, swell, thicken, or tear. What increases the risk? The following factors make you more likely to develop this condition:  Participating in sports that involve running, kicking and jumping, especially on hard surfaces. These include: ? Basketball. ? Volleyball. ? Soccer. ? Track and field.  Having tight thigh muscles.  Having received steroid injections in the tendon.  Having had knee surgery.  Being 2-61 years old.  Having rheumatoid arthritis or diabetes.  Training too hard.  What are the signs or symptoms? The main symptom of this condition is pain in the front of the knee. The pain usually starts slowly then it gradually gets worse. It may become painful to straighten your leg. How is this diagnosed? This condition may be diagnosed based on:  Your symptoms.  A medical history.  A physical exam. During the physical exam, your health care provider may check for tenderness in your patella, tightness in your thigh muscles, and pain when you straighten your knee.  Imaging tests, including: ? X-rays. These  will show the position and condition of your patella. ? MRI. This will show any tears in your tendon. ? Ultrasound. This will show any swelling in your tendon and the thickness of your tendon.  How is this treated? Treatment for this condition depends on the stage of the condition. It may involve:  Avoiding activities that cause pain.  Icing your knee.  Taking an NSAID to reduce pain and swelling.  Doing stretching and strengthening exercises (physical therapy) when pain and swelling improve.  Having sound wave stimulation to promote healing.  Wearing a knee brace. This may be needed if your condition does not improve with treatment.  Using crutches or a walker. This may be needed if your condition does not improve with treatment.  Surgery. This may be done if you have stage 4 tendinitis.  Follow these instructions at home: If You Have a Knee Brace:  Wear it  as told by your health care provider. Remove it only as told by your health care provider.  Loosen the brace if your toes become numb and tingle, or if they turn cold and blue.  Do not let your brace get wet if it is not waterproof.  Keep the brace clean.  Ask your health care provider when it is safe for you to drive. Managing pain, stiffness, and swelling  Take over-the-counter and prescription medicines only as told by your health care provider.  If directed, apply ice to the injured area. ? Put ice in a plastic bag. ? Place a towel between your skin and the bag. ? Leave the ice on for 20 minutes, 2-3 times a day.  Move your toes often to avoid stiffness and to lessen swelling.  Raise (elevate) your knee above the level of your heart while you are sitting or lying down. Activity  Return to your normal activities as told by your health care provider. Ask your health care provider what activities are safe for you.  Do exercises as told by your health care provider. General instructions  Do not use the  injured limb to support your body weight until your health care provider says that you can. Use your crutches or walker as told by your health care provider.  Keep all follow-up visits as told by your health care provider. This is important. How is this prevented?  Warm up and stretch before being active.  Cool down and stretch after being active.  Give your body time to rest between periods of activity.  Make sure to use equipment that fits you.  Be safe and responsible while being active to avoid falls.  Do at least 150 minutes of moderate-intensity exercise each week, such as brisk walking or water aerobics.  Maintain physical fitness, including: ? Strength. ? Flexibility. ? Cardiovascular fitness. ? Endurance. Contact a health care provider if:  Your symptoms have not improve in 6 weeks.  Your symptoms get worse. This information is not intended to replace advice given to you by your health care provider. Make sure you discuss any questions you have with your health care provider. Document Released: 08/12/2005 Document Revised: 04/16/2016 Document Reviewed: 05/16/2015 Elsevier Interactive Patient Education  2018 [employer]  Prepatellar Bursitis Prepatellar bursitis is inflammation of the prepatellar bursa. The prepatellar bursa is a fluid-filled sac that cushions the kneecap (patella). Prepatellar bursitis happens when fluid builds up in the this sac and causes it to get larger. The condition causes knee pain. What are the causes? This condition may be caused by:  Constant pressure on the knees from kneeling.  A hit to the knee.  Falling on the knee.  A bacterial infection.  Moving the knee often in a forceful way.  What increases the risk? This condition is more likely to develop in:  People who play a sport that involves a risk for falls on the knee or hard hits (blows) to the knee. These sports  include: ? Football. ? Wrestling. ? Basketball. ? Soccer.  People who have to kneel for long periods of time, such as roofers, plumbers, and gardeners.  People with another inflammatory condition, such as gout or rheumatoid arthritis.  What are the signs or symptoms? The most common symptom of this condition is knee pain that gets better with rest. Other symptoms include:  Swelling on the front of the kneecap.  Warmth in the knee.  Tenderness with activity.  Redness in the knee.  Inability to bend the knee or to kneel.  How is this diagnosed? This condition may be diagnosed based on:  Your symptoms.  Your medical history.  A physical exam. During the exam, your provider will compare your knees and check for tenderness and pain when moving your knee. Your health care provider may also use a needle to remove fluid from the bursa to help diagnose an infection.  Tests, such as: ? A blood test that checks for infection. ? X-rays. These may be taken to check the structure of the patella. ? MRI or ultrasound. These may be done to check for swelling and fluid buildup in the bursa.  How is this treated? This condition may be treated by:  Resting the knee.  Applying ice to the knee.  Medicine, such as: ? Nonsteroidal anti-inflammatory drugs (NSAIDs). These can help to reduce pain and swelling. ? Antibiotic medicines. These may be needed if you have an infection. ? Steroid medicines. These may be prescribed if other treatments are not helping.  Raising (elevating) the knee while resting.  Doing strengthening and stretching exercises (physical therapy). These may be recommended after pain and swelling improve.  Having a procedure to remove fluid from the bursa. This may be done if other treatment is not helping.  Having surgery to remove the bursa. This may be done if you have a severe infection or if the condition keeps coming back after treatment.  Follow these  instructions at home: Medicines  Take over-the-counter and prescription medicines only as told by your health care provider.  If you were prescribed an antibiotic medicine, take it as told by your health care provider. Do not stop taking the antibiotic even if you start to feel better. Managing pain, stiffness, and swelling  If directed, apply ice to your knee. ? Put ice in a plastic bag. ? Place a towel between your skin and the bag. ? Leave the ice on for 20 minutes, 2-3 times a day.  Elevate your knee above the level of your heart while you are sitting or lying down. Driving  Do not drive or operate heavy machinery while taking prescription pain medicine.  Ask your health care provider when it is safe fpr you to drive. Activity  Rest your knee.  Avoid activities that cause pain.  Return to your normal activities as told by your health care provider. Ask your health care provider what activities are safe for you.  Do exercises as told by your health care provider. General instructions  Do not use the injured limb to support your body weight until your health care provider says that you can.  Do not use any tobacco products, such as cigarettes, chewing tobacco, and e-cigarettes. Tobacco can delay bone healing. If you need help quitting, ask your health care provider.  Keep all follow-up visits as told by your health care provider. This is important. How is this prevented?  Warm up and stretch before being active.  Cool down and stretch after being active.  Give your body time to rest between periods of activity.  Make sure to use equipment that fits you.  Be safe and responsible while being active to avoid falls.  Do at least 150 minutes of moderate-intensity exercise each week, such as brisk walking or water aerobics.  Maintain physical fitness, including: ? Strength. ? Flexibility. ? Cardiovascular fitness. ? Endurance. Contact a health care provider  if:  Your symptoms do not improve.  Your symptoms get worse.  Your  symptoms keep coming back after treatment.  You develop a fever and have warmth, redness, and swelling over your knee. This information is not intended to replace advice given to you by your health care provider. Make sure you discuss any questions you have with your health care provider. Document Released: 08/12/2005 Document Revised: 04/16/2016 Document Reviewed: 05/12/2015 Elsevier Interactive Patient Education  [employer].

## 2018-09-03 ENCOUNTER — Encounter: Payer: Self-pay | Admitting: Registered Nurse

## 2018-09-03 ENCOUNTER — Ambulatory Visit: Payer: Self-pay | Admitting: Registered Nurse

## 2018-09-03 VITALS — BP 102/76 | HR 86 | Temp 97.6°F

## 2018-09-03 DIAGNOSIS — J988 Other specified respiratory disorders: Principal | ICD-10-CM

## 2018-09-03 DIAGNOSIS — B9789 Other viral agents as the cause of diseases classified elsewhere: Secondary | ICD-10-CM

## 2018-09-03 NOTE — Progress Notes (Signed)
Subjective:    Patient ID: Rebecca Sweeney, female    DOB: 11-Jul-1961, 58 y.o.   MRN: [id number]  57y/o Caucasian established female c/o nasal and chest congestion, productive cough, maxillary and frontal sinus pressure. Sx began last week and progressively worsened. Has been using Dayquil, Ibuprofen, Emergen-C at home with little relief. Sx keeping her up at night and she is "exhausted."   Patient also requested refill on motrin 800mg  po TID prn       Review of Systems  Constitutional: Positive for fatigue. Negative for activity change, appetite change, chills, diaphoresis, fever and unexpected weight change.  HENT: Positive for congestion, postnasal drip, sinus pressure and sinus pain. Negative for dental problem, drooling, ear discharge, ear pain, facial swelling, hearing loss, mouth sores, nosebleeds, rhinorrhea, sneezing, sore throat, tinnitus, trouble swallowing and voice change.   Eyes: Negative for photophobia, pain, discharge, redness, itching and visual disturbance.  Respiratory: Positive for cough. Negative for choking, chest tightness, shortness of breath, wheezing and stridor.   Cardiovascular: Negative for chest pain, palpitations and leg swelling.  Gastrointestinal: Negative for abdominal distention, abdominal pain, blood in stool, constipation, diarrhea, nausea and vomiting.  Endocrine: Negative for cold intolerance and heat intolerance.  Genitourinary: Negative for difficulty urinating, dysuria and hematuria.  Musculoskeletal: Negative for arthralgias, back pain, gait problem, joint swelling, myalgias, neck pain and neck stiffness.  Skin: Negative for color change, pallor, rash and wound.  Allergic/Immunologic: Positive for environmental allergies. Negative for food allergies.  Neurological: Positive for headaches. Negative for dizziness, tremors, seizures, syncope, facial asymmetry, speech difficulty, weakness, light-headedness and numbness.  Hematological: Negative for  adenopathy. Does not bruise/bleed easily.  Psychiatric/Behavioral: Positive for sleep disturbance. Negative for agitation, behavioral problems and confusion.       Objective:   Physical Exam Vitals signs and nursing note reviewed.  Constitutional:      General: She is not in acute distress.    Appearance: Normal appearance. She is well-developed, well-groomed and normal weight. She is ill-appearing. She is not toxic-appearing or diaphoretic.  HENT:     Head: Normocephalic and atraumatic.     Jaw: There is normal jaw occlusion. No trismus.     Salivary Glands: Right salivary gland is not diffusely enlarged or tender. Left salivary gland is not diffusely enlarged or tender.     Right Ear: Hearing, ear canal and external ear normal. A middle ear effusion is present. There is no impacted cerumen.     Left Ear: Hearing, ear canal and external ear normal. A middle ear effusion is present. There is no impacted cerumen.     Nose: Mucosal edema, congestion and rhinorrhea present. No nasal deformity, septal deviation or laceration.     Right Turbinates: Enlarged and swollen. Not pale.     Left Turbinates: Enlarged and swollen. Not pale.     Right Sinus: Maxillary sinus tenderness and frontal sinus tenderness present.     Left Sinus: Maxillary sinus tenderness and frontal sinus tenderness present.     Comments: Maxillary greater than frontal sinuses TTP bilaterally; cobblestoning posterior pharynx; bilateral TMs air fluid level clear; nasal turbinates edema/erythema clear discharge; frequent nasal sniffing    Mouth/Throat:     Mouth: Mucous membranes are moist. Mucous membranes are not pale, not dry and not cyanotic. No lacerations or oral lesions.     Dentition: Normal dentition. Does not have dentures. No dental caries or dental abscesses.     Pharynx: Uvula midline. Posterior oropharyngeal erythema present. No oropharyngeal exudate or  uvula swelling.     Tonsils: No tonsillar abscesses.  Eyes:      General: Lids are normal. No scleral icterus.       Right eye: No foreign body, discharge or hordeolum.        Left eye: No foreign body, discharge or hordeolum.     Extraocular Movements: Extraocular movements intact.     Right eye: Normal extraocular motion and no nystagmus.     Left eye: Normal extraocular motion and no nystagmus.     Conjunctiva/sclera: Conjunctivae normal.     Right eye: Right conjunctiva is not injected. No chemosis, exudate or hemorrhage.    Left eye: Left conjunctiva is not injected. No chemosis, exudate or hemorrhage.    Pupils: Pupils are equal, round, and reactive to light. Pupils are equal.     Right eye: Pupil is round and reactive.     Left eye: Pupil is round and reactive.  Neck:     Musculoskeletal: Normal range of motion and neck supple. Normal range of motion. No edema, erythema, neck rigidity, spinous process tenderness or muscular tenderness.     Thyroid: No thyroid mass or thyromegaly.     Trachea: Trachea normal. No tracheal tenderness or tracheal deviation.  Cardiovascular:     Rate and Rhythm: Normal rate and regular rhythm.     Chest Wall: PMI is not displaced.     Heart sounds: Normal heart sounds, S1 normal and S2 normal. No murmur. No friction rub. No gallop.   Pulmonary:     Effort: Pulmonary effort is normal. No accessory muscle usage or respiratory distress.     Breath sounds: Normal breath sounds and air entry. No stridor, decreased air movement or transmitted upper airway sounds. No decreased breath sounds, wheezing, rhonchi or rales.     Comments: Frequent nonproductive cough in exam room; spoke full sentences without difficulty Chest:     Chest wall: No tenderness.  Abdominal:     General: Abdomen is flat. There is no distension.     Palpations: Abdomen is soft.     Tenderness: There is no guarding.  Musculoskeletal: Normal range of motion.        General: No tenderness.     Right shoulder: Normal.     Left shoulder: Normal.      Right hip: Normal.     Left hip: Normal.     Right knee: Normal.     Left knee: Normal.     Cervical back: Normal.     Thoracic back: Normal.     Lumbar back: Normal.     Right hand: Normal.     Left hand: Normal.  Lymphadenopathy:     Head:     Right side of head: No submental, submandibular, tonsillar, preauricular, posterior auricular or occipital adenopathy.     Left side of head: No submental, submandibular, tonsillar, preauricular, posterior auricular or occipital adenopathy.     Cervical: No cervical adenopathy.     Right cervical: No superficial, deep or posterior cervical adenopathy.    Left cervical: No superficial, deep or posterior cervical adenopathy.  Skin:    General: Skin is warm and dry.     Capillary Refill: Capillary refill takes less than 2 seconds.     Coloration: Skin is not ashen, cyanotic, jaundiced, mottled, pale or sallow.     Findings: No abrasion, bruising, burn, ecchymosis, erythema, laceration, lesion, petechiae or rash.     Nails: There is no clubbing.   Neurological:  General: No focal deficit present.     Mental Status: She is alert and oriented to person, place, and time. Mental status is at baseline. She is not disoriented.     GCS: GCS eye subscore is 4. GCS verbal subscore is 5. GCS motor subscore is 6.     Cranial Nerves: Cranial nerves are intact. No cranial nerve deficit, dysarthria or facial asymmetry.     Sensory: Sensation is intact. No sensory deficit.     Motor: Motor function is intact. No weakness, tremor, atrophy, abnormal muscle tone or seizure activity.     Coordination: Coordination is intact. Coordination normal.     Gait: Gait is intact. Gait normal.     Comments: Gait sure and steady in hallway ;on/off exam table and in/out of chair without difficulty;  Psychiatric:        Mood and Affect: Mood normal.        Speech: Speech normal.        Behavior: Behavior normal. Behavior is cooperative.        Thought Content: Thought  content normal.        Judgment: Judgment normal.           Assessment & Plan:  A-viral URI with cough  P-refilled motrin 800mg  po TID prn pain/fever take with food #30 RF0 dispensed from [facility].  Patient may use normal saline nasal spray 2 sprays each nostril q2h wa as needed. flonase 1 spray each nostril BID at home, phenylephrine 5-10mg  po q6h prn rhinitis 4 UD given to patient from clinic stock.  Cough drops po q2h prn cough given 4 UD from clinic stock.  .  Patient denied personal or family history of ENT cancer.  OTC antihistamine of choice claritin/zyrtec 10mg  po daily.  Avoid triggers if possible.  Shower prior to bedtime and am if nasal/sinus congestion worse mornings.  OTC delsym/nyquil or robitussin without mucinex po prn cough per manufacturer's instructions.  Prednisone 10mg  po taper with breakfast (60/50/40/30/20/10) #21 RF0 dispensed from [facility].   Call or return to clinic as needed if these symptoms worsen or fail to improve as anticipated.   Exitcare handout on viral URI and sinus rinse Hydrate to keep urine pale clear yellow tinged as cough/runny nose lose up to extra liter fluids per day.  Honey with lemon, avoid large amounts dairy as can increase mucous production.  Patient verbalized understanding of instructions, agreed with plan of care and had no further questions at this time.  P2:  Avoidance and hand washing.

## 2018-09-04 MED ORDER — PREDNISONE 10 MG (21) PO TBPK: ORAL_TABLET | ORAL | 0 refills | Status: DC

## 2018-09-04 NOTE — Patient Instructions (Signed)
How to Perform a Sinus Rinse A sinus rinse is a home treatment that is used to rinse your sinuses with a sterile mixture of salt and water (saline solution). Sinuses are air-filled spaces in your skull behind the bones of your face and forehead that open into your nasal cavity. A sinus rinse can help to clear mucus, dirt, dust, or pollen from your nasal cavity. You may do a sinus rinse when you have a cold, a virus, nasal allergy symptoms, a sinus infection, or stuffiness in your nose or sinuses. Talk with your health care provider about whether a sinus rinse might help you. What are the risks? A sinus rinse is generally safe and effective. However, there are a few risks, which include:  A burning sensation in your sinuses. This may happen if you do not make the saline solution as directed. Be sure to follow all directions when making the saline solution.  Nasal irritation.  Infection from contaminated water. This is rare, but possible. Do not do a sinus rinse if you have had ear or nasal surgery, ear infection, or blocked ears. Supplies needed:  Saline solution or powder.  Distilled or sterile water may be needed to mix with saline powder. ? You may use boiled and cooled tap water. Boil tap water for 5 minutes; cool until it is lukewarm. Use within 24 hours. ? Do not use regular tap water to mix with the saline solution.  Neti pot or nasal rinse bottle. These supplies release the saline solution into your nose and through your sinuses. Neti pots and nasal rinse bottles can be purchased at [employer], a health food store, or online. How to perform a sinus rinse  1. Wash your hands with soap and water. 2. Wash your device according to the directions that came with the product and then dry it. 3. Use the solution that comes with your product or one that is sold separately in stores. Follow the mixing directions on the package if you need to mix with sterile or distilled  water. 4. Fill the device with the amount of saline solution noted in the device instructions. 5. Stand over a sink and tilt your head sideways over the sink. 6. Place the spout of the device in your upper nostril (the one closer to the ceiling). 7. Gently pour or squeeze the saline solution into your nasal cavity. The liquid should drain out from the lower nostril if you are not too congested. 8. While rinsing, breathe through your open mouth. 9. Gently blow your nose to clear any mucus and rinse solution. Blowing too hard may cause ear pain. 10. Repeat in your other nostril. 11. Clean and rinse your device with clean water and then air-dry it. Talk with your health care provider or pharmacist if you have questions about how to do a sinus rinse. Summary  A sinus rinse is a home treatment that is used to rinse your sinuses with a sterile mixture of salt and water (saline solution).  A sinus rinse is generally safe and effective. Follow all instructions carefully.  Before doing a sinus rinse, talk with your health care provider about whether it would be helpful for you. This information is not intended to replace advice given to you by your health care provider. Make sure you discuss any questions you have with your health care provider. Document Released: 03/09/2014 Document Revised: 06/09/2017 Document Reviewed: 06/09/2017 Else[employer] Interactive Patient Education  2019 [employer] Acute Bronchitis, Adult  Acute  bronchitis is sudden (acute) swelling of the air tubes (bronchi) in the lungs. Acute bronchitis causes these tubes to fill with mucus, which can make it hard to breathe. It can also cause coughing or wheezing. In adults, acute bronchitis usually goes away within 2 weeks. A cough caused by bronchitis may last up to 3 weeks. Smoking, allergies, and asthma can make the condition worse. Repeated episodes of bronchitis may cause further lung problems, such as chronic obstructive pulmonary  disease (COPD). What are the causes? This condition can be caused by germs and by substances that irritate the lungs, including:  Cold and flu viruses. This condition is most often caused by the same virus that causes a cold.  Bacteria.  Exposure to tobacco smoke, dust, fumes, and air pollution. What increases the risk? This condition is more likely to develop in people who:  Have close contact with someone with acute bronchitis.  Are exposed to lung irritants, such as tobacco smoke, dust, fumes, and vapors.  Have a weak immune system.  Have a respiratory condition such as asthma. What are the signs or symptoms? Symptoms of this condition include:  A cough.  Coughing up clear, yellow, or green mucus.  Wheezing.  Chest congestion.  Shortness of breath.  A fever.  Body aches.  Chills.  A sore throat. How is this diagnosed? This condition is usually diagnosed with a physical exam. During the exam, your health care provider may order tests, such as chest X-rays, to rule out other conditions. He or she may also:  Test a sample of your mucus for bacterial infection.  Check the level of oxygen in your blood. This is done to check for pneumonia.  Do a chest X-ray or lung function testing to rule out pneumonia and other conditions.  Perform blood tests. Your health care provider will also ask about your symptoms and medical history. How is this treated? Most cases of acute bronchitis clear up over time without treatment. Your health care provider may recommend:  Drinking more fluids. Drinking more makes your mucus thinner, which may make it easier to breathe.  Taking a medicine for a fever or cough.  Taking an antibiotic medicine.  Using an inhaler to help improve shortness of breath and to control a cough.  Using a cool mist vaporizer or humidifier to make it easier to breathe. Follow these instructions at home: Medicines  Take over-the-counter and  prescription medicines only as told by your health care provider.  If you were prescribed an antibiotic, take it as told by your health care provider. Do not stop taking the antibiotic even if you start to feel better. General instructions   Get plenty of rest.  Drink enough fluids to keep your urine pale yellow.  Avoid smoking and secondhand smoke. Exposure to cigarette smoke or irritating chemicals will make bronchitis worse. If you smoke and you need help quitting, ask your health care provider. Quitting smoking will help your lungs heal faster.  Use an inhaler, cool mist vaporizer, or humidifier as told by your health care provider.  Keep all follow-up visits as told by your health care provider. This is important. How is this prevented? To lower your risk of getting this condition again:  Wash your hands often with soap and water. If soap and water are not available, use hand sanitizer.  Avoid contact with people who have cold symptoms.  Try not to touch your hands to your mouth, nose, or eyes.  Make sure to get  the flu shot every year. Contact a health care provider if:  Your symptoms do not improve in 2 weeks of treatment. Get help right away if:  You cough up blood.  You have chest pain.  You have severe shortness of breath.  You become dehydrated.  You faint or keep feeling like you are going to faint.  You keep vomiting.  You have a severe headache.  Your fever or chills gets worse. This information is not intended to replace advice given to you by your health care provider. Make sure you discuss any questions you have with your health care provider. Document Released: 09/19/2004 Document Revised: 03/26/2017 Document Reviewed: 01/31/2016 Else[employer] Interactive Patient Education  2019 [employer] Viral Respiratory Infection A respiratory infection is an illness that affects part of the respiratory system, such as the lungs, nose, or throat. A respiratory  infection that is caused by a virus is called a viral respiratory infection. Common types of viral respiratory infections include:  A cold.  The flu (influenza).  A respiratory syncytial virus (RSV) infection. What are the causes? This condition is caused by a virus. What are the signs or symptoms? Symptoms of this condition include:  A stuffy or runny nose.  Yellow or green nasal discharge.  A cough.  Sneezing.  Fatigue.  Achy muscles.  A sore throat.  Sweating or chills.  A fever.  A headache. How is this diagnosed? This condition may be diagnosed based on:  Your symptoms.  A physical exam.  Testing of nasal swabs. How is this treated? This condition may be treated with medicines, such as:  Antiviral medicine. This may shorten the length of time a person has symptoms.  Expectorants. These make it easier to cough up mucus.  Decongestant nasal sprays.  Acetaminophen or NSAIDs to relieve fever and pain. Antibiotic medicines are not prescribed for viral infections. This is because antibiotics are designed to kill bacteria. They are not effective against viruses. Follow these instructions at home:  Managing pain and congestion  Take over-the-counter and prescription medicines only as told by your health care provider.  If you have a sore throat, gargle with a salt-water mixture 3-4 times a day or as needed. To make a salt-water mixture, completely dissolve -1 tsp of salt in 1 cup of warm water.  Use nose drops made from salt water to ease congestion and soften raw skin around your nose.  Drink enough fluid to keep your urine pale yellow. This helps prevent dehydration and helps loosen up mucus. General instructions  Rest as much as possible.  Do not drink alcohol.  Do not use any products that contain nicotine or tobacco, such as cigarettes and e-cigarettes. If you need help quitting, ask your health care provider.  Keep all follow-up visits as told  by your health care provider. This is important. How is this prevented?   Get an annual flu shot. You may get the flu shot in late summer, fall, or winter. Ask your health care provider when you should get your flu shot.  Avoid exposing others to your respiratory infection. ? Stay home from work or school as told by your health care provider. ? Wash your hands with soap and water often, especially after you cough or sneeze. If soap and water are not available, use alcohol-based hand sanitizer.  Avoid contact with people who are sick during cold and flu season. This is generally fall and winter. Contact a health care provider if:  Your symptoms  last for 10 days or longer.  Your symptoms get worse over time.  You have a fever.  You have severe sinus pain in your face or forehead.  The glands in your jaw or neck become very swollen. Get help right away if you:  Feel pain or pressure in your chest.  Have shortness of breath.  Faint or feel like you will faint.  Have severe and persistent vomiting.  Feel confused or disoriented. Summary  A respiratory infection is an illness that affects part of the respiratory system, such as the lungs, nose, or throat. A respiratory infection that is caused by a virus is called a viral respiratory infection.  Common types of viral respiratory infections are a cold, influenza, and respiratory syncytial virus (RSV) infection.  Symptoms of this condition include a stuffy or runny nose, cough, sneezing, fatigue, achy muscles, sore throat, and fevers or chills.  Antibiotic medicines are not prescribed for viral infections. This is because antibiotics are designed to kill bacteria. They are not effective against viruses. This information is not intended to replace advice given to you by your health care provider. Make sure you discuss any questions you have with your health care provider. Document Released: 05/22/2005 Document Revised: 09/22/2017  Document Reviewed: 09/22/2017 [employer] Interactive Patient Education  2019 [employer]

## 2018-09-08 ENCOUNTER — Ambulatory Visit: Payer: Self-pay | Admitting: Registered Nurse

## 2018-09-08 ENCOUNTER — Encounter: Payer: Self-pay | Admitting: Registered Nurse

## 2018-09-08 VITALS — BP 112/70 | HR 79 | Temp 98.4°F | Resp 18

## 2018-09-08 DIAGNOSIS — B9789 Other viral agents as the cause of diseases classified elsewhere: Principal | ICD-10-CM

## 2018-09-08 DIAGNOSIS — J069 Acute upper respiratory infection, unspecified: Secondary | ICD-10-CM

## 2018-09-08 MED ORDER — PROMETHAZINE-DM 6.25-15 MG/5ML PO SOLN: 5.0000 mL | Freq: Four times a day (QID) | ORAL | 0 refills | Status: DC | PRN

## 2018-09-08 MED ORDER — BENZONATATE 200 MG PO CAPS: 200.0000 mg | ORAL_CAPSULE | Freq: Two times a day (BID) | ORAL | 0 refills | Status: AC | PRN

## 2018-09-08 NOTE — Patient Instructions (Addendum)
Cough, Adult  Coughing is a reflex that clears your throat and your airways. Coughing helps to heal and protect your lungs. It is normal to cough occasionally, but a cough that happens with other symptoms or lasts a long time may be a sign of a condition that needs treatment. A cough may last only 2-3 weeks (acute), or it may last longer than 8 weeks (chronic). What are the causes? Coughing is commonly caused by:  Breathing in substances that irritate your lungs.  A viral or bacterial respiratory infection.  Allergies.  Asthma.  Postnasal drip.  Smoking.  Acid backing up from the stomach into the esophagus (gastroesophageal reflux).  Certain medicines.  Chronic lung problems, including COPD (or rarely, lung cancer).  Other medical conditions such as heart failure. Follow these instructions at home: Pay attention to any changes in your symptoms. Take these actions to help with your discomfort:  Take medicines only as told by your health care provider. ? If you were prescribed an antibiotic medicine, take it as told by your health care provider. Do not stop taking the antibiotic even if you start to feel better. ? Talk with your health care provider before you take a cough suppressant medicine.  Drink enough fluid to keep your urine clear or pale yellow.  If the air is dry, use a cold steam vaporizer or humidifier in your bedroom or your home to help loosen secretions.  Avoid anything that causes you to cough at work or at home.  If your cough is worse at night, try sleeping in a semi-upright position.  Avoid cigarette smoke. If you smoke, quit smoking. If you need help quitting, ask your health care provider.  Avoid caffeine.  Avoid alcohol.  Rest as needed. Contact a health care provider if:  You have new symptoms.  You cough up pus.  Your cough does not get better after 2-3 weeks, or your cough gets worse.  You cannot control your cough with suppressant  medicines and you are losing sleep.  You develop pain that is getting worse or pain that is not controlled with pain medicines.  You have a fever.  You have unexplained weight loss.  You have night sweats. Get help right away if:  You cough up blood.  You have difficulty breathing.  Your heartbeat is very fast. This information is not intended to replace advice given to you by your health care provider. Make sure you discuss any questions you have with your health care provider. Document Released: 02/08/2011 Document Revised: 01/18/2016 Document Reviewed: 10/19/2014 Elsevier Interactive Patient Education  2019 [employer] Inc. Viral Respiratory Infection A respiratory infection is an illness that affects part of the respiratory system, such as the lungs, nose, or throat. A respiratory infection that is caused by a virus is called a viral respiratory infection. Common types of viral respiratory infections include:  A cold.  The flu (influenza).  A respiratory syncytial virus (RSV) infection. What are the causes? This condition is caused by a virus. What are the signs or symptoms? Symptoms of this condition include:  A stuffy or runny nose.  Yellow or green nasal discharge.  A cough.  Sneezing.  Fatigue.  Achy muscles.  A sore throat.  Sweating or chills.  A fever.  A headache. How is this diagnosed? This condition may be diagnosed based on:  Your symptoms.  A physical exam.  Testing of nasal swabs. How is this treated? This condition may be treated with medicines, such as:  Antiviral medicine. This may shorten the length of time a person has symptoms.  Expectorants. These make it easier to cough up mucus.  Decongestant nasal sprays.  Acetaminophen or NSAIDs to relieve fever and pain. Antibiotic medicines are not prescribed for viral infections. This is because antibiotics are designed to kill bacteria. They are not effective against viruses. Follow  these instructions at home:  Managing pain and congestion  Take over-the-counter and prescription medicines only as told by your health care provider.  If you have a sore throat, gargle with a salt-water mixture 3-4 times a day or as needed. To make a salt-water mixture, completely dissolve -1 tsp of salt in 1 cup of warm water.  Use nose drops made from salt water to ease congestion and soften raw skin around your nose.  Drink enough fluid to keep your urine pale yellow. This helps prevent dehydration and helps loosen up mucus. General instructions  Rest as much as possible.  Do not drink alcohol.  Do not use any products that contain nicotine or tobacco, such as cigarettes and e-cigarettes. If you need help quitting, ask your health care provider.  Keep all follow-up visits as told by your health care provider. This is important. How is this prevented?   Get an annual flu shot. You may get the flu shot in late summer, fall, or winter. Ask your health care provider when you should get your flu shot.  Avoid exposing others to your respiratory infection. ? Stay home from work or school as told by your health care provider. ? Wash your hands with soap and water often, especially after you cough or sneeze. If soap and water are not available, use alcohol-based hand sanitizer.  Avoid contact with people who are sick during cold and flu season. This is generally fall and winter. Contact a health care provider if:  Your symptoms last for 10 days or longer.  Your symptoms get worse over time.  You have a fever.  You have severe sinus pain in your face or forehead.  The glands in your jaw or neck become very swollen. Get help right away if you:  Feel pain or pressure in your chest.  Have shortness of breath.  Faint or feel like you will faint.  Have severe and persistent vomiting.  Feel confused or disoriented. Summary  A respiratory infection is an illness that affects  part of the respiratory system, such as the lungs, nose, or throat. A respiratory infection that is caused by a virus is called a viral respiratory infection.  Common types of viral respiratory infections are a cold, influenza, and respiratory syncytial virus (RSV) infection.  Symptoms of this condition include a stuffy or runny nose, cough, sneezing, fatigue, achy muscles, sore throat, and fevers or chills.  Antibiotic medicines are not prescribed for viral infections. This is because antibiotics are designed to kill bacteria. They are not effective against viruses. This information is not intended to replace advice given to you by your health care provider. Make sure you discuss any questions you have with your health care provider. Document Released: 05/22/2005 Document Revised: 09/22/2017 Document Reviewed: 09/22/2017 Elsevier Interactive Patient Education  2019 [employer] How to Use a Metered Dose Inhaler A metered dose inhaler is a handheld device for taking medicine that must be breathed into the lungs (inhaled). The device can be used to deliver a variety of inhaled medicines, including:  Quick relief or rescue medicines, such as bronchodilators.  Controller medicines, such as  corticosteroids. The medicine is delivered by pushing down on a metal canister to release a preset amount of spray and medicine. Each device contains the amount of medicine that is needed for a preset number of uses (inhalations). Your health care provider may recommend that you use a spacer with your inhaler to help you take the medicine more effectively. A spacer is a plastic tube with a mouthpiece on one end and an opening that connects to the inhaler on the other end. A spacer holds the medicine in a tube for a short time, which allows you to inhale more medicine. What are the risks? If you do not use your inhaler correctly, medicine might not reach your lungs to help you breathe. Inhaler medicine can cause  side effects, such as:  Mouth or throat infection.  Cough.  Hoarseness.  Headache.  Nausea and vomiting.  Lung infection (pneumonia) in people who have a lung condition called COPD. How to use a metered dose inhaler without a spacer  1. Remove the cap from the inhaler. 2. If you are using the inhaler for the first time, shake it for 5 seconds, turn it away from your face, then release 4 puffs into the air. This is called priming. 3. Shake the inhaler for 5 seconds. 4. Position the inhaler so the top of the canister faces up. 5. Put your index finger on the top of the medicine canister. Support the bottom of the inhaler with your thumb. 6. Breathe out normally and as completely as possible, away from the inhaler. 7. Either place the inhaler between your teeth and close your lips tightly around the mouthpiece, or hold the inhaler 1-2 inches (2.5-5 cm) away from your open mouth. Keep your tongue down out of the way. If you are unsure which technique to use, ask your health care provider. 8. Press the canister down with your index finger to release the medicine, then inhale deeply and slowly through your mouth (not your nose) until your lungs are completely filled. Inhaling should take 4-6 seconds. 9. Hold the medicine in your lungs for 5-10 seconds (10 seconds is best). This helps the medicine get into the small airways of your lungs. 10. With your lips in a tight circle (pursed), breathe out slowly. 11. Repeat steps 3-10 until you have taken the number of puffs that your health care provider directed. Wait about 1 minute between puffs or as directed. 12. Put the cap on the inhaler. 13. If you are using a steroid inhaler, rinse your mouth with water, gargle, and spit out the water. Do not swallow the water. How to use a metered dose inhaler with a spacer  1. Remove the cap from the inhaler. 2. If you are using the inhaler for the first time, shake it for 5 seconds, turn it away from your  face, then release 4 puffs into the air. This is called priming. 3. Shake the inhaler for 5 seconds. 4. Place the open end of the spacer onto the inhaler mouthpiece. 5. Position the inhaler so the top of the canister faces up and the spacer mouthpiece faces you. 6. Put your index finger on the top of the medicine canister. Support the bottom of the inhaler and the spacer with your thumb. 7. Breathe out normally and as completely as possible, away from the spacer. 8. Place the spacer between your teeth and close your lips tightly around it. Keep your tongue down out of the way. 9. Press the canister down  with your index finger to release the medicine, then inhale deeply and slowly through your mouth (not your nose) until your lungs are completely filled. Inhaling should take 4-6 seconds. 10. Hold the medicine in your lungs for 5-10 seconds (10 seconds is best). This helps the medicine get into the small airways of your lungs. 11. With your lips in a tight circle (pursed), breathe out slowly. 12. Repeat steps 3-11 until you have taken the number of puffs that your health care provider directed. Wait about 1 minute between puffs or as directed. 13. Remove the spacer from the inhaler and put the cap on the inhaler. 14. If you are using a steroid inhaler, rinse your mouth with water, gargle, and spit out the water. Do not swallow the water. Follow these instructions at home:  Take your inhaled medicine only as told by your health care provider. Do not use the inhaler more than directed by your health care provider.  Keep all follow-up visits as told by your health care provider. This is important.  If your inhaler has a counter, you can check it to determine how full your inhaler is. If your inhaler does not have a counter, ask your health care provider when you will need to refill your inhaler and write the refill date on a calendar or on your inhaler canister. Note that you cannot know when an  inhaler is empty by shaking it.  Follow directions on the package insert for care and cleaning of your inhaler and spacer. Contact a health care provider if:  Symptoms are only partially relieved with your inhaler.  You are having trouble using your inhaler.  You have an increase in phlegm.  You have headaches. Get help right away if:  You feel little or no relief after using your inhaler.  You have dizziness.  You have a fast heart rate.  You have chills or a fever.  You have night sweats.  There is blood in your phlegm. Summary  A metered dose inhaler is a handheld device for taking medicine that must be breathed into the lungs (inhaled).  The medicine is delivered by pushing down on a metal canister to release a preset amount of spray and medicine.  Each device contains the amount of medicine that is needed for a preset number of uses (inhalations). This information is not intended to replace advice given to you by your health care provider. Make sure you discuss any questions you have with your health care provider. Document Released: 08/12/2005 Document Revised: 03/03/2017 Document Reviewed: 07/02/2016 Elsevier Interactive Patient Education  2019 [employer].

## 2018-09-08 NOTE — Progress Notes (Signed)
Subjective:    Patient ID: Dionicio StallSonja Touchette, female    DOB: 13-Nov-1960, 58 y.o.   MRN: [id number]  57y/o caucasian married female established patient last seen 09/03/2018 for cough/runny nose.  Trial phenylephrine/prednisone taper/flonase/saline/motrin helped some but cough wearing her out would like refill on cough medicine she received a couple years ago cannot remember name.  "I need some good sleep"  Tessalon pearles helped in the past but has run out.  Has used inhaler in the past at home hasn't tried again.  Is using flonase/nasal saline/phenylephrine/cough drops without relief.   Cough not improving  Review of Systems  Constitutional: Positive for fatigue. Negative for activity change, appetite change, chills, diaphoresis, fever and unexpected weight change.  HENT: Positive for postnasal drip, rhinorrhea, sinus pressure and sinus pain. Negative for congestion, dental problem, drooling, ear discharge, ear pain, facial swelling, hearing loss, mouth sores, nosebleeds, sneezing, sore throat, tinnitus, trouble swallowing and voice change.   Eyes: Negative for photophobia, pain, discharge, redness, itching and visual disturbance.  Respiratory: Positive for cough. Negative for choking, chest tightness, shortness of breath, wheezing and stridor.   Cardiovascular: Negative for chest pain, palpitations and leg swelling.  Gastrointestinal: Negative for abdominal distention, abdominal pain, diarrhea, nausea and vomiting.  Endocrine: Negative for cold intolerance and heat intolerance.  Genitourinary: Negative for difficulty urinating, dysuria and hematuria.  Musculoskeletal: Negative for arthralgias, back pain, gait problem, joint swelling, myalgias, neck pain and neck stiffness.  Skin: Negative for color change, pallor, rash and wound.  Allergic/Immunologic: Positive for environmental allergies. Negative for food allergies.  Neurological: Positive for headaches. Negative for dizziness, tremors,  seizures, syncope, facial asymmetry, speech difficulty, weakness, light-headedness and numbness.  Hematological: Negative for adenopathy. Does not bruise/bleed easily.  Psychiatric/Behavioral: Negative for agitation, behavioral problems, confusion and sleep disturbance.       Objective:   Physical Exam Vitals signs and nursing note reviewed.  Constitutional:      General: She is not in acute distress.    Appearance: She is well-developed, well-groomed and normal weight. She is ill-appearing. She is not toxic-appearing or diaphoretic.  HENT:     Head: Normocephalic and atraumatic.     Jaw: There is normal jaw occlusion. No trismus.     Right Ear: Hearing, ear canal and external ear normal. A middle ear effusion is present.     Left Ear: Hearing, ear canal and external ear normal. A middle ear effusion is present.     Nose: Mucosal edema, congestion and rhinorrhea present. No nasal deformity, septal deviation or laceration.     Right Turbinates: Enlarged and swollen.     Left Turbinates: Enlarged and swollen.     Right Sinus: No maxillary sinus tenderness or frontal sinus tenderness.     Left Sinus: No maxillary sinus tenderness or frontal sinus tenderness.     Mouth/Throat:     Lips: Pink. No lesions.     Mouth: Mucous membranes are moist. Mucous membranes are not pale, not dry and not cyanotic. No lacerations or oral lesions.     Dentition: Normal dentition. Does not have dentures. No dental caries or dental abscesses.     Tongue: No lesions.     Pharynx: Uvula midline. Pharyngeal swelling and posterior oropharyngeal erythema present. No oropharyngeal exudate or uvula swelling.     Tonsils: No tonsillar exudate or tonsillar abscesses.     Comments: Bilateral TMs air fluid level clear; cobblestoning posterior pharynx; bilateral nasal turbinates edema/erythema clear discharge; bilateral allergic shiners Eyes:  General: Lids are normal. Allergic shiner present. No visual field deficit  or scleral icterus.       Right eye: No foreign body, discharge or hordeolum.        Left eye: No foreign body, discharge or hordeolum.     Extraocular Movements: Extraocular movements intact.     Right eye: Normal extraocular motion and no nystagmus.     Left eye: Normal extraocular motion and no nystagmus.     Conjunctiva/sclera: Conjunctivae normal.     Right eye: Right conjunctiva is not injected. No chemosis, exudate or hemorrhage.    Left eye: Left conjunctiva is not injected. No chemosis, exudate or hemorrhage.    Pupils: Pupils are equal, round, and reactive to light. Pupils are equal.     Right eye: Pupil is round and reactive.     Left eye: Pupil is round and reactive.  Neck:     Musculoskeletal: Normal range of motion and neck supple. Normal range of motion. No edema, erythema, neck rigidity, spinous process tenderness or muscular tenderness.     Thyroid: No thyroid mass or thyromegaly.     Trachea: Trachea and phonation normal. No tracheal tenderness or tracheal deviation.  Cardiovascular:     Rate and Rhythm: Normal rate and regular rhythm.     Chest Wall: PMI is not displaced.     Pulses: Normal pulses.     Heart sounds: Normal heart sounds, S1 normal and S2 normal. No murmur. No friction rub. No gallop.   Pulmonary:     Effort: Pulmonary effort is normal. No accessory muscle usage or respiratory distress.     Breath sounds: Normal breath sounds and air entry. No stridor, decreased air movement or transmitted upper airway sounds. No decreased breath sounds, wheezing, rhonchi or rales.     Comments: Frequent nonproductive cough in exam room; spoke full sentences without difficulty Chest:     Chest wall: No tenderness.  Abdominal:     General: There is no distension.     Palpations: Abdomen is soft.     Tenderness: There is no guarding.  Musculoskeletal: Normal range of motion.        General: No tenderness.     Right shoulder: Normal.     Left shoulder: Normal.      Right elbow: Normal.    Left elbow: Normal.     Right hip: Normal.     Left hip: Normal.     Right knee: Normal.     Left knee: Normal.     Cervical back: Normal.     Thoracic back: Normal.     Lumbar back: Normal.     Right hand: Normal.     Left hand: Normal.  Lymphadenopathy:     Head:     Right side of head: No submental, submandibular, tonsillar, preauricular, posterior auricular or occipital adenopathy.     Left side of head: No submental, submandibular, tonsillar, preauricular, posterior auricular or occipital adenopathy.     Cervical: No cervical adenopathy.     Right cervical: No superficial, deep or posterior cervical adenopathy.    Left cervical: No superficial, deep or posterior cervical adenopathy.  Skin:    General: Skin is warm and dry.     Capillary Refill: Capillary refill takes less than 2 seconds.     Coloration: Skin is not ashen, cyanotic, jaundiced, mottled, pale or sallow.     Findings: No abrasion, bruising, burn, ecchymosis, erythema, laceration, lesion, petechiae or rash.  Nails: There is no clubbing.   Neurological:     General: No focal deficit present.     Mental Status: She is alert and oriented to person, place, and time. Mental status is at baseline. She is not disoriented.     GCS: GCS eye subscore is 4. GCS verbal subscore is 5. GCS motor subscore is 6.     Cranial Nerves: Cranial nerves are intact. No cranial nerve deficit, dysarthria or facial asymmetry.     Sensory: Sensation is intact. No sensory deficit.     Motor: Motor function is intact. No weakness, tremor, atrophy, abnormal muscle tone or seizure activity.     Coordination: Coordination is intact. Coordination normal.     Gait: Gait is intact. Gait normal.     Comments: In/out of chair without difficulty; gait sure and steady in clinic  Psychiatric:        Attention and Perception: Attention and perception normal.        Mood and Affect: Mood and affect normal.        Speech: Speech  normal.        Behavior: Behavior normal. Behavior is cooperative.        Thought Content: Thought content normal.        Cognition and Memory: Cognition and memory normal.        Judgment: Judgment normal.           Assessment & Plan:  A-viral uri with cough  P-failed prednisone taper/phenylephrine/flonase/saline/cough drops.  Refilled electronic rx Tessalon pearles 200mg  po tiD prn cough #30 RF0 to her pharmacy of choice.  Promethazine dm 6.25/15mg / 59ml po qid prn cough #118 RF0 cautioned may cause drowsiness, avoid driving after taking or drinking alcohol before or after administration.  Albuterol MDI at home 1-2 puffs po q4-6h prn protracted cough/wheeze.  Cough lozenges po q2h prn cough.  Hydrate with water, honey with lemon, salty soup broth.  Avoid large portions dairy can increase mucous production. Viruses are the most common cause of bronchial inflammation in otherwise healthy adults with acute bronchitis.  The appearance of sputum is not predictive of whether a bacterial infection is present.  Purulent sputum is most often caused by viral infections.  There are a small portion of those caused by non-viral agents being Mycoplama pneumonia.  Microscopic examination or C&S of sputum in the healthy adult with acute bronchitis is generally not helpful (usually negative or normal respiratory flora) other considerations being cough from upper respiratory tract infections, sinusitis or allergic syndromes (mild asthma or viral pneumonia).  Differential Diagnoses:  reactive airway disease (asthma, allergic aspergillosis (eosinophilia), chronic bronchitis, respiratory infection (sinusitis, common cold, pneumonia), congestive heart failure, reflux esophagitis, bronchogenic tumor, aspiration syndromes and/or exposure to pulmonary irritants/smoke. Without high fever, severe dyspnea, lack of physical findings or other risk factors, I will hold on a chest radiograph and CBC at this time.  I discussed that  approximately 50% of patients with acute bronchitis have a cough that lasts up to three weeks, and 25% for over a month.  Tylenol 500mg  one to two tablets every four to six hours as needed for fever or myalgias.  No aspirin. Exitcare handout on viral URI/cough and inhaler use given to patient.  ER if hemopthysis, SOB, worst chest pain of life.   Patient instructed to follow up in one week or sooner if symptoms worsen.  Patient verbalized agreement and understanding of treatment plan.  P2:  hand washing and cover cough

## 2018-09-29 ENCOUNTER — Ambulatory Visit: Payer: Self-pay | Admitting: *Deleted

## 2018-09-29 DIAGNOSIS — R300 Dysuria: Secondary | ICD-10-CM

## 2018-09-29 LAB — POCT URINALYSIS DIPSTICK
Bilirubin, UA: NEGATIVE
GLUCOSE UA: NEGATIVE
Ketones, POC UA: NEGATIVE mg/dL
Leukocytes, UA: NEGATIVE
Nitrite, UA: NEGATIVE
Protein, UA: NEGATIVE
RBC UA: NEGATIVE
SPEC GRAV UA: 1.02 (ref 1.010–1.025)
Urobilinogen, UA: 0.2 E.U./dL
pH, UA: 6 (ref 5.0–8.0)

## 2018-09-29 NOTE — Progress Notes (Signed)
Pt c/o dysuria, urinary frequency/burning/urgency x3 days. POC UA completed, WNL. Advised pt to hydrate well with water to keep urine clear to pale yellow. May use AZO otc for burning/pain. Appt made in NP clinic for Thurs for recheck of this and for c/o sore throat.

## 2018-10-01 NOTE — Progress Notes (Signed)
Pt no show for Thursday clinic appt.

## 2018-10-01 NOTE — Progress Notes (Signed)
Noted will follow up with patient next week next NP Clinic day.  Repeat dipstick urinalysis if new or worsening urinary symptoms RN Rolly SalterHaley notified

## 2018-10-15 DIAGNOSIS — F17200 Nicotine dependence, unspecified, uncomplicated: Secondary | ICD-10-CM | POA: Insufficient documentation

## 2018-10-15 DIAGNOSIS — F419 Anxiety disorder, unspecified: Secondary | ICD-10-CM | POA: Insufficient documentation

## 2018-11-03 ENCOUNTER — Telehealth: Payer: Self-pay | Admitting: Registered Nurse

## 2018-11-03 ENCOUNTER — Encounter: Payer: Self-pay | Admitting: Registered Nurse

## 2018-11-03 MED ORDER — PROMETHAZINE-DM 6.25-15 MG/5ML PO SOLN: 5.0000 mL | Freq: Four times a day (QID) | ORAL | 0 refills | Status: DC | PRN

## 2018-11-03 MED ORDER — BENZONATATE 200 MG PO CAPS: 200.0000 mg | ORAL_CAPSULE | Freq: Three times a day (TID) | ORAL | 1 refills | Status: DC | PRN

## 2018-11-03 NOTE — Telephone Encounter (Signed)
Patient requested refill of tessalon pearles and promethazine DM syrup from [facility] as cough worsening with spring allergies.  Electronic Rx sent to her pharmacy of choice and she is to follow up with re-evaluation if fever/chills/productive opaque sputum/shortness of breath/dyspnea.  Patient verbalized understanding information/instructions agreed with plan of care and had no further questions at this time.

## 2018-11-05 ENCOUNTER — Telehealth: Payer: Self-pay | Admitting: Registered Nurse

## 2018-11-05 ENCOUNTER — Encounter: Payer: Self-pay | Admitting: Registered Nurse

## 2018-11-05 MED ORDER — PROMETHAZINE-DM 6.25-15 MG/5ML PO SOLN: 5.0000 mL | Freq: Four times a day (QID) | ORAL | 0 refills | Status: DC | PRN

## 2018-11-05 MED ORDER — FLUTICASONE PROPIONATE 50 MCG/ACT NA SUSP: 1.0000 | Freq: Two times a day (BID) | NASAL | 1 refills | Status: DC

## 2018-11-05 MED ORDER — SALINE SPRAY 0.65 % NA SOLN: 2.0000 | NASAL | 0 refills | Status: DC

## 2018-11-05 MED ORDER — PROMETHAZINE-DM 6.25-15 MG/5ML PO SYRP: 5.0000 mL | ORAL_SOLUTION | Freq: Four times a day (QID) | ORAL | 0 refills | Status: AC | PRN

## 2018-11-05 NOTE — Telephone Encounter (Signed)
Patient reported pharmacy did not receive promethazine dm refill Rx but she was able to pick up her tessalon pearles.  Discussed with patient I would resent Rx electronically to her pharmacy of choice again today.  Patient verbalized understanding information/instructions, agreed with plan of care and had no further questions at this time.

## 2018-12-31 ENCOUNTER — Ambulatory Visit: Payer: Self-pay | Admitting: Registered Nurse

## 2018-12-31 ENCOUNTER — Other Ambulatory Visit: Payer: Self-pay

## 2018-12-31 ENCOUNTER — Encounter: Payer: Self-pay | Admitting: Registered Nurse

## 2018-12-31 VITALS — BP 99/62 | HR 73 | Temp 97.6°F

## 2018-12-31 DIAGNOSIS — J0101 Acute recurrent maxillary sinusitis: Secondary | ICD-10-CM

## 2018-12-31 DIAGNOSIS — H6691 Otitis media, unspecified, right ear: Secondary | ICD-10-CM

## 2018-12-31 DIAGNOSIS — J302 Other seasonal allergic rhinitis: Secondary | ICD-10-CM

## 2018-12-31 DIAGNOSIS — M542 Cervicalgia: Secondary | ICD-10-CM

## 2018-12-31 DIAGNOSIS — F17200 Nicotine dependence, unspecified, uncomplicated: Secondary | ICD-10-CM

## 2018-12-31 MED ORDER — IBUPROFEN 800 MG PO TABS: 800.0000 mg | ORAL_TABLET | Freq: Three times a day (TID) | ORAL | 0 refills | Status: AC | PRN

## 2018-12-31 MED ORDER — MENTHOL (TOPICAL ANALGESIC) 4 % EX GEL: 1.0000 "application " | Freq: Four times a day (QID) | CUTANEOUS | 0 refills | Status: AC | PRN

## 2018-12-31 MED ORDER — CYCLOBENZAPRINE HCL 10 MG PO TABS: 5.0000 mg | ORAL_TABLET | Freq: Three times a day (TID) | ORAL | 0 refills | Status: AC | PRN

## 2018-12-31 MED ORDER — AMOXICILLIN-POT CLAVULANATE 875-125 MG PO TABS: 1.0000 | ORAL_TABLET | Freq: Two times a day (BID) | ORAL | 0 refills | Status: AC

## 2018-12-31 NOTE — Patient Instructions (Addendum)
Tobacco Use Disorder Tobacco use disorder (TUD) occurs when a person craves, seeks, and uses tobacco, regardless of the consequences. This disorder can cause problems with mental and physical health. It can affect your ability to have healthy relationships, and it can keep you from meeting your responsibilities at work, home, or school. Tobacco may be:  Smoked as a cigarette or cigar.  Inhaled using e-cigarettes.  Smoked in a pipe or hookah.  Chewed as smokeless tobacco.  Inhaled into the nostrils as snuff. Tobacco products contain a dangerous chemical called nicotine, which is very addictive. Nicotine triggers hormones that make the body feel stimulated and works on areas of the brain that make you feel good. These effects can make it hard for people to quit nicotine. Tobacco contains many other unsafe chemicals that can damage almost every organ in the body. Smoking tobacco also puts others in danger due to fire risk and possible health problems caused by breathing in secondhand smoke. What are the signs or symptoms? Symptoms of TUD may include:  Being unable to slow down or stop your tobacco use.  Spending an abnormal amount of time getting or using tobacco.  Craving tobacco. Cravings may last for up to 6 months after quitting.  Tobacco use that: ? Interferes with your work, school, or home life. ? Interferes with your personal and social relationships. ? Makes you give up activities that you once enjoyed or found important.  Using tobacco even though you know that it is: ? Dangerous or bad for your health or someone else's health. ? Causing problems in your life.  Needing more and more of the substance to get the same effect (developing tolerance).  Experiencing unpleasant symptoms if you do not use the substance (withdrawal). Withdrawal symptoms may include: ? Depressed, anxious, or irritable mood. ? Difficulty concentrating. ? Increased appetite. ? Restlessness or trouble  sleeping.  Using the substance to avoid withdrawal. How is this diagnosed? This condition may be diagnosed based on:  Your current and past tobacco use. Your health care provider may ask questions about how your tobacco use affects your life.  A physical exam. You may be diagnosed with TUD if you have at least two symptoms within a 12-month period. How is this treated? This condition is treated by stopping tobacco use. Many people are unable to quit on their own and need help. Treatment may include:  Nicotine replacement therapy (NRT). NRT provides nicotine without the other harmful chemicals in tobacco. NRT gradually lowers the dosage of nicotine in the body and reduces withdrawal symptoms. NRT is available as: ? Over-the-counter gums, lozenges, and skin patches. ? Prescription mouth inhalers and nasal sprays.  Medicine that acts on the brain to reduce cravings and withdrawal symptoms.  A type of talk therapy that examines your triggers for tobacco use, how to avoid them, and how to cope with cravings (behavioral therapy).  Hypnosis. This may help with withdrawal symptoms.  Joining a support group for others coping with TUD. The best treatment for TUD is usually a combination of medicine, talk therapy, and support groups. Recovery can be a long process. Many people start using tobacco again after stopping (relapse). If you relapse, it does not mean that treatment will not work. Follow these instructions at home:  Lifestyle  Do not use any products that contain nicotine or tobacco, such as cigarettes and e-cigarettes.  Avoid things that trigger tobacco use as much as you can. Triggers include people and situations that usually cause you   to use tobacco.  Avoid drinks that contain caffeine, including coffee. These may worsen some withdrawal symptoms.  Find ways to manage stress. Wanting to smoke may cause stress, and stress can make you want to smoke. Relaxation techniques such as  deep breathing, meditation, and yoga may help.  Attend support groups as needed. These groups are an important part of long-term recovery for many people. General instructions  Take over-the-counter and prescription medicines only as told by your health care provider.  Check with your health care provider before taking any new prescription or over-the-counter medicines.  Decide on a friend, family member, or smoking quit-line (such as 1-800-QUIT-NOW in the [place] that you can call or text when you feel the urge to smoke or when you need help coping with cravings.  Keep all follow-up visits as told by your health care provider and therapist. This is important. Contact a health care provider if:  You are not able to take your medicines as prescribed.  Your symptoms get worse, even with treatment. Summary  Tobacco use disorder (TUD) occurs when a person craves, seeks, and uses tobacco regardless of the consequences.  This condition may be diagnosed based on your current and past tobacco use and a physical exam.  Many people are unable to quit on their own and need help. Recovery can be a long process.  The most effective treatment for TUD is usually a combination of medicine, talk therapy, and support groups. This information is not intended to replace advice given to you by your health care provider. Make sure you discuss any questions you have with your health care provider. Document Released: 04/17/2004 Document Revised: 07/30/2017 Document Reviewed: 07/30/2017 Elsevier Interactive Patient Education  2019 [employer] Allergic Rhinitis, Adult Allergic rhinitis is an allergic reaction that affects the mucous membrane inside the nose. It causes sneezing, a runny or stuffy nose, and the feeling of mucus going down the back of the throat (postnasal drip). Allergic rhinitis can be mild to severe. There are two types of allergic rhinitis:  Seasonal. This type is also called hay fever. It  happens only during certain seasons.  Perennial. This type can happen at any time of the year. What are the causes? This condition happens when the body's defense system (immune system) responds to certain harmless substances called allergens as though they were germs.  Seasonal allergic rhinitis is triggered by pollen, which can come from grasses, trees, and weeds. Perennial allergic rhinitis may be caused by:  House dust mites.  Pet dander.  Mold spores. What are the signs or symptoms? Symptoms of this condition include:  Sneezing.  Runny or stuffy nose (nasal congestion).  Postnasal drip.  Itchy nose.  Tearing of the eyes.  Trouble sleeping.  Daytime sleepiness. How is this diagnosed? This condition may be diagnosed based on:  Your medical history.  A physical exam.  Tests to check for related conditions, such as: ? Asthma. ? Pink eye. ? Ear infection. ? Upper respiratory infection.  Tests to find out which allergens trigger your symptoms. These may include skin or blood tests. How is this treated? There is no cure for this condition, but treatment can help control symptoms. Treatment may include:  Taking medicines that block allergy symptoms, such as antihistamines. Medicine may be given as a shot, nasal spray, or pill.  Avoiding the allergen.  Desensitization. This treatment involves getting ongoing shots until your body becomes less sensitive to the allergen. This treatment may be done if other  treatments do not help.  If taking medicine and avoiding the allergen does not work, new, stronger medicines may be prescribed. Follow these instructions at home:  Find out what you are allergic to. Common allergens include smoke, dust, and pollen.  Avoid the things you are allergic to. These are some things you can do to help avoid allergens: ? Replace carpet with wood, tile, or vinyl flooring. Carpet can trap dander and dust. ? Do not smoke. Do not allow  smoking in your home. ? Change your heating and air conditioning filter at least once a month. ? During allergy season:  Keep windows closed as much as possible.  Plan outdoor activities when pollen counts are lowest. This is usually during the evening hours.  When coming indoors, change clothing and shower before sitting on furniture or bedding.  Take over-the-counter and prescription medicines only as told by your health care provider.  Keep all follow-up visits as told by your health care provider. This is important. Contact a health care provider if:  You have a fever.  You develop a persistent cough.  You make whistling sounds when you breathe (you wheeze).  Your symptoms interfere with your normal daily activities. Get help right away if:  You have shortness of breath. Summary  This condition can be managed by taking medicines as directed and avoiding allergens.  Contact your health care provider if you develop a persistent cough or fever.  During allergy season, keep windows closed as much as possible. This information is not intended to replace advice given to you by your health care provider. Make sure you discuss any questions you have with your health care provider. Document Released: 05/07/2001 Document Revised: 09/19/2016 Document Reviewed: 09/19/2016 Elsevier Interactive Patient Education  2019 [employer]. Otitis Media, Adult  Otitis media occurs when there is inflammation and fluid in the middle ear. Your middle ear is a part of the ear that contains bones for hearing as well as air that helps send sounds to your brain. What are the causes? This condition is caused by a blockage in the eustachian tube. This tube drains fluid from the ear to the back of the nose (nasopharynx). A blockage in this tube can be caused by an object or by swelling (edema) in the tube. Problems that can cause a blockage include:  A cold or other upper respiratory infection.   Allergies.  An irritant, such as tobacco smoke.  Enlarged adenoids. The adenoids are areas of soft tissue located high in the back of the throat, behind the nose and the roof of the mouth.  A mass in the nasopharynx.  Damage to the ear caused by pressure changes (barotrauma). What are the signs or symptoms? Symptoms of this condition include:  Ear pain.  A fever.  Decreased hearing.  A headache.  Tiredness (lethargy).  Fluid leaking from the ear.  Ringing in the ear. How is this diagnosed? This condition is diagnosed with a physical exam. During the exam your health care provider will use an instrument called an otoscope to look into your ear and check for redness, swelling, and fluid. He or she will also ask about your symptoms. Your health care provider may also order tests, such as:  A test to check the movement of the eardrum (pneumatic otoscopy). This test is done by squeezing a small amount of air into the ear.  A test that changes air pressure in the middle ear to check how well the eardrum moves and  whether the eustachian tube is working (tympanogram). How is this treated? This condition usually goes away on its own within 3-5 days. But if the condition is caused by a bacteria infection and does not go away own its own, or keeps coming back, your health care provider may:  Prescribe antibiotic medicines to treat the infection.  Prescribe or recommend medicines to control pain. Follow these instructions at home:  Take over-the-counter and prescription medicines only as told by your health care provider.  If you were prescribed an antibiotic medicine, take it as told by your health care provider. Do not stop taking the antibiotic even if you start to feel better.  Keep all follow-up visits as told by your health care provider. This is important. Contact a health care provider if:  You have bleeding from your nose.  There is a lump on your neck.  You are not  getting better in 5 days.  You feel worse instead of better. Get help right away if:  You have severe pain that is not controlled with medicine.  You have swelling, redness, or pain around your ear.  You have stiffness in your neck.  A part of your face is paralyzed.  The bone behind your ear (mastoid) is tender when you touch it.  You develop a severe headache. Summary  Otitis media is redness, soreness, and swelling of the middle ear.  This condition usually goes away on its own within 3-5 days.  If the problem does not go away in 3-5 days, your health care provider may prescribe or recommend medicines to treat your symptoms.  If you were prescribed an antibiotic medicine, take it as told by your health care provider. This information is not intended to replace advice given to you by your health care provider. Make sure you discuss any questions you have with your health care provider. Document Released: 05/17/2004 Document Revised: 08/02/2016 Document Reviewed: 08/02/2016 Elsevier Interactive Patient Education  2019 [employer]. How to Perform a Sinus Rinse A sinus rinse is a home treatment that is used to rinse your sinuses with a sterile mixture of salt and water (saline solution). Sinuses are air-filled spaces in your skull behind the bones of your face and forehead that open into your nasal cavity. A sinus rinse can help to clear mucus, dirt, dust, or pollen from your nasal cavity. You may do a sinus rinse when you have a cold, a virus, nasal allergy symptoms, a sinus infection, or stuffiness in your nose or sinuses. Talk with your health care provider about whether a sinus rinse might help you. What are the risks? A sinus rinse is generally safe and effective. However, there are a few risks, which include:  A burning sensation in your sinuses. This may happen if you do not make the saline solution as directed. Be sure to follow all directions when making the saline  solution.  Nasal irritation.  Infection from contaminated water. This is rare, but possible. Do not do a sinus rinse if you have had ear or nasal surgery, ear infection, or blocked ears. Supplies needed:  Saline solution or powder.  Distilled or sterile water may be needed to mix with saline powder. ? You may use boiled and cooled tap water. Boil tap water for 5 minutes; cool until it is lukewarm. Use within 24 hours. ? Do not use regular tap water to mix with the saline solution.  Neti pot or nasal rinse bottle. These supplies release the saline solution  into your nose and through your sinuses. Neti pots and nasal rinse bottles can be purchased at [employer], a health food store, or online. How to perform a sinus rinse  1. Wash your hands with soap and water. 2. Wash your device according to the directions that came with the product and then dry it. 3. Use the solution that comes with your product or one that is sold separately in stores. Follow the mixing directions on the package if you need to mix with sterile or distilled water. 4. Fill the device with the amount of saline solution noted in the device instructions. 5. Stand over a sink and tilt your head sideways over the sink. 6. Place the spout of the device in your upper nostril (the one closer to the ceiling). 7. Gently pour or squeeze the saline solution into your nasal cavity. The liquid should drain out from the lower nostril if you are not too congested. 8. While rinsing, breathe through your open mouth. 9. Gently blow your nose to clear any mucus and rinse solution. Blowing too hard may cause ear pain. 10. Repeat in your other nostril. 11. Clean and rinse your device with clean water and then air-dry it. Talk with your health care provider or pharmacist if you have questions about how to do a sinus rinse. Summary  A sinus rinse is a home treatment that is used to rinse your sinuses with a sterile mixture of salt  and water (saline solution).  A sinus rinse is generally safe and effective. Follow all instructions carefully.  Before doing a sinus rinse, talk with your health care provider about whether it would be helpful for you. This information is not intended to replace advice given to you by your health care provider. Make sure you discuss any questions you have with your health care provider. Document Released: 03/09/2014 Document Revised: 06/09/2017 Document Reviewed: 06/09/2017 Elsevier Interactive Patient Education  2019 [employer] Sinusitis, Adult Sinusitis is inflammation of your sinuses. Sinuses are hollow spaces in the bones around your face. Your sinuses are located:  Around your eyes.  In the middle of your forehead.  Behind your nose.  In your cheekbones. Mucus normally drains out of your sinuses. When your nasal tissues become inflamed or swollen, mucus can become trapped or blocked. This allows bacteria, viruses, and fungi to grow, which leads to infection. Most infections of the sinuses are caused by a virus. Sinusitis can develop quickly. It can last for up to 4 weeks (acute) or for more than 12 weeks (chronic). Sinusitis often develops after a cold. What are the causes? This condition is caused by anything that creates swelling in the sinuses or stops mucus from draining. This includes:  Allergies.  Asthma.  Infection from bacteria or viruses.  Deformities or blockages in your nose or sinuses.  Abnormal growths in the nose (nasal polyps).  Pollutants, such as chemicals or irritants in the air.  Infection from fungi (rare). What increases the risk? You are more likely to develop this condition if you:  Have a weak body defense system (immune system).  Do a lot of swimming or diving.  Overuse nasal sprays.  Smoke. What are the signs or symptoms? The main symptoms of this condition are pain and a feeling of pressure around the affected sinuses. Other symptoms  include:  Stuffy nose or congestion.  Thick drainage from your nose.  Swelling and warmth over the affected sinuses.  Headache.  Upper toothache.  A  cough that may get worse at night.  Extra mucus that collects in the throat or the back of the nose (postnasal drip).  Decreased sense of smell and taste.  Fatigue.  A fever.  Sore throat.  Bad breath. How is this diagnosed? This condition is diagnosed based on:  Your symptoms.  Your medical history.  A physical exam.  Tests to find out if your condition is acute or chronic. This may include: ? Checking your nose for nasal polyps. ? Viewing your sinuses using a device that has a light (endoscope). ? Testing for allergies or bacteria. ? Imaging tests, such as an MRI or CT scan. In rare cases, a bone biopsy may be done to rule out more serious types of fungal sinus disease. How is this treated? Treatment for sinusitis depends on the cause and whether your condition is chronic or acute.  If caused by a virus, your symptoms should go away on their own within 10 days. You may be given medicines to relieve symptoms. They include: ? Medicines that shrink swollen nasal passages (topical intranasal decongestants). ? Medicines that treat allergies (antihistamines). ? A spray that eases inflammation of the nostrils (topical intranasal corticosteroids). ? Rinses that help get rid of thick mucus in your nose (nasal saline washes).  If caused by bacteria, your health care provider may recommend waiting to see if your symptoms improve. Most bacterial infections will get better without antibiotic medicine. You may be given antibiotics if you have: ? A severe infection. ? A weak immune system.  If caused by narrow nasal passages or nasal polyps, you may need to have surgery. Follow these instructions at home: Medicines  Take, use, or apply over-the-counter and prescription medicines only as told by your health care provider. These  may include nasal sprays.  If you were prescribed an antibiotic medicine, take it as told by your health care provider. Do not stop taking the antibiotic even if you start to feel better. Hydrate and humidify   Drink enough fluid to keep your urine pale yellow. Staying hydrated will help to thin your mucus.  Use a cool mist humidifier to keep the humidity level in your home above 50%.  Inhale steam for 10-15 minutes, 3-4 times a day, or as told by your health care provider. You can do this in the bathroom while a hot shower is running.  Limit your exposure to cool or dry air. Rest  Rest as much as possible.  Sleep with your head raised (elevated).  Make sure you get enough sleep each night. General instructions   Apply a warm, moist washcloth to your face 3-4 times a day or as told by your health care provider. This will help with discomfort.  Wash your hands often with soap and water to reduce your exposure to germs. If soap and water are not available, use hand sanitizer.  Do not smoke. Avoid being around people who are smoking (secondhand smoke).  Keep all follow-up visits as told by your health care provider. This is important. Contact a health care provider if:  You have a fever.  Your symptoms get worse.  Your symptoms do not improve within 10 days. Get help right away if:  You have a severe headache.  You have persistent vomiting.  You have severe pain or swelling around your face or eyes.  You have vision problems.  You develop confusion.  Your neck is stiff.  You have trouble breathing. Summary  Sinusitis is soreness and  inflammation of your sinuses. Sinuses are hollow spaces in the bones around your face.  This condition is caused by nasal tissues that become inflamed or swollen. The swelling traps or blocks the flow of mucus. This allows bacteria, viruses, and fungi to grow, which leads to infection.  If you were prescribed an antibiotic medicine,  take it as told by your health care provider. Do not stop taking the antibiotic even if you start to feel better.  Keep all follow-up visits as told by your health care provider. This is important. This information is not intended to replace advice given to you by your health care provider. Make sure you discuss any questions you have with your health care provider. Document Released: 08/12/2005 Document Revised: 01/12/2018 Document Reviewed: 01/12/2018 Elsevier Interactive Patient Education  2019 [employer]  Cervical Strain and Sprain Rehab Ask your health care provider which exercises are safe for you. Do exercises exactly as told by your health care provider and adjust them as directed. It is normal to feel mild stretching, pulling, tightness, or discomfort as you do these exercises, but you should stop right away if you feel sudden pain or your pain gets worse.Do not begin these exercises until told by your health care provider. Stretching and range of motion exercises These exercises warm up your muscles and joints and improve the movement and flexibility of your neck. These exercises also help to relieve pain, numbness, and tingling. Exercise A: Cervical side bend  1. Using good posture, sit on a stable chair or stand up. 2. Without moving your shoulders, slowly tilt your left / right ear to your shoulder until you feel a stretch in your neck muscles. You should be looking straight ahead. 3. Hold for ___15_______ seconds. 4. Repeat with the other side of your neck. Repeat _____3_____ times. Complete this exercise ___2_______ times a day. Exercise B: Cervical rotation  1. Using good posture, sit on a stable chair or stand up. 2. Slowly turn your head to the side as if you are looking over your left / right shoulder. ? Keep your eyes level with the ground. ? Stop when you feel a stretch along the side and the back of your neck. 3. Hold for __15_______ seconds. 4. Repeat this by  turning to your other side. Repeat ___3_______ times. Complete this exercise ____2______ times a day. Exercise C: Thoracic extension and pectoral stretch 12. Roll a towel or a small blanket so it is about 4 inches (10 cm) in diameter. 13. Lie down on your back on a firm surface. 14. Put the towel lengthwise, under your spine in the middle of your back. It should not be not under your shoulder blades. The towel should line up with your spine from your middle back to your lower back. 15. Put your hands behind your head and let your elbows fall out to your sides. 16. Hold for ____15______ seconds. Repeat _____3_____ times. Complete this exercise ______2____ times a day. Strengthening exercises These exercises build strength and endurance in your neck. Endurance is the ability to use your muscles for a long time, even after your muscles get tired. Exercise D: Upper cervical flexion, isometric 1. Lie on your back with a thin pillow behind your head and a small rolled-up towel under your neck. 2. Gently tuck your chin toward your chest and nod your head down to look toward your feet. Do not lift your head off the pillow. 3. Hold for _____15_____ seconds. 4. Release the tension  slowly. Relax your neck muscles completely before you repeat this exercise. Repeat ____3______ times. Complete this exercise _____2_____ times a day. Exercise E: Cervical extension, isometric  1. Stand about 6 inches (15 cm) away from a wall, with your back facing the wall. 2. Place a soft object, about 6-8 inches (15-20 cm) in diameter, between the back of your head and the wall. A soft object could be a small pillow, a ball, or a folded towel. 3. Gently tilt your head back and press into the soft object. Keep your jaw and forehead relaxed. 4. Hold for ___15_______ seconds. 5. Release the tension slowly. Relax your neck muscles completely before you repeat this exercise. Repeat ____3______ times. Complete this exercise  _____2_____ times a day. Posture and body mechanics Body mechanics refers to the movements and positions of your body while you do your daily activities. Posture is part of body mechanics. Good posture and healthy body mechanics can help to relieve stress in your body's tissues and joints. Good posture means that your spine is in its natural S-curve position (your spine is neutral), your shoulders are pulled back slightly, and your head is not tipped forward. The following are general guidelines for applying improved posture and body mechanics to your everyday activities. Standing   When standing, keep your spine neutral and keep your feet about hip-width apart. Keep a slight bend in your knees. Your ears, shoulders, and hips should line up.  When you do a task in which you stand in one place for a long time, place one foot up on a stable object that is 2-4 inches (5-10 cm) high, such as a footstool. This helps keep your spine neutral. Sitting   When sitting, keep your spine neutral and your keep feet flat on the floor. Use a footrest, if necessary, and keep your thighs parallel to the floor. Avoid rounding your shoulders, and avoid tilting your head forward.  When working at a desk or a computer, keep your desk at a height where your hands are slightly lower than your elbows. Slide your chair under your desk so you are close enough to maintain good posture.  When working at a computer, place your monitor at a height where you are looking straight ahead and you do not have to tilt your head forward or downward to look at the screen. Resting When lying down and resting, avoid positions that are most painful for you. Try to support your neck in a neutral position. You can use a contour pillow or a small rolled-up towel. Your pillow should support your neck but not push on it. This information is not intended to replace advice given to you by your health care provider. Make sure you discuss any  questions you have with your health care provider. Document Released: 08/12/2005 Document Revised: 04/18/2016 Document Reviewed: 07/19/2015 [employer] Interactive Patient Education  2019 [employer]  Cervical Sprain  A cervical sprain is a stretch or tear in one or more of the tough, cord-like tissues that connect bones (ligaments) in the neck. Cervical sprains can range from mild to severe. Severe cervical sprains can cause the spinal bones (vertebrae) in the neck to be unstable. This can lead to spinal cord damage and can result in serious nervous system problems. The amount of time that it takes for a cervical sprain to get better depends on the cause and extent of the injury. Most cervical sprains heal in 4-6 weeks. What are the causes? Cervical sprains may be  caused by an injury (trauma), such as from a motor vehicle accident, a fall, or sudden forward and backward whipping movement of the head and neck (whiplash injury). Mild cervical sprains may be caused by wear and tear over time, such as from poor posture, sitting in a chair that does not provide support, or looking up or down for long periods of time. What increases the risk? The following factors may make you more likely to develop this condition:  Participating in activities that have a high risk of trauma to the neck. These include contact sports, auto racing, gymnastics, and diving.  Taking risks when driving or riding in a motor vehicle, such as speeding.  Having osteoarthritis of the spine.  Having poor strength and flexibility of the neck.  A previous neck injury.  Having poor posture.  Spending a lot of time in certain positions that put stress on the neck, such as sitting at a computer for long periods of time. What are the signs or symptoms? Symptoms of this condition include:  Pain, soreness, stiffness, tenderness, swelling, or a burning sensation in the front, back, or sides of the neck.  Sudden tightening of  neck muscles that you cannot control (muscle spasms).  Pain in the shoulders or upper back.  Limited ability to move the neck.  Headache.  Dizziness.  Nausea.  Vomiting.  Weakness, numbness, or tingling in a hand or an arm. Symptoms may develop right away after injury, or they may develop over a few days. In some cases, symptoms may go away with treatment and return (recur) over time. How is this diagnosed? This condition may be diagnosed based on:  Your medical history.  Your symptoms.  Any recent injuries or known neck problems that you have, such as arthritis in the neck.  A physical exam.  Imaging tests, such as: ? X-rays. ? MRI. ? CT scan. How is this treated? This condition is treated by resting and icing the injured area and doing physical therapy exercises. Depending on the severity of your condition, treatment may also include:  Keeping your neck in place (immobilized) for periods of time. This may be done using: ? A cervical collar. This supports your chin and the back of your head. ? A cervical traction device. This is a sling that holds up your head. This removes weight and pressure from your neck, and it may help to relieve pain.  Medicines that help to relieve pain and inflammation.  Medicines that help to relax your muscles (muscle relaxants).  Surgery. This is rare. Follow these instructions at home: If you have a cervical collar:   Wear it as told by your health care provider. Do not remove the collar unless instructed by your health care provider.  Ask your health care provider before you make any adjustments to your collar.  If you have long hair, keep it outside of the collar.  Ask your health care provider if you can remove the collar for cleaning and bathing. If you are allowed to remove the collar for cleaning or bathing: ? Follow instructions from your health care provider about how to remove the collar safely. ? Clean the collar by  wiping it with mild soap and water and drying it completely. ? If your collar has removable pads, remove them every 1-2 days and wash them by hand with soap and water. Let them air-dry completely before you put them back in the collar. ? Check your skin under the collar for irritation  or sores. If you see any, tell your health care provider. Managing pain, stiffness, and swelling   If directed, use a cervical traction device as told by your health care provider.  If directed, apply heat to the affected area before you do your physical therapy or as often as told by your health care provider. Use the heat source that your health care provider recommends, such as a moist heat pack or a heating pad. ? Place a towel between your skin and the heat source. ? Leave the heat on for 20-30 minutes. ? Remove the heat if your skin turns bright red. This is especially important if you are unable to feel pain, heat, or cold. You may have a greater risk of getting burned.  If directed, put ice on the affected area: ? Put ice in a plastic bag. ? Place a towel between your skin and the bag. ? Leave the ice on for 20 minutes, 2-3 times a day. Activity  Do not drive while wearing a cervical collar. If you do not have a cervical collar, ask your health care provider if it is safe to drive while your neck heals.  Do not drive or use heavy machinery while taking prescription pain medicine or muscle relaxants, unless your health care provider approves.  Do not lift anything that is heavier than 10 lb (4.5 kg) until your health care provider tells you that it is safe.  Rest as directed by your health care provider. Avoid positions and activities that make your symptoms worse. Ask your health care provider what activities are safe for you.  If physical therapy was prescribed, do exercises as told by your health care provider or physical therapist. General instructions  Take over-the-counter and prescription  medicines only as told by your health care provider.  Do not use any products that contain nicotine or tobacco, such as cigarettes and e-cigarettes. These can delay healing. If you need help quitting, ask your health care provider.  Keep all follow-up visits as told by your health care provider or physical therapist. This is important. How is this prevented? To prevent a cervical sprain from happening again:  Use and maintain good posture. Make any needed adjustments to your workstation to help you use good posture.  Exercise regularly as directed by your health care provider or physical therapist.  Avoid risky activities that may cause a cervical sprain. Contact a health care provider if:  You have symptoms that get worse or do not get better after 2 weeks of treatment.  You have pain that gets worse or does not get better with medicine.  You develop new, unexplained symptoms.  You have sores or irritated skin on your neck from wearing your cervical collar. Get help right away if:  You have severe pain.  You develop numbness, tingling, or weakness in any part of your body.  You cannot move a part of your body (you have paralysis).  You have neck pain along with: ? Severe dizziness. ? Headache. Summary  A cervical sprain is a stretch or tear in one or more of the tough, cord-like tissues that connect bones (ligaments) in the neck.  Cervical sprains may be caused by an injury (trauma), such as from a motor vehicle accident, a fall, or sudden forward and backward whipping movement of the head and neck (whiplash injury).  Symptoms may develop right away after injury, or they may develop over a few days.  This condition is treated by resting and  icing the injured area and doing physical therapy exercises. This information is not intended to replace advice given to you by your health care provider. Make sure you discuss any questions you have with your health care provider.  Document Released: 06/09/2007 Document Revised: 04/10/2016 Document Reviewed: 04/10/2016 [employer] Interactive Patient Education  2019 [employer].

## 2018-12-31 NOTE — Progress Notes (Signed)
Subjective:    Patient ID: Rebecca Sweeney, female    DOB: 07-23-1961, 58 y.o.   MRN: [id number]  58y/o Caucasian female pt c/o R otalgia x2 weeks. Denies fever/chills/rash/dyspnea/wheezing/trauma/ear drainage or muffled sounds. Reports when she turns her head quickly to the R, she becomes dizzy, room spinning. Feels fine lying down.   Some sinus pressure, upper gum pain intermittent also.  Using her nasal saline frequently and helping.  Grandkids from [place] have been living with them for over a month.  Her first week back at work.  Last augmentin and prednisone Rx Apr 2019 worked well.  Trial chantix stopped at 1 month due to bad dreams.  Has increased number of cigarettes per day due to stressors of covid-19 pandemic, change in work schedule/decreased hours, sheltering at home.  Having some neck pain/muscle tightness would like refill on motrin and cyclobenzaprine last filled Apr 2019.  Denied loss of bowel/bladder control, saddle paresthesias, arm/leg weakness.     Review of Systems  Constitutional: Positive for activity change. Negative for appetite change, chills, diaphoresis, fatigue and fever.  HENT: Positive for congestion, ear pain, hearing loss, postnasal drip, rhinorrhea, sinus pressure and sinus pain. Negative for dental problem, drooling, ear discharge, facial swelling, mouth sores, nosebleeds, sneezing, trouble swallowing and voice change.   Eyes: Negative for photophobia and visual disturbance.  Respiratory: Positive for cough. Negative for choking, chest tightness, shortness of breath, wheezing and stridor.   Cardiovascular: Negative for chest pain and palpitations.  Gastrointestinal: Negative for diarrhea, nausea and vomiting.  Endocrine: Negative for cold intolerance and heat intolerance.  Genitourinary: Negative for difficulty urinating and enuresis.  Musculoskeletal: Positive for myalgias and neck pain. Negative for arthralgias, back pain, gait problem, joint swelling and neck  stiffness.  Skin: Negative for color change, pallor, rash and wound.  Allergic/Immunologic: Positive for environmental allergies. Negative for food allergies.  Neurological: Positive for dizziness. Negative for tremors, seizures, syncope, facial asymmetry, speech difficulty, weakness, light-headedness, numbness and headaches.  Hematological: Negative for adenopathy. Does not bruise/bleed easily.  Psychiatric/Behavioral: Negative for agitation, confusion and sleep disturbance.       Objective:   Physical Exam Vitals signs and nursing note reviewed.  Constitutional:      General: She is awake. She is not in acute distress.    Appearance: Normal appearance. She is well-developed and well-groomed. She is not ill-appearing, toxic-appearing or diaphoretic.  HENT:     Head: Normocephalic and atraumatic.     Jaw: There is normal jaw occlusion. No trismus.     Salivary Glands: Right salivary gland is not diffusely enlarged or tender. Left salivary gland is not diffusely enlarged or tender.     Right Ear: Hearing, ear canal and external ear normal. A middle ear effusion is present. Tympanic membrane is erythematous and bulging.     Left Ear: Hearing, ear canal and external ear normal. A middle ear effusion is present.     Nose: Mucosal edema present. No nasal deformity, septal deviation, laceration, congestion or rhinorrhea.     Right Turbinates: Enlarged and swollen. Not pale.     Left Turbinates: Enlarged and swollen. Not pale.     Right Sinus: Maxillary sinus tenderness present. No frontal sinus tenderness.     Left Sinus: Maxillary sinus tenderness present. No frontal sinus tenderness.     Mouth/Throat:     Lips: Pink. No lesions.     Mouth: Mucous membranes are moist. Mucous membranes are not pale, not dry and not cyanotic. No  injury, lacerations, oral lesions or angioedema.     Dentition: Abnormal dentition. Does not have dentures. No dental tenderness, gingival swelling, dental caries,  dental abscesses or gum lesions.     Tongue: No lesions.     Pharynx: Uvula midline. Pharyngeal swelling and posterior oropharyngeal erythema present. No oropharyngeal exudate or uvula swelling.     Tonsils: No tonsillar exudate or tonsillar abscesses. 0 on the right. 0 on the left.  Eyes:     General: Lids are normal. Allergic shiner present. No visual field deficit or scleral icterus.       Right eye: No foreign body, discharge or hordeolum.        Left eye: No foreign body, discharge or hordeolum.     Extraocular Movements: Extraocular movements intact.     Right eye: Normal extraocular motion and no nystagmus.     Left eye: Normal extraocular motion and no nystagmus.     Conjunctiva/sclera: Conjunctivae normal.     Right eye: Right conjunctiva is not injected. No chemosis, exudate or hemorrhage.    Left eye: Left conjunctiva is not injected. No chemosis, exudate or hemorrhage.    Pupils: Pupils are equal, round, and reactive to light. Pupils are equal.     Right eye: Pupil is round and reactive.     Left eye: Pupil is round and reactive.  Neck:     Musculoskeletal: Normal range of motion and neck supple. Normal range of motion. Pain with movement and muscular tenderness present. No edema, erythema, neck rigidity, crepitus, torticollis or spinous process tenderness.     Thyroid: No thyroid mass or thyromegaly.     Trachea: Trachea and phonation normal. No tracheal tenderness or tracheal deviation.     Comments: Bilateral trapezius tight/TTP  Cardiovascular:     Rate and Rhythm: Normal rate and regular rhythm.     Chest Wall: PMI is not displaced.     Pulses:          Radial pulses are 2+ on the right side and 2+ on the left side.     Heart sounds: Normal heart sounds, S1 normal and S2 normal. No murmur. No friction rub. No gallop.   Pulmonary:     Effort: Pulmonary effort is normal. No accessory muscle usage or respiratory distress.     Breath sounds: Normal breath sounds and air  entry. No stridor, decreased air movement or transmitted upper airway sounds. No decreased breath sounds, wheezing, rhonchi or rales.  Chest:     Chest wall: No tenderness.  Abdominal:     General: Abdomen is flat. There is no distension.     Palpations: Abdomen is soft.  Musculoskeletal: Normal range of motion.        General: No swelling or deformity.     Right shoulder: Normal.     Left shoulder: Normal.     Right elbow: Normal.    Left elbow: Normal.     Right hip: Normal.     Left hip: Normal.     Right knee: Normal.     Left knee: Normal.     Cervical back: She exhibits tenderness, pain and spasm. She exhibits normal range of motion, no bony tenderness, no swelling, no edema, no deformity, no laceration and normal pulse.     Thoracic back: Normal.     Lumbar back: Normal.       Back:     Right hand: Normal.     Left hand: Normal.  Right lower leg: No edema.     Left lower leg: No edema.  Lymphadenopathy:     Head:     Right side of head: No submental, submandibular, tonsillar, preauricular, posterior auricular or occipital adenopathy.     Left side of head: No submental, submandibular, tonsillar, preauricular, posterior auricular or occipital adenopathy.     Cervical: No cervical adenopathy.     Right cervical: No superficial, deep or posterior cervical adenopathy.    Left cervical: No superficial, deep or posterior cervical adenopathy.  Skin:    General: Skin is warm and dry.     Capillary Refill: Capillary refill takes less than 2 seconds.     Coloration: Skin is not ashen, cyanotic, jaundiced, mottled, pale or sallow.     Findings: No abrasion, abscess, acne, bruising, burn, ecchymosis, erythema, signs of injury, laceration, lesion, petechiae, rash or wound.     Nails: There is no clubbing.   Neurological:     General: No focal deficit present.     Mental Status: She is alert and oriented to person, place, and time. Mental status is at baseline. She is not  disoriented.     GCS: GCS eye subscore is 4. GCS verbal subscore is 5. GCS motor subscore is 6.     Cranial Nerves: Cranial nerves are intact. No cranial nerve deficit, dysarthria or facial asymmetry.     Sensory: Sensation is intact. No sensory deficit.     Motor: Motor function is intact. No weakness, tremor, atrophy, abnormal muscle tone or seizure activity.     Coordination: Coordination is intact. Coordination normal.     Gait: Gait is intact. Gait normal.     Comments: Bilateral hand grasp equal 5/5  Psychiatric:        Attention and Perception: Attention and perception normal.        Mood and Affect: Mood and affect normal.        Speech: Speech normal.        Behavior: Behavior normal. Behavior is cooperative.        Thought Content: Thought content normal.        Cognition and Memory: Cognition and memory normal.        Judgment: Judgment normal.     Maxillary tenderness bilaterally and bilateral allergic shiners; right TM erythematous bulging slight opacity 5-7 oclock air fluid level; cobblestoning posterior pharynx; nasal turbinates edema/erythema bilaterally; no cough observed in exam room; spoke full sentences without difficulty; gait sure and steady in hallway; in/out of chair without difficulty and on/off exam table     Assessment & Plan:  A-recurrent maxillary acute sinusitis, acute right otitis media and seasonal allergic rhinitis, tobacco use disorder, acute neck pain  P-Patient may use normal saline nasal spray 2 sprays each nostril q2h wa as needed given 1 bottle from clinic stock. flonase 1 spray each nostril BID at home.  Patient denied personal or family history of ENT cancer.  OTC antihistamine of choice claritin10mg  po daily.  Avoid triggers if possible.  Shower prior to bedtime if exposed to triggers.  If allergic dust/dust mites recommend mattress/pillow covers/encasements; washing linens, vacuuming, sweeping, dusting weekly.  Call or return to clinic as  needed if these symptoms worsen or fail to improve as anticipated.   Exitcare handout on allergic rhinitis and sinus rinse given to patient.  Patient verbalized understanding of instructions, agreed with plan of care and had no further questions at this time.  P2:  Avoidance and hand  washing.  flonase 1 spray each nostril BID, saline 2 sprays each nostril q2h wa prn congestion.  If no improvement with 48 hours of saline and flonase use start augmentin  po BID x 10 days #20 RF0 dispensed from [facility] to patient  Denied personal or family history of ENT cancer.  Shower BID especially prior to bed. No evidence of systemic bacterial infection, non toxic and well hydrated.  I do not see where any further testing or imaging is necessary at this time.   I will suggest supportive care, rest, good hygiene and encourage the patient to take adequate fluids.  The patient is to return to clinic or EMERGENCY ROOM if symptoms worsen or change significantly.  Exitcare handout on sinusitis and sinus rinse.  Patient verbalized agreement and understanding of treatment plan and had no further questions at this time.   P2:  Hand washing and cover cough  Supportive treatment. Augmentin  po BID x 10 days #20 RF0 dispensed from [facility] to patient  Motrin  po TID prn pain take with food #30 RF0 dispensed from [facility] to patient   No evidence of invasive bacterial infection, non toxic and well hydrated.  This is most likely self limiting viral infection.  I do not see where any further testing or imaging is necessary at this time.   I will suggest supportive care, rest, good hygiene and encourage the patient to take adequate fluids.  The patient is to return to clinic or EMERGENCY ROOM if symptoms worsen or change significantly e.g. ear pain, fever, purulent discharge from ears or bleeding.  Exitcare handout on otitis media   Patient verbalized agreement and understanding of treatment plan.    Failed chantix due to vivid  nightmares.  Has increased smoking up to 1 PPD now from 6 cigarettes per day.  Patient desires to stop but increased stressors of not working, covid-19 pandemic not helping her to quit along with extended family members living with them.  Encouraged patient to continue cutting back down consider nicotine gum/transdermal.  exitcare handout tobacco use disorder.  Patient verbalized understanding information/instructions, agreed with plan of care and had no further questions at this time.  cyclobenazeprine/flexeril  sig 1/2 to 1 tab po TID prn muscle spasms #30 RF0 dispensed from [facility].  Ibuprofen  po TID prn pain take with food #30 RF0 dispensed from [facility].  Discussed time off from work just restarted this week change in her routines and positioning of body not lifting as much when only at home reason for increased pain/muscle tightness/spasms.  Denied trauma.   Avoid alcohol intake and driving while taking cyclobenazeprine/flexeril as drowsiness common side effect.  Slow position changes as medication also lower blood pressure.  Home stretches demonstrated to patient-e.g. Arm circles, walking up wall, chest stretches, neck AROM, chin tucks, knee to chest and rock side to side on back. Self massage or professional prn, foam roller use or tennis/racquetball.  Heat/cryotherapy 15 minutes QID prn.  Consider trial thermacare patient refused at this time may return and see RN Rolly Salter if changes mind.  Given topical biofreeze gel from clinic stock may apply QID prn pain.  Consider physical therapy referral if no improvement with prescribed therapy from [facility] and/or chiropractic care.  Ensure ergonomics correct desk at work avoid repetitive motions if possible/holding phone/laptop in hand use desk/stand and/or break up lifting items into smaller loads/weights.  Patient was instructed to rest, ice, and ROM exercises.  Activity as tolerated.   Follow up if  symptoms persist or worsen especially if loss of bowel/bladder  control, arm/leg weakness and/or saddle paresthesias.  Exitcare handout on acute neck sprain and rehab exercises given to patient.  Patient verbalized agreement and understanding of treatment plan and had no further questions at this time.  P2:  Injury Prevention and Fitness.

## 2019-05-25 ENCOUNTER — Ambulatory Visit: Payer: Self-pay | Admitting: Registered Nurse

## 2019-05-25 ENCOUNTER — Other Ambulatory Visit: Payer: Self-pay

## 2019-05-25 ENCOUNTER — Encounter: Payer: Self-pay | Admitting: Registered Nurse

## 2019-05-25 VITALS — BP 110/75 | HR 71 | Temp 98.5°F

## 2019-05-25 DIAGNOSIS — B070 Plantar wart: Secondary | ICD-10-CM

## 2019-05-25 MED ORDER — SALICYLIC ACID 6 % EX GEL: Freq: Every day | CUTANEOUS | 0 refills | Status: DC

## 2019-05-25 NOTE — Progress Notes (Signed)
Subjective:    Patient ID: Rebecca Sweeney, female    DOB: [birth year], 58 y.o.   MRN: [id number]  58y/o Caucasian established female pt c/o pain to 4th toe on R foot and what appears to be a wart for "a while" but since yesterday, the pain has increased significantly. Walking and standing on R foot worsens the pain. Denies open or draining wound. States pain does not radiate, remains in toe only.   A long time ago used to have wart 5th toe right foot but she dug it out and area healed.  Denied others in household with warts/not going to the gym.  Wears shoes at work.     Review of Systems  Constitutional: Negative for activity change, appetite change, chills, diaphoresis, fatigue and fever.  HENT: Negative for trouble swallowing and voice change.   Eyes: Negative for photophobia and visual disturbance.  Cardiovascular: Negative for chest pain and leg swelling.  Gastrointestinal: Negative for abdominal pain, diarrhea, nausea and vomiting.  Endocrine: Negative for cold intolerance and heat intolerance.  Genitourinary: Negative for difficulty urinating.  Musculoskeletal: Positive for gait problem and myalgias. Negative for arthralgias, back pain, joint swelling, neck pain and neck stiffness.  Allergic/Immunologic: Positive for environmental allergies. Negative for food allergies.  Neurological: Negative for tremors, weakness and numbness.  Hematological: Negative for adenopathy. Does not bruise/bleed easily.  Psychiatric/Behavioral: Negative for agitation, confusion and sleep disturbance.       Objective:   Physical Exam Vitals signs and nursing note reviewed.  Constitutional:      General: She is awake. She is not in acute distress.    Appearance: Normal appearance. She is well-developed and well-groomed. She is not ill-appearing, toxic-appearing or diaphoretic.  HENT:     Head: Normocephalic and atraumatic.     Jaw: There is normal jaw occlusion.     Right Ear: External ear normal.      Left Ear: External ear normal.     Nose: Nose normal.     Mouth/Throat:     Mouth: Mucous membranes are moist.  Eyes:     General: Lids are normal. Vision grossly intact. Gaze aligned appropriately. No visual field deficit.    Extraocular Movements: Extraocular movements intact.     Conjunctiva/sclera: Conjunctivae normal.     Pupils: Pupils are equal, round, and reactive to light.  Neck:     Musculoskeletal: Normal range of motion and neck supple. Normal range of motion. No edema, erythema, neck rigidity, crepitus, injury, pain with movement or torticollis.     Trachea: Trachea and phonation normal.  Cardiovascular:     Rate and Rhythm: Normal rate and regular rhythm.     Pulses: Normal pulses.          Dorsalis pedis pulses are 2+ on the right side and 2+ on the left side.     Heart sounds: Normal heart sounds.  Pulmonary:     Effort: Pulmonary effort is normal. No respiratory distress.     Breath sounds: Normal breath sounds and air entry. No stridor, decreased air movement or transmitted upper airway sounds. No wheezing, rhonchi or rales.     Comments: No cough observed in exam room; spoke full sentences without difficulty; wearing cloth mask due to covid 19 pandemic Abdominal:     General: Bowel sounds are normal.     Palpations: Abdomen is soft.  Musculoskeletal: Normal range of motion.        General: Tenderness present. No swelling, deformity or signs of  injury.     Right shoulder: Normal.     Left shoulder: Normal.     Right elbow: Normal.    Left elbow: Normal.     Right hip: Normal.     Left hip: Normal.     Right knee: Normal.     Left knee: Normal.     Right ankle: Normal.     Left ankle: Normal.     Right lower leg: No edema.     Left lower leg: No edema.     Right foot: Normal range of motion and normal capillary refill. Tenderness, swelling and bunion present. No bony tenderness, crepitus, deformity, laceration or prominent metatarsal heads.     Left foot:  Normal.       Feet:  Feet:     Right foot:     Skin integrity: Erythema, warmth, callus and dry skin present. No ulcer, blister, skin breakdown or fissure.     Toenail Condition: Right toenails are normal.     Comments: 5th digit medial macular erythema between MCP and PIP joints; plantar wart with callous 4th digit medial surface punctate noted centrally Lymphadenopathy:     Head:     Right side of head: No submental, submandibular, tonsillar, preauricular, posterior auricular or occipital adenopathy.     Left side of head: No submental, submandibular, tonsillar, preauricular, posterior auricular or occipital adenopathy.     Cervical: No cervical adenopathy.     Right cervical: No superficial cervical adenopathy.    Left cervical: No superficial cervical adenopathy.  Skin:    General: Skin is warm and dry.     Capillary Refill: Capillary refill takes less than 2 seconds.     Coloration: Skin is not ashen, cyanotic, jaundiced, mottled, pale or sallow.     Findings: Erythema, lesion and rash present. No abrasion, abscess, acne, bruising, burn, ecchymosis, laceration, petechiae or wound. Rash is macular, nodular and papular. Rash is not crusting, purpuric, pustular, scaling, urticarial or vesicular.     Nails: There is no clubbing.   Neurological:     General: No focal deficit present.     Mental Status: She is alert and oriented to person, place, and time. Mental status is at baseline.     GCS: GCS eye subscore is 4. GCS verbal subscore is 5. GCS motor subscore is 6.     Cranial Nerves: Cranial nerves are intact. No cranial nerve deficit, dysarthria or facial asymmetry.     Sensory: Sensation is intact. No sensory deficit.     Motor: Motor function is intact. No weakness, tremor, atrophy, abnormal muscle tone or seizure activity.     Coordination: Coordination is intact. Coordination normal.     Gait: Gait abnormal.     Comments: Patient does not have right shoe on completely barefoot  limping in hallway prior to wart treatment "Hurts to have my toes squished together or putting pressure on toe right foot"; in/out of chair and on/off exam table without difficulty  Psychiatric:        Attention and Perception: Attention and perception normal.        Mood and Affect: Mood and affect normal.        Speech: Speech normal.        Behavior: Behavior normal. Behavior is cooperative.        Thought Content: Thought content normal.        Cognition and Memory: Cognition and memory normal.        Judgment:  Judgment normal.     Treated with shaving callous with #10 blade until pinpoint of blood noted and slight tenderness. Medial right 4th digit. Band-Aid and neosporin applied from clinic stock.  Clean dry and intact on discharge ambulatory.  Patient no longer limping gait sure and steady in hallway and wearing shoe completely on foot now not heel lifted out partway.      Assessment & Plan:  A-plantar wart right foot initial visit  P-Treated with shaving callous with #10 blade until pinpoint of blood noted and slight tenderness. Band-Aid and neosporin applied from clinic stock.  May buy salicytic acid/compound W OTC and apply daily this week or use duct tape apply strip completing occluding affected area right 4th toe and leave on until appt next week at 1000 Tuesday (6 Oct)  Patient reported minimal pain after procedure. Discussed with patient to keep clean and dry and follow up in 7 days for reshave.  Cryotherapy not available in [facility] clinic.  PCM may have available.  Discussed with patient 2-3  treatments may be required to eradicate wart. Exitcare handout on plantar warts printed and given to patient. Patient notified to expect scab at treated area but if erythema, purulent drainage, red streak radiating up hand/foot to return to clinic for reevaluation. Patient agreed with plan of care, verbalized understanding of instructions/information and had no further questions at this time.

## 2019-05-25 NOTE — Patient Instructions (Signed)
Plantar Warts Warts are small growths on the skin. When they occur on the underside (sole) of the foot, they are called plantar warts. Plantar warts often occur in groups, with several small warts around a larger wart. They tend to develop on the heel or the ball of the foot. They may grow into the deeper layers of skin or rise above the surface of the skin. Most warts are not painful, and they usually do not cause problems. However, plantar warts may cause pain when you walk because pressure is applied to them. Plantar warts may spread to other areas of the sole. They can also spread to other areas of the body through direct and indirect contact. Warts often go away on their own in time. Various treatments may be done if needed or desired. What are the causes? Plantar warts are caused by a type of virus that is called human papillomavirus (HPV).  Walking barefoot can cause exposure to the virus, especially if your feet are wet.  HPV attacks a break in the skin of the foot. What increases the risk? You are more likely to develop this condition if you:  Are between 10-20 years of age.  Use public showers or locker rooms.  Have a weakened body defense system (immune system). What are the signs or symptoms? Common symptoms of this condition include:  Flat or slightly raised growths that have a rough surface and look similar to a callus.  Pain when you use your foot to support your body weight. How is this diagnosed? A plantar wart can usually be diagnosed from its appearance. In some cases, a tissue sample may be removed (biopsy) to be looked at under a microscope. How is this treated? In many cases, warts do not need treatment. Without treatment, they often go away with time. If treatment is needed or desired, options may include:  Applying medicated solutions, creams, or patches to the wart. These may be over-the-counter or prescription medicines that make the skin soft so that layers will  gradually shed away. In many cases, the medicine is applied one or two times a day and covered with a bandage.  Freezing the wart with liquid nitrogen (cryotherapy).  Burning the wart with: ? Laser treatment. ? An electrified probe (electrocautery).  Injecting a medicine (Candida antigen) into the wart to help the body's immune system fight off the wart.  Having surgery to remove the wart.  Putting duct tape over the top of the wart (occlusion). You will leave the tape in place for as long as told by your health care provider, and then you will replace it with a new strip of tape. This is done until the wart goes away. Repeat treatment may be needed if you choose to remove warts. Warts sometimes go away and come back again. Follow these instructions at home:  Apply medicated creams or solutions only as told by your health care provider. This may involve: ? Soaking the affected area in warm water. ? Removing the top layer of softened skin before you apply the medicine. A pumice stone works well for removing the skin. ? Applying a bandage over the affected area after you apply the medicine. ? Repeating the process daily or as told by your health care provider.  Do not scratch or pick at a wart.  Wash your hands after you touch a wart.  If a wart is painful, try covering it with a bandage that has a hole in the middle. This helps   to take pressure off the wart.  Keep all follow-up visits as told by your health care provider. This is important. How is this prevented? Take these actions to help prevent warts:  Wear shoes and socks. Change your socks daily.  Keep your feet clean and dry.  Do not walk barefoot in shared locker rooms, shower areas, or swimming pools.  Check your feet regularly.  Avoid direct contact with warts on other people. Contact a health care provider if:  Your warts do not improve after treatment.  You have redness, swelling, or pain at the site of a wart.   You have bleeding from a wart that does not stop with light pressure.  You have diabetes and you develop a wart. Summary  Warts are small growths on the skin. When they occur on the underside (sole) of the foot, they are called plantar warts.  In many cases, warts do not need treatment. Without treatment, they often go away with time.  Apply medicated creams or solutions only as told by your health care provider.  Do not scratch or pick at a wart. Wash your hands after you touch a wart.  Keep all follow-up visits as told by your health care provider. This is important. This information is not intended to replace advice given to you by your health care provider. Make sure you discuss any questions you have with your health care provider. Document Released: 11/02/2003 Document Revised: 03/10/2018 Document Reviewed: 03/10/2018 Elsevier Patient Education  2020 [employer]  

## 2019-06-01 ENCOUNTER — Encounter: Payer: Self-pay | Admitting: Registered Nurse

## 2019-06-01 ENCOUNTER — Ambulatory Visit: Payer: Self-pay | Admitting: Registered Nurse

## 2019-06-01 ENCOUNTER — Other Ambulatory Visit: Payer: Self-pay

## 2019-06-01 VITALS — BP 98/64 | HR 71 | Temp 98.0°F

## 2019-06-01 DIAGNOSIS — B351 Tinea unguium: Secondary | ICD-10-CM

## 2019-06-01 DIAGNOSIS — B070 Plantar wart: Secondary | ICD-10-CM

## 2019-06-01 MED ORDER — TEA TREE OIL OIL: 1.0000 "application " | TOPICAL_OIL | Freq: Every day | Status: AC

## 2019-06-01 NOTE — Progress Notes (Signed)
Subjective:    Patient ID: Rebecca Sweeney, female    DOB: 1961/01/27, 58 y.o.   MRN: [id number]  58y/o married Caucasian established female pt presenting for reshave of plantar wart on R foot 4th lateral toe. Denied pain or drainage since first shave on 05/25/19.  Patient reported pain decreased and shoes no longer hurting to wear.  She did OTC wart treatment for a couple days but stopped and switched to occluding wart with duct tape when she didn't notice any improvement with OTC treatment.  She has a thickened second toenail and wondering if this is another wart under her nail also on right foot.      Review of Systems  Constitutional: Negative for activity change, appetite change, chills, diaphoresis, fatigue, fever and unexpected weight change.  HENT: Negative for trouble swallowing and voice change.   Eyes: Negative for photophobia and visual disturbance.  Respiratory: Negative for cough, shortness of breath, wheezing and stridor.   Cardiovascular: Negative for leg swelling.  Gastrointestinal: Negative for abdominal pain, diarrhea, nausea and vomiting.  Endocrine: Negative for cold intolerance and heat intolerance.  Genitourinary: Negative for difficulty urinating.  Musculoskeletal: Negative for gait problem, joint swelling, myalgias, neck pain and neck stiffness.  Skin: Positive for rash. Negative for color change, pallor and wound.  Allergic/Immunologic: Negative for food allergies.  Neurological: Negative for dizziness, tremors, seizures, syncope, facial asymmetry, speech difficulty, weakness, light-headedness, numbness and headaches.  Hematological: Negative for adenopathy. Does not bruise/bleed easily.  Psychiatric/Behavioral: Negative for agitation, confusion and sleep disturbance.       Objective:   Physical Exam Vitals signs and nursing note reviewed.  Constitutional:      General: She is awake. She is not in acute distress.    Appearance: Normal appearance. She is  well-developed, well-groomed and normal weight. She is not ill-appearing or toxic-appearing.  HENT:     Head: Normocephalic and atraumatic.     Jaw: There is normal jaw occlusion.     Salivary Glands: Right salivary gland is not diffusely enlarged or tender. Left salivary gland is not diffusely enlarged or tender.     Right Ear: External ear normal.     Left Ear: External ear normal.     Nose: Nose normal.  Eyes:     General: Lids are normal. Vision grossly intact. Gaze aligned appropriately. Allergic shiner present. No visual field deficit or scleral icterus.       Right eye: No discharge.        Left eye: No discharge.     Extraocular Movements: Extraocular movements intact.     Conjunctiva/sclera: Conjunctivae normal.     Pupils: Pupils are equal, round, and reactive to light.  Neck:     Musculoskeletal: Normal range of motion and neck supple. Normal range of motion. No edema, erythema, neck rigidity, crepitus, pain with movement or torticollis.     Thyroid: No thyromegaly.     Trachea: Trachea and phonation normal.  Cardiovascular:     Rate and Rhythm: Normal rate and regular rhythm.     Pulses: Normal pulses.          Radial pulses are 2+ on the right side and 2+ on the left side.       Dorsalis pedis pulses are 2+ on the right side.     Heart sounds: Normal heart sounds.  Pulmonary:     Effort: Pulmonary effort is normal. No respiratory distress.     Breath sounds: Normal breath sounds and air  entry. No stridor, decreased air movement or transmitted upper airway sounds. No decreased breath sounds, wheezing, rhonchi or rales.     Comments: No cough observed in exam room; spoke full sentences without difficulty; wearing cloth mask due to covid 19 pandemic Abdominal:     Palpations: Abdomen is soft.  Musculoskeletal: Normal range of motion.        General: No swelling, tenderness, deformity or signs of injury.     Right shoulder: Normal.     Left shoulder: Normal.     Right  elbow: Normal.    Left elbow: Normal.     Right hip: Normal.     Left hip: Normal.     Right knee: Normal.     Left knee: Normal.     Right ankle: Normal.     Left ankle: Normal.     Cervical back: Normal.     Thoracic back: Normal.     Lumbar back: Normal.     Right hand: Normal.     Left hand: Normal.     Right lower leg: No edema.     Left lower leg: No edema.     Right foot: Normal range of motion and normal capillary refill. No tenderness, bony tenderness, swelling, crepitus, deformity or laceration.     Left foot: Normal.  Feet:     Right foot:     Skin integrity: Callus present. No ulcer, blister, skin breakdown, erythema, dry skin or fissure.     Toenail Condition: Right toenails are abnormally thick.     Comments: Right 2nd distal toenail thickened friable and callous noted distal phalanx; toenails with red polish halfway to distal; patient removed duct tape right 4th digit punctates noted centrally lesion between 4th/5th digits 4mm diameter on 4th proximal phalanx; shaved callous/thickened skin with #10 blade until pinprick bleeding noted capillary and patient noted tenderness; cleansed again with iodine, applied triple antibiotic and bandaid.  Bandaid clean dry and intact prior to ambulatory discharge; patient able to wear her lavender tennis shoes without pain gait sure and steady without limping on discharge Lymphadenopathy:     Head:     Right side of head: No submental, submandibular, preauricular, posterior auricular or occipital adenopathy.     Left side of head: No submental, submandibular, preauricular, posterior auricular or occipital adenopathy.     Cervical: No cervical adenopathy.     Right cervical: No superficial cervical adenopathy.    Left cervical: No superficial cervical adenopathy.  Skin:    General: Skin is warm and dry.     Capillary Refill: Capillary refill takes less than 2 seconds.     Coloration: Skin is not ashen, cyanotic, jaundiced, mottled, pale  or sallow.     Findings: Lesion and rash present. No abrasion, abscess, acne, bruising, burn, ecchymosis, erythema, signs of injury, laceration, petechiae or wound. Rash is papular and scaling. Rash is not crusting, macular, nodular, purpuric, pustular, urticarial or vesicular.     Nails: There is no clubbing.      Comments: 2nd right distal toe scaling white with papules adjacent to distal nailbed thickened and friable and 4th right proximal toe scaling/calloused nontender 4mm diameter with punctates centrally  Neurological:     General: No focal deficit present.     Mental Status: She is alert and oriented to person, place, and time. Mental status is at baseline.     GCS: GCS eye subscore is 4. GCS verbal subscore is 5. GCS motor subscore is 6.  Cranial Nerves: Cranial nerves are intact. No cranial nerve deficit, dysarthria or facial asymmetry.     Sensory: Sensation is intact. No sensory deficit.     Motor: Motor function is intact. No weakness, tremor, atrophy, abnormal muscle tone or seizure activity.     Coordination: Coordination is intact. Coordination normal.     Gait: Gait is intact. Gait normal.     Comments: Gait sure and steady in hallway; in/out of chair and on/off exam table without difficulty  Psychiatric:        Attention and Perception: Attention and perception normal.        Mood and Affect: Mood and affect normal.        Speech: Speech normal.        Behavior: Behavior normal. Behavior is cooperative.        Thought Content: Thought content normal.        Cognition and Memory: Cognition and memory normal.        Judgment: Judgment normal.   verbal consent obtained from patient prior to procedure; risks infection and/or cut near wart site;  purpose remove plantar wart right 4th toe        Assessment & Plan:  A-plantar wart right 4th toe subsequent visit, onychomycosis right 2nd toe initial visit  P--Treated with shaving lesion with #10 blade until pinpoint of  blood noted and slight tenderness. Band-Aid and neosporin applied from clinic stock. change bandaid prn soiling at least daily and reapply neosporin x 48 hours.  Then reapply duct tape strip completely occluding wart affected area 4th toe right and leave on until follow up visit 13 Oct.  Patient reported minimal pain after procedure. Discussed with patient to keep clean and dry and follow up in 7 days for reshave.  Cryotherapy not available in [facility] clinic.  PCM may have available.  Discussed with patient 2-3  treatments may be required to eradicate wart. Exitcare handout on plantar warts  Patient notified to expect scab at treated area but if erythema, purulent drainage, red streak radiating up hand/foot to return to clinic for reevaluation. Patient agreed with plan of care, verbalized understanding of instructions/information and had no further questions at this time.  Exitcare handout on onychomycosis.  Patient to try trial of tea tree oil application daily to second toe nail and skin for one month then seek re-evaluation with me.  If no improvement consider change of therapy.  Discussed with patient fungal toenail infections can take 3-6 months to treat completely.  Patient verbalized understanding information/instructions, agreed with plan of care and had no further questions at this time.

## 2019-06-01 NOTE — Patient Instructions (Signed)
Plantar Warts Warts are small growths on the skin. When they occur on the underside (sole) of the foot, they are called plantar warts. Plantar warts often occur in groups, with several small warts around a larger wart. They tend to develop on the heel or the ball of the foot. They may grow into the deeper layers of skin or rise above the surface of the skin. Most warts are not painful, and they usually do not cause problems. However, plantar warts may cause pain when you walk because pressure is applied to them. Plantar warts may spread to other areas of the sole. They can also spread to other areas of the body through direct and indirect contact. Warts often go away on their own in time. Various treatments may be done if needed or desired. What are the causes? Plantar warts are caused by a type of virus that is called human papillomavirus (HPV).  Walking barefoot can cause exposure to the virus, especially if your feet are wet.  HPV attacks a break in the skin of the foot. What increases the risk? You are more likely to develop this condition if you:  Are between 10-20 years of age.  Use public showers or locker rooms.  Have a weakened body defense system (immune system). What are the signs or symptoms? Common symptoms of this condition include:  Flat or slightly raised growths that have a rough surface and look similar to a callus.  Pain when you use your foot to support your body weight. How is this diagnosed? A plantar wart can usually be diagnosed from its appearance. In some cases, a tissue sample may be removed (biopsy) to be looked at under a microscope. How is this treated? In many cases, warts do not need treatment. Without treatment, they often go away with time. If treatment is needed or desired, options may include:  Applying medicated solutions, creams, or patches to the wart. These may be over-the-counter or prescription medicines that make the skin soft so that layers will  gradually shed away. In many cases, the medicine is applied one or two times a day and covered with a bandage.  Freezing the wart with liquid nitrogen (cryotherapy).  Burning the wart with: ? Laser treatment. ? An electrified probe (electrocautery).  Injecting a medicine (Candida antigen) into the wart to help the body's immune system fight off the wart.  Having surgery to remove the wart.  Putting duct tape over the top of the wart (occlusion). You will leave the tape in place for as long as told by your health care provider, and then you will replace it with a new strip of tape. This is done until the wart goes away. Repeat treatment may be needed if you choose to remove warts. Warts sometimes go away and come back again. Follow these instructions at home:  Apply medicated creams or solutions only as told by your health care provider. This may involve: ? Soaking the affected area in warm water. ? Removing the top layer of softened skin before you apply the medicine. A pumice stone works well for removing the skin. ? Applying a bandage over the affected area after you apply the medicine. ? Repeating the process daily or as told by your health care provider.  Do not scratch or pick at a wart.  Wash your hands after you touch a wart.  If a wart is painful, try covering it with a bandage that has a hole in the middle. This helps   to take pressure off the wart.  Keep all follow-up visits as told by your health care provider. This is important. How is this prevented? Take these actions to help prevent warts:  Wear shoes and socks. Change your socks daily.  Keep your feet clean and dry.  Do not walk barefoot in shared locker rooms, shower areas, or swimming pools.  Check your feet regularly.  Avoid direct contact with warts on other people. Contact a health care provider if:  Your warts do not improve after treatment.  You have redness, swelling, or pain at the site of a wart.   You have bleeding from a wart that does not stop with light pressure.  You have diabetes and you develop a wart. Summary  Warts are small growths on the skin. When they occur on the underside (sole) of the foot, they are called plantar warts.  In many cases, warts do not need treatment. Without treatment, they often go away with time.  Apply medicated creams or solutions only as told by your health care provider.  Do not scratch or pick at a wart. Wash your hands after you touch a wart.  Keep all follow-up visits as told by your health care provider. This is important. This information is not intended to replace advice given to you by your health care provider. Make sure you discuss any questions you have with your health care provider. Document Released: 11/02/2003 Document Revised: 03/10/2018 Document Reviewed: 03/10/2018 Elsevier Patient Education  2020 [employer] Fungal Nail Infection A fungal nail infection is a common infection of the toenails or fingernails. This condition affects toenails more often than fingernails. It often affects the great, or big, toes. More than one nail may be infected. The condition can be passed from person to person (is contagious). What are the causes? This condition is caused by a fungus. Several types of fungi can cause the infection. These fungi are common in moist and warm areas. If your hands or feet come into contact with the fungus, it may get into a crack in your fingernail or toenail and cause the infection. What increases the risk? The following factors may make you more likely to develop this condition:  Being female.  Being of older age.  Living with someone who has the fungus.  Walking barefoot in areas where the fungus thrives, such as showers or locker rooms.  Wearing shoes and socks that cause your feet to sweat.  Having a nail injury or a recent nail surgery.  Having certain medical conditions, such as: ? Athlete's foot.  ? Diabetes. ? Psoriasis. ? Poor circulation. ? A weak body defense system (immune system). What are the signs or symptoms? Symptoms of this condition include:  A pale spot on the nail.  Thickening of the nail.  A nail that becomes yellow or brown.  A brittle or ragged nail edge.  A crumbling nail.  A nail that has lifted away from the nail bed. How is this diagnosed? This condition is diagnosed with a physical exam. Your health care provider may take a scraping or clipping from your nail to test for the fungus. How is this treated? Treatment is not needed for mild infections. If you have significant nail changes, treatment may include:  Antifungal medicines taken by mouth (orally). You may need to take the medicine for several weeks or several months, and you may not see the results for a long time. These medicines can cause side effects. Ask your health  care provider what problems to watch for.  Antifungal nail polish or nail cream. These may be used along with oral antifungal medicines.  Laser treatment of the nail.  Surgery to remove the nail. This may be needed for the most severe infections. It can take a long time, usually up to a year, for the infection to go away. The infection may also come back. Follow these instructions at home: Medicines  Take or apply over-the-counter and prescription medicines only as told by your health care provider.  Ask your health care provider about using over-the-counter mentholated ointment on your nails. Nail care  Trim your nails often.  Wash and dry your hands and feet every day.  Keep your feet dry: ? Wear absorbent socks, and change your socks frequently. ? Wear shoes that allow air to circulate, such as sandals or canvas tennis shoes. Throw out old shoes.  Do not use artificial nails.  If you go to a nail salon, make sure you choose one that uses clean instruments.  Use antifungal foot powder on your feet and in your  shoes. General instructions  Do not share personal items, such as towels or nail clippers.  Do not walk barefoot in shower rooms or locker rooms.  Wear rubber gloves if you are working with your hands in wet areas.  Keep all follow-up visits as told by your health care provider. This is important. Contact a health care provider if: Your infection is not getting better or it is getting worse after several months. Summary  A fungal nail infection is a common infection of the toenails or fingernails.  Treatment is not needed for mild infections. If you have significant nail changes, treatment may include taking medicine orally and applying medicine to your nails.  It can take a long time, usually up to a year, for the infection to go away. The infection may also come back.  Take or apply over-the-counter and prescription medicines only as told by your health care provider.  Follow instructions for taking care of your nails to help prevent infection from coming back or spreading. This information is not intended to replace advice given to you by your health care provider. Make sure you discuss any questions you have with your health care provider. Document Released: 08/09/2000 Document Revised: 12/03/2018 Document Reviewed: 01/16/2018 Elsevier Patient Education  2020 [employer].

## 2019-06-08 ENCOUNTER — Encounter: Payer: Self-pay | Admitting: Registered Nurse

## 2019-06-08 ENCOUNTER — Other Ambulatory Visit: Payer: Self-pay

## 2019-06-08 ENCOUNTER — Ambulatory Visit: Payer: Self-pay | Admitting: Registered Nurse

## 2019-06-08 VITALS — BP 120/83 | HR 68 | Temp 98.6°F

## 2019-06-08 DIAGNOSIS — B353 Tinea pedis: Secondary | ICD-10-CM

## 2019-06-08 DIAGNOSIS — B070 Plantar wart: Secondary | ICD-10-CM

## 2019-06-08 NOTE — Progress Notes (Signed)
Subjective:    Patient ID: Rebecca Sweeney, female    DOB: 1960/10/29, 58 y.o.   MRN: [id number]  58y/o caucasian female married here for plantar wart reshave right foot.  Completed x 2 had been applying bandaid and neosporin this week no duct tape.  Pain resolved completely.  Patient reported this is the best her foot has felt in shoes in a long time.  Denied discharge/fever/swelling/rash.  Small scab on toe.  Hard area has decreased in size.     Review of Systems  Constitutional: Negative for chills, diaphoresis, fatigue and fever.  HENT: Negative for trouble swallowing and voice change.   Eyes: Negative for photophobia and visual disturbance.  Respiratory: Negative for cough, shortness of breath, wheezing and stridor.   Cardiovascular: Negative for leg swelling.  Gastrointestinal: Negative for abdominal pain, diarrhea, nausea and vomiting.  Endocrine: Negative for cold intolerance and heat intolerance.  Genitourinary: Negative for difficulty urinating.  Musculoskeletal: Negative for gait problem, joint swelling, myalgias, neck pain and neck stiffness.  Skin: Negative for color change, pallor, rash and wound.  Allergic/Immunologic: Positive for environmental allergies.  Neurological: Negative for tremors, weakness and numbness.  Hematological: Negative for adenopathy. Does not bruise/bleed easily.  Psychiatric/Behavioral: Negative for agitation, confusion and sleep disturbance.       Objective:   Physical Exam Vitals signs reviewed.  Constitutional:      General: She is awake. She is not in acute distress.    Appearance: Normal appearance. She is well-developed, well-groomed and normal weight. She is not ill-appearing, toxic-appearing or diaphoretic.  HENT:     Head: Normocephalic and atraumatic.     Jaw: There is normal jaw occlusion.     Right Ear: External ear normal.     Left Ear: External ear normal.     Nose: Nose normal.     Mouth/Throat:     Mouth: Mucous membranes  are moist.  Eyes:     General: Lids are normal. No visual field deficit or scleral icterus.       Right eye: No discharge.        Left eye: No discharge.     Extraocular Movements: Extraocular movements intact.     Conjunctiva/sclera: Conjunctivae normal.     Pupils: Pupils are equal, round, and reactive to light.  Neck:     Musculoskeletal: Normal range of motion and neck supple. No neck rigidity.     Trachea: Trachea normal.  Cardiovascular:     Rate and Rhythm: Normal rate and regular rhythm.     Pulses: Normal pulses.          Radial pulses are 2+ on the right side and 2+ on the left side.       Dorsalis pedis pulses are 2+ on the right side.       Posterior tibial pulses are 2+ on the right side.     Heart sounds: Normal heart sounds.  Pulmonary:     Effort: Pulmonary effort is normal. No respiratory distress.     Breath sounds: Normal breath sounds and air entry. No stridor, decreased air movement or transmitted upper airway sounds. No decreased breath sounds, wheezing, rhonchi or rales.     Comments: Wearing cloth mask due to covid 19 pandemic; no cough observed in exam room; spoke full sentences without difficulty Abdominal:     General: Abdomen is flat.  Musculoskeletal: Normal range of motion.        General: No swelling, tenderness, deformity or signs of  injury.     Right shoulder: Normal.     Left shoulder: Normal.     Right elbow: Normal.    Left elbow: Normal.     Right hip: Normal.     Left hip: Normal.     Right knee: Normal.     Left knee: Normal.     Right ankle: Normal.     Left ankle: Normal.     Cervical back: Normal.     Thoracic back: Normal.     Lumbar back: Normal.     Right hand: Normal.     Left hand: Normal.     Right lower leg: No edema.     Left lower leg: No edema.  Lymphadenopathy:     Head:     Right side of head: No submental, submandibular, tonsillar, preauricular, posterior auricular or occipital adenopathy.     Left side of head: No  submental, submandibular, tonsillar, preauricular, posterior auricular or occipital adenopathy.     Cervical: No cervical adenopathy.     Right cervical: No superficial cervical adenopathy.    Left cervical: No superficial cervical adenopathy.  Skin:    General: Skin is warm and dry.     Capillary Refill: Capillary refill takes less than 2 seconds.     Coloration: Skin is not ashen, cyanotic, jaundiced, mottled, pale or sallow.     Findings: Lesion and rash present. No abrasion, abscess, acne, bruising, burn, ecchymosis, erythema, signs of injury, laceration, petechiae or wound. Rash is scaling. Rash is not crusting, macular, nodular, papular, purpuric, pustular, urticarial or vesicular.     Nails: There is no clubbing.      Comments: Scaling noted between 2-3 and 3-4 digits right foot no maceration noted dry; punctates noted and callous 6mm right lateral 4th toe between 4-5 nontender dry; patient had on bandaid removed for eval  Neurological:     General: No focal deficit present.     Mental Status: She is alert and oriented to person, place, and time. Mental status is at baseline.     GCS: GCS eye subscore is 4. GCS verbal subscore is 5. GCS motor subscore is 6.     Cranial Nerves: Cranial nerves are intact. No cranial nerve deficit, dysarthria or facial asymmetry.     Sensory: Sensation is intact. No sensory deficit.     Motor: Motor function is intact. No weakness, tremor, atrophy, abnormal muscle tone or seizure activity.     Coordination: Coordination is intact. Coordination normal.     Gait: Gait is intact. Gait normal.     Comments: Gait sure and steady in hallway; on/off exam table and in/out of chair without difficulty  Psychiatric:        Attention and Perception: Attention and perception normal.        Mood and Affect: Mood and affect normal.        Speech: Speech normal.        Behavior: Behavior normal. Behavior is cooperative.        Thought Content: Thought content normal.         Judgment: Judgment normal.           Assessment & Plan:  A-plantar wart right foot subsequent visit; athlete's foot right initial visit  P--cleansed with rubbing alcohol and Treated with shaving callous with #10 blade until pinpoint of blood noted and slight tenderness. Band-Aid and neosporin applied from clinic stock.   duct tape apply strip completely occluding affected area right  4th toe and leave on for 7 days then remove.  Patient reported minimal pain after procedure. Discussed with patient to keep clean and dry and follow up in 7 days for reshave if punctates/hard area noted again.  [facility].  PCM may have available.  Discussed with patient 2-3  treatments may be required to eradicate wart. Exitcare handout on plantar warts. Patient notified to expect scab at treated area but if erythema, purulent drainage, red streak radiating up hand/foot to return to clinic for reevaluation. Patient agreed with plan of care, verbalized understanding of instructions/information and had no further questions at this time.  Athlete's foot powder or spray daily prn OTC recommended.  Change socks more often than once a day if sweat soaked.  Rotate shoes to allow them to completely air dry do not wear the same pair every day.  Place shoes with laces loosened in sunlight UV light helps to kill fungus.  Medication as directed. Call or return to clinic as needed if these symptoms  worsen or fail to improve as anticipated. Exitcare handout on athletes foot.  Reoccurrence of condition common and may require retreatment. Patient verbalized agreement and understanding of treatment plan and had no  further questions at this time. P2: Avoidance walking barefoot and hand washing.

## 2019-06-08 NOTE — Patient Instructions (Signed)
Plantar Warts Warts are small growths on the skin. When they occur on the underside (sole) of the foot, they are called plantar warts. Plantar warts often occur in groups, with several small warts around a larger wart. They tend to develop on the heel or the ball of the foot. They may grow into the deeper layers of skin or rise above the surface of the skin. Most warts are not painful, and they usually do not cause problems. However, plantar warts may cause pain when you walk because pressure is applied to them. Plantar warts may spread to other areas of the sole. They can also spread to other areas of the body through direct and indirect contact. Warts often go away on their own in time. Various treatments may be done if needed or desired. What are the causes? Plantar warts are caused by a type of virus that is called human papillomavirus (HPV).  Walking barefoot can cause exposure to the virus, especially if your feet are wet.  HPV attacks a break in the skin of the foot. What increases the risk? You are more likely to develop this condition if you:  Are between 10-20 years of age.  Use public showers or locker rooms.  Have a weakened body defense system (immune system). What are the signs or symptoms? Common symptoms of this condition include:  Flat or slightly raised growths that have a rough surface and look similar to a callus.  Pain when you use your foot to support your body weight. How is this diagnosed? A plantar wart can usually be diagnosed from its appearance. In some cases, a tissue sample may be removed (biopsy) to be looked at under a microscope. How is this treated? In many cases, warts do not need treatment. Without treatment, they often go away with time. If treatment is needed or desired, options may include:  Applying medicated solutions, creams, or patches to the wart. These may be over-the-counter or prescription medicines that make the skin soft so that layers will  gradually shed away. In many cases, the medicine is applied one or two times a day and covered with a bandage.  Freezing the wart with liquid nitrogen (cryotherapy).  Burning the wart with: ? Laser treatment. ? An electrified probe (electrocautery).  Injecting a medicine (Candida antigen) into the wart to help the body's immune system fight off the wart.  Having surgery to remove the wart.  Putting duct tape over the top of the wart (occlusion). You will leave the tape in place for as long as told by your health care provider, and then you will replace it with a new strip of tape. This is done until the wart goes away. Repeat treatment may be needed if you choose to remove warts. Warts sometimes go away and come back again. Follow these instructions at home:  Apply medicated creams or solutions only as told by your health care provider. This may involve: ? Soaking the affected area in warm water. ? Removing the top layer of softened skin before you apply the medicine. A pumice stone works well for removing the skin. ? Applying a bandage over the affected area after you apply the medicine. ? Repeating the process daily or as told by your health care provider.  Do not scratch or pick at a wart.  Wash your hands after you touch a wart.  If a wart is painful, try covering it with a bandage that has a hole in the middle. This helps   to take pressure off the wart.  Keep all follow-up visits as told by your health care provider. This is important. How is this prevented? Take these actions to help prevent warts:  Wear shoes and socks. Change your socks daily.  Keep your feet clean and dry.  Do not walk barefoot in shared locker rooms, shower areas, or swimming pools.  Check your feet regularly.  Avoid direct contact with warts on other people. Contact a health care provider if:  Your warts do not improve after treatment.  You have redness, swelling, or pain at the site of a wart.   You have bleeding from a wart that does not stop with light pressure.  You have diabetes and you develop a wart. Summary  Warts are small growths on the skin. When they occur on the underside (sole) of the foot, they are called plantar warts.  In many cases, warts do not need treatment. Without treatment, they often go away with time.  Apply medicated creams or solutions only as told by your health care provider.  Do not scratch or pick at a wart. Wash your hands after you touch a wart.  Keep all follow-up visits as told by your health care provider. This is important. This information is not intended to replace advice given to you by your health care provider. Make sure you discuss any questions you have with your health care provider. Document Released: 11/02/2003 Document Revised: 03/10/2018 Document Reviewed: 03/10/2018 Elsevier Patient Education  [place]  Athlete's foot (tinea pedis) is a fungal infection of the skin on your feet. It often occurs on the skin that is between or underneath the toes. It can also occur on the soles of your feet. The infection can spread from person to person (is contagious). It can also spread when a person's bare feet come in contact with the fungus on shower floors or on items such as shoes. What are the causes? This condition is caused by a fungus that grows in warm, moist places. You can get athlete's foot by sharing shoes, shower stalls, towels, and wet floors with someone who is infected. Not washing your feet or changing your socks often enough can also lead to athlete's foot. What increases the risk? This condition is more likely to develop in:  Men.  People who have a weak body defense system (immune system).  People who have diabetes.  People who use public showers, such as at a gym.  People who wear heavy-duty shoes, such as Environmental manager.  Seasons with warm, humid weather. What are the  signs or symptoms? Symptoms of this condition include:  Itchy areas between your toes or on the soles of your feet.  White, flaky, or scaly areas between your toes or on the soles of your feet.  Very itchy small blisters between your toes or on the soles of your feet.  Small cuts in your skin. These cuts can become infected.  Thick or discolored toenails. How is this diagnosed? This condition may be diagnosed with a physical exam and a review of your medical history. Your health care provider may also take a skin or toenail sample to examine under a microscope. How is this treated? This condition is treated with antifungal medicines. These may be applied as powders, ointments, or creams. In severe cases, an oral antifungal medicine may be given. Follow these instructions at home: Medicines  Apply or take over-the-counter and prescription medicines only as told  by your health care provider.  Apply your antifungal medicine as told by your health care provider. Do not stop using the antifungal even if your condition improves. Foot care  Do not scratch your feet.  Keep your feet dry: ? Wear cotton or wool socks. Change your socks every day or if they become wet. ? Wear shoes that allow air to flow, such as sandals or canvas tennis shoes.  Wash and dry your feet, including the area between your toes. Also, wash and dry your feet: ? Every day or as told by your health care provider. ? After exercising. General instructions  Do not let others use towels, shoes, nail clippers, or other personal items that touch your feet.  Protect your feet by wearing sandals in wet areas, such as locker rooms and shared showers.  Keep all follow-up visits as told by your health care provider. This is important.  If you have diabetes, keep your blood sugar under control. Contact a health care provider if:  You have a fever.  You have swelling, soreness, warmth, or redness in your foot.  Your  feet are not getting better with treatment.  Your symptoms get worse.  You have new symptoms. Summary  Athlete's foot (tinea pedis) is a fungal infection of the skin on your feet. It often occurs on skin that is between or underneath the toes.  This condition is caused by a fungus that grows in warm, moist places.  Symptoms include white, flaky, or scaly areas between your toes or on the soles of your feet.  This condition is treated with antifungal medicines.  Keep your feet clean. Always dry them thoroughly. This information is not intended to replace advice given to you by your health care provider. Make sure you discuss any questions you have with your health care provider. Document Released: 08/09/2000 Document Revised: 08/07/2017 Document Reviewed: 06/02/2017 Elsevier Patient Education  2020 [employer].

## 2019-07-29 ENCOUNTER — Other Ambulatory Visit: Payer: Self-pay

## 2019-07-29 ENCOUNTER — Ambulatory Visit: Payer: Self-pay | Admitting: Registered Nurse

## 2019-07-29 ENCOUNTER — Encounter: Payer: Self-pay | Admitting: Registered Nurse

## 2019-07-29 VITALS — BP 110/74 | HR 75 | Temp 98.6°F

## 2019-07-29 DIAGNOSIS — F17200 Nicotine dependence, unspecified, uncomplicated: Secondary | ICD-10-CM

## 2019-07-29 DIAGNOSIS — J019 Acute sinusitis, unspecified: Secondary | ICD-10-CM

## 2019-07-29 DIAGNOSIS — H6983 Other specified disorders of Eustachian tube, bilateral: Secondary | ICD-10-CM

## 2019-07-29 MED ORDER — PREDNISONE 10 MG (21) PO TBPK: ORAL_TABLET | ORAL | 0 refills | Status: DC

## 2019-07-29 MED ORDER — LORATADINE 10 MG PO TABS: 10.0000 mg | ORAL_TABLET | Freq: Every day | ORAL | 3 refills | Status: DC

## 2019-07-29 MED ORDER — SALINE SPRAY 0.65 % NA SOLN: 2.0000 | NASAL | 0 refills | Status: DC

## 2019-07-29 MED ORDER — AMOXICILLIN-POT CLAVULANATE 875-125 MG PO TABS: 1.0000 | ORAL_TABLET | Freq: Two times a day (BID) | ORAL | 0 refills | Status: DC

## 2019-07-29 NOTE — Progress Notes (Signed)
Subjective:    Patient ID: Rebecca Sweeney, female    DOB: [birth year], 58 y.o.   MRN: [id number]  58y/o Caucasian established female pt c/o R ear pressure since yesterday. Reports popping sensation and slight muffled hearing. Has turned heat on in house and car. Also opens windows at home on daily basis for fresh air exchange. Advised regular nasal saline use as she is only using intermittently and provided a new bottle from clinic stock. Also discussed and encouraged use of humidifier in bedroom at night.  Patient still smoking about 10 cigarettes per day "t is my stress relief"  Denied dental problems, ear discharge, fever/chills/n/v/d/loss of taste or smell/dyspnea, body aches/fatigue.  Has noticed some sinus pressure especially right cheek.     Review of Systems  Constitutional: Negative for activity change, appetite change, chills, diaphoresis, fatigue, fever and unexpected weight change.  HENT: Positive for congestion, ear pain, hearing loss, postnasal drip, sinus pressure and sinus pain. Negative for dental problem, drooling, ear discharge, facial swelling, mouth sores, nosebleeds, rhinorrhea, sneezing, sore throat, tinnitus, trouble swallowing and voice change.   Eyes: Negative for photophobia, pain, discharge, redness, itching and visual disturbance.  Respiratory: Negative for cough, choking, chest tightness, shortness of breath, wheezing and stridor.   Cardiovascular: Negative for chest pain, palpitations and leg swelling.  Gastrointestinal: Negative for abdominal pain, blood in stool, diarrhea, nausea and vomiting.  Endocrine: Negative for cold intolerance and heat intolerance.  Genitourinary: Negative for difficulty urinating, dysuria and hematuria.  Musculoskeletal: Negative for arthralgias, back pain, gait problem, joint swelling, myalgias, neck pain and neck stiffness.  Skin: Negative for color change, pallor, rash and wound.  Allergic/Immunologic: Positive for environmental  allergies. Negative for food allergies.  Neurological: Negative for dizziness, tremors, seizures, syncope, facial asymmetry, speech difficulty, weakness, light-headedness, numbness and headaches.  Hematological: Negative for adenopathy. Does not bruise/bleed easily.  Psychiatric/Behavioral: Negative for agitation, behavioral problems, confusion and sleep disturbance.       Objective:   Physical Exam Vitals signs and nursing note reviewed.  Constitutional:      General: She is awake. She is not in acute distress.    Appearance: Normal appearance. She is well-developed and well-groomed. She is not ill-appearing, toxic-appearing or diaphoretic.  HENT:     Head: Normocephalic and atraumatic.     Jaw: There is normal jaw occlusion. No trismus.     Salivary Glands: Right salivary gland is not diffusely enlarged or tender. Left salivary gland is not diffusely enlarged or tender.     Right Ear: Hearing, ear canal and external ear normal. A middle ear effusion is present. There is no impacted cerumen.     Left Ear: Hearing, ear canal and external ear normal. A middle ear effusion is present. There is no impacted cerumen.     Nose: Mucosal edema, congestion and rhinorrhea present. No nasal deformity, septal deviation or laceration.     Right Turbinates: Enlarged and swollen. Not pale.     Left Turbinates: Enlarged and swollen. Not pale.     Right Sinus: Maxillary sinus tenderness present. No frontal sinus tenderness.     Left Sinus: No maxillary sinus tenderness or frontal sinus tenderness.     Mouth/Throat:     Lips: Pink. No lesions.     Mouth: Mucous membranes are moist. Mucous membranes are not pale, not dry and not cyanotic. No injury, lacerations, oral lesions or angioedema.     Dentition: Does not have dentures. No dental caries, dental abscesses or gum  lesions.     Tongue: No lesions. Tongue does not deviate from midline.     Palate: No mass and lesions.     Pharynx: Uvula midline.  Pharyngeal swelling and posterior oropharyngeal erythema present. No oropharyngeal exudate or uvula swelling.     Tonsils: No tonsillar exudate or tonsillar abscesses.     Comments: Cobblestoning posterior pharynx; bilateral allergic shiners; lower eyelids edema nonpitting 1+/4; clear discharge bilateral nasal turbinates edema/erythema; nasal sniffing noted Eyes:     General: Lids are normal. Vision grossly intact. Gaze aligned appropriately. Allergic shiner present. No visual field deficit or scleral icterus.       Right eye: No foreign body, discharge or hordeolum.        Left eye: No foreign body, discharge or hordeolum.     Extraocular Movements:     Right eye: Normal extraocular motion and no nystagmus.     Left eye: Normal extraocular motion and no nystagmus.     Conjunctiva/sclera: Conjunctivae normal.     Right eye: Right conjunctiva is not injected. No chemosis, exudate or hemorrhage.    Left eye: Left conjunctiva is not injected. No chemosis, exudate or hemorrhage.    Pupils: Pupils are equal, round, and reactive to light. Pupils are equal.     Right eye: Pupil is round and reactive.     Left eye: Pupil is round and reactive.  Neck:     Musculoskeletal: Normal range of motion and neck supple. Normal range of motion. No edema, erythema, neck rigidity, crepitus, pain with movement, torticollis or muscular tenderness.     Thyroid: No thyroid mass or thyromegaly.     Trachea: Trachea and phonation normal. No tracheal tenderness or tracheal deviation.  Cardiovascular:     Rate and Rhythm: Normal rate and regular rhythm.     Chest Wall: PMI is not displaced.     Pulses:          Radial pulses are 2+ on the right side and 2+ on the left side.     Heart sounds: Normal heart sounds, S1 normal and S2 normal. No murmur. No friction rub. No gallop.   Pulmonary:     Effort: Pulmonary effort is normal. No accessory muscle usage or respiratory distress.     Breath sounds: Normal breath sounds  and air entry. No stridor, decreased air movement or transmitted upper airway sounds. No decreased breath sounds, wheezing, rhonchi or rales.     Comments: Wearing cloth mask due to covid 19 pandemic; smell of cigarette smoke with deep breaths; no cough observed in exam room; spoke full sentences without difficulty Chest:     Chest wall: No tenderness.  Abdominal:     General: Abdomen is flat. There is no distension.     Palpations: Abdomen is soft.  Musculoskeletal: Normal range of motion.        General: No swelling, tenderness, deformity or signs of injury.     Right shoulder: Normal.     Left shoulder: Normal.     Right elbow: Normal.    Left elbow: Normal.     Right hip: Normal.     Left hip: Normal.     Right knee: Normal.     Left knee: Normal.     Cervical back: Normal.     Right hand: Normal.     Left hand: Normal.     Right lower leg: No edema.     Left lower leg: No edema.  Lymphadenopathy:  Head:     Right side of head: No submental, submandibular, tonsillar, preauricular, posterior auricular or occipital adenopathy.     Left side of head: No submental, submandibular, tonsillar, preauricular, posterior auricular or occipital adenopathy.     Cervical: No cervical adenopathy.     Right cervical: No superficial, deep or posterior cervical adenopathy.    Left cervical: No superficial, deep or posterior cervical adenopathy.  Skin:    General: Skin is warm and dry.     Capillary Refill: Capillary refill takes less than 2 seconds.     Coloration: Skin is not ashen, cyanotic, jaundiced, mottled, pale or sallow.     Findings: No abrasion, abscess, acne, bruising, burn, ecchymosis, erythema, signs of injury, laceration, lesion, petechiae, rash or wound.     Nails: There is no clubbing.   Neurological:     General: No focal deficit present.     Mental Status: She is alert and oriented to person, place, and time. Mental status is at baseline. She is not disoriented.     GCS:  GCS eye subscore is 4. GCS verbal subscore is 5. GCS motor subscore is 6.     Cranial Nerves: Cranial nerves are intact. No cranial nerve deficit, dysarthria or facial asymmetry.     Sensory: Sensation is intact. No sensory deficit.     Motor: Motor function is intact. No weakness, tremor, atrophy, abnormal muscle tone or seizure activity.     Coordination: Coordination is intact. Coordination normal.     Gait: Gait is intact. Gait normal.     Comments: In/out of chair and on/off exam table without difficulty; gait sure and steady in warehouse/clinic; bilateral hand grasp equal 5/5  Psychiatric:        Attention and Perception: Attention and perception normal.        Mood and Affect: Mood and affect normal.        Speech: Speech normal.        Behavior: Behavior normal. Behavior is cooperative.        Thought Content: Thought content normal.        Cognition and Memory: Cognition and memory normal.        Judgment: Judgment normal.           Assessment & Plan:  A-eustachian tube dysfunction bilateral; tobacco use; acute rhinosinusitis  P- No evidence of invasive bacterial infection, non toxic and well hydrated.  I do not see where any further testing or imaging is necessary at this time.   I will suggest supportive care, rest, good hygiene and encourage the patient to take adequate fluids.  The patient is to return to clinic or EMERGENCY ROOM if symptoms worsen or change significantly e.g. ear pain, fever, purulent discharge from ears or bleeding.  Exitcare handout on eustachian tube dysfunction printed and given to patient.  Discussed with patient post nasal drip irritates throat/causes swelling blocks eustachian tubes from draining and fluid fills up middle ear.  Bacteria/viruses can grow in fluid and with moving head tube compressed and increases pressure in tube/ear worsening pain.  Studies show will take 30 days for fluid to resolve after post nasal drip controlled with nasal  steroid/antihistamine. Antibiotics and steroids do not speed up fluid removal.  Patient verbalized agreement and understanding of treatment plan and had no further questions at this time.  Start prednisone 10mg  taper po with breakfast (60/50/40/30/20/10) #21 Rf0 dispensed from [facility] to patient.  Restart flonase 1 spray each nostril BID, saline 2  sprays each nostril q2h wa prn congestion.  If no improvement with 48 hours of saline and flonase use start augmentin  po BID x 10 days #20 RF0 dispensed from [facility] to patient  Denied personal or family history of ENT cancer.  Shower BID especially prior to bed. No evidence of systemic bacterial infection, non toxic and well hydrated.  I do not see where any further testing or imaging is necessary at this time.   I will suggest supportive care, rest, good hygiene and encourage the patient to take adequate fluids.  The patient is to return to clinic if symptoms worsen or change significantly especially fever/no improvement in symptoms as covid testing may then be indicated.  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  Exitcare handout printed and given to patient on sinusitis and sinus rinse.  Patient verbalized agreement and understanding of treatment plan and had no further questions at this time.    Exitcare handout of tobacco printed and given to patient.  Encouraged her to cut down/stop. Discussed there are other things she could do for stress relief (meditation/exercise/hobby) Cold weather can increase rhinitis along with heat/HVAC.  Encouraged to ensure washing or using new mask every day and allowing to dry completely.  Patient to notify me if she wants smoking cessation class scheduled.  Patient verbalized understanding information and had no further questions at this time. P2:  Hand washing and cover cough

## 2019-07-29 NOTE — Patient Instructions (Signed)
Smoking Tobacco Information, Adult Smoking tobacco can be harmful to your health. Tobacco contains a poisonous (toxic), colorless chemical called nicotine. Nicotine is addictive. It changes the brain and can make it hard to stop smoking. Tobacco also has other toxic chemicals that can hurt your body and raise your risk of many cancers. How can smoking tobacco affect me? Smoking tobacco puts you at risk for:  Cancer. Smoking is most commonly associated with lung cancer, but can also lead to cancer in other parts of the body.  Chronic obstructive pulmonary disease (COPD). This is a long-term lung condition that makes it hard to breathe. It also gets worse over time.  High blood pressure (hypertension), heart disease, stroke, or heart attack.  Lung infections, such as pneumonia.  Cataracts. This is when the lenses in the eyes become clouded.  Digestive problems. This may include peptic ulcers, heartburn, and gastroesophageal reflux disease (GERD).  Oral health problems, such as gum disease and tooth loss.  Loss of taste and smell. Smoking can affect your appearance by causing:  Wrinkles.  Yellow or stained teeth, fingers, and fingernails. Smoking tobacco can also affect your social life, because:  It may be challenging to find places to smoke when away from home. Many workplaces, restaurants, hotels, and public places are tobacco-free.  Smoking is expensive. This is due to the cost of tobacco and the long-term costs of treating health problems from smoking.  Secondhand smoke may affect those around you. Secondhand smoke can cause lung cancer, breathing problems, and heart disease. Children of smokers have a higher risk for: ? Sudden infant death syndrome (SIDS). ? Ear infections. ? Lung infections. If you currently smoke tobacco, quitting now can help you:  Lead a longer and healthier life.  Look, smell, breathe, and feel better over time.  Save money.  Protect others from the  harms of secondhand smoke. What actions can I take to prevent health problems? Quit smoking   Do not start smoking. Quit if you already do.  Make a plan to quit smoking and commit to it. Look for programs to help you and ask your health care provider for recommendations and ideas.  Set a date and write down all the reasons you want to quit.  Let your friends and family know you are quitting so they can help and support you. Consider finding friends who also want to quit. It can be easier to quit with someone else, so that you can support each other.  Talk with your health care provider about using nicotine replacement medicines to help you quit, such as gum, lozenges, patches, sprays, or pills.  Do not replace cigarette smoking with electronic cigarettes, which are commonly called e-cigarettes. The safety of e-cigarettes is not known, and some may contain harmful chemicals.  If you try to quit but return to smoking, stay positive. It is common to slip up when you first quit, so take it one day at a time.  Be prepared for cravings. When you feel the urge to smoke, chew gum or suck on hard candy. Lifestyle  Stay busy and take care of your body.  Drink enough fluid to keep your urine pale yellow.  Get plenty of exercise and eat a healthy diet. This can help prevent weight gain after quitting.  Monitor your eating habits. Quitting smoking can cause you to have a larger appetite than when you smoke.  Find ways to relax. Go out with friends or family to a movie or a restaurant   where people do not smoke.  Ask your health care provider about having regular tests (screenings) to check for cancer. This may include blood tests, imaging tests, and other tests.  Find ways to manage your stress, such as meditation, yoga, or exercise. Where to find support To get support to quit smoking, consider:  Asking your health care provider for more information and resources.  Taking classes to learn  more about quitting smoking.  Looking for local organizations that offer resources about quitting smoking.  Joining a support group for people who want to quit smoking in your local community.  Calling the smokefree.gov counselor helpline: 1-800-Quit-Now 520-489-2971) Where to find more information You may find more information about quitting smoking from:  HelpGuide.org: www.helpguide.org  https://hall.com/: smokefree.gov  American Lung Association: www.lung.org Contact a health care provider if you:  Have problems breathing.  Notice that your lips, nose, or fingers turn blue.  Have chest pain.  Are coughing up blood.  Feel faint or you pass out.  Have other health changes that cause you to worry. Summary  Smoking tobacco can negatively affect your health, the health of those around you, your finances, and your social life.  Do not start smoking. Quit if you already do. If you need help quitting, ask your health care provider.  Think about joining a support group for people who want to quit smoking in your local community. There are many effective programs that will help you to quit this behavior. This information is not intended to replace advice given to you by your health care provider. Make sure you discuss any questions you have with your health care provider. Document Released: 08/27/2016 Document Revised: 10/01/2017 Document Reviewed: 08/27/2016 Elsevier Patient Education  [place]. Eustachian Tube Dysfunction  Eustachian tube dysfunction refers to a condition in which a blockage develops in the narrow passage that connects the middle ear to the back of the nose (eustachian tube). The eustachian tube regulates air pressure in the middle ear by letting air move between the ear and nose. It also helps to drain fluid from the middle ear space. Eustachian tube dysfunction can affect one or both ears. When the eustachian tube does not function properly, air  pressure, fluid, or both can build up in the middle ear. What are the causes? This condition occurs when the eustachian tube becomes blocked or cannot open normally. Common causes of this condition include:  Ear infections.  Colds and other infections that affect the nose, mouth, and throat (upper respiratory tract).  Allergies.  Irritation from cigarette smoke.  Irritation from stomach acid coming up into the esophagus (gastroesophageal reflux). The esophagus is the tube that carries food from the mouth to the stomach.  Sudden changes in air pressure, such as from descending in an airplane or scuba diving.  Abnormal growths in the nose or throat, such as: ? Growths that line the nose (nasal polyps). ? Abnormal growth of cells (tumors). ? Enlarged tissue at the back of the throat (adenoids). What increases the risk? You are more likely to develop this condition if:  You smoke.  You are overweight.  You are a child who has: ? Certain birth defects of the mouth, such as cleft palate. ? Large tonsils or adenoids. What are the signs or symptoms? Common symptoms of this condition include:  A feeling of fullness in the ear.  Ear pain.  Clicking or popping noises in the ear.  Ringing in the ear.  Hearing loss.  Loss  of balance.  Dizziness. Symptoms may get worse when the air pressure around you changes, such as when you travel to an area of high elevation, fly on an airplane, or go scuba diving. How is this diagnosed? This condition may be diagnosed based on:  Your symptoms.  A physical exam of your ears, nose, and throat.  Tests, such as those that measure: ? The movement of your eardrum (tympanogram). ? Your hearing (audiometry). How is this treated? Treatment depends on the cause and severity of your condition.  In mild cases, you may relieve your symptoms by moving air into your ears. This is called "popping the ears."  In more severe cases, or if you have  symptoms of fluid in your ears, treatment may include: ? Medicines to relieve congestion (decongestants). ? Medicines that treat allergies (antihistamines). ? Nasal sprays or ear drops that contain medicines that reduce swelling (steroids). ? A procedure to drain the fluid in your eardrum (myringotomy). In this procedure, a small tube is placed in the eardrum to:  Drain the fluid.  Restore the air in the middle ear space. ? A procedure to insert a balloon device through the nose to inflate the opening of the eustachian tube (balloon dilation). Follow these instructions at home: Lifestyle  Do not do any of the following until your health care provider approves: ? Travel to high altitudes. ? Fly in airplanes. ? Work in a Estate agent or room. ? Scuba dive.  Do not use any products that contain nicotine or tobacco, such as cigarettes and e-cigarettes. If you need help quitting, ask your health care provider.  Keep your ears dry. Wear fitted earplugs during showering and bathing. Dry your ears completely after. General instructions  Take over-the-counter and prescription medicines only as told by your health care provider.  Use techniques to help pop your ears as recommended by your health care provider. These may include: ? Chewing gum. ? Yawning. ? Frequent, forceful swallowing. ? Closing your mouth, holding your nose closed, and gently blowing as if you are trying to blow air out of your nose.  Keep all follow-up visits as told by your health care provider. This is important. Contact a health care provider if:  Your symptoms do not go away after treatment.  Your symptoms come back after treatment.  You are unable to pop your ears.  You have: ? A fever. ? Pain in your ear. ? Pain in your head or neck. ? Fluid draining from your ear.  Your hearing suddenly changes.  You become very dizzy.  You lose your balance. Summary  Eustachian tube dysfunction refers to a  condition in which a blockage develops in the eustachian tube.  It can be caused by ear infections, allergies, inhaled irritants, or abnormal growths in the nose or throat.  Symptoms include ear pain, hearing loss, or ringing in the ears.  Mild cases are treated with maneuvers to unblock the ears, such as yawning or ear popping.  Severe cases are treated with medicines. Surgery may also be done (rare). This information is not intended to replace advice given to you by your health care provider. Make sure you discuss any questions you have with your health care provider. Document Released: 09/08/2015 Document Revised: 12/02/2017 Document Reviewed: 12/02/2017 Elsevier Patient Education  2020 [employer] Sinusitis, Adult Sinusitis is inflammation of your sinuses. Sinuses are hollow spaces in the bones around your face. Your sinuses are located:  Around your eyes.  In the middle  of your forehead.  Behind your nose.  In your cheekbones. Mucus normally drains out of your sinuses. When your nasal tissues become inflamed or swollen, mucus can become trapped or blocked. This allows bacteria, viruses, and fungi to grow, which leads to infection. Most infections of the sinuses are caused by a virus. Sinusitis can develop quickly. It can last for up to 4 weeks (acute) or for more than 12 weeks (chronic). Sinusitis often develops after a cold. What are the causes? This condition is caused by anything that creates swelling in the sinuses or stops mucus from draining. This includes:  Allergies.  Asthma.  Infection from bacteria or viruses.  Deformities or blockages in your nose or sinuses.  Abnormal growths in the nose (nasal polyps).  Pollutants, such as chemicals or irritants in the air.  Infection from fungi (rare). What increases the risk? You are more likely to develop this condition if you:  Have a weak body defense system (immune system).  Do a lot of swimming or diving.   Overuse nasal sprays.  Smoke. What are the signs or symptoms? The main symptoms of this condition are pain and a feeling of pressure around the affected sinuses. Other symptoms include:  Stuffy nose or congestion.  Thick drainage from your nose.  Swelling and warmth over the affected sinuses.  Headache.  Upper toothache.  A cough that may get worse at night.  Extra mucus that collects in the throat or the back of the nose (postnasal drip).  Decreased sense of smell and taste.  Fatigue.  A fever.  Sore throat.  Bad breath. How is this diagnosed? This condition is diagnosed based on:  Your symptoms.  Your medical history.  A physical exam.  Tests to find out if your condition is acute or chronic. This may include: ? Checking your nose for nasal polyps. ? Viewing your sinuses using a device that has a light (endoscope). ? Testing for allergies or bacteria. ? Imaging tests, such as an MRI or CT scan. In rare cases, a bone biopsy may be done to rule out more serious types of fungal sinus disease. How is this treated? Treatment for sinusitis depends on the cause and whether your condition is chronic or acute.  If caused by a virus, your symptoms should go away on their own within 10 days. You may be given medicines to relieve symptoms. They include: ? Medicines that shrink swollen nasal passages (topical intranasal decongestants). ? Medicines that treat allergies (antihistamines). ? A spray that eases inflammation of the nostrils (topical intranasal corticosteroids). ? Rinses that help get rid of thick mucus in your nose (nasal saline washes).  If caused by bacteria, your health care provider may recommend waiting to see if your symptoms improve. Most bacterial infections will get better without antibiotic medicine. You may be given antibiotics if you have: ? A severe infection. ? A weak immune system.  If caused by narrow nasal passages or nasal polyps, you may  need to have surgery. Follow these instructions at home: Medicines  Take, use, or apply over-the-counter and prescription medicines only as told by your health care provider. These may include nasal sprays.  If you were prescribed an antibiotic medicine, take it as told by your health care provider. Do not stop taking the antibiotic even if you start to feel better. Hydrate and humidify   Drink enough fluid to keep your urine pale yellow. Staying hydrated will help to thin your mucus.  Use a cool  mist humidifier to keep the humidity level in your home above 50%.  Inhale steam for 10-15 minutes, 3-4 times a day, or as told by your health care provider. You can do this in the bathroom while a hot shower is running.  Limit your exposure to cool or dry air. Rest  Rest as much as possible.  Sleep with your head raised (elevated).  Make sure you get enough sleep each night. General instructions   Apply a warm, moist washcloth to your face 3-4 times a day or as told by your health care provider. This will help with discomfort.  Wash your hands often with soap and water to reduce your exposure to germs. If soap and water are not available, use hand sanitizer.  Do not smoke. Avoid being around people who are smoking (secondhand smoke).  Keep all follow-up visits as told by your health care provider. This is important. Contact a health care provider if:  You have a fever.  Your symptoms get worse.  Your symptoms do not improve within 10 days. Get help right away if:  You have a severe headache.  You have persistent vomiting.  You have severe pain or swelling around your face or eyes.  You have vision problems.  You develop confusion.  Your neck is stiff.  You have trouble breathing. Summary  Sinusitis is soreness and inflammation of your sinuses. Sinuses are hollow spaces in the bones around your face.  This condition is caused by nasal tissues that become inflamed  or swollen. The swelling traps or blocks the flow of mucus. This allows bacteria, viruses, and fungi to grow, which leads to infection.  If you were prescribed an antibiotic medicine, take it as told by your health care provider. Do not stop taking the antibiotic even if you start to feel better.  Keep all follow-up visits as told by your health care provider. This is important. This information is not intended to replace advice given to you by your health care provider. Make sure you discuss any questions you have with your health care provider. Document Released: 08/12/2005 Document Revised: 01/12/2018 Document Reviewed: 01/12/2018 Elsevier Patient Education  2020 [employer]. How to Perform a Sinus Rinse A sinus rinse is a home treatment that is used to rinse your sinuses with a sterile mixture of salt and water (saline solution). Sinuses are air-filled spaces in your skull behind the bones of your face and forehead that open into your nasal cavity. A sinus rinse can help to clear mucus, dirt, dust, or pollen from your nasal cavity. You may do a sinus rinse when you have a cold, a virus, nasal allergy symptoms, a sinus infection, or stuffiness in your nose or sinuses. Talk with your health care provider about whether a sinus rinse might help you. What are the risks? A sinus rinse is generally safe and effective. However, there are a few risks, which include:  A burning sensation in your sinuses. This may happen if you do not make the saline solution as directed. Be sure to follow all directions when making the saline solution.  Nasal irritation.  Infection from contaminated water. This is rare, but possible. Do not do a sinus rinse if you have had ear or nasal surgery, ear infection, or blocked ears. Supplies needed:  Saline solution or powder.  Distilled or sterile water may be needed to mix with saline powder. ? You may use boiled and cooled tap water. Boil tap water for 5 minutes;  cool until it is lukewarm. Use within 24 hours. ? Do not use regular tap water to mix with the saline solution.  Neti pot or nasal rinse bottle. These supplies release the saline solution into your nose and through your sinuses. Neti pots and nasal rinse bottles can be purchased at [employer], a health food store, or online. How to perform a sinus rinse  1. Wash your hands with soap and water. 2. Wash your device according to the directions that came with the product and then dry it. 3. Use the solution that comes with your product or one that is sold separately in stores. Follow the mixing directions on the package if you need to mix with sterile or distilled water. 4. Fill the device with the amount of saline solution noted in the device instructions. 5. Stand over a sink and tilt your head sideways over the sink. 6. Place the spout of the device in your upper nostril (the one closer to the ceiling). 7. Gently pour or squeeze the saline solution into your nasal cavity. The liquid should drain out from the lower nostril if you are not too congested. 8. While rinsing, breathe through your open mouth. 9. Gently blow your nose to clear any mucus and rinse solution. Blowing too hard may cause ear pain. 10. Repeat in your other nostril. 11. Clean and rinse your device with clean water and then air-dry it. Talk with your health care provider or pharmacist if you have questions about how to do a sinus rinse. Summary  A sinus rinse is a home treatment that is used to rinse your sinuses with a sterile mixture of salt and water (saline solution).  A sinus rinse is generally safe and effective. Follow all instructions carefully.  Before doing a sinus rinse, talk with your health care provider about whether it would be helpful for you. This information is not intended to replace advice given to you by your health care provider. Make sure you discuss any questions you have with your health care  provider. Document Released: 03/09/2014 Document Revised: 06/09/2017 Document Reviewed: 06/09/2017 Elsevier Patient Education  2020 [employer].

## 2019-08-19 ENCOUNTER — Telehealth: Payer: Self-pay | Admitting: Registered Nurse

## 2019-08-19 ENCOUNTER — Encounter: Payer: Self-pay | Admitting: Registered Nurse

## 2019-08-19 DIAGNOSIS — R05 Cough: Secondary | ICD-10-CM

## 2019-08-19 DIAGNOSIS — J301 Allergic rhinitis due to pollen: Secondary | ICD-10-CM

## 2019-08-19 DIAGNOSIS — R059 Cough, unspecified: Secondary | ICD-10-CM

## 2019-08-19 MED ORDER — BENZONATATE 200 MG PO CAPS: 200.0000 mg | ORAL_CAPSULE | Freq: Two times a day (BID) | ORAL | 1 refills | Status: DC | PRN

## 2019-08-19 MED ORDER — FLUTICASONE PROPIONATE 50 MCG/ACT NA SUSP: 1.0000 | Freq: Two times a day (BID) | NASAL | 1 refills | Status: DC

## 2019-08-19 NOTE — Telephone Encounter (Signed)
Patient requested refill of tessalon pearles 200mg  po TID prn cough #60 RF1 electronic Rx to [place], [place]

## 2019-09-16 ENCOUNTER — Other Ambulatory Visit: Payer: Self-pay | Admitting: Registered Nurse

## 2019-11-18 ENCOUNTER — Telehealth: Payer: Self-pay | Admitting: Registered Nurse

## 2019-11-18 DIAGNOSIS — J301 Allergic rhinitis due to pollen: Secondary | ICD-10-CM

## 2019-11-18 MED ORDER — MONTELUKAST SODIUM 10 MG PO TABS: 10.0000 mg | ORAL_TABLET | Freq: Every day | ORAL | 3 refills | Status: DC

## 2019-11-18 NOTE — Telephone Encounter (Signed)
Patient requested refill singulair 10mg  po qhs #30 RF3 to [facility] on [address] in [place].  Last Rx 2019 per Epic review.  Electronic Rx sent to her pharmacy of choice.  Patient verbalized understanding information/instructions, agreed with plan of care and had no further questions at this time.

## 2019-12-14 ENCOUNTER — Encounter: Payer: Self-pay | Admitting: Registered Nurse

## 2019-12-14 ENCOUNTER — Ambulatory Visit: Payer: Self-pay | Admitting: Registered Nurse

## 2019-12-14 ENCOUNTER — Other Ambulatory Visit: Payer: Self-pay

## 2019-12-14 VITALS — BP 76/55 | HR 66 | Temp 98.0°F | Resp 16

## 2019-12-14 DIAGNOSIS — F17210 Nicotine dependence, cigarettes, uncomplicated: Secondary | ICD-10-CM

## 2019-12-14 DIAGNOSIS — J0101 Acute recurrent maxillary sinusitis: Secondary | ICD-10-CM

## 2019-12-14 MED ORDER — AMOXICILLIN-POT CLAVULANATE 875-125 MG PO TABS: 1.0000 | ORAL_TABLET | Freq: Two times a day (BID) | ORAL | 0 refills | Status: AC

## 2019-12-14 MED ORDER — SALINE SPRAY 0.65 % NA SOLN: 2.0000 | NASAL | 0 refills | Status: DC

## 2019-12-14 MED ORDER — PREDNISONE 10 MG (21) PO TBPK: ORAL_TABLET | ORAL | 0 refills | Status: DC

## 2019-12-14 MED ORDER — IBUPROFEN 800 MG PO TABS: 800.0000 mg | ORAL_TABLET | Freq: Three times a day (TID) | ORAL | 0 refills | Status: AC | PRN

## 2019-12-14 NOTE — Patient Instructions (Signed)
Smoking Tobacco Information, Adult Smoking tobacco can be harmful to your health. Tobacco contains a poisonous (toxic), colorless chemical called nicotine. Nicotine is addictive. It changes the brain and can make it hard to stop smoking. Tobacco also has other toxic chemicals that can hurt your body and raise your risk of many cancers. How can smoking tobacco affect me? Smoking tobacco puts you at risk for:  Cancer. Smoking is most commonly associated with lung cancer, but can also lead to cancer in other parts of the body.  Chronic obstructive pulmonary disease (COPD). This is a long-term lung condition that makes it hard to breathe. It also gets worse over time.  High blood pressure (hypertension), heart disease, stroke, or heart attack.  Lung infections, such as pneumonia.  Cataracts. This is when the lenses in the eyes become clouded.  Digestive problems. This may include peptic ulcers, heartburn, and gastroesophageal reflux disease (GERD).  Oral health problems, such as gum disease and tooth loss.  Loss of taste and smell. Smoking can affect your appearance by causing:  Wrinkles.  Yellow or stained teeth, fingers, and fingernails. Smoking tobacco can also affect your social life, because:  It may be challenging to find places to smoke when away from home. Many workplaces, restaurants, hotels, and public places are tobacco-free.  Smoking is expensive. This is due to the cost of tobacco and the long-term costs of treating health problems from smoking.  Secondhand smoke may affect those around you. Secondhand smoke can cause lung cancer, breathing problems, and heart disease. Children of smokers have a higher risk for: ? Sudden infant death syndrome (SIDS). ? Ear infections. ? Lung infections. If you currently smoke tobacco, quitting now can help you:  Lead a longer and healthier life.  Look, smell, breathe, and feel better over time.  Save money.  Protect others from the  harms of secondhand smoke. What actions can I take to prevent health problems? Quit smoking   Do not start smoking. Quit if you already do.  Make a plan to quit smoking and commit to it. Look for programs to help you and ask your health care provider for recommendations and ideas.  Set a date and write down all the reasons you want to quit.  Let your friends and family know you are quitting so they can help and support you. Consider finding friends who also want to quit. It can be easier to quit with someone else, so that you can support each other.  Talk with your health care provider about using nicotine replacement medicines to help you quit, such as gum, lozenges, patches, sprays, or pills.  Do not replace cigarette smoking with electronic cigarettes, which are commonly called e-cigarettes. The safety of e-cigarettes is not known, and some may contain harmful chemicals.  If you try to quit but return to smoking, stay positive. It is common to slip up when you first quit, so take it one day at a time.  Be prepared for cravings. When you feel the urge to smoke, chew gum or suck on hard candy. Lifestyle  Stay busy and take care of your body.  Drink enough fluid to keep your urine pale yellow.  Get plenty of exercise and eat a healthy diet. This can help prevent weight gain after quitting.  Monitor your eating habits. Quitting smoking can cause you to have a larger appetite than when you smoke.  Find ways to relax. Go out with friends or family to a movie or a restaurant   where people do not smoke.  Ask your health care provider about having regular tests (screenings) to check for cancer. This may include blood tests, imaging tests, and other tests.  Find ways to manage your stress, such as meditation, yoga, or exercise. Where to find support To get support to quit smoking, consider:  Asking your health care provider for more information and resources.  Taking classes to learn  more about quitting smoking.  Looking for local organizations that offer resources about quitting smoking.  Joining a support group for people who want to quit smoking in your local community.  Calling the smokefree.gov counselor helpline: 1-800-Quit-Now (603)311-2757) Where to find more information You may find more information about quitting smoking from:  HelpGuide.org: www.helpguide.org  https://hall.com/: smokefree.gov  American Lung Association: www.lung.org Contact a health care provider if you:  Have problems breathing.  Notice that your lips, nose, or fingers turn blue.  Have chest pain.  Are coughing up blood.  Feel faint or you pass out.  Have other health changes that cause you to worry. Summary  Smoking tobacco can negatively affect your health, the health of those around you, your finances, and your social life.  Do not start smoking. Quit if you already do. If you need help quitting, ask your health care provider.  Think about joining a support group for people who want to quit smoking in your local community. There are many effective programs that will help you to quit this behavior. This information is not intended to replace advice given to you by your health care provider. Make sure you discuss any questions you have with your health care provider. Document Revised: 05/07/2019 Document Reviewed: 08/27/2016 Elsevier Patient Education  [place]. Sinusitis, Adult Sinusitis is inflammation of your sinuses. Sinuses are hollow spaces in the bones around your face. Your sinuses are located:  Around your eyes.  In the middle of your forehead.  Behind your nose.  In your cheekbones. Mucus normally drains out of your sinuses. When your nasal tissues become inflamed or swollen, mucus can become trapped or blocked. This allows bacteria, viruses, and fungi to grow, which leads to infection. Most infections of the sinuses are caused by a virus. Sinusitis  can develop quickly. It can last for up to 4 weeks (acute) or for more than 12 weeks (chronic). Sinusitis often develops after a cold. What are the causes? This condition is caused by anything that creates swelling in the sinuses or stops mucus from draining. This includes:  Allergies.  Asthma.  Infection from bacteria or viruses.  Deformities or blockages in your nose or sinuses.  Abnormal growths in the nose (nasal polyps).  Pollutants, such as chemicals or irritants in the air.  Infection from fungi (rare). What increases the risk? You are more likely to develop this condition if you:  Have a weak body defense system (immune system).  Do a lot of swimming or diving.  Overuse nasal sprays.  Smoke. What are the signs or symptoms? The main symptoms of this condition are pain and a feeling of pressure around the affected sinuses. Other symptoms include:  Stuffy nose or congestion.  Thick drainage from your nose.  Swelling and warmth over the affected sinuses.  Headache.  Upper toothache.  A cough that may get worse at night.  Extra mucus that collects in the throat or the back of the nose (postnasal drip).  Decreased sense of smell and taste.  Fatigue.  A fever.  Sore throat.  Bad breath. How is this diagnosed? This condition is diagnosed based on:  Your symptoms.  Your medical history.  A physical exam.  Tests to find out if your condition is acute or chronic. This may include: ? Checking your nose for nasal polyps. ? Viewing your sinuses using a device that has a light (endoscope). ? Testing for allergies or bacteria. ? Imaging tests, such as an MRI or CT scan. In rare cases, a bone biopsy may be done to rule out more serious types of fungal sinus disease. How is this treated? Treatment for sinusitis depends on the cause and whether your condition is chronic or acute.  If caused by a virus, your symptoms should go away on their own within 10  days. You may be given medicines to relieve symptoms. They include: ? Medicines that shrink swollen nasal passages (topical intranasal decongestants). ? Medicines that treat allergies (antihistamines). ? A spray that eases inflammation of the nostrils (topical intranasal corticosteroids). ? Rinses that help get rid of thick mucus in your nose (nasal saline washes).  If caused by bacteria, your health care provider may recommend waiting to see if your symptoms improve. Most bacterial infections will get better without antibiotic medicine. You may be given antibiotics if you have: ? A severe infection. ? A weak immune system.  If caused by narrow nasal passages or nasal polyps, you may need to have surgery. Follow these instructions at home: Medicines  Take, use, or apply over-the-counter and prescription medicines only as told by your health care provider. These may include nasal sprays.  If you were prescribed an antibiotic medicine, take it as told by your health care provider. Do not stop taking the antibiotic even if you start to feel better. Hydrate and humidify   Drink enough fluid to keep your urine pale yellow. Staying hydrated will help to thin your mucus.  Use a cool mist humidifier to keep the humidity level in your home above 50%.  Inhale steam for 10-15 minutes, 3-4 times a day, or as told by your health care provider. You can do this in the bathroom while a hot shower is running.  Limit your exposure to cool or dry air. Rest  Rest as much as possible.  Sleep with your head raised (elevated).  Make sure you get enough sleep each night. General instructions   Apply a warm, moist washcloth to your face 3-4 times a day or as told by your health care provider. This will help with discomfort.  Wash your hands often with soap and water to reduce your exposure to germs. If soap and water are not available, use hand sanitizer.  Do not smoke. Avoid being around people who  are smoking (secondhand smoke).  Keep all follow-up visits as told by your health care provider. This is important. Contact a health care provider if:  You have a fever.  Your symptoms get worse.  Your symptoms do not improve within 10 days. Get help right away if:  You have a severe headache.  You have persistent vomiting.  You have severe pain or swelling around your face or eyes.  You have vision problems.  You develop confusion.  Your neck is stiff.  You have trouble breathing. Summary  Sinusitis is soreness and inflammation of your sinuses. Sinuses are hollow spaces in the bones around your face.  This condition is caused by nasal tissues that become inflamed or swollen. The swelling traps or blocks the flow of mucus. This allows  bacteria, viruses, and fungi to grow, which leads to infection.  If you were prescribed an antibiotic medicine, take it as told by your health care provider. Do not stop taking the antibiotic even if you start to feel better.  Keep all follow-up visits as told by your health care provider. This is important. This information is not intended to replace advice given to you by your health care provider. Make sure you discuss any questions you have with your health care provider. Document Revised: 01/12/2018 Document Reviewed: 01/12/2018 Elsevier Patient Education  2020 [employer]. How to Perform a Sinus Rinse A sinus rinse is a home treatment that is used to rinse your sinuses with a sterile mixture of salt and water (saline solution). Sinuses are air-filled spaces in your skull behind the bones of your face and forehead that open into your nasal cavity. A sinus rinse can help to clear mucus, dirt, dust, or pollen from your nasal cavity. You may do a sinus rinse when you have a cold, a virus, nasal allergy symptoms, a sinus infection, or stuffiness in your nose or sinuses. Talk with your health care provider about whether a sinus rinse might help  you. What are the risks? A sinus rinse is generally safe and effective. However, there are a few risks, which include:  A burning sensation in your sinuses. This may happen if you do not make the saline solution as directed. Be sure to follow all directions when making the saline solution.  Nasal irritation.  Infection from contaminated water. This is rare, but possible. Do not do a sinus rinse if you have had ear or nasal surgery, ear infection, or blocked ears. Supplies needed:  Saline solution or powder.  Distilled or sterile water may be needed to mix with saline powder. ? You may use boiled and cooled tap water. Boil tap water for 5 minutes; cool until it is lukewarm. Use within 24 hours. ? Do not use regular tap water to mix with the saline solution.  Neti pot or nasal rinse bottle. These supplies release the saline solution into your nose and through your sinuses. Neti pots and nasal rinse bottles can be purchased at [employer], a health food store, or online. How to perform a sinus rinse  1. Wash your hands with soap and water. 2. Wash your device according to the directions that came with the product and then dry it. 3. Use the solution that comes with your product or one that is sold separately in stores. Follow the mixing directions on the package if you need to mix with sterile or distilled water. 4. Fill the device with the amount of saline solution noted in the device instructions. 5. Stand over a sink and tilt your head sideways over the sink. 6. Place the spout of the device in your upper nostril (the one closer to the ceiling). 7. Gently pour or squeeze the saline solution into your nasal cavity. The liquid should drain out from the lower nostril if you are not too congested. 8. While rinsing, breathe through your open mouth. 9. Gently blow your nose to clear any mucus and rinse solution. Blowing too hard may cause ear pain. 10. Repeat in your other nostril.  11. Clean and rinse your device with clean water and then air-dry it. Talk with your health care provider or pharmacist if you have questions about how to do a sinus rinse. Summary  A sinus rinse is a home treatment that is used  to rinse your sinuses with a sterile mixture of salt and water (saline solution).  A sinus rinse is generally safe and effective. Follow all instructions carefully.  Before doing a sinus rinse, talk with your health care provider about whether it would be helpful for you. This information is not intended to replace advice given to you by your health care provider. Make sure you discuss any questions you have with your health care provider. Document Revised: 06/09/2017 Document Reviewed: 06/09/2017 Elsevier Patient Education  2020 [employer].

## 2019-12-14 NOTE — Progress Notes (Signed)
Subjective:    Patient ID: Rebecca Sweeney, female    DOB: 1960-10-01, 59 y.o.   MRN: [id number]  59y/o caucasian female established patient here for sinus pain/pressure, yellow cloudy mucous thick and frequent from nose.  I thought my headache was from moderna vaccine number last Tuesday 12/07/2019 but then I started having this yellow mucous.  Last sinusitis Dec 2020 given prednisone/ibuprofen and augmentin and cleared symptoms.  Patient still smoking doesn't want to stop.  Seasonal allergies bothering her also.  Denied being exposed to known covid + persons has been social distancing, wearing mask at work and frequent hand washing.  Denied fever/chills, ear pain/discharge/n/v/d or body aches.     Review of Systems  Constitutional: Negative for activity change, appetite change, chills, diaphoresis, fatigue, fever and unexpected weight change.  HENT: Positive for congestion, nosebleeds, postnasal drip, rhinorrhea, sinus pressure, sinus pain and sneezing. Negative for dental problem, drooling, ear discharge, ear pain, facial swelling, hearing loss, mouth sores, sore throat, tinnitus, trouble swallowing and voice change.   Eyes: Negative for photophobia, pain, discharge, redness, itching and visual disturbance.  Respiratory: Negative for cough, choking, chest tightness, shortness of breath, wheezing and stridor.   Cardiovascular: Negative for chest pain, palpitations and leg swelling.  Gastrointestinal: Negative for abdominal distention, abdominal pain, blood in stool, constipation, diarrhea, nausea and vomiting.  Endocrine: Negative for cold intolerance and heat intolerance.  Genitourinary: Negative for difficulty urinating, dysuria and hematuria.  Musculoskeletal: Negative for arthralgias, back pain, gait problem, joint swelling, myalgias, neck pain and neck stiffness.  Skin: Negative for color change, pallor, rash and wound.  Allergic/Immunologic: Positive for environmental allergies. Negative  for food allergies.  Neurological: Positive for headaches. Negative for dizziness, tremors, seizures, syncope, facial asymmetry, speech difficulty, weakness, light-headedness and numbness.  Hematological: Negative for adenopathy. Does not bruise/bleed easily.  Psychiatric/Behavioral: Negative for agitation, behavioral problems, confusion and sleep disturbance.       Objective:   Physical Exam Vitals and nursing note reviewed.  Constitutional:      General: She is awake. She is not in acute distress.    Appearance: Normal appearance. She is well-developed, well-groomed and normal weight. She is ill-appearing. She is not toxic-appearing or diaphoretic.  HENT:     Head: Normocephalic and atraumatic.     Jaw: There is normal jaw occlusion. No trismus, tenderness, swelling, pain on movement or malocclusion.     Salivary Glands: Right salivary gland is not diffusely enlarged or tender. Left salivary gland is not diffusely enlarged or tender.     Right Ear: Hearing, ear canal and external ear normal. A middle ear effusion is present. There is no impacted cerumen.     Left Ear: Hearing, ear canal and external ear normal. A middle ear effusion is present. There is no impacted cerumen.     Nose: Mucosal edema, congestion and rhinorrhea present. No nasal deformity, septal deviation, signs of injury, laceration or nasal tenderness.     Right Turbinates: Enlarged and swollen. Not pale.     Left Turbinates: Enlarged and swollen. Not pale.     Right Sinus: Maxillary sinus tenderness and frontal sinus tenderness present.     Left Sinus: Maxillary sinus tenderness and frontal sinus tenderness present.     Mouth/Throat:     Lips: Pink. No lesions.     Mouth: Mucous membranes are moist. Mucous membranes are not pale, not dry and not cyanotic. No injury, lacerations, oral lesions or angioedema.     Dentition: Abnormal dentition.  Has dentures. No dental tenderness, gingival swelling, dental caries, dental  abscesses or gum lesions.     Tongue: No lesions. Tongue does not deviate from midline.     Palate: No mass and lesions.     Pharynx: Uvula midline. Pharyngeal swelling and posterior oropharyngeal erythema present. No oropharyngeal exudate or uvula swelling.     Tonsils: No tonsillar exudate or tonsillar abscesses. 0 on the right. 0 on the left.     Comments: Maxillary greater than frontal TTP sinuses; bilateral allergic shiners; cobblestoning posterior pharynx; bilateral TMS air fluid level clear; frequent nasal sniffing/throat clearing in exam room Eyes:     General: Lids are normal. Allergic shiner present. No visual field deficit or scleral icterus.       Right eye: No foreign body, discharge or hordeolum.        Left eye: No foreign body, discharge or hordeolum.     Extraocular Movements: Extraocular movements intact.     Right eye: Normal extraocular motion and no nystagmus.     Left eye: Normal extraocular motion and no nystagmus.     Conjunctiva/sclera: Conjunctivae normal.     Right eye: Right conjunctiva is not injected. No chemosis, exudate or hemorrhage.    Left eye: Left conjunctiva is not injected. No chemosis, exudate or hemorrhage.    Pupils: Pupils are equal, round, and reactive to light. Pupils are equal.     Right eye: Pupil is round and reactive.     Left eye: Pupil is round and reactive.  Neck:     Thyroid: No thyroid mass, thyromegaly or thyroid tenderness.     Trachea: Trachea and phonation normal. No tracheal tenderness or tracheal deviation.  Cardiovascular:     Rate and Rhythm: Normal rate and regular rhythm.     Chest Wall: PMI is not displaced.     Pulses: Normal pulses.          Radial pulses are 2+ on the right side and 2+ on the left side.     Heart sounds: Normal heart sounds, S1 normal and S2 normal. Heart sounds not distant. No murmur. No friction rub. No gallop.   Pulmonary:     Effort: Pulmonary effort is normal. No accessory muscle usage or  respiratory distress.     Breath sounds: Normal breath sounds and air entry. No stridor, decreased air movement or transmitted upper airway sounds. No decreased breath sounds, wheezing, rhonchi or rales.     Comments: Wearing cloth mask due to covid 19 pandemic; spoke full sentences without difficulty no cough observed in exam room Chest:     Chest wall: No tenderness.  Abdominal:     General: Abdomen is flat. There is no distension.     Palpations: Abdomen is soft.  Musculoskeletal:        General: No swelling or tenderness. Normal range of motion.     Right shoulder: Normal.     Left shoulder: Normal.     Right elbow: Normal.     Left elbow: Normal.     Right hand: Normal.     Left hand: Normal.     Cervical back: Normal, normal range of motion and neck supple. No swelling, edema, deformity, erythema, signs of trauma, lacerations, rigidity, torticollis or crepitus. No pain with movement. Normal range of motion.     Thoracic back: Normal.     Lumbar back: Normal.     Right hip: Normal.     Left hip: Normal.  Right knee: Normal.     Left knee: Normal.  Lymphadenopathy:     Head:     Right side of head: No submental, submandibular, tonsillar, preauricular, posterior auricular or occipital adenopathy.     Left side of head: No submental, submandibular, tonsillar, preauricular, posterior auricular or occipital adenopathy.     Cervical: No cervical adenopathy.     Right cervical: No superficial, deep or posterior cervical adenopathy.    Left cervical: No superficial, deep or posterior cervical adenopathy.  Skin:    General: Skin is warm and dry.     Capillary Refill: Capillary refill takes less than 2 seconds.     Coloration: Skin is not ashen, cyanotic, jaundiced, mottled, pale or sallow.     Findings: No abrasion, abscess, acne, bruising, burn, ecchymosis, erythema, signs of injury, laceration, lesion, petechiae, rash or wound.     Nails: There is no clubbing.  Neurological:      General: No focal deficit present.     Mental Status: She is alert and oriented to person, place, and time. Mental status is at baseline. She is not disoriented.     GCS: GCS eye subscore is 4. GCS verbal subscore is 5. GCS motor subscore is 6.     Cranial Nerves: Cranial nerves are intact. No cranial nerve deficit, dysarthria or facial asymmetry.     Sensory: Sensation is intact. No sensory deficit.     Motor: Motor function is intact. No weakness, tremor, atrophy, abnormal muscle tone or seizure activity.     Coordination: Coordination is intact. Coordination normal.     Gait: Gait is intact. Gait normal.     Comments: Bilateral hand grasp equal 5/5; in/out of chair and on/off exam table without difficulty; gait sure and steady in hallway  Psychiatric:        Attention and Perception: Attention and perception normal.        Mood and Affect: Mood and affect normal.        Speech: Speech normal.        Behavior: Behavior normal. Behavior is cooperative.        Thought Content: Thought content normal.        Cognition and Memory: Cognition and memory normal.        Judgment: Judgment normal.           Assessment & Plan:  A-acute recurrent maxillary sinusitis, nicotine dependence cigarettes  P- Discussed drink more water today as increased water loss with mouth breathing/post nasal drip/mucous and BP lower than usual today.  Start prednisone 10mg  taper po with breakfast (60/50/40/30/20/10) #21 RF0 dispensed from [facility] to patient.  Prednisone 10mg  po taper (60/50/40/30/20/10) po daily with breakfast if no improvement with augmentin x 48 hours #21 RF0 dispensed from [facility] to patient. Continue flonase 1 spray each nostril BID, saline 2 sprays each nostril q2h wa prn congestion. Start augmentin 875mg  po BID x 10 days #20 RF0 dispensed from [facility] to patient  Denied personal or family history of ENT cancer.  Shower BID especially prior to bed. No evidence of systemic bacterial infection, non toxic  and well hydrated.  I do not see where any further testing or imaging is necessary at this time.   I will suggest supportive care, rest, good hygiene and encourage the patient to take adequate fluids.  The patient is to return to clinic if symptoms worsen or change significantly especially fever/no improvement in symptoms as covid testing may then be indicated.  Patient  with history of recurrent sinusitis/bilateral otic effusion this time of year.  Exitcare handout on sinusitis and sinus rinse.  Patient verbalized agreement and understanding of treatment plan and had no further questions at this time.    Patient may use normal saline nasal spray 2 sprays each nostril q2h wa as needed. flonase 1 spray each nostril BID.  Patient denied personal or family history of ENT cancer.  OTC antihistamine of choice claritin 10mg  po daily. singulair 10mg  po qhs.  Avoid triggers if possible.  Shower prior to bedtime if exposed to triggers.  If allergic dust/dust mites recommend mattress/pillow covers/encasements; washing linens, vacuuming, sweeping, dusting weekly.  Call or return to clinic as needed if these symptoms worsen or fail to improve as anticipated. Patient verbalized understanding of instructions, agreed with plan of care and had no further questions at this time.  P2:  Avoidance and hand washing.  Exitcare handout of tobacco use.  Encouraged her to cut down/stop.  Not ready at this timePatient to notify me if she wants smoking cessation class scheduled.  Patient verbalized understanding information and had no further questions at this time. P2:  Hand washing and cover cough

## 2020-01-06 ENCOUNTER — Encounter: Payer: Self-pay | Admitting: Physician Assistant

## 2020-01-06 ENCOUNTER — Ambulatory Visit: Payer: Self-pay | Admitting: Physician Assistant

## 2020-01-06 ENCOUNTER — Other Ambulatory Visit: Payer: Self-pay

## 2020-01-06 VITALS — BP 99/68 | HR 86 | Temp 98.3°F

## 2020-01-06 DIAGNOSIS — J302 Other seasonal allergic rhinitis: Secondary | ICD-10-CM

## 2020-01-06 DIAGNOSIS — J329 Chronic sinusitis, unspecified: Secondary | ICD-10-CM

## 2020-01-06 DIAGNOSIS — F17200 Nicotine dependence, unspecified, uncomplicated: Secondary | ICD-10-CM

## 2020-01-06 NOTE — Progress Notes (Signed)
59y/o Caucasian established female pt c/o sinusitis sx starting yesterday. No cough. Frontal and maxillary sinus pain/pressure, sinus HA, eyes burning. Has been taking all allergy meds as Rx'd, Flonase, Singulair, Claritin, Saline nasal spray, zinc. Took phenylephrine 10mg  at approx 1000 today. Reports some relief with that.   Subjective:    Patient ID: Rebecca Sweeney, female    DOB: 1961-05-08, 59 y.o.   MRN: [id number]  HPI  As noted above. Frontal sinus pressure,  No fever, no obvious symptoms c/w eustachian tube dysfunction. . No periorbital discomfort. Eyes feel dry-blinking a lot. 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 She stopped soda 10 years ago--for the diagnosis- reviewed negative impact smoking VH:QION    Objective:   Physical Exam Vitals and nursing note reviewed.  Constitutional:      Comments: Thin female appearing older than stated age . Faciies c/w long term smoking , sun damage  HENT:     Head: Normocephalic and atraumatic.     Right Ear: Tympanic membrane and ear canal normal.     Left Ear: Tympanic membrane and ear canal normal.     Nose: Nose normal.     Comments: Boggy mucosa    Mouth/Throat:     Mouth: Mucous membranes are moist.     Comments: erythema Eyes:     Extraocular Movements: Extraocular movements intact.  Cardiovascular:     Rate and Rhythm: Normal rate and regular rhythm.     Pulses: Normal pulses.  Pulmonary:     Effort: Pulmonary effort is normal. No respiratory distress.   Breath sounds: Normal breath sounds. No wheezing or rhonchi.     Comments: Fields clear to asculatation Abdominal:     General: Abdomen is flat.     Palpations: Abdomen is soft.  Musculoskeletal:        General: Normal range of motion.     Cervical back: Normal range of motion and neck supple. No tenderness.  Lymphadenopathy:     Cervical: No cervical adenopathy.  Skin:    General: Skin is warm and dry.  Neurological:     General: No focal deficit present.     Mental Status: She is alert.  Psychiatric:        Mood and Affect: Mood normal.     Assessment & Plan:  Chronic rhinosinusitis without evidence of acute infection at this time  . Treat symptommatically Continue meds as previously  Ordered. Phenylephrine sample this AM on trial by staff  Chronic tobacco abuse; seasonal allergies Reviewed therapeutics like warm rice sock to forehead at home Smoke reduction techniques Shower and hair wash before bed Cover pillowcase and wash in AM Increase water to 6-8 bottles per day , add Robitussin 1-2 tsp q 4-6 hours to help liquify mucous and minimize congestion sinuses.  She is not a candidate for ATB to my evaluation at this time-- and note  She does NOT want atb Rx at this time Encourage follow up in clinic tomorrow before weekend. VS check , Sp02, status update, weight  check Has labs due soon  Need to consider CXR with future lung excerbation. Last on record 2009 Lifetime smoke Baby granddaughter had influence recently asking her to"stop smoking so she wouldn't get sick or die" Rec all cigarette d/c at halfway mark, and discard- don't worry about"wasting ", consider the saving of her health. Do not increase daily allowance of 10- next week decrease daily by 1-2 cigarettes and continue progressively. ProActive encouraged

## 2020-01-10 ENCOUNTER — Ambulatory Visit: Payer: Self-pay | Admitting: *Deleted

## 2020-01-10 ENCOUNTER — Other Ambulatory Visit: Payer: Self-pay

## 2020-01-10 VITALS — BP 113/75 | HR 67 | Wt 151.0 lb

## 2020-01-10 DIAGNOSIS — E7800 Pure hypercholesterolemia, unspecified: Secondary | ICD-10-CM

## 2020-01-10 DIAGNOSIS — Z0000 Encounter for general adult medical examination without abnormal findings: Secondary | ICD-10-CM

## 2020-01-10 DIAGNOSIS — E559 Vitamin D deficiency, unspecified: Secondary | ICD-10-CM

## 2020-01-10 NOTE — Progress Notes (Signed)
Fasting labs. Overdue since 2019. Will route to pcp/Gyn Zelphia Cairo MD upon receipt.

## 2020-01-11 LAB — CMP12+LP+TP+TSH+6AC+CBC/D/PLT
ALT: 19 IU/L (ref 0–32)
AST: 17 IU/L (ref 0–40)
Albumin/Globulin Ratio: 2.1 (ref 1.2–2.2)
Albumin: 4.5 g/dL (ref 3.8–4.9)
Alkaline Phosphatase: 49 IU/L (ref 48–121)
BUN/Creatinine Ratio: 10 (ref 9–23)
BUN: 8 mg/dL (ref 6–24)
Basophils Absolute: 0 10*3/uL (ref 0.0–0.2)
Basos: 0 %
Bilirubin Total: 0.4 mg/dL (ref 0.0–1.2)
Calcium: 9.3 mg/dL (ref 8.7–10.2)
Chloride: 105 mmol/L (ref 96–106)
Chol/HDL Ratio: 6 ratio — ABNORMAL HIGH (ref 0.0–4.4)
Cholesterol, Total: 270 mg/dL — ABNORMAL HIGH (ref 100–199)
Creatinine, Ser: 0.81 mg/dL (ref 0.57–1.00)
EOS (ABSOLUTE): 0.1 10*3/uL (ref 0.0–0.4)
Eos: 2 %
Estimated CHD Risk: 1.7 times avg. — ABNORMAL HIGH (ref 0.0–1.0)
Free Thyroxine Index: 2.3 (ref 1.2–4.9)
GFR calc Af Amer: 93 mL/min/{1.73_m2} (ref >59)
GFR calc non Af Amer: 80 mL/min/{1.73_m2} (ref >59)
GGT: 14 IU/L (ref 0–60)
Globulin, Total: 2.1 g/dL (ref 1.5–4.5)
Glucose: 88 mg/dL (ref 65–99)
HDL: 45 mg/dL (ref >39)
Hematocrit: 37.4 % (ref 34.0–46.6)
Hemoglobin: 12.6 g/dL (ref 11.1–15.9)
Immature Grans (Abs): 0 10*3/uL (ref 0.0–0.1)
Immature Granulocytes: 0 %
Iron: 119 ug/dL (ref 27–159)
LDH: 166 IU/L (ref 119–226)
LDL Chol Calc (NIH): 200 mg/dL — ABNORMAL HIGH (ref 0–99)
Lymphocytes Absolute: 3.3 10*3/uL — ABNORMAL HIGH (ref 0.7–3.1)
Lymphs: 49 %
MCH: 33.5 pg — ABNORMAL HIGH (ref 26.6–33.0)
MCHC: 33.7 g/dL (ref 31.5–35.7)
MCV: 100 fL — ABNORMAL HIGH (ref 79–97)
Monocytes Absolute: 0.3 10*3/uL (ref 0.1–0.9)
Monocytes: 5 %
Neutrophils Absolute: 2.9 10*3/uL (ref 1.4–7.0)
Neutrophils: 44 %
Phosphorus: 2.6 mg/dL — ABNORMAL LOW (ref 3.0–4.3)
Platelets: 334 10*3/uL (ref 150–450)
Potassium: 3.8 mmol/L (ref 3.5–5.2)
RBC: 3.76 x10E6/uL — ABNORMAL LOW (ref 3.77–5.28)
RDW: 11.9 % (ref 11.7–15.4)
Sodium: 138 mmol/L (ref 134–144)
T3 Uptake Ratio: 26 % (ref 24–39)
T4, Total: 8.7 ug/dL (ref 4.5–12.0)
TSH: 0.526 u[IU]/mL (ref 0.450–4.500)
Total Protein: 6.6 g/dL (ref 6.0–8.5)
Triglycerides: 136 mg/dL (ref 0–149)
Uric Acid: 3 mg/dL (ref 3.0–7.2)
VLDL Cholesterol Cal: 25 mg/dL (ref 5–40)
WBC: 6.8 10*3/uL (ref 3.4–10.8)

## 2020-01-11 LAB — HGB A1C W/O EAG: Hgb A1c MFr Bld: 5.4 % (ref 4.8–5.6)

## 2020-01-11 LAB — VITAMIN D 25 HYDROXY (VIT D DEFICIENCY, FRACTURES): Vit D, 25-Hydroxy: 29.9 ng/mL — ABNORMAL LOW (ref 30.0–100.0)

## 2020-01-11 NOTE — Progress Notes (Signed)
Results reviewed with pt in clinic. of sun on skin for borderline Vitamin D. Continue 2000unit Vit D supplement daily. Diet and exercise recommendations discussed re: elevated cholesterol, recommendations per NP notes above. Handout provided. Routine f/u with pcp. Copy of labs provided to pt. Results routed to pcp per pt request. No further questions/concerns.

## 2020-01-13 ENCOUNTER — Other Ambulatory Visit: Payer: Self-pay | Admitting: Registered Nurse

## 2020-02-20 ENCOUNTER — Other Ambulatory Visit: Payer: Self-pay | Admitting: Registered Nurse

## 2020-05-09 ENCOUNTER — Encounter: Payer: Self-pay | Admitting: Registered Nurse

## 2020-05-09 ENCOUNTER — Ambulatory Visit: Payer: Self-pay | Admitting: Registered Nurse

## 2020-05-09 ENCOUNTER — Other Ambulatory Visit: Payer: Self-pay

## 2020-05-09 VITALS — BP 98/68 | HR 66 | Temp 97.9°F

## 2020-05-09 DIAGNOSIS — J302 Other seasonal allergic rhinitis: Secondary | ICD-10-CM

## 2020-05-09 DIAGNOSIS — H6983 Other specified disorders of Eustachian tube, bilateral: Secondary | ICD-10-CM

## 2020-05-09 NOTE — Patient Instructions (Signed)
Eustachian Tube Dysfunction ° °Eustachian tube dysfunction refers to a condition in which a blockage develops in the narrow passage that connects the middle ear to the back of the nose (eustachian tube). The eustachian tube regulates air pressure in the middle ear by letting air move between the ear and nose. It also helps to drain fluid from the middle ear space. °Eustachian tube dysfunction can affect one or both ears. When the eustachian tube does not function properly, air pressure, fluid, or both can build up in the middle ear. °What are the causes? °This condition occurs when the eustachian tube becomes blocked or cannot open normally. Common causes of this condition include: °· Ear infections. °· Colds and other infections that affect the nose, mouth, and throat (upper respiratory tract). °· Allergies. °· Irritation from cigarette smoke. °· Irritation from stomach acid coming up into the esophagus (gastroesophageal reflux). The esophagus is the tube that carries food from the mouth to the stomach. °· Sudden changes in air pressure, such as from descending in an airplane or scuba diving. °· Abnormal growths in the nose or throat, such as: °? Growths that line the nose (nasal polyps). °? Abnormal growth of cells (tumors). °? Enlarged tissue at the back of the throat (adenoids). °What increases the risk? °You are more likely to develop this condition if: °· You smoke. °· You are overweight. °· You are a child who has: °? Certain birth defects of the mouth, such as cleft palate. °? Large tonsils or adenoids. °What are the signs or symptoms? °Common symptoms of this condition include: °· A feeling of fullness in the ear. °· Ear pain. °· Clicking or popping noises in the ear. °· Ringing in the ear. °· Hearing loss. °· Loss of balance. °· Dizziness. °Symptoms may get worse when the air pressure around you changes, such as when you travel to an area of high elevation, fly on an airplane, or go scuba diving. °How is  this diagnosed? °This condition may be diagnosed based on: °· Your symptoms. °· A physical exam of your ears, nose, and throat. °· Tests, such as those that measure: °? The movement of your eardrum (tympanogram). °? Your hearing (audiometry). °How is this treated? °Treatment depends on the cause and severity of your condition. °· In mild cases, you may relieve your symptoms by moving air into your ears. This is called "popping the ears." °· In more severe cases, or if you have symptoms of fluid in your ears, treatment may include: °? Medicines to relieve congestion (decongestants). °? Medicines that treat allergies (antihistamines). °? Nasal sprays or ear drops that contain medicines that reduce swelling (steroids). °? A procedure to drain the fluid in your eardrum (myringotomy). In this procedure, a small tube is placed in the eardrum to: °§ Drain the fluid. °§ Restore the air in the middle ear space. °? A procedure to insert a balloon device through the nose to inflate the opening of the eustachian tube (balloon dilation). °Follow these instructions at home: °Lifestyle °· Do not do any of the following until your health care provider approves: °? Travel to high altitudes. °? Fly in airplanes. °? Work in a pressurized cabin or room. °? Scuba dive. °· Do not use any products that contain nicotine or tobacco, such as cigarettes and e-cigarettes. If you need help quitting, ask your health care provider. °· Keep your ears dry. Wear fitted earplugs during showering and bathing. Dry your ears completely after. °General instructions °· Take over-the-counter   and prescription medicines only as told by your health care provider.  Use techniques to help pop your ears as recommended by your health care provider. These may include: ? Chewing gum. ? Yawning. ? Frequent, forceful swallowing. ? Closing your mouth, holding your nose closed, and gently blowing as if you are trying to blow air out of your nose.  Keep all  follow-up visits as told by your health care provider. This is important. Contact a health care provider if:  Your symptoms do not go away after treatment.  Your symptoms come back after treatment.  You are unable to pop your ears.  You have: ? A fever. ? Pain in your ear. ? Pain in your head or neck. ? Fluid draining from your ear.  Your hearing suddenly changes.  You become very dizzy.  You lose your balance. Summary  Eustachian tube dysfunction refers to a condition in which a blockage develops in the eustachian tube.  It can be caused by ear infections, allergies, inhaled irritants, or abnormal growths in the nose or throat.  Symptoms include ear pain, hearing loss, or ringing in the ears.  Mild cases are treated with maneuvers to unblock the ears, such as yawning or ear popping.  Severe cases are treated with medicines. Surgery may also be done (rare). This information is not intended to replace advice given to you by your health care provider. Make sure you discuss any questions you have with your health care provider. Document Revised: 12/02/2017 Document Reviewed: 12/02/2017 Elsevier Patient Education  2020 [employer]. Allergic Rhinitis, Adult Allergic rhinitis is an allergic reaction that affects the mucous membrane inside the nose. It causes sneezing, a runny or stuffy nose, and the feeling of mucus going down the back of the throat (postnasal drip). Allergic rhinitis can be mild to severe. There are two types of allergic rhinitis:  Seasonal. This type is also called hay fever. It happens only during certain seasons.  Perennial. This type can happen at any time of the year. What are the causes? This condition happens when the body's defense system (immune system) responds to certain harmless substances called allergens as though they were germs.  Seasonal allergic rhinitis is triggered by pollen, which can come from grasses, trees, and weeds. Perennial allergic  rhinitis may be caused by:  House dust mites.  Pet dander.  Mold spores. What are the signs or symptoms? Symptoms of this condition include:  Sneezing.  Runny or stuffy nose (nasal congestion).  Postnasal drip.  Itchy nose.  Tearing of the eyes.  Trouble sleeping.  Daytime sleepiness. How is this diagnosed? This condition may be diagnosed based on:  Your medical history.  A physical exam.  Tests to check for related conditions, such as: ? Asthma. ? Pink eye. ? Ear infection. ? Upper respiratory infection.  Tests to find out which allergens trigger your symptoms. These may include skin or blood tests. How is this treated? There is no cure for this condition, but treatment can help control symptoms. Treatment may include:  Taking medicines that block allergy symptoms, such as antihistamines. Medicine may be given as a shot, nasal spray, or pill.  Avoiding the allergen.  Desensitization. This treatment involves getting ongoing shots until your body becomes less sensitive to the allergen. This treatment may be done if other treatments do not help.  If taking medicine and avoiding the allergen does not work, new, stronger medicines may be prescribed. Follow these instructions at home:  Find out what  you are allergic to. Common allergens include smoke, dust, and pollen.  Avoid the things you are allergic to. These are some things you can do to help avoid allergens: ? Replace carpet with wood, tile, or vinyl flooring. Carpet can trap dander and dust. ? Do not smoke. Do not allow smoking in your home. ? Change your heating and air conditioning filter at least once a month. ? During allergy season:  Keep windows closed as much as possible.  Plan outdoor activities when pollen counts are lowest. This is usually during the evening hours.  When coming indoors, change clothing and shower before sitting on furniture or bedding.  Take over-the-counter and prescription  medicines only as told by your health care provider.  Keep all follow-up visits as told by your health care provider. This is important. Contact a health care provider if:  You have a fever.  You develop a persistent cough.  You make whistling sounds when you breathe (you wheeze).  Your symptoms interfere with your normal daily activities. Get help right away if:  You have shortness of breath. Summary  This condition can be managed by taking medicines as directed and avoiding allergens.  Contact your health care provider if you develop a persistent cough or fever.  During allergy season, keep windows closed as much as possible. This information is not intended to replace advice given to you by your health care provider. Make sure you discuss any questions you have with your health care provider. Document Revised: 07/25/2017 Document Reviewed: 09/19/2016 Elsevier Patient Education  2020 [employer]. How to Perform a Sinus Rinse A sinus rinse is a home treatment that is used to rinse your sinuses with a sterile mixture of salt and water (saline solution). Sinuses are air-filled spaces in your skull behind the bones of your face and forehead that open into your nasal cavity. A sinus rinse can help to clear mucus, dirt, dust, or pollen from your nasal cavity. You may do a sinus rinse when you have a cold, a virus, nasal allergy symptoms, a sinus infection, or stuffiness in your nose or sinuses. Talk with your health care provider about whether a sinus rinse might help you. What are the risks? A sinus rinse is generally safe and effective. However, there are a few risks, which include:  A burning sensation in your sinuses. This may happen if you do not make the saline solution as directed. Be sure to follow all directions when making the saline solution.  Nasal irritation.  Infection from contaminated water. This is rare, but possible. Do not do a sinus rinse if you have had ear or  nasal surgery, ear infection, or blocked ears. Supplies needed:  Saline solution or powder.  Distilled or sterile water may be needed to mix with saline powder. ? You may use boiled and cooled tap water. Boil tap water for 5 minutes; cool until it is lukewarm. Use within 24 hours. ? Do not use regular tap water to mix with the saline solution.  Neti pot or nasal rinse bottle. These supplies release the saline solution into your nose and through your sinuses. Neti pots and nasal rinse bottles can be purchased at [employer], a health food store, or online. How to perform a sinus rinse  1. Wash your hands with soap and water. 2. Wash your device according to the directions that came with the product and then dry it. 3. Use the solution that comes with your product or one  that is sold separately in stores. Follow the mixing directions on the package if you need to mix with sterile or distilled water. 4. Fill the device with the amount of saline solution noted in the device instructions. 5. Stand over a sink and tilt your head sideways over the sink. 6. Place the spout of the device in your upper nostril (the one closer to the ceiling). 7. Gently pour or squeeze the saline solution into your nasal cavity. The liquid should drain out from the lower nostril if you are not too congested. 8. While rinsing, breathe through your open mouth. 9. Gently blow your nose to clear any mucus and rinse solution. Blowing too hard may cause ear pain. 10. Repeat in your other nostril. 11. Clean and rinse your device with clean water and then air-dry it. Talk with your health care provider or pharmacist if you have questions about how to do a sinus rinse. Summary  A sinus rinse is a home treatment that is used to rinse your sinuses with a sterile mixture of salt and water (saline solution).  A sinus rinse is generally safe and effective. Follow all instructions carefully.  Before doing a sinus rinse,  talk with your health care provider about whether it would be helpful for you. This information is not intended to replace advice given to you by your health care provider. Make sure you discuss any questions you have with your health care provider. Document Revised: 06/09/2017 Document Reviewed: 06/09/2017 Elsevier Patient Education  2020 [employer].

## 2020-05-09 NOTE — Progress Notes (Signed)
Subjective:    Patient ID: Rebecca Sweeney, female    DOB: 01/25/1961, 59 y.o.   MRN: [id number]  59y/o Caucasian established female pt c/o bilateral ears burning inside that started this morning. No drainage or auditory changes. Denies body aches, fever, chills, n/v/d, sinus pain/pressure, sore throat, cough, or any other sx, allergy flare or exposure to known covid person.     Review of Systems  Constitutional: Negative for activity change, appetite change, chills, diaphoresis, fatigue and fever.  HENT: Positive for ear pain. Negative for congestion, ear discharge, facial swelling, hearing loss, postnasal drip, rhinorrhea, sinus pressure, sinus pain, sore throat, trouble swallowing and voice change.   Eyes: Negative for discharge.  Respiratory: Negative for cough, shortness of breath, wheezing and stridor.   Cardiovascular: Negative for chest pain.  Gastrointestinal: Negative for diarrhea, nausea and vomiting.  Endocrine: Negative for cold intolerance and heat intolerance.  Genitourinary: Negative for difficulty urinating.  Musculoskeletal: Negative for gait problem, myalgias, neck pain and neck stiffness.  Skin: Negative for rash.  Allergic/Immunologic: Positive for environmental allergies. Negative for food allergies.  Neurological: Negative for dizziness, tremors, seizures, syncope, facial asymmetry, speech difficulty, weakness, light-headedness, numbness and headaches.  Hematological: Negative for adenopathy. Does not bruise/bleed easily.  Psychiatric/Behavioral: Negative for agitation, confusion and sleep disturbance.       Objective:   Physical Exam Vitals and nursing note reviewed.  Constitutional:      General: She is awake. She is not in acute distress.    Appearance: Normal appearance. She is well-developed, well-groomed and normal weight. She is not ill-appearing, toxic-appearing or diaphoretic.  HENT:     Head: Normocephalic and atraumatic.     Jaw: There is normal jaw  occlusion. No trismus.     Salivary Glands: Right salivary gland is not diffusely enlarged or tender. Left salivary gland is not diffusely enlarged or tender.     Right Ear: Hearing, ear canal and external ear normal. A middle ear effusion is present. There is no impacted cerumen.     Left Ear: Hearing, ear canal and external ear normal. A middle ear effusion is present. There is no impacted cerumen.     Nose: Mucosal edema present. No nasal deformity, septal deviation, laceration, congestion or rhinorrhea.     Right Turbinates: Not enlarged, swollen or pale.     Left Turbinates: Not enlarged, swollen or pale.     Right Sinus: No maxillary sinus tenderness or frontal sinus tenderness.     Left Sinus: No maxillary sinus tenderness or frontal sinus tenderness.     Mouth/Throat:     Lips: Pink. No lesions.     Mouth: Mucous membranes are moist. Mucous membranes are not pale, not dry and not cyanotic. No injury, lacerations, oral lesions or angioedema.     Dentition: No gum lesions.     Tongue: No lesions. Tongue does not deviate from midline.     Palate: No mass and lesions.     Pharynx: Uvula midline. Pharyngeal swelling and posterior oropharyngeal erythema present. No oropharyngeal exudate or uvula swelling.     Tonsils: No tonsillar exudate or tonsillar abscesses.     Comments: Cobblestoning posterior pharynx; bilateral TMs air fluid level clear; bilateral allergic shiners; clear discharge bilateral nasal turbinates Eyes:     General: Lids are normal. Vision grossly intact. Gaze aligned appropriately. Allergic shiner present. No visual field deficit or scleral icterus.       Right eye: No foreign body, discharge or hordeolum.  Left eye: No foreign body, discharge or hordeolum.     Extraocular Movements: Extraocular movements intact.     Right eye: Normal extraocular motion and no nystagmus.     Left eye: Normal extraocular motion and no nystagmus.     Conjunctiva/sclera: Conjunctivae  normal.     Right eye: Right conjunctiva is not injected. No chemosis, exudate or hemorrhage.    Left eye: Left conjunctiva is not injected. No chemosis, exudate or hemorrhage.    Pupils: Pupils are equal, round, and reactive to light. Pupils are equal.     Right eye: Pupil is round and reactive.     Left eye: Pupil is round and reactive.  Neck:     Thyroid: No thyroid mass or thyromegaly.     Trachea: Trachea and phonation normal. No tracheal tenderness or tracheal deviation.  Cardiovascular:     Rate and Rhythm: Normal rate and regular rhythm.     Pulses:          Radial pulses are 2+ on the right side and 2+ on the left side.  Pulmonary:     Effort: Pulmonary effort is normal. No accessory muscle usage or respiratory distress.     Breath sounds: Normal breath sounds and air entry. No stridor, decreased air movement or transmitted upper airway sounds. No decreased breath sounds, wheezing, rhonchi or rales.     Comments: Wearing cloth mask due to covid 19 pandemic; spoke full sentences without difficulty; no cough observed in exam room Chest:     Chest wall: No tenderness.  Abdominal:     General: Abdomen is flat. There is no distension.  Musculoskeletal:        General: No swelling, tenderness, deformity or signs of injury. Normal range of motion.     Right shoulder: Normal.     Left shoulder: Normal.     Right elbow: Normal.     Left elbow: Normal.     Right hand: Normal.     Left hand: Normal.     Cervical back: Normal, normal range of motion and neck supple. No swelling, edema, deformity, erythema, signs of trauma, lacerations, rigidity or crepitus. No spinous process tenderness or muscular tenderness. Normal range of motion.     Thoracic back: Normal.     Lumbar back: Normal.     Right hip: Normal.     Left hip: Normal.     Right knee: Normal.     Left knee: Normal.  Lymphadenopathy:     Head:     Right side of head: No submental, submandibular, tonsillar, preauricular,  posterior auricular or occipital adenopathy.     Left side of head: No submental, submandibular, tonsillar, preauricular, posterior auricular or occipital adenopathy.     Cervical: No cervical adenopathy.     Right cervical: No superficial, deep or posterior cervical adenopathy.    Left cervical: No superficial, deep or posterior cervical adenopathy.  Skin:    General: Skin is warm and dry.     Capillary Refill: Capillary refill takes less than 2 seconds.     Coloration: Skin is not ashen, cyanotic, jaundiced, mottled, pale or sallow.     Findings: No abrasion, abscess, acne, bruising, burn, ecchymosis, erythema, signs of injury, laceration, lesion, petechiae, rash or wound.     Nails: There is no clubbing.  Neurological:     General: No focal deficit present.     Mental Status: She is alert and oriented to person, place, and time. Mental status  is at baseline. She is not disoriented.     GCS: GCS eye subscore is 4. GCS verbal subscore is 5. GCS motor subscore is 6.     Cranial Nerves: Cranial nerves are intact. No cranial nerve deficit, dysarthria or facial asymmetry.     Sensory: Sensation is intact. No sensory deficit.     Motor: Motor function is intact. No weakness, tremor, atrophy, abnormal muscle tone or seizure activity.     Coordination: Coordination is intact. Coordination normal.     Gait: Gait is intact. Gait normal.     Comments: In/out of chair without difficulty; bilateral hand grasp equal 5/5; gait sure and steady in clinic  Psychiatric:        Attention and Perception: Attention and perception normal.        Mood and Affect: Mood and affect normal.        Speech: Speech normal.        Behavior: Behavior normal. Behavior is cooperative.        Thought Content: Thought content normal.        Cognition and Memory: Cognition and memory normal.        Judgment: Judgment normal.           Assessment & Plan:  A-eustachian tube dysfunction and seasonal allergic  rhinitis  P- No evidence of invasive bacterial infection, non toxic and well hydrated.  I do not see where any further testing or imaging is necessary at this time.   I will suggest supportive care, rest, good hygiene and encourage the patient to take adequate fluids.  The patient is to return to clinic or EMERGENCY ROOM if symptoms worsen or change significantly e.g. ear pain, fever, purulent discharge from ears or bleeding.  Exitcare handout  eustachian tube dysfunction.  Discussed with patient post nasal drip irritates throat/causes swelling blocks eustachian tubes from draining and fluid fills up middle ear.  Bacteria/viruses can grow in fluid and with moving head tube compressed and increases pressure in tube/ear worsening pain.  Studies show will take 30 days for fluid to resolve after post nasal drip controlled with nasal steroid/antihistamine. Antibiotics and steroids do not speed up fluid removal.  Patient verbalized agreement and understanding of treatment plan and had no further questions at this time.  Restart normal saline nasal spray 2 sprays each nostril q2h wa as needed. flonase 1 spray each nostril BID.  Patient denied personal or family history of ENT cancer.  OTC antihistamine of choice claritin 10mg  po daily. And if no relief with these restart singulair 10mg  po qhs also.  Avoid triggers if possible.  Shower prior to bedtime if exposed to triggers.  If allergic dust/dust mites recommend mattress/pillow covers/encasements; washing linens, vacuuming, sweeping, dusting weekly.  Call or return to clinic as needed if these symptoms worsen or fail to improve as anticipated.   Exitcare handout on allergic rhinitis and sinus rinse given to patient.  Patient verbalized understanding of instructions, agreed with plan of care and had no further questions at this time.  P2:  Avoidance and hand washing.

## 2020-05-16 ENCOUNTER — Ambulatory Visit: Payer: Self-pay | Admitting: Registered Nurse

## 2020-05-16 ENCOUNTER — Other Ambulatory Visit: Payer: Self-pay

## 2020-05-16 ENCOUNTER — Encounter: Payer: Self-pay | Admitting: Registered Nurse

## 2020-05-16 VITALS — BP 103/72 | HR 73 | Temp 98.1°F

## 2020-05-16 DIAGNOSIS — J0101 Acute recurrent maxillary sinusitis: Secondary | ICD-10-CM

## 2020-05-16 MED ORDER — PREDNISONE 10 MG (21) PO TBPK: ORAL_TABLET | ORAL | 0 refills | Status: DC

## 2020-05-16 MED ORDER — AMOXICILLIN 875 MG PO TABS: 875.0000 mg | ORAL_TABLET | Freq: Two times a day (BID) | ORAL | 0 refills | Status: AC

## 2020-05-16 MED ORDER — IBUPROFEN 800 MG PO TABS: 800.0000 mg | ORAL_TABLET | Freq: Three times a day (TID) | ORAL | 0 refills | Status: AC | PRN

## 2020-05-16 NOTE — Patient Instructions (Signed)
How to Perform a Sinus Rinse A sinus rinse is a home treatment that is used to rinse your sinuses with a sterile mixture of salt and water (saline solution). Sinuses are air-filled spaces in your skull behind the bones of your face and forehead that open into your nasal cavity. A sinus rinse can help to clear mucus, dirt, dust, or pollen from your nasal cavity. You may do a sinus rinse when you have a cold, a virus, nasal allergy symptoms, a sinus infection, or stuffiness in your nose or sinuses. Talk with your health care provider about whether a sinus rinse might help you. What are the risks? A sinus rinse is generally safe and effective. However, there are a few risks, which include:  A burning sensation in your sinuses. This may happen if you do not make the saline solution as directed. Be sure to follow all directions when making the saline solution.  Nasal irritation.  Infection from contaminated water. This is rare, but possible. Do not do a sinus rinse if you have had ear or nasal surgery, ear infection, or blocked ears. Supplies needed:  Saline solution or powder.  Distilled or sterile water may be needed to mix with saline powder. ? You may use boiled and cooled tap water. Boil tap water for 5 minutes; cool until it is lukewarm. Use within 24 hours. ? Do not use regular tap water to mix with the saline solution.  Neti pot or nasal rinse bottle. These supplies release the saline solution into your nose and through your sinuses. Neti pots and nasal rinse bottles can be purchased at your local pharmacy, a health food store, or online. How to perform a sinus rinse  1. Wash your hands with soap and water. 2. Wash your device according to the directions that came with the product and then dry it. 3. Use the solution that comes with your product or one that is sold separately in stores. Follow the mixing directions on the package if you need to mix with sterile or distilled  water. 4. Fill the device with the amount of saline solution noted in the device instructions. 5. Stand over a sink and tilt your head sideways over the sink. 6. Place the spout of the device in your upper nostril (the one closer to the ceiling). 7. Gently pour or squeeze the saline solution into your nasal cavity. The liquid should drain out from the lower nostril if you are not too congested. 8. While rinsing, breathe through your open mouth. 9. Gently blow your nose to clear any mucus and rinse solution. Blowing too hard may cause ear pain. 10. Repeat in your other nostril. 11. Clean and rinse your device with clean water and then air-dry it. Talk with your health care provider or pharmacist if you have questions about how to do a sinus rinse. Summary  A sinus rinse is a home treatment that is used to rinse your sinuses with a sterile mixture of salt and water (saline solution).  A sinus rinse is generally safe and effective. Follow all instructions carefully.  Before doing a sinus rinse, talk with your health care provider about whether it would be helpful for you. This information is not intended to replace advice given to you by your health care provider. Make sure you discuss any questions you have with your health care provider. Document Revised: 06/09/2017 Document Reviewed: 06/09/2017 Elsevier Patient Education  2020 [employer] Sinusitis, Adult Sinusitis is inflammation of your sinuses. Sinuses   are hollow spaces in the bones around your face. Your sinuses are located:  Around your eyes.  In the middle of your forehead.  Behind your nose.  In your cheekbones. Mucus normally drains out of your sinuses. When your nasal tissues become inflamed or swollen, mucus can become trapped or blocked. This allows bacteria, viruses, and fungi to grow, which leads to infection. Most infections of the sinuses are caused by a virus. Sinusitis can develop quickly. It can last for up to 4  weeks (acute) or for more than 12 weeks (chronic). Sinusitis often develops after a cold. What are the causes? This condition is caused by anything that creates swelling in the sinuses or stops mucus from draining. This includes:  Allergies.  Asthma.  Infection from bacteria or viruses.  Deformities or blockages in your nose or sinuses.  Abnormal growths in the nose (nasal polyps).  Pollutants, such as chemicals or irritants in the air.  Infection from fungi (rare). What increases the risk? You are more likely to develop this condition if you:  Have a weak body defense system (immune system).  Do a lot of swimming or diving.  Overuse nasal sprays.  Smoke. What are the signs or symptoms? The main symptoms of this condition are pain and a feeling of pressure around the affected sinuses. Other symptoms include:  Stuffy nose or congestion.  Thick drainage from your nose.  Swelling and warmth over the affected sinuses.  Headache.  Upper toothache.  A cough that may get worse at night.  Extra mucus that collects in the throat or the back of the nose (postnasal drip).  Decreased sense of smell and taste.  Fatigue.  A fever.  Sore throat.  Bad breath. How is this diagnosed? This condition is diagnosed based on:  Your symptoms.  Your medical history.  A physical exam.  Tests to find out if your condition is acute or chronic. This may include: ? Checking your nose for nasal polyps. ? Viewing your sinuses using a device that has a light (endoscope). ? Testing for allergies or bacteria. ? Imaging tests, such as an MRI or CT scan. In rare cases, a bone biopsy may be done to rule out more serious types of fungal sinus disease. How is this treated? Treatment for sinusitis depends on the cause and whether your condition is chronic or acute.  If caused by a virus, your symptoms should go away on their own within 10 days. You may be given medicines to relieve  symptoms. They include: ? Medicines that shrink swollen nasal passages (topical intranasal decongestants). ? Medicines that treat allergies (antihistamines). ? A spray that eases inflammation of the nostrils (topical intranasal corticosteroids). ? Rinses that help get rid of thick mucus in your nose (nasal saline washes).  If caused by bacteria, your health care provider may recommend waiting to see if your symptoms improve. Most bacterial infections will get better without antibiotic medicine. You may be given antibiotics if you have: ? A severe infection. ? A weak immune system.  If caused by narrow nasal passages or nasal polyps, you may need to have surgery. Follow these instructions at home: Medicines  Take, use, or apply over-the-counter and prescription medicines only as told by your health care provider. These may include nasal sprays.  If you were prescribed an antibiotic medicine, take it as told by your health care provider. Do not stop taking the antibiotic even if you start to feel better. Hydrate and humidify     Drink enough fluid to keep your urine pale yellow. Staying hydrated will help to thin your mucus.  Use a cool mist humidifier to keep the humidity level in your home above 50%.  Inhale steam for 10-15 minutes, 3-4 times a day, or as told by your health care provider. You can do this in the bathroom while a hot shower is running.  Limit your exposure to cool or dry air. Rest  Rest as much as possible.  Sleep with your head raised (elevated).  Make sure you get enough sleep each night. General instructions   Apply a warm, moist washcloth to your face 3-4 times a day or as told by your health care provider. This will help with discomfort.  Wash your hands often with soap and water to reduce your exposure to germs. If soap and water are not available, use hand sanitizer.  Do not smoke. Avoid being around people who are smoking (secondhand smoke).  Keep all  follow-up visits as told by your health care provider. This is important. Contact a health care provider if:  You have a fever.  Your symptoms get worse.  Your symptoms do not improve within 10 days. Get help right away if:  You have a severe headache.  You have persistent vomiting.  You have severe pain or swelling around your face or eyes.  You have vision problems.  You develop confusion.  Your neck is stiff.  You have trouble breathing. Summary  Sinusitis is soreness and inflammation of your sinuses. Sinuses are hollow spaces in the bones around your face.  This condition is caused by nasal tissues that become inflamed or swollen. The swelling traps or blocks the flow of mucus. This allows bacteria, viruses, and fungi to grow, which leads to infection.  If you were prescribed an antibiotic medicine, take it as told by your health care provider. Do not stop taking the antibiotic even if you start to feel better.  Keep all follow-up visits as told by your health care provider. This is important. This information is not intended to replace advice given to you by your health care provider. Make sure you discuss any questions you have with your health care provider. Document Revised: 01/12/2018 Document Reviewed: 01/12/2018 Elsevier Patient Education  2020 [employer]  

## 2020-05-16 NOTE — Progress Notes (Signed)
Subjective:    Patient ID: Rebecca Sweeney, female    DOB: 09-11-1960, 59 y.o.   MRN: [id number]  59y/o Caucasian established female pt c/o sinus pain pressure, maxillary and frontal x2 days. Started 9/19. Pressure behind both eyes, upper dental pain, nasal congestion, bad taste in mouth, R ear pain. Denies ear drainage, +pain and muffled sounds. Denies sore throat, chest congestion or cough. Denies fever/chills/body aches/n/v/d. Using Sudafed, Flonase, Claritin, montelukast, and saline spray at home and not getting any relief from sinus pressure or post nasal drip.  Would like refill on motrin today also.  This is the worst pain she has had from sinuses in a  Long time per patient.  Mucous clear yellow per patient     Review of Systems  Constitutional: Negative for activity change, appetite change, chills, diaphoresis, fatigue and fever.  HENT: Positive for congestion, ear pain, hearing loss, postnasal drip, rhinorrhea, sinus pressure, sinus pain and sneezing. Negative for dental problem, drooling, ear discharge, facial swelling, mouth sores, nosebleeds, sore throat, tinnitus, trouble swallowing and voice change.   Eyes: Negative for discharge and itching.  Respiratory: Negative for cough, chest tightness, shortness of breath, wheezing and stridor.   Cardiovascular: Negative for chest pain.  Gastrointestinal: Negative for diarrhea, nausea and vomiting.  Endocrine: Negative for cold intolerance and heat intolerance.  Genitourinary: Negative for difficulty urinating.  Musculoskeletal: Negative for gait problem and myalgias.  Skin: Negative for rash.  Allergic/Immunologic: Positive for environmental allergies. Negative for food allergies.  Neurological: Positive for headaches. Negative for dizziness, tremors, seizures, syncope, facial asymmetry, speech difficulty, weakness, light-headedness and numbness.  Hematological: Negative for adenopathy. Does not bruise/bleed easily.   Psychiatric/Behavioral: Negative for agitation, confusion and sleep disturbance.       Objective:   Physical Exam Vitals and nursing note reviewed.  Constitutional:      General: She is awake. She is not in acute distress.    Appearance: Normal appearance. She is well-developed, well-groomed and normal weight. She is ill-appearing. She is not toxic-appearing or diaphoretic.  HENT:     Head: Normocephalic and atraumatic.     Jaw: There is normal jaw occlusion. No trismus.     Salivary Glands: Right salivary gland is not diffusely enlarged or tender. Left salivary gland is not diffusely enlarged or tender.     Right Ear: Hearing, ear canal and external ear normal. A middle ear effusion is present. There is no impacted cerumen.     Left Ear: Hearing, ear canal and external ear normal. A middle ear effusion is present. There is no impacted cerumen.     Nose: Mucosal edema, congestion and rhinorrhea present. No nasal deformity, septal deviation or laceration. Rhinorrhea is clear.     Right Turbinates: Enlarged and swollen. Not pale.     Left Turbinates: Enlarged and swollen. Not pale.     Right Sinus: Maxillary sinus tenderness and frontal sinus tenderness present.     Left Sinus: Maxillary sinus tenderness and frontal sinus tenderness present.     Mouth/Throat:     Lips: Pink. No lesions.     Mouth: Mucous membranes are moist. Mucous membranes are not pale, not dry and not cyanotic. No injury, lacerations, oral lesions or angioedema.     Dentition: Abnormal dentition. Has dentures. No dental caries, dental abscesses or gum lesions.     Tongue: No lesions. Tongue does not deviate from midline.     Palate: No mass and lesions.     Pharynx: Uvula midline.  Pharyngeal swelling and posterior oropharyngeal erythema present. No oropharyngeal exudate or uvula swelling.     Tonsils: No tonsillar exudate or tonsillar abscesses. 0 on the right. 0 on the left.     Comments: Cobblestoning posterior  pharynx; bilateral allergic shiners; maxillary greater than frontal bilaterally TTP sinuses; clear discharge bilateral nares; air fluid level clear bilateral TMs intact Eyes:     General: Lids are normal. Vision grossly intact. Gaze aligned appropriately. Allergic shiner present. No visual field deficit or scleral icterus.       Right eye: No foreign body, discharge or hordeolum.        Left eye: No foreign body, discharge or hordeolum.     Extraocular Movements: Extraocular movements intact.     Right eye: Normal extraocular motion and no nystagmus.     Left eye: Normal extraocular motion and no nystagmus.     Conjunctiva/sclera: Conjunctivae normal.     Right eye: Right conjunctiva is not injected. No chemosis, exudate or hemorrhage.    Left eye: Left conjunctiva is not injected. No chemosis, exudate or hemorrhage.    Pupils: Pupils are equal, round, and reactive to light. Pupils are equal.     Right eye: Pupil is round and reactive.     Left eye: Pupil is round and reactive.  Neck:     Thyroid: No thyroid mass, thyromegaly or thyroid tenderness.     Trachea: Trachea and phonation normal. No tracheal tenderness or tracheal deviation.  Cardiovascular:     Rate and Rhythm: Normal rate and regular rhythm.     Chest Wall: PMI is not displaced.     Pulses: Normal pulses.          Radial pulses are 2+ on the right side and 2+ on the left side.     Heart sounds: Normal heart sounds, S1 normal and S2 normal. Heart sounds not distant. No murmur heard.  No systolic murmur is present.  No diastolic murmur is present.  No friction rub. No gallop.   Pulmonary:     Effort: Pulmonary effort is normal. No accessory muscle usage or respiratory distress.     Breath sounds: Normal breath sounds and air entry. No stridor, decreased air movement or transmitted upper airway sounds. No decreased breath sounds, wheezing, rhonchi or rales.     Comments: Spoke full sentences without difficulty; no cough in exam  room; wearing cloth mask due to covid pandemic Chest:     Chest wall: No tenderness.  Abdominal:     General: Abdomen is flat. There is no distension.     Palpations: Abdomen is soft.  Musculoskeletal:        General: No tenderness. Normal range of motion.     Right shoulder: Normal.     Left shoulder: Normal.     Right elbow: Normal.     Left elbow: Normal.     Right hand: Normal.     Left hand: Normal.     Cervical back: Normal, normal range of motion and neck supple. No swelling, edema, deformity, erythema, signs of trauma, lacerations, rigidity, torticollis or crepitus. No pain with movement, spinous process tenderness or muscular tenderness. Normal range of motion.     Thoracic back: Normal. No swelling, edema, deformity, signs of trauma or lacerations.     Lumbar back: Normal. No swelling, edema, deformity, signs of trauma or lacerations.     Right hip: Normal.     Left hip: Normal.     Right knee:  Normal.     Left knee: Normal.  Lymphadenopathy:     Head:     Right side of head: No submental, submandibular, tonsillar, preauricular, posterior auricular or occipital adenopathy.     Left side of head: No submental, submandibular, tonsillar, preauricular, posterior auricular or occipital adenopathy.     Cervical: No cervical adenopathy.     Right cervical: No superficial, deep or posterior cervical adenopathy.    Left cervical: No superficial, deep or posterior cervical adenopathy.  Skin:    General: Skin is warm and dry.     Capillary Refill: Capillary refill takes less than 2 seconds.     Coloration: Skin is not ashen, cyanotic, jaundiced, mottled, pale or sallow.     Findings: No abrasion, abscess, acne, bruising, burn, ecchymosis, erythema, signs of injury, laceration, lesion, petechiae, rash or wound.     Nails: There is no clubbing.  Neurological:     General: No focal deficit present.     Mental Status: She is alert and oriented to person, place, and time. Mental status  is at baseline. She is not disoriented.     GCS: GCS eye subscore is 4. GCS verbal subscore is 5. GCS motor subscore is 6.     Cranial Nerves: Cranial nerves are intact. No cranial nerve deficit, dysarthria or facial asymmetry.     Sensory: Sensation is intact. No sensory deficit.     Motor: Motor function is intact. No weakness, tremor, atrophy, abnormal muscle tone or seizure activity.     Coordination: Coordination is intact. Coordination normal.     Gait: Gait is intact. Gait normal.     Comments: On/off exam table and in/out of chair without difficulty; gait sure and steady in clinic; bilateral hand grasp equal 5/5  Psychiatric:        Attention and Perception: Attention and perception normal.        Mood and Affect: Mood and affect normal.        Speech: Speech normal.        Behavior: Behavior normal. Behavior is cooperative.        Thought Content: Thought content normal.        Cognition and Memory: Cognition and memory normal.        Judgment: Judgment normal.           Assessment & Plan:  A-recurrent acute maxillary sinusitis  P-Continue flonase 1 spray each nostril BID, saline 2 sprays each nostril q2h wa prn congestion. Start amoxicillin 875mg  po BID x 10 days #20 RF0 dispensed from [facility] to patient  [facility] with [facility]in but she preferred to start amoxicillin and prednisone taper with motrin now.  Prednisone 10mg  po taper with breakfast (60mg  day 1, 50 day 2, 40 day 3, 30 day 4, 20 day 5 and 10 day 6) #21 RF0 dispensed from [facility] to patient.  Ibuprofen 800mg  po TID prn pain take with food #30 RF0 dispensed from [facility] to patient.  If no improvement with 48 hours plan of care to notify clinic staff and consider covid testing/quarantine.   Denied personal or family history of ENT cancer.  Shower BID especially prior to bed. No evidence of systemic bacterial infection, non toxic and well hydrated.  I do  not see where any further testing or imaging is necessary at this time.   I will suggest supportive care, rest, good hygiene and encourage the  patient to take adequate fluids.  The patient is to return to clinic or EMERGENCY ROOM if symptoms worsen or change significantly.  Exitcare handout on sinusitis and sinus rinse.  Patient verbalized agreement and understanding of treatment plan and had no further questions at this time.   P2:  Hand washing and cover cough

## 2020-05-25 ENCOUNTER — Other Ambulatory Visit: Payer: Self-pay

## 2020-05-25 ENCOUNTER — Ambulatory Visit: Payer: Self-pay | Admitting: Registered Nurse

## 2020-05-25 VITALS — BP 110/70 | HR 70 | Temp 97.6°F

## 2020-05-25 DIAGNOSIS — S90862A Insect bite (nonvenomous), left foot, initial encounter: Secondary | ICD-10-CM

## 2020-05-25 MED ORDER — SULFAMETHOXAZOLE-TRIMETHOPRIM 800-160 MG PO TABS: 1.0000 | ORAL_TABLET | Freq: Two times a day (BID) | ORAL | 0 refills | Status: AC

## 2020-05-25 MED ORDER — DIPHENHYDRAMINE HCL 2 % EX GEL: 1.0000 "application " | Freq: Two times a day (BID) | CUTANEOUS | Status: AC | PRN

## 2020-05-25 MED ORDER — HYDROCORTISONE 1 % EX LOTN: 1.0000 "application " | TOPICAL_LOTION | Freq: Two times a day (BID) | CUTANEOUS | 0 refills | Status: AC

## 2020-05-25 MED ORDER — PREDNISONE 10 MG PO TABS: ORAL_TABLET | ORAL | 0 refills | Status: AC

## 2020-05-25 MED ORDER — MONTELUKAST SODIUM 10 MG PO TABS: ORAL_TABLET | ORAL | 3 refills | Status: DC

## 2020-05-25 MED ORDER — ACETAMINOPHEN 500 MG PO TABS: 1000.0000 mg | ORAL_TABLET | Freq: Four times a day (QID) | ORAL | 0 refills | Status: AC | PRN

## 2020-05-25 NOTE — Progress Notes (Signed)
Subjective:    Patient ID: Rebecca Sweeney, female    DOB: [birth year], 59 y.o.   MRN: [id number]  59y/o Caucasian established female pt reporting ?insect bites to dorsal aspect of left foot Monday 9/27 prior to departing work and clinic closed.  Applied hydrocortisone and took tylenol then iced/elevated once home as swelled up, itchy/red spots. Noticed severe itching first on Monday, 3 days ago.   Didn't find any insects in her shoes or on her skin.  Area burning and itching severely per patient.  Tried elevating foot, calagel, hydrocortisone, ice, tylenol without any relief.  Denied purulent drainage, fever, chills, body aches, n/v/d, headache. Affected area has stayed about the same since incident occurred but she stated it swelled up a great deal on the first day and that improved/went back to normal by today.  Patient recently finished prednisone taper and amoxicillin course for sinusitis 05/16/2020  She reported RN Rolly Salter applied calagel earlier today and applied dressing and scheduled appt for her to see me later in the day.  She removed dressing prior to my arrival in exam room.  Patient reported sinus infection resolved.      Review of Systems  Constitutional: Negative for activity change, appetite change, chills, diaphoresis, fatigue and fever.  HENT: Negative for trouble swallowing and voice change.   Eyes: Negative for photophobia and visual disturbance.  Respiratory: Negative for cough, chest tightness, shortness of breath, wheezing and stridor.   Cardiovascular: Negative for chest pain.  Gastrointestinal: Negative for abdominal pain, diarrhea, nausea and vomiting.  Endocrine: Negative for cold intolerance and heat intolerance.  Genitourinary: Negative for difficulty urinating.  Musculoskeletal: Positive for myalgias. Negative for back pain, gait problem, joint swelling, neck pain and neck stiffness.  Skin: Positive for color change, rash and wound. Negative for pallor.   Allergic/Immunologic: Positive for environmental allergies. Negative for food allergies.  Neurological: Negative for dizziness, tremors, seizures, syncope, facial asymmetry, speech difficulty, weakness, light-headedness, numbness and headaches.  Hematological: Negative for adenopathy. Does not bruise/bleed easily.  Psychiatric/Behavioral: Negative for agitation, confusion and sleep disturbance.       Objective:   Physical Exam Vitals and nursing note reviewed.  Constitutional:      General: She is awake. She is not in acute distress.    Appearance: Normal appearance. She is well-developed, well-groomed and normal weight. She is not ill-appearing, toxic-appearing or diaphoretic.  HENT:     Head: Normocephalic and atraumatic.     Jaw: There is normal jaw occlusion.     Right Ear: Hearing and external ear normal.     Left Ear: Hearing and external ear normal.     Nose: Nose normal. No congestion or rhinorrhea.     Mouth/Throat:     Pharynx: Oropharynx is clear.  Eyes:     General: Lids are normal. Vision grossly intact. Gaze aligned appropriately. Allergic shiner present. No visual field deficit or scleral icterus.       Right eye: No discharge.        Left eye: No discharge.     Extraocular Movements: Extraocular movements intact.     Conjunctiva/sclera: Conjunctivae normal.     Pupils: Pupils are equal, round, and reactive to light.  Neck:     Trachea: Trachea and phonation normal.  Cardiovascular:     Rate and Rhythm: Normal rate and regular rhythm.     Pulses: Normal pulses.          Dorsalis pedis pulses are 2+ on the right side  and 2+ on the left side.  Pulmonary:     Effort: Pulmonary effort is normal. No respiratory distress.     Breath sounds: Normal breath sounds and air entry. No stridor or transmitted upper airway sounds. No wheezing, rhonchi or rales.     Comments: No cough observed in exam room; spoke full sentences without difficulty; wearing disposable mask due to  covid 19 pandemic Musculoskeletal:        General: Swelling and tenderness present. No deformity. Normal range of motion.     Right shoulder: Normal.     Left shoulder: Normal.     Right elbow: Normal.     Left elbow: Normal.     Right hand: Normal.     Left hand: Normal.     Cervical back: Normal, normal range of motion and neck supple. No swelling, edema, deformity, erythema, signs of trauma, lacerations, rigidity, torticollis or crepitus. No pain with movement. Normal range of motion.     Thoracic back: Normal. No swelling, edema, deformity, signs of trauma or lacerations.     Lumbar back: Normal. No swelling, edema, deformity, signs of trauma or lacerations.     Right hip: Normal. No deformity or lacerations. Normal range of motion.     Left hip: Normal. No deformity or lacerations. Normal range of motion.     Right knee: No swelling, deformity or effusion.     Left knee: No swelling, deformity or effusion.     Right lower leg: No swelling, deformity, lacerations or tenderness. No edema.     Left lower leg: No swelling, deformity, lacerations or tenderness. No edema.     Left foot: Normal range of motion and normal capillary refill. Swelling and tenderness present. No deformity, bony tenderness or crepitus. Normal pulse.       Feet:  Feet:     Left foot:     Skin integrity: Erythema and callus present. No ulcer, blister, skin breakdown, dry skin or fissure.  Lymphadenopathy:     Cervical: No cervical adenopathy.     Right cervical: No superficial cervical adenopathy.    Left cervical: No superficial cervical adenopathy.  Skin:    General: Skin is warm and dry.     Capillary Refill: Capillary refill takes less than 2 seconds.     Coloration: Skin is not ashen, cyanotic, jaundiced, mottled, pale or sallow.     Findings: Abrasion, erythema, rash and wound present. No abscess, acne, bruising, burn, ecchymosis, laceration, lesion or petechiae. Rash is macular and pustular. Rash is not  crusting, nodular, papular, purpuric, scaling, urticarial or vesicular.     Nails: There is no clubbing.          Comments: Patient reported has been scratching top of left foot no linear abrasions noted but skin disrupted over 1 lesion that has pustule at center  Neurological:     General: No focal deficit present.     Mental Status: She is alert and oriented to person, place, and time. Mental status is at baseline.     GCS: GCS eye subscore is 4. GCS verbal subscore is 5. GCS motor subscore is 6.     Cranial Nerves: Cranial nerves are intact. No cranial nerve deficit, dysarthria or facial asymmetry.     Sensory: Sensation is intact. No sensory deficit.     Motor: Motor function is intact. No weakness, tremor, atrophy, abnormal muscle tone or seizure activity.     Coordination: Coordination is intact. Coordination normal.  Gait: Gait is intact. Gait normal.     Comments: Gait sure and steady in hallway; wearing closed toed leather slip on shoes without socks; in/out of chair and on/off exam table without difficulty  Psychiatric:        Attention and Perception: Attention and perception normal.        Mood and Affect: Mood and affect normal.        Speech: Speech normal.        Behavior: Behavior normal. Behavior is cooperative.        Thought Content: Thought content normal.        Cognition and Memory: Cognition and memory normal.        Judgment: Judgment normal.           Assessment & Plan:  A-insect bite left foot initial visit  P-Did require oral steroids. Dispensed from [facility] prednisone 10mg  taper with breakfast (40x2days, 30x2d, 20x2d, 10mg x2d, 5mg x2d) #21 RF0  Take first dose now. 3 lesions.  Dispensed bactrim DS po BID x 7 days #40 RF0 from [facility] to patient start today take first dose now.  Applied calagel over erythema and topped with telfa gauze/surginette secured over foot size #7.  Given calagel 2% apply BID prn itching or hydrocortisone 1% topical lotion may apply BID  prn itching.  Wash affected area daily with soap and water.  Avoid scratching.  May apply ice 15 minutes po TID prn pain/swelling.  Exitcare handouts printed and given on spider bite/cellulitis. RTC if worsening erythema, pain, purulent discharge, fever after 48 hours antibiotics or same day if erythema spreading on ankle/going up leg (see [facility]/PCM/UC). Cover with bandage until healed over.  Given bandaids, calagel, hydrocortisone from clinic stock. Discussed with patient appears to be spider bite.  Tylenol 1000mg  po q6h prn pain preferred to use first and if no relief may take motrin 800mg  po q8h prn pain/swelling.  Discussed an ulcer and bruising may form around bite site.  Elevate foot when sitting.  Patient verbalized understanding, agreed with plan of care and had no further questions at this time.

## 2020-05-25 NOTE — Patient Instructions (Signed)
Cellulitis, Adult  Cellulitis is a skin infection. The infected area is usually warm, red, swollen, and tender. This condition occurs most often in the arms and lower legs. The infection can travel to the muscles, blood, and underlying tissue and become serious. It is very important to get treated for this condition. What are the causes? Cellulitis is caused by bacteria. The bacteria enter through a break in the skin, such as a cut, burn, insect bite, open sore, or crack. What increases the risk? This condition is more likely to occur in people who:  Have a weak body defense system (immune system).  Have open wounds on the skin, such as cuts, burns, bites, and scrapes. Bacteria can enter the body through these open wounds.  Are older than 58 years of age.  Have diabetes.  Have a type of long-lasting (chronic) liver disease (cirrhosis) or kidney disease.  Are obese.  Have a skin condition such as: ? Itchy rash (eczema). ? Slow movement of blood in the veins (venous stasis). ? Fluid buildup below the skin (edema).  Have had radiation therapy.  Use IV drugs. What are the signs or symptoms? Symptoms of this condition include:  Redness, streaking, or spotting on the skin.  Swollen area of the skin.  Tenderness or pain when an area of the skin is touched.  Warm skin.  A fever.  Chills.  Blisters. How is this diagnosed? This condition is diagnosed based on a medical history and physical exam. You may also have tests, including:  Blood tests.  Imaging tests. How is this treated? Treatment for this condition may include:  Medicines, such as antibiotic medicines or medicines to treat allergies (antihistamines).  Supportive care, such as rest and application of cold or warm cloths (compresses) to the skin.  Hospital care, if the condition is severe. The infection usually starts to get better within 1-2 days of treatment. Follow these instructions at  home:  Medicines  Take over-the-counter and prescription medicines only as told by your health care provider.  If you were prescribed an antibiotic medicine, take it as told by your health care provider. Do not stop taking the antibiotic even if you start to feel better. General instructions  Drink enough fluid to keep your urine pale yellow.  Do not touch or rub the infected area.  Raise (elevate) the infected area above the level of your heart while you are sitting or lying down.  Apply warm or cold compresses to the affected area as told by your health care provider.  Keep all follow-up visits as told by your health care provider. This is important. These visits let your health care provider make sure a more serious infection is not developing. Contact a health care provider if:  You have a fever.  Your symptoms do not begin to improve within 1-2 days of starting treatment.  Your bone or joint underneath the infected area becomes painful after the skin has healed.  Your infection returns in the same area or another area.  You notice a swollen bump in the infected area.  You develop new symptoms.  You have a general ill feeling (malaise) with muscle aches and pains. Get help right away if:  Your symptoms get worse.  You feel very sleepy.  You develop vomiting or diarrhea that persists.  You notice red streaks coming from the infected area.  Your red area gets larger or turns dark in color. These symptoms may represent a serious problem that is an   emergency. Do not wait to see if the symptoms will go away. Get medical help right away. Call your local emergency services (911 in the [place] Do not drive yourself to the hospital. Summary  Cellulitis is a skin infection. This condition occurs most often in the arms and lower legs.  Treatment for this condition may include medicines, such as antibiotic medicines or antihistamines.  Take over-the-counter and prescription  medicines only as told by your health care provider. If you were prescribed an antibiotic medicine, do not stop taking the antibiotic even if you start to feel better.  Contact a health care provider if your symptoms do not begin to improve within 1-2 days of starting treatment or your symptoms get worse.  Keep all follow-up visits as told by your health care provider. This is important. These visits let your health care provider make sure that a more serious infection is not developing. This information is not intended to replace advice given to you by your health care provider. Make sure you discuss any questions you have with your health care provider. Document Revised: 01/01/2018 Document Reviewed: 01/01/2018 Elsevier Patient Education  2020 [employer]. Spider Bite Spider bites are not common. When spider bites do happen, most do not cause serious health problems. There are only a few types of spider bites that can cause serious health problems. What are the causes? This condition is caused when a person accidentally makes contact with a spider in a way that traps the spider against the person's skin. What are the signs or symptoms? Symptoms may vary depending on the type of spider. Some spider bites may cause symptoms within 1 hour after the bite. For other spider bites, it may take 1-2 days for symptoms to develop. Common symptoms of this condition include:  A raised area that is red.  Redness and swelling around the area of the bite or bites.  Discomfort or pain in the area of the bite. A few types of spiders, such as the black widow spider or the brown recluse spider, can inject poison (venom) into a bite wound. This venom causes more serious symptoms. Symptoms of a venomous spider bite vary, and may include:  Muscle cramps.  Nausea, vomiting, or abdominal pain.  A fever.  A skin sore (lesion) that spreads. This can break into an open wound (skin ulcer).  Light-headedness or  dizziness. How is this diagnosed? This condition may be diagnosed based on your symptoms and a physical exam. Your health care provider will ask about the history of your injury and any details you may have about the spider. This may help to determine what type of spider bit you. How is this treated? Many spider bites do not require treatment. If needed, this condition may be treated by:  Icing and keeping the bite area raised (elevated).  Taking or applying over-the-counter or prescription medicines to help control symptoms such as pain and itching.  Having a tetanus shot, if needed.  Taking antibiotic medicine. Follow these instructions at home: Medicines  Take or apply over-the-counter and prescription medicines only as told by your health care provider.  If you were prescribed an antibiotic medicine, take it as told by your health care provider. Do not stop using the antibiotic even if you start to feel better. Managing pain and swelling   If directed, put ice on the bite area. ? Put ice in a plastic bag. ? Place a towel between your skin and the bag. ? Leave the  ice on for 20 minutes, 2-3 times a day.  Elevate the affected area above the level of your heart while you are sitting or lying down. General instructions   Do not scratch the bite area.  Keep the bite area clean and dry. Wash the bite area daily with soap and water as told by your health care provider.  Keep all follow-up visits as told by your health care provider. This is important. Contact a health care provider if:  Your bite does not get better after 3 days of treatment.  Your bite turns black or purple.  You have increased redness, swelling, or pain at the site of the bite. Get help right away if:  You develop shortness of breath or chest pain.  You have fluid, blood, or pus coming from the bite area.  You have muscle cramps or painful muscle spasms.  You develop abdominal pain, nausea, or  vomiting.  You feel unusually tired (fatigued) or sleepy. Summary  Spider bites are not common. When spider bites do happen, most do not cause serious health problems.  Take or apply over-the-counter and prescription medicines only as told by your health care provider.  Keep the bite area clean and dry. Wash the bite area daily with soap and water as told by your health care provider.  Contact a health care provider if you have increased redness, swelling, or pain at the site of the bite.  Get help right away if you have new or worsening symptoms or develop shortness of breath or chest pain. This information is not intended to replace advice given to you by your health care provider. Make sure you discuss any questions you have with your health care provider. Document Revised: 03/24/2018 Document Reviewed: 03/24/2018 Elsevier Patient Education  2020 [employer].

## 2020-05-27 ENCOUNTER — Telehealth: Payer: Self-pay | Admitting: Registered Nurse

## 2020-05-27 NOTE — Telephone Encounter (Signed)
Patient sent email rash improved and feeling much better no concerns at this time.

## 2020-05-27 NOTE — Telephone Encounter (Signed)
Telephone message left for patient following up to see if rash improved with prescribed medications.  May email me at [email or web address] and I will call again tomorrow.

## 2020-06-13 ENCOUNTER — Ambulatory Visit: Payer: Self-pay | Admitting: Registered Nurse

## 2020-06-13 ENCOUNTER — Encounter: Payer: Self-pay | Admitting: Registered Nurse

## 2020-06-13 ENCOUNTER — Other Ambulatory Visit: Payer: Self-pay

## 2020-06-13 VITALS — BP 102/70 | HR 82 | Temp 98.6°F

## 2020-06-13 DIAGNOSIS — R109 Unspecified abdominal pain: Secondary | ICD-10-CM

## 2020-06-13 LAB — POCT URINALYSIS DIPSTICK
Bilirubin, UA: NEGATIVE
Blood, UA: NEGATIVE
Glucose, UA: NEGATIVE mg/dL
Ketones, POC UA: NEGATIVE mg/dL
Leukocytes, UA: NEGATIVE
Nitrite, UA: NEGATIVE
Protein Ur, POC: NEGATIVE mg/dL
Specific Gravity, UA: 1.015 (ref 1.005–1.030)
Urobilinogen, UA: 0.2 E.U./dL
pH, UA: 7 (ref 5.0–8.0)

## 2020-06-13 MED ORDER — IBUPROFEN 800 MG PO TABS: 800.0000 mg | ORAL_TABLET | Freq: Three times a day (TID) | ORAL | 0 refills | Status: DC

## 2020-06-13 MED ORDER — LORATADINE 10 MG PO TABS
10.0000 mg | ORAL_TABLET | Freq: Every day | ORAL | 3 refills | Status: DC
Start: 2020-06-13 — End: 2021-06-19

## 2020-06-13 MED ORDER — BIOFREEZE 4 % EX GEL: 1.0000 "application " | Freq: Four times a day (QID) | CUTANEOUS | 0 refills | Status: AC | PRN

## 2020-06-13 MED ORDER — ACETAMINOPHEN 500 MG PO TABS
1000.0000 mg | ORAL_TABLET | Freq: Four times a day (QID) | ORAL | 0 refills | Status: AC | PRN
Start: 2020-06-13 — End: 2020-06-16

## 2020-06-13 NOTE — Patient Instructions (Signed)
Flank Pain, Adult Flank pain is pain that is located on the side of the body between the upper abdomen and the back. This area is called the flank. The pain may occur over a short period of time (acute), or it may be long-term or recurring (chronic). It may be mild or severe. Flank pain can be caused by many things, including:  Muscle soreness or injury.  Kidney stones or kidney disease.  Stress.  A disease of the spine (vertebral disk disease).  A lung infection (pneumonia).  Fluid around the lungs (pulmonary edema).  A skin rash caused by the chickenpox virus (shingles).  Tumors that affect the back of the abdomen.  Gallbladder disease. Follow these instructions at home:   Drink enough fluid to keep your urine clear or pale yellow.  Rest as told by your health care provider.  Take over-the-counter and prescription medicines only as told by your health care provider.  Keep a journal to track what has caused your flank pain and what has made it feel better.  Keep all follow-up visits as told by your health care provider. This is important. Contact a health care provider if:  Your pain is not controlled with medicine.  You have new symptoms.  Your pain gets worse.  You have a fever.  Your symptoms last longer than 2-3 days.  You have trouble urinating or you are urinating very frequently. Get help right away if:  You have trouble breathing or you are short of breath.  Your abdomen hurts or it is swollen or red.  You have nausea or vomiting.  You feel faint or you pass out.  You have blood in your urine. Summary  Flank pain is pain that is located on the side of the body between the upper abdomen and the back.  The pain may occur over a short period of time (acute), or it may be long-term or recurring (chronic). It may be mild or severe.  Flank pain can be caused by many things.  Contact your health care provider if your symptoms get worse or they last  longer than 2-3 days. This information is not intended to replace advice given to you by your health care provider. Make sure you discuss any questions you have with your health care provider. Document Revised: 07/25/2017 Document Reviewed: 10/25/2016 Elsevier Patient Education  2020 [employer]  

## 2020-06-13 NOTE — Progress Notes (Signed)
Subjective:    Patient ID: Rebecca Sweeney, female    DOB: 06/06/1961, 59 y.o.   MRN: [id number]  59y/o Caucasian established female pt c/o bilateral flank pain since yesterday. Progressively worsening. Better when lying down. Worse when walking. Feels restless when sitting or standing still. Denies suprapubic pain or dysuria. Denied headache, hand/feet swelling.  Has noticed urine a little more yellow but not bloody/brown.  Denied new foul odor/cloudiness, fever, chills, nausea, vomiting or diarrhea also.  Took pain medication 0900 and hasn't kicked in yet.  Denied personal or family history of kidney stones.  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  Works in [place] [employer] active job Psychologist, educational.     Review of Systems  Constitutional: Positive for fatigue. Negative for activity change, appetite change, chills, diaphoresis and fever.  HENT: Negative for trouble swallowing and voice change.   Eyes: Negative for photophobia and visual disturbance.  Respiratory: Negative for cough, shortness of breath, wheezing and stridor.   Cardiovascular: Negative for chest pain, palpitations and leg swelling.  Gastrointestinal: Negative for abdominal pain, blood in stool, diarrhea, nausea and vomiting.  Endocrine: Negative for cold intolerance and heat intolerance.  Genitourinary: Positive for flank pain. Negative for decreased urine volume, difficulty urinating, dyspareunia, dysuria, enuresis, frequency, genital sores, hematuria and menstrual problem.  Musculoskeletal: Positive for myalgias. Negative for gait problem, joint swelling, neck pain and neck stiffness.  Allergic/Immunologic: Positive for environmental allergies. Negative for food allergies.  Neurological: Negative for dizziness, tremors, seizures, syncope, facial asymmetry, speech difficulty,  weakness, light-headedness, numbness and headaches.  Hematological: Negative for adenopathy. Does not bruise/bleed easily.  Psychiatric/Behavioral: Negative for agitation, confusion and sleep disturbance.       Objective:   Physical Exam Vitals and nursing note reviewed.  Constitutional:      General: She is awake. She is not in acute distress.    Appearance: Normal appearance. She is well-developed, well-groomed and normal weight. She is not ill-appearing, toxic-appearing or diaphoretic.  HENT:     Head: Normocephalic and atraumatic.     Jaw: There is normal jaw occlusion.     Salivary Glands: Right salivary gland is not diffusely enlarged. Left salivary gland is not diffusely enlarged.     Right Ear: Hearing and external ear normal.     Left Ear: Hearing and external ear normal.     Nose: Nose normal. No congestion or rhinorrhea.     Mouth/Throat:     Mouth: No angioedema.     Pharynx: Oropharynx is clear.  Eyes:     General: Lids are normal. Vision grossly intact. Gaze aligned appropriately. Allergic shiner present. No visual field deficit or scleral icterus.       Right eye: No discharge.        Left eye: No discharge.     Extraocular Movements: Extraocular movements intact.     Conjunctiva/sclera: Conjunctivae normal.     Pupils: Pupils are equal, round, and reactive to light.  Neck:     Trachea: Trachea and phonation normal.  Cardiovascular:     Rate and Rhythm: Normal rate and regular rhythm.     Pulses: Normal pulses.          Radial pulses are 2+ on the right side and 2+ on the left side.     Heart sounds: Normal heart sounds.  Pulmonary:  Effort: Pulmonary effort is normal. No respiratory distress.     Breath sounds: Normal breath sounds and air entry. No stridor, decreased air movement or transmitted upper airway sounds. No wheezing, rhonchi or rales.     Comments: Wearing cloth mask due to covid 19 pandemic; spoke full sentences without difficulty; no cough  observed in exam room Chest:     Chest wall: No tenderness.  Abdominal:     General: Abdomen is flat. Bowel sounds are normal. There is no distension.     Palpations: Abdomen is soft. There is no shifting dullness, fluid wave, hepatomegaly, splenomegaly, mass or pulsatile mass.     Tenderness: There is no abdominal tenderness. There is right CVA tenderness and left CVA tenderness. There is no guarding or rebound. Negative signs include Murphy's sign.     Hernia: No hernia is present. There is no hernia in the umbilical area or ventral area.     Comments: Dull to percussion x 4 quads; normoactive bowel sounds x 4 quads; CVA patient reported bilateral pain with percussion but did not move/jump; quickly moved standing to sitting to supine and reverse on exam table without assist  Musculoskeletal:        General: No swelling, tenderness, deformity or signs of injury. Normal range of motion.     Right shoulder: No swelling, deformity, effusion, laceration or crepitus.     Left shoulder: No swelling, deformity, effusion, laceration or crepitus.     Right elbow: No swelling, deformity or lacerations.     Left elbow: No swelling, deformity or lacerations.     Right hand: No swelling, deformity or lacerations.     Left hand: No swelling, deformity or lacerations.       Arms:     Cervical back: Normal range of motion and neck supple. No swelling, edema, deformity, erythema, signs of trauma, lacerations, rigidity, torticollis, tenderness or crepitus. No pain with movement or muscular tenderness. Normal range of motion.     Thoracic back: No swelling, edema, deformity, signs of trauma, lacerations or spasms. Normal range of motion. No scoliosis.     Lumbar back: No swelling, edema, deformity, signs of trauma, lacerations or spasms. Normal range of motion. No scoliosis.     Right lower leg: No edema.     Left lower leg: No edema.     Comments: Right flank patient more often rubs when giving  history/discussing symptoms sitting on exam table; no muscle spasms noted on palpation  Lymphadenopathy:     Cervical: No cervical adenopathy.     Right cervical: No superficial cervical adenopathy.    Left cervical: No superficial cervical adenopathy.  Skin:    General: Skin is warm and dry.     Capillary Refill: Capillary refill takes less than 2 seconds.     Coloration: Skin is not jaundiced or pale.     Findings: No bruising, erythema, lesion or rash.  Neurological:     General: No focal deficit present.     Mental Status: She is alert and oriented to person, place, and time. Mental status is at baseline.     Cranial Nerves: No cranial nerve deficit.     Sensory: No sensory deficit.     Motor: No weakness.     Coordination: Coordination normal.     Gait: Gait normal.  Psychiatric:        Mood and Affect: Mood normal.        Behavior: Behavior normal. Behavior is cooperative.  Thought Content: Thought content normal.        Judgment: Judgment normal.   [employer] back belt applied, patient given another to use tomorrow.  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  Patient verbalized understanding information/instructions, agreed with plan of care and had no further questions at this time.  1100 spoke with patient in warehouse [employer] and pain medicine maybe helping a little.  She will check in with me prior to me departing for the day if flank pain not improving and have labs drawn nonfasting by RN Rolly Salter.  Discussed with patient I would call her tomorrow if labs drawn to discuss results.  If her symptoms worsening she is see PCM/UC provider for re-evaluation as [facility] closed Wednesdays/Saturdays and Sundays.  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  Patient verbalized understanding information/instructions/agreed with  plan of care and had no further questions at this time.  1330 patient reported RN Rolly Salter had drawn her labs when she saw me in warehouse.  I notified her I would call her tomorrow with blood test results and check in on her.  Patient verbalized understanding information and had no further questions at this time.      Assessment & Plan:  A-acute flank pain bilateral initial visit  P-VSS patient tolerating po intake without difficulty.  Urinating without difficulty.  Discussed urinalysis results with patient normal. Urine culture sent to [facility] via courier today.  Typically results in 72 hours will call patient once available.  She does not use mychart. Consider BMET/CBC if no relief with [employer] and NSAIDS patient prefers motrin 800mg  po TID prn pain/tylenol 1000mg  po QID prn pain/Biofreeze gel topical QID prn pain/stretching next 2 hours.  Discussed with patient symptoms could be kidney stone or muscle strain or arthritis back (cold front moved into area this weekend Saturday evening today is Tuesday)  Nighttime temps in 40s.  Patient is to push noncaffeinated fluids. Call or return to clinic as needed if these symptoms worsen or fail to improve as anticipated e.g. gross hematuria, fever, worsening pain, unable to void every 8 hours or tolerate po intake and having symptoms of dehydration e.g. dizzyness, unable to void then to PCM/UC/ER same day re-evaluation. [facility] handout on flank pain printed and given to patient. Patient verbalized agreement and understanding of treatment plan and had no further questions at this time. P2: Hydrate

## 2020-06-14 LAB — CBC WITH DIFFERENTIAL/PLATELET
Basophils Absolute: 0 10*3/uL (ref 0.0–0.2)
Basos: 0 %
EOS (ABSOLUTE): 0.1 10*3/uL (ref 0.0–0.4)
Eos: 1 %
Hematocrit: 35.1 % (ref 34.0–46.6)
Hemoglobin: 12.2 g/dL (ref 11.1–15.9)
Immature Grans (Abs): 0 10*3/uL (ref 0.0–0.1)
Immature Granulocytes: 0 %
Lymphocytes Absolute: 2.3 10*3/uL (ref 0.7–3.1)
Lymphs: 47 %
MCH: 33.6 pg — ABNORMAL HIGH (ref 26.6–33.0)
MCHC: 34.8 g/dL (ref 31.5–35.7)
MCV: 97 fL (ref 79–97)
Monocytes Absolute: 0.4 10*3/uL (ref 0.1–0.9)
Monocytes: 9 %
Neutrophils Absolute: 2.1 10*3/uL (ref 1.4–7.0)
Neutrophils: 43 %
Platelets: 279 10*3/uL (ref 150–450)
RBC: 3.63 x10E6/uL — ABNORMAL LOW (ref 3.77–5.28)
RDW: 12.5 % (ref 11.7–15.4)
WBC: 4.8 10*3/uL (ref 3.4–10.8)

## 2020-06-14 LAB — BASIC METABOLIC PANEL
BUN/Creatinine Ratio: 11 (ref 9–23)
BUN: 9 mg/dL (ref 6–24)
CO2: 24 mmol/L (ref 20–29)
Calcium: 8.7 mg/dL (ref 8.7–10.2)
Chloride: 105 mmol/L (ref 96–106)
Creatinine, Ser: 0.79 mg/dL (ref 0.57–1.00)
GFR calc Af Amer: 95 mL/min/{1.73_m2} (ref >59)
GFR calc non Af Amer: 83 mL/min/{1.73_m2} (ref >59)
Glucose: 106 mg/dL — ABNORMAL HIGH (ref 65–99)
Potassium: 3.7 mmol/L (ref 3.5–5.2)
Sodium: 141 mmol/L (ref 134–144)

## 2020-06-15 ENCOUNTER — Ambulatory Visit: Payer: Self-pay | Admitting: Registered Nurse

## 2020-06-15 ENCOUNTER — Encounter: Payer: Self-pay | Admitting: Registered Nurse

## 2020-06-15 ENCOUNTER — Other Ambulatory Visit: Payer: Self-pay

## 2020-06-15 VITALS — BP 104/72 | HR 78 | Temp 98.3°F

## 2020-06-15 DIAGNOSIS — H6983 Other specified disorders of Eustachian tube, bilateral: Secondary | ICD-10-CM

## 2020-06-15 DIAGNOSIS — Z5320 Procedure and treatment not carried out because of patient's decision for unspecified reasons: Secondary | ICD-10-CM

## 2020-06-15 DIAGNOSIS — J0101 Acute recurrent maxillary sinusitis: Secondary | ICD-10-CM

## 2020-06-15 DIAGNOSIS — Z716 Tobacco abuse counseling: Secondary | ICD-10-CM

## 2020-06-15 DIAGNOSIS — R5383 Other fatigue: Secondary | ICD-10-CM

## 2020-06-15 DIAGNOSIS — Z23 Encounter for immunization: Secondary | ICD-10-CM

## 2020-06-15 DIAGNOSIS — R7989 Other specified abnormal findings of blood chemistry: Secondary | ICD-10-CM

## 2020-06-15 DIAGNOSIS — F17210 Nicotine dependence, cigarettes, uncomplicated: Secondary | ICD-10-CM

## 2020-06-15 LAB — URINE CULTURE

## 2020-06-15 MED ORDER — AMOXICILLIN-POT CLAVULANATE 875-125 MG PO TABS
1.0000 | ORAL_TABLET | Freq: Two times a day (BID) | ORAL | 0 refills | Status: AC
Start: 2020-06-15 — End: 2020-06-25

## 2020-06-15 MED ORDER — PREDNISONE 10 MG (21) PO TBPK: ORAL_TABLET | ORAL | 0 refills | Status: DC

## 2020-06-15 MED ORDER — PHENYLEPHRINE HCL 5 MG PO TABS
5.0000 mg | ORAL_TABLET | Freq: Four times a day (QID) | ORAL | Status: AC | PRN
Start: 2020-06-15 — End: 2020-06-22

## 2020-06-15 NOTE — Patient Instructions (Addendum)
Coping with Quitting Smoking  Quitting smoking is a physical and mental challenge. You will face cravings, withdrawal symptoms, and temptation. Before quitting, work with your health care provider to make a plan that can help you cope. Preparation can help you quit and keep you from giving in. How can I cope with cravings? Cravings usually last for 5-10 minutes. If you get through it, the craving will pass. Consider taking the following actions to help you cope with cravings:  Keep your mouth busy: ? Chew sugar-free gum. ? Suck on hard candies or a straw. ? Brush your teeth.  Keep your hands and body busy: ? Immediately change to a different activity when you feel a craving. ? Squeeze or play with a ball. ? Do an activity or a hobby, like making bead jewelry, practicing needlepoint, or working with wood. ? Mix up your normal routine. ? Take a short exercise break. Go for a quick walk or run up and down stairs. ? Spend time in public places where smoking is not allowed.  Focus on doing something kind or helpful for someone else.  Call a friend or family member to talk during a craving.  Join a support group.  Call a quit line, such as 1-800-QUIT-NOW.  Talk with your health care provider about medicines that might help you cope with cravings and make quitting easier for you. How can I deal with withdrawal symptoms? Your body may experience negative effects as it tries to get used to not having nicotine in the system. These effects are called withdrawal symptoms. They may include:  Feeling hungrier than normal.  Trouble concentrating.  Irritability.  Trouble sleeping.  Feeling depressed.  Restlessness and agitation.  Craving a cigarette. To manage withdrawal symptoms:  Avoid places, people, and activities that trigger your cravings.  Remember why you want to quit.  Get plenty of sleep.  Avoid coffee and other caffeinated drinks. These may worsen some of your  symptoms. How can I handle social situations? Social situations can be difficult when you are quitting smoking, especially in the first few weeks. To manage this, you can:  Avoid parties, bars, and other social situations where people might be smoking.  Avoid alcohol.  Leave right away if you have the urge to smoke.  Explain to your family and friends that you are quitting smoking. Ask for understanding and support.  Plan activities with friends or family where smoking is not an option. What are some ways I can cope with stress? Wanting to smoke may cause stress, and stress can make you want to smoke. Find ways to manage your stress. Relaxation techniques can help. For example:  Breathe slowly and deeply, in through your nose and out through your mouth.  Listen to soothing, relaxing music.  Talk with a family member or friend about your stress.  Light a candle.  Soak in a bath or take a shower.  Think about a peaceful place. What are some ways I can prevent weight gain? Be aware that many people gain weight after they quit smoking. However, not everyone does. To keep from gaining weight, have a plan in place before you quit and stick to the plan after you quit. Your plan should include:  Having healthy snacks. When you have a craving, it may help to: ? Eat plain popcorn, crunchy carrots, celery, or other cut vegetables. ? Chew sugar-free gum.  Changing how you eat: ? Eat small portion sizes at meals. ? Eat 4-6 small meals  throughout the day instead of 1-2 large meals a day. ? Be mindful when you eat. Do not watch television or do other things that might distract you as you eat.  Exercising regularly: ? Make time to exercise each day. If you do not have time for a long workout, do short bouts of exercise for 5-10 minutes several times a day. ? Do some form of strengthening exercise, like weight lifting, and some form of aerobic exercise, like running or swimming.  Drinking  plenty of water or other low-calorie or no-calorie drinks. Drink 6-8 glasses of water daily, or as much as instructed by your health care provider. Summary  Quitting smoking is a physical and mental challenge. You will face cravings, withdrawal symptoms, and temptation to smoke again. Preparation can help you as you go through these challenges.  You can cope with cravings by keeping your mouth busy (such as by chewing gum), keeping your body and hands busy, and making calls to family, friends, or a helpline for people who want to quit smoking.  You can cope with withdrawal symptoms by avoiding places where people smoke, avoiding drinks with caffeine, and getting plenty of rest.  Ask your health care provider about the different ways to prevent weight gain, avoid stress, and handle social situations. This information is not intended to replace advice given to you by your health care provider. Make sure you discuss any questions you have with your health care provider. Document Revised: 07/25/2017 Document Reviewed: 08/09/2016 Elsevier Patient Education  [place]. Eustachian Tube Dysfunction  Eustachian tube dysfunction refers to a condition in which a blockage develops in the narrow passage that connects the middle ear to the back of the nose (eustachian tube). The eustachian tube regulates air pressure in the middle ear by letting air move between the ear and nose. It also helps to drain fluid from the middle ear space. Eustachian tube dysfunction can affect one or both ears. When the eustachian tube does not function properly, air pressure, fluid, or both can build up in the middle ear. What are the causes? This condition occurs when the eustachian tube becomes blocked or cannot open normally. Common causes of this condition include:  Ear infections.  Colds and other infections that affect the nose, mouth, and throat (upper respiratory tract).  Allergies.  Irritation from  cigarette smoke.  Irritation from stomach acid coming up into the esophagus (gastroesophageal reflux). The esophagus is the tube that carries food from the mouth to the stomach.  Sudden changes in air pressure, such as from descending in an airplane or scuba diving.  Abnormal growths in the nose or throat, such as: ? Growths that line the nose (nasal polyps). ? Abnormal growth of cells (tumors). ? Enlarged tissue at the back of the throat (adenoids). What increases the risk? You are more likely to develop this condition if:  You smoke.  You are overweight.  You are a child who has: ? Certain birth defects of the mouth, such as cleft palate. ? Large tonsils or adenoids. What are the signs or symptoms? Common symptoms of this condition include:  A feeling of fullness in the ear.  Ear pain.  Clicking or popping noises in the ear.  Ringing in the ear.  Hearing loss.  Loss of balance.  Dizziness. Symptoms may get worse when the air pressure around you changes, such as when you travel to an area of high elevation, fly on an airplane, or go scuba diving. How  is this diagnosed? This condition may be diagnosed based on:  Your symptoms.  A physical exam of your ears, nose, and throat.  Tests, such as those that measure: ? The movement of your eardrum (tympanogram). ? Your hearing (audiometry). How is this treated? Treatment depends on the cause and severity of your condition.  In mild cases, you may relieve your symptoms by moving air into your ears. This is called "popping the ears."  In more severe cases, or if you have symptoms of fluid in your ears, treatment may include: ? Medicines to relieve congestion (decongestants). ? Medicines that treat allergies (antihistamines). ? Nasal sprays or ear drops that contain medicines that reduce swelling (steroids). ? A procedure to drain the fluid in your eardrum (myringotomy). In this procedure, a small tube is placed in the  eardrum to:  Drain the fluid.  Restore the air in the middle ear space. ? A procedure to insert a balloon device through the nose to inflate the opening of the eustachian tube (balloon dilation). Follow these instructions at home: Lifestyle  Do not do any of the following until your health care provider approves: ? Travel to high altitudes. ? Fly in airplanes. ? Work in a [employer] scheme manager or room. ? Scuba dive.  Do not use any products that contain nicotine or tobacco, such as cigarettes and e-cigarettes. If you need help quitting, ask your health care provider.  Keep your ears dry. Wear fitted earplugs during showering and bathing. Dry your ears completely after. General instructions  Take over-the-counter and prescription medicines only as told by your health care provider.  Use techniques to help pop your ears as recommended by your health care provider. These may include: ? Chewing gum. ? Yawning. ? Frequent, forceful swallowing. ? Closing your mouth, holding your nose closed, and gently blowing as if you are trying to blow air out of your nose.  Keep all follow-up visits as told by your health care provider. This is important. Contact a health care provider if:  Your symptoms do not go away after treatment.  Your symptoms come back after treatment.  You are unable to pop your ears.  You have: ? A fever. ? Pain in your ear. ? Pain in your head or neck. ? Fluid draining from your ear.  Your hearing suddenly changes.  You become very dizzy.  You lose your balance. Summary  Eustachian tube dysfunction refers to a condition in which a blockage develops in the eustachian tube.  It can be caused by ear infections, allergies, inhaled irritants, or abnormal growths in the nose or throat.  Symptoms include ear pain, hearing loss, or ringing in the ears.  Mild cases are treated with maneuvers to unblock the ears, such as yawning or ear popping.  Severe cases are  treated with medicines. Surgery may also be done (rare). This information is not intended to replace advice given to you by your health care provider. Make sure you discuss any questions you have with your health care provider. Document Revised: 12/02/2017 Document Reviewed: 12/02/2017 Elsevier Patient Education  [place]. How to Perform a Sinus Rinse A sinus rinse is a home treatment that is used to rinse your sinuses with a sterile mixture of salt and water (saline solution). Sinuses are air-filled spaces in your skull behind the bones of your face and forehead that open into your nasal cavity. A sinus rinse can help to clear mucus, dirt, dust, or pollen from your nasal cavity. You  may do a sinus rinse when you have a cold, a virus, nasal allergy symptoms, a sinus infection, or stuffiness in your nose or sinuses. Talk with your health care provider about whether a sinus rinse might help you. What are the risks? A sinus rinse is generally safe and effective. However, there are a few risks, which include:  A burning sensation in your sinuses. This may happen if you do not make the saline solution as directed. Be sure to follow all directions when making the saline solution.  Nasal irritation.  Infection from contaminated water. This is rare, but possible. Do not do a sinus rinse if you have had ear or nasal surgery, ear infection, or blocked ears. Supplies needed:  Saline solution or powder.  Distilled or sterile water may be needed to mix with saline powder. ? You may use boiled and cooled tap water. Boil tap water for 5 minutes; cool until it is lukewarm. Use within 24 hours. ? Do not use regular tap water to mix with the saline solution.  Neti pot or nasal rinse bottle. These supplies release the saline solution into your nose and through your sinuses. Neti pots and nasal rinse bottles can be purchased at [employer], a health food store, or online. How to perform a  sinus rinse  1. Wash your hands with soap and water. 2. Wash your device according to the directions that came with the product and then dry it. 3. Use the solution that comes with your product or one that is sold separately in stores. Follow the mixing directions on the package if you need to mix with sterile or distilled water. 4. Fill the device with the amount of saline solution noted in the device instructions. 5. Stand over a sink and tilt your head sideways over the sink. 6. Place the spout of the device in your upper nostril (the one closer to the ceiling). 7. Gently pour or squeeze the saline solution into your nasal cavity. The liquid should drain out from the lower nostril if you are not too congested. 8. While rinsing, breathe through your open mouth. 9. Gently blow your nose to clear any mucus and rinse solution. Blowing too hard may cause ear pain. 10. Repeat in your other nostril. 11. Clean and rinse your device with clean water and then air-dry it. Talk with your health care provider or pharmacist if you have questions about how to do a sinus rinse. Summary  A sinus rinse is a home treatment that is used to rinse your sinuses with a sterile mixture of salt and water (saline solution).  A sinus rinse is generally safe and effective. Follow all instructions carefully.  Before doing a sinus rinse, talk with your health care provider about whether it would be helpful for you. This information is not intended to replace advice given to you by your health care provider. Make sure you discuss any questions you have with your health care provider. Document Revised: 06/09/2017 Document Reviewed: 06/09/2017 Elsevier Patient Education  [place]. Sinusitis, Adult Sinusitis is inflammation of your sinuses. Sinuses are hollow spaces in the bones around your face. Your sinuses are located:  Around your eyes.  In the middle of your forehead.  Behind your nose.  In your  cheekbones. Mucus normally drains out of your sinuses. When your nasal tissues become inflamed or swollen, mucus can become trapped or blocked. This allows bacteria, viruses, and fungi to grow, which leads to infection. Most infections  of the sinuses are caused by a virus. Sinusitis can develop quickly. It can last for up to 4 weeks (acute) or for more than 12 weeks (chronic). Sinusitis often develops after a cold. What are the causes? This condition is caused by anything that creates swelling in the sinuses or stops mucus from draining. This includes:  Allergies.  Asthma.  Infection from bacteria or viruses.  Deformities or blockages in your nose or sinuses.  Abnormal growths in the nose (nasal polyps).  Pollutants, such as chemicals or irritants in the air.  Infection from fungi (rare). What increases the risk? You are more likely to develop this condition if you:  Have a weak body defense system (immune system).  Do a lot of swimming or diving.  Overuse nasal sprays.  Smoke. What are the signs or symptoms? The main symptoms of this condition are pain and a feeling of pressure around the affected sinuses. Other symptoms include:  Stuffy nose or congestion.  Thick drainage from your nose.  Swelling and warmth over the affected sinuses.  Headache.  Upper toothache.  A cough that may get worse at night.  Extra mucus that collects in the throat or the back of the nose (postnasal drip).  Decreased sense of smell and taste.  Fatigue.  A fever.  Sore throat.  Bad breath. How is this diagnosed? This condition is diagnosed based on:  Your symptoms.  Your medical history.  A physical exam.  Tests to find out if your condition is acute or chronic. This may include: ? Checking your nose for nasal polyps. ? Viewing your sinuses using a device that has a light (endoscope). ? Testing for allergies or bacteria. ? Imaging tests, such as an MRI or CT scan. In rare  cases, a bone biopsy may be done to rule out more serious types of fungal sinus disease. How is this treated? Treatment for sinusitis depends on the cause and whether your condition is chronic or acute.  If caused by a virus, your symptoms should go away on their own within 10 days. You may be given medicines to relieve symptoms. They include: ? Medicines that shrink swollen nasal passages (topical intranasal decongestants). ? Medicines that treat allergies (antihistamines). ? A spray that eases inflammation of the nostrils (topical intranasal corticosteroids). ? Rinses that help get rid of thick mucus in your nose (nasal saline washes).  If caused by bacteria, your health care provider may recommend waiting to see if your symptoms improve. Most bacterial infections will get better without antibiotic medicine. You may be given antibiotics if you have: ? A severe infection. ? A weak immune system.  If caused by narrow nasal passages or nasal polyps, you may need to have surgery. Follow these instructions at home: Medicines  Take, use, or apply over-the-counter and prescription medicines only as told by your health care provider. These may include nasal sprays.  If you were prescribed an antibiotic medicine, take it as told by your health care provider. Do not stop taking the antibiotic even if you start to feel better. Hydrate and humidify   Drink enough fluid to keep your urine pale yellow. Staying hydrated will help to thin your mucus.  Use a cool mist humidifier to keep the humidity level in your home above 50%.  Inhale steam for 10-15 minutes, 3-4 times a day, or as told by your health care provider. You can do this in the bathroom while a hot shower is running.  Limit your exposure  to cool or dry air. Rest  Rest as much as possible.  Sleep with your head raised (elevated).  Make sure you get enough sleep each night. General instructions   Apply a warm, moist washcloth to  your face 3-4 times a day or as told by your health care provider. This will help with discomfort.  Wash your hands often with soap and water to reduce your exposure to germs. If soap and water are not available, use hand sanitizer.  Do not smoke. Avoid being around people who are smoking (secondhand smoke).  Keep all follow-up visits as told by your health care provider. This is important. Contact a health care provider if:  You have a fever.  Your symptoms get worse.  Your symptoms do not improve within 10 days. Get help right away if:  You have a severe headache.  You have persistent vomiting.  You have severe pain or swelling around your face or eyes.  You have vision problems.  You develop confusion.  Your neck is stiff.  You have trouble breathing. Summary  Sinusitis is soreness and inflammation of your sinuses. Sinuses are hollow spaces in the bones around your face.  This condition is caused by nasal tissues that become inflamed or swollen. The swelling traps or blocks the flow of mucus. This allows bacteria, viruses, and fungi to grow, which leads to infection.  If you were prescribed an antibiotic medicine, take it as told by your health care provider. Do not stop taking the antibiotic even if you start to feel better.  Keep all follow-up visits as told by your health care provider. This is important. This information is not intended to replace advice given to you by your health care provider. Make sure you discuss any questions you have with your health care provider. Document Revised: 01/12/2018 Document Reviewed: 01/12/2018 Elsevier Patient Education  [place]. Stool DNA Testing for Colon Cancer Why am I having this test? Colon cancer is the second-leading cause of cancer deaths in men and women. Testing for colon cancer before symptoms develop (screening) reduces your risk for this cancer. Stool DNA testing, also called the FIT-DNA test, is one  method of screening for colon cancer. Colon cancer grows slowly, so finding the cancer early means a better chance for effective treatment. All adults should have colon cancer screening starting at age 8 and continuing until age 30. Your health care provider may recommend screening at age 85. Screening may include having stool DNA testing every 3 years if:  You have no symptoms of colon cancer. Symptoms include rectal bleeding, weight loss, and changes in bowel habits.  You have an average risk of colon cancer. Average risk means: ? You do not have precancerous polyps. ? You do not have family or personal history of either colon cancer or a colon disease that increases your risk for colon cancer. What is being tested? For the test, a sample of your stool (feces) is checked for blood and changes in DNA that could lead to cancer. Growths in your colon that are cancerous (malignant) or may become cancerous (precancerous polyps) bleed and shed cells. Blood and cells can be picked up by stool as it passes through your colon. This test checks for blood cells as well as nine types of DNA (biomarkers) in three genes that have been linked to colon cancer and precancerous polyps. What kind of sample is taken?  This test uses a stool sample that is collected when you have  a bowel movement. How do I collect samples at home? You will be sent a stool sample collection kit in the mail. It will include instructions and everything you need to get the sample. Instructions may include these steps:  Store the kit in a dry place at room temperature until you are ready to collect the sample. Keep the kit away from heat and sunlight.  To collect the sample, place the collection device over the toilet.  Collect the sample according to the instructions that came with your kit.  After collecting a stool sample, follow instructions carefully regarding mixing the stool with a preservative and sealing the sample.  Send  the sample to the lab in the postage-paid box that was included in the kit. Your stool sample will be checked within 3 days. The results will be sent to your health care provider. How do I prepare for this test? There is no preparation required for this test. However, do not collect a stool sample if:  You have bleeding hemorrhoids.  You have any rectal bleeding.  You have a cut on your hand or finger.  You have your menstrual period.  You have diarrhea. How are the results reported? Your test results will be reported as either positive or negative. It is up to you to get your test results. Ask your health care provider, or the department that is doing the test, when your results will be ready. What do the results mean?  A positive result means that the test found abnormal DNA, blood cells, or both. If you have a positive result, you will need to have a follow-up exam of your colon done with a scope (colonoscopy).  A negative result means that no blood or changes in DNA were found. This does not guarantee that you do not have colon cancer. Your health care provider may recommend that you have other screening tests. Talk with your health care provider to discuss your results, treatment options, and if necessary, the need for more tests. Talk with your health care provider if you have any questions about your results. Questions to ask [your / your child's] health care provider Ask [your / your child's] health care provider, or the department that is doing the test: This information is not intended to replace advice given to you by your health care provider. Make sure you discuss any questions you have with your health care provider. Document Revised: 12/03/2018 Document Reviewed: 05/02/2016 Elsevier Patient Education  2020 [place] [facility]  Colorectal cancer screening is a group of tests that are used to check for colorectal cancer before symptoms develop.  Colorectal refers to the colon and rectum. The colon and rectum are located at the end of the digestive tract and carry bowel movements out of the body. Who should have screening? All adults starting at age 56 until age 3 should have screening. Your health care provider may recommend screening at age 37. You will have tests every 1-10 years, depending on your results and the type of screening test. You may have screening tests starting at an earlier age, or more frequently than other people, if you have any of the following risk factors:  A personal or family history of colorectal cancer or abnormal growths (polyps).  Inflammatory bowel disease, such as ulcerative colitis or Crohn's disease.  A history of having radiation treatment to the abdomen or pelvic area for cancer.  Colorectal cancer symptoms, such as changes in bowel habits or blood  in your stool.  A type of colon cancer syndrome that is passed from parent to child (hereditary), such as: ? Lynch syndrome. ? Familial adenomatous polyposis. ? Turcot syndrome. ? Peutz-Jeghers syndrome. Screening recommendations for adults who are 40-41 years old vary depending on health. How is screening done? There are several types of colorectal screening tests. You may have one or more of the following:  Guaiac-based fecal occult blood testing. For this test, a stool (feces) sample is checked for hidden (occult) blood, which could be a sign of colorectal cancer.  Fecal immunochemical test (FIT). For this test, a stool sample is checked for blood, which could be a sign of colorectal cancer.  Stool DNA test. For this test, a stool sample is checked for blood and changes in DNA that could lead to colorectal cancer.  Sigmoidoscopy. During this test, a thin, flexible tube with a camera on the end (sigmoidoscope) is used to examine the rectum and the lower colon.  Colonoscopy. During this test, a long, flexible tube with a camera on the end  (colonoscope) is used to examine the entire colon and rectum. With a colonoscopy, it is possible to take a sample of tissue (biopsy) and remove small polyps during the test.  Virtual colonoscopy. Instead of a colonoscope, this type of colonoscopy uses X-rays (CT scan) and computers to produce images of the colon and rectum. What are the benefits of screening? Screening reduces your risk for colorectal cancer and can help identify cancer at an early stage, when the cancer can be removed or treated more easily. It is common for polyps to form in the lining of the colon, especially as you age. These polyps may be cancerous or become cancerous over time. Screening can identify these polyps. What are the risks of screening? Each screening test may have different risks.  Stool sample tests have fewer risks than other types of screening tests. However, you may need more tests to confirm results from a stool sample test.  Screening tests that involve X-rays expose you to low levels of radiation, which may slightly increase your cancer risk. The benefit of detecting cancer outweighs the slight increase in risk.  Screening tests such as sigmoidoscopy and colonoscopy may place you at risk for bleeding, intestinal damage, infection, or a reaction to medicines given during the exam. Talk with your health care provider to understand your risk for colorectal cancer and to make a screening plan that is right for you. Questions to ask your health care provider  When should I start colorectal cancer screening?  What is my risk for colorectal cancer?  How often do I need screening?  Which screening tests do I need?  How do I get my test results?  What do my results mean? Where to find more information Learn more about colorectal cancer screening from:  [facility]: www.cancer.org  [facility] [employer]: www.cancer.gov Summary  Colorectal cancer screening is a group of tests  used to check for colorectal cancer before symptoms develop.  Screening reduces your risk for colorectal cancer and can help identify cancer at an early stage, when the cancer can be removed or treated more easily.  All adults starting at age 42 until age 32 should have screening. Your health care provider may recommend screening at age 7.  You may have screening tests starting at an earlier age, or more frequently than other people, if you have certain risk factors.  Talk with your health care provider to  understand your risk for colorectal cancer and to make a screening plan that is right for you. This information is not intended to replace advice given to you by your health care provider. Make sure you discuss any questions you have with your health care provider. Document Revised: 12/02/2018 Document Reviewed: 05/14/2017 Elsevier Patient Education  [place].  Anemia  Anemia is a condition in which you do not have enough red blood cells or hemoglobin. Hemoglobin is a substance in red blood cells that carries oxygen. When you do not have enough red blood cells or hemoglobin (are anemic), your body cannot get enough oxygen and your organs may not work properly. As a result, you may feel very tired or have other problems. What are the causes? Common causes of anemia include:  Excessive bleeding. Anemia can be caused by excessive bleeding inside or outside the body, including bleeding from the intestine or from periods in women.  Poor nutrition.  Long-lasting (chronic) kidney, thyroid, and liver disease.  Bone marrow disorders.  Cancer and treatments for cancer.  HIV (human immunodeficiency virus) and AIDS (acquired immunodeficiency syndrome).  Treatments for HIV and AIDS.  Spleen problems.  Blood disorders.  Infections, medicines, and autoimmune disorders that destroy red blood cells. What are the signs or symptoms? Symptoms of this condition include:  Minor  weakness.  Dizziness.  Headache.  Feeling heartbeats that are irregular or faster than normal (palpitations).  Shortness of breath, especially with exercise.  Paleness.  Cold sensitivity.  Indigestion.  Nausea.  Difficulty sleeping.  Difficulty concentrating. Symptoms may occur suddenly or develop slowly. If your anemia is mild, you may not have symptoms. How is this diagnosed? This condition is diagnosed based on:  Blood tests.  Your medical history.  A physical exam.  Bone marrow biopsy. Your health care provider may also check your stool (feces) for blood and may do additional testing to look for the cause of your bleeding. You may also have other tests, including:  Imaging tests, such as a CT scan or MRI.  Endoscopy.  Colonoscopy. How is this treated? Treatment for this condition depends on the cause. If you continue to lose a lot of blood, you may need to be treated at a hospital. Treatment may include:  Taking supplements of iron, vitamin W80, or folic acid.  Taking a hormone medicine (erythropoietin) that can help to stimulate red blood cell growth.  Having a blood transfusion. This may be needed if you lose a lot of blood.  Making changes to your diet.  Having surgery to remove your spleen. Follow these instructions at home:  Take over-the-counter and prescription medicines only as told by your health care provider.  Take supplements only as told by your health care provider.  Follow any diet instructions that you were given.  Keep all follow-up visits as told by your health care provider. This is important. Contact a health care provider if:  You develop new bleeding anywhere in the body. Get help right away if:  You are very weak.  You are short of breath.  You have pain in your abdomen or chest.  You are dizzy or feel faint.  You have trouble concentrating.  You have bloody or black, tarry stools.  You vomit repeatedly or you  vomit up blood. Summary  Anemia is a condition in which you do not have enough red blood cells or enough of a substance in your red blood cells that carries oxygen (hemoglobin).  Symptoms may occur  suddenly or develop slowly.  If your anemia is mild, you may not have symptoms.  This condition is diagnosed with blood tests as well as a medical history and physical exam. Other tests may be needed.  Treatment for this condition depends on the cause of the anemia. This information is not intended to replace advice given to you by your health care provider. Make sure you discuss any questions you have with your health care provider. Document Revised: 07/25/2017 Document Reviewed: 09/13/2016 Elsevier Patient Education  [place].  Fatigue If you have fatigue, you feel tired all the time and have a lack of energy or a lack of motivation. Fatigue may make it difficult to start or complete tasks because of exhaustion. In general, occasional or mild fatigue is often a normal response to activity or life. However, long-lasting (chronic) or extreme fatigue may be a symptom of a medical condition. Follow these instructions at home: General instructions Watch your fatigue for any changes. Go to bed and get up at the same time every day. Avoid fatigue by pacing yourself during the day and getting enough sleep at night. Maintain a healthy weight. Medicines Take over-the-counter and prescription medicines only as told by your health care provider. Take a multivitamin, if told by your health care provider. Do not use herbal or dietary supplements unless they are approved by your health care provider. Activity  Exercise regularly, as told by your health care provider. Use or practice techniques to help you relax, such as yoga, tai chi, meditation, or massage therapy. Eating and drinking  Avoid heavy meals in the evening. Eat a well-balanced diet, which includes lean proteins, whole grains,  plenty of fruits and vegetables, and low-fat dairy products. Avoid consuming too much caffeine. Avoid the use of alcohol. Drink enough fluid to keep your urine pale yellow. Lifestyle Change situations that cause you stress. Try to keep your work and personal schedule in balance. Do not use any products that contain nicotine or tobacco, such as cigarettes and e-cigarettes. If you need help quitting, ask your health care provider. Do not use drugs. Contact a health care provider if: Your fatigue does not get better. You have a fever. You suddenly lose or gain weight. You have headaches. You have trouble falling asleep or sleeping through the night. You feel angry, guilty, anxious, or sad. You are unable to have a bowel movement (constipation). Your skin is dry. You have swelling in your legs or another part of your body. Get help right away if: You feel confused. Your vision is blurry. You feel faint or you pass out. You have a severe headache. You have severe pain in your abdomen, your back, or the area between your waist and hips (pelvis). You have chest pain, shortness of breath, or an irregular or fast heartbeat. You are unable to urinate, or you urinate less than normal. You have abnormal bleeding, such as bleeding from the rectum, vagina, nose, lungs, or nipples. You vomit blood. You have thoughts about hurting yourself or others. If you ever feel like you may hurt yourself or others, or have thoughts about taking your own life, get help right away. You can go to your nearest emergency department or call: Your local emergency services (911 in the [place] A suicide crisis helpline, such as the [facility] at 8635068304. This is open 24 hours a day. Summary If you have fatigue, you feel tired all the time and have a lack of energy or  a lack of motivation. Fatigue may make it difficult to start or complete tasks because of exhaustion. Long-lasting  (chronic) or extreme fatigue may be a symptom of a medical condition. Exercise regularly, as told by your health care provider. Change situations that cause you stress. Try to keep your work and personal schedule in balance. This information is not intended to replace advice given to you by your health care provider. Make sure you discuss any questions you have with your health care provider. Document Revised: 03/03/2019 Document Reviewed: 05/07/2017 Elsevier Patient Education  2020 [employer].

## 2020-06-15 NOTE — Progress Notes (Signed)
Subjective:    Patient ID: Rebecca Sweeney, female    DOB: [birth year], 59 y.o.   MRN: [id number]  59y/o Caucasian established female pt c/o maxillary sinus pain/pressure x2-3 days. Bilateral ears popping. Cough, non-productive but feels congestion in her chest. No OTCs at home.  Last antibiotics Aug 2021 for similar symptoms.  Reports smoking 8-10 cigarettes per day.  Fall allergy flare.  Denied known covid contacts.  Has not had flu shot yet.  Other employees sick in building with similar symptoms in previous 4 weeks (most covid neg test but a few positive)  Patient planned to start vacation and drive to [place] tomorrow to visit family but now staying home to rest Fri-Sun.  Has all her allergy meds, taking them all and using more saline.  Work has been busy and she has felt worn down/fatigued.  Labs drawn earlier this week and results discussed with patient last night via telephone see result note in Epic.  Patient has question regarding what her last Vitamin D level was today also and if it could contribute to her fatigue.  She reported back pain has resolved but still taking tylenol due to sinuses and applying biofreeze topical to her back to help prevent pain during work.  Patient taking B and D vitamin supplements but not multivitamin.  Patient wearing mask at work and when out in public.  Has received 2 covid vaccine doses.     Review of Systems  Constitutional: Positive for fatigue. Negative for activity change, appetite change, chills, diaphoresis and fever.  HENT: Positive for congestion, ear pain, postnasal drip, rhinorrhea, sinus pressure and sinus pain. Negative for dental problem, drooling, ear discharge, facial swelling, hearing loss, mouth sores, nosebleeds, sneezing, sore throat, tinnitus, trouble swallowing and voice change.   Eyes: Negative for photophobia, pain, discharge, redness, itching and visual disturbance.  Respiratory: Positive for cough. Negative for shortness of breath, wheezing  and stridor.   Cardiovascular: Negative for chest pain and leg swelling.  Gastrointestinal: Negative for diarrhea, nausea and vomiting.  Endocrine: Negative for cold intolerance and heat intolerance.  Genitourinary: Negative for difficulty urinating and dysuria.  Musculoskeletal: Negative for back pain, gait problem, joint swelling, myalgias, neck pain and neck stiffness.  Skin: Negative for color change, pallor, rash and wound.  Allergic/Immunologic: Positive for environmental allergies and immunocompromised state. Negative for food allergies.  Neurological: Positive for headaches. Negative for dizziness, tremors, seizures, syncope, facial asymmetry, speech difficulty, weakness, light-headedness and numbness.  Psychiatric/Behavioral: Negative for agitation, confusion and sleep disturbance.       Objective:   Physical Exam Vitals and nursing note reviewed.  Constitutional:      General: She is awake. She is not in acute distress.    Appearance: Normal appearance. She is well-developed, well-groomed and normal weight. She is ill-appearing. She is not toxic-appearing or diaphoretic.  HENT:     Head: Normocephalic and atraumatic.     Jaw: There is normal jaw occlusion. No trismus.     Salivary Glands: Right salivary gland is not diffusely enlarged or tender. Left salivary gland is not diffusely enlarged or tender.     Comments: Cloudy fluid bilateral TMs air fluid level; cobblestoning posterior pharynx; bilateral allergic shiners; nasal turbinates edema erythema clear discharge; mildly tender bilateral maxillary sinuses    Right Ear: Hearing, ear canal and external ear normal. A middle ear effusion is present. There is no impacted cerumen.     Left Ear: Hearing, ear canal and external ear normal. A middle  ear effusion is present. There is no impacted cerumen.     Nose: Mucosal edema, congestion and rhinorrhea present. No nasal deformity, septal deviation, signs of injury, laceration or nasal  tenderness. Rhinorrhea is clear.     Right Turbinates: Enlarged and swollen. Not pale.     Left Turbinates: Enlarged and swollen. Not pale.     Right Sinus: Maxillary sinus tenderness present. No frontal sinus tenderness.     Left Sinus: Maxillary sinus tenderness present. No frontal sinus tenderness.     Mouth/Throat:     Lips: Pink. No lesions.     Mouth: Mucous membranes are moist. Mucous membranes are not pale, not dry and not cyanotic. No lacerations, oral lesions or angioedema.     Dentition: No dental tenderness, gingival swelling, dental caries, dental abscesses or gum lesions.     Tongue: No lesions. Tongue does not deviate from midline.     Palate: No mass and lesions.     Pharynx: Uvula midline. Pharyngeal swelling and posterior oropharyngeal erythema present. No oropharyngeal exudate or uvula swelling.     Tonsils: No tonsillar exudate or tonsillar abscesses. 0 on the right. 0 on the left.  Eyes:     General: Lids are normal. Vision grossly intact. Gaze aligned appropriately. Allergic shiner present. No visual field deficit or scleral icterus.       Right eye: No foreign body, discharge or hordeolum.        Left eye: No foreign body, discharge or hordeolum.     Extraocular Movements: Extraocular movements intact.     Right eye: Normal extraocular motion and no nystagmus.     Left eye: Normal extraocular motion and no nystagmus.     Conjunctiva/sclera: Conjunctivae normal.     Right eye: Right conjunctiva is not injected. No chemosis, exudate or hemorrhage.    Left eye: Left conjunctiva is not injected. No chemosis, exudate or hemorrhage.    Pupils: Pupils are equal, round, and reactive to light. Pupils are equal.     Right eye: Pupil is round and reactive.     Left eye: Pupil is round and reactive.  Neck:     Thyroid: No thyroid mass, thyromegaly or thyroid tenderness.     Trachea: Trachea and phonation normal. No tracheal tenderness, abnormal tracheal secretions or tracheal  deviation.  Cardiovascular:     Rate and Rhythm: Normal rate and regular rhythm.     Pulses: Normal pulses.          Radial pulses are 2+ on the right side and 2+ on the left side.  Pulmonary:     Effort: Pulmonary effort is normal. No accessory muscle usage or respiratory distress.     Breath sounds: Normal breath sounds and air entry. No stridor or transmitted upper airway sounds. No decreased breath sounds, wheezing or rales.     Comments: No cough; spoke full sentences without difficulty; wearing cloth mask due to covid 19 pandemic Chest:     Chest wall: No tenderness.  Abdominal:     General: Abdomen is flat. Bowel sounds are decreased. There is no distension.     Palpations: Abdomen is soft. There is no shifting dullness, fluid wave, hepatomegaly, splenomegaly, mass or pulsatile mass.     Tenderness: There is no abdominal tenderness. There is no right CVA tenderness, left CVA tenderness, guarding or rebound. Negative signs include Murphy's sign.     Hernia: No hernia is present. There is no hernia in the umbilical area.  Comments: Dull to percussion x 4 quads; hypoactive bowel sounds x 4 quads  Musculoskeletal:        General: No swelling, tenderness, deformity or signs of injury. Normal range of motion.     Right shoulder: Normal. No swelling, deformity, effusion or laceration.     Left shoulder: Normal. No swelling, deformity, effusion or laceration.     Right elbow: No swelling, deformity, effusion or lacerations.     Left elbow: No swelling, deformity, effusion or lacerations.     Right hand: Normal. No swelling, deformity or lacerations. Normal range of motion.     Left hand: Normal. No swelling, deformity or lacerations. Normal range of motion.     Cervical back: Normal, normal range of motion and neck supple. No swelling, edema, deformity, erythema, signs of trauma, lacerations, rigidity, torticollis, tenderness or crepitus. No pain with movement or muscular tenderness.  Normal range of motion.     Thoracic back: Normal. No swelling, edema, deformity, signs of trauma, lacerations or tenderness. Normal range of motion.     Lumbar back: Normal. No swelling, edema, deformity, signs of trauma or lacerations.     Right hip: Normal. No deformity, lacerations or crepitus. Normal range of motion.     Left hip: Normal. No deformity, lacerations or crepitus. Normal range of motion.     Right knee: Normal. No deformity, lacerations or crepitus.     Left knee: Normal. No deformity, lacerations or crepitus.     Right lower leg: No edema.     Left lower leg: No edema.     Comments: In/out of chair and on/off exam table standing to sitting to supine and reverse quickly without assistance  Lymphadenopathy:     Head:     Right side of head: No submental, submandibular, tonsillar, preauricular, posterior auricular or occipital adenopathy.     Left side of head: No submental, submandibular, tonsillar, preauricular, posterior auricular or occipital adenopathy.     Cervical: No cervical adenopathy.     Right cervical: No superficial, deep or posterior cervical adenopathy.    Left cervical: No superficial, deep or posterior cervical adenopathy.  Skin:    General: Skin is warm and dry.     Capillary Refill: Capillary refill takes less than 2 seconds.     Coloration: Skin is not ashen, cyanotic, jaundiced, mottled, pale or sallow.     Findings: No abrasion, abscess, acne, bruising, burn, ecchymosis, erythema, signs of injury, laceration, lesion, petechiae, rash or wound.     Nails: There is no clubbing.  Neurological:     General: No focal deficit present.     Mental Status: She is alert and oriented to person, place, and time. Mental status is at baseline. She is not disoriented.     GCS: GCS eye subscore is 4. GCS verbal subscore is 5. GCS motor subscore is 6.     Cranial Nerves: Cranial nerves are intact. No cranial nerve deficit, dysarthria or facial asymmetry.     Sensory:  Sensation is intact. No sensory deficit.     Motor: Motor function is intact. No weakness, tremor, atrophy, abnormal muscle tone or seizure activity.     Coordination: Coordination is intact. Coordination normal.     Gait: Gait is intact. Gait normal.     Comments: Bilateral hand grasp equal 5/5; gait sure and steady in clinic and warehouse  Psychiatric:        Attention and Perception: Attention and perception normal.  Mood and Affect: Mood and affect normal.        Speech: Speech normal.        Behavior: Behavior normal. Behavior is cooperative.        Thought Content: Thought content normal.        Cognition and Memory: Cognition and memory normal.        Judgment: Judgment normal.     Results for Dionicio StallJASNIC, Noami (MRN [id number]) as of 06/15/2020 11:09  Ref. Range 06/13/2020 13:30  Sodium Latest Ref Range: 134 - 144 mmol/L 141  Potassium Latest Ref Range: 3.5 - 5.2 mmol/L 3.7  Chloride Latest Ref Range: 96 - 106 mmol/L 105  CO2 Latest Ref Range: 20 - 29 mmol/L 24  Glucose Latest Ref Range: 65 - 99 mg/dL 409106 (H)  BUN Latest Ref Range: 6 - 24 mg/dL 9  Creatinine Latest Ref Range: 0.57 - 1.00 mg/dL 8.110.79  Calcium Latest Ref Range: 8.7 - 10.2 mg/dL 8.7  BUN/Creatinine Ratio Latest Ref Range: 9 - 23  11  GFR, Est Non African American Latest Ref Range: >59 mL/min/1.73 83  GFR, Est African American Latest Ref Range: >59 mL/min/1.73 95  Results for Dionicio StallJASNIC, Kerrie (MRN [id number]) as of 06/15/2020 11:09  Ref. Range 04/24/2012 13:25 07/25/2017 11:23 01/10/2020 14:17 06/13/2020 13:30  WBC Latest Ref Range: 3.4 - 10.8 x10E3/uL 11.4 (A) 6.8 6.8 4.8  RBC Latest Ref Range: 3.77 - 5.28 x10E6/uL 4.84 3.88 3.76 (L) 3.63 (L)  Hemoglobin Latest Ref Range: 11.1 - 15.9 g/dL 21.315.4 08.612.6 57.812.6 46.912.2  HCT Latest Ref Range: 34.0 - 46.6 % 50.4 (A) 38.1 37.4 35.1  MCV Latest Ref Range: 79 - 97 fL 104.1 (A) 98 (H) 100 (H) 97  MCH Latest Ref Range: 26.6 - 33.0 pg 31.8 (A) 32.5 33.5 (H) 33.6 (H)  MCHC Latest Ref  Range: 31 - 35 g/dL 62.930.6 (A) 52.833.1 41.333.7 24.434.8  RDW Latest Ref Range: 11.7 - 15.4 %  13.0 11.9 12.5  Platelets Latest Ref Range: 150 - 450 x10E3/uL  321 334 279  MPV Latest Ref Range: 0 - 99.8 fL 8.4     Neutrophils Latest Ref Range: Not Estab. %  50 44 43  Immature Granulocytes Latest Ref Range: Not Estab. %  0 0 0  NEUT# Latest Ref Range: 1.40 - 7.00 x10E3/uL  3.4 2.9 2.1  Lymphocyte # Latest Ref Range: 0 - 3 x10E3/uL  2.9 3.3 (H) 2.3  Monocytes Absolute Latest Ref Range: 0 - 0 x10E3/uL  0.4 0.3 0.4  Basophils Absolute Latest Ref Range: 0 - 0 x10E3/uL  0.0 0.0 0.0  Immature Grans (Abs) Latest Ref Range: 0.0 - 0.1 x10E3/uL  0.0 0.0 0.0  Lymphs Latest Ref Range: Not Estab. %  42 49 47  Monocytes Latest Ref Range: Not Estab. %  6 5 9   Basos Latest Ref Range: Not Estab. %  0 0 0  Granulocyte percent Latest Ref Range: 37 - 80 %G 72.5     Eos Latest Ref Range: Not Estab. %  2 2 1   EOS (ABSOLUTE) Latest Ref Range: 0.0 - 0.4 x10E3/uL  0.1 0.1 0.1  Results for Dionicio StallJASNIC, Jessikah (MRN [id number]) as of 06/15/2020 11:09  Ref. Range 01/10/2020 14:17  Iron Latest Ref Range: 27 - 159 ug/dL [id number]        Assessment & Plan:  A-acute recurrent maxillary sinusitis, eustachian tube dysfunction bilaterally, cigarette nicotine dependence, chronic fatigue unspecified, need for influenza vaccination and colon cancer screening (refused colonoscopy), low serum vitamin  d  P-Typically patient reports relief with augmentin and when bad prednisone 60mg  taper.  She says today is bad teeth hurting.  Prednisone 10mg  po daily with breakfast (60mg  day 1; 50 day 2; 40 day 3; 30 day 4; 20mg  day 5 and 10mg  day 6 #21 RF0 dispensed from [facility] to patient.  Start OTC phenylephrine 5-10mg  po q6h prn rhinitis/congestion give 4 UD from clinic stock.  Continue aggressive saline use 2 sprays each nostril q2h wa and in shower especially prior to bedtime.   Continue flonase 1 spray each nostril BID, saline 2 sprays each nostril q2h wa prn  congestion.  If no improvement with 48 hours of saline, prednisone taper and flonase use start augmentin 875mg  po BID x 10 days #20 RF0 dispensed from [facility] to patient  Denied personal or family history of ENT cancer.  Shower BID especially prior to bed. No evidence of systemic bacterial infection, non toxic and well hydrated.  I do not see where any further testing or imaging is necessary at this time.   I will suggest supportive care, rest, good hygiene and encourage the patient to take adequate fluids.  The patient is to return to clinic or EMERGENCY ROOM if symptoms worsen or change significantly.  Exitcare handouts on sinusitis and sinus rinse printed and given to patient.  Patient verbalized agreement and understanding of treatment plan and had no further questions at this time.   P2:  Hand washing and cover cough  If no improvement symptoms 48 hours medications will start quarantine to rule out covid.  Today is patient last day at work then now she cancelled her travel to visit family in this weekend prior to coming to clinic since sick  Added phenylephrine and prednisone to her seasonal allergy regimen today. Continue claritin 10mg  po daily and singulair 10mg  po qhs.    No evidence of invasive bacterial infection, non toxic and well hydrated.  I do not see where any further testing or imaging is necessary at this time.   I will suggest supportive care, rest, good hygiene and encourage the patient to take adequate fluids.  The patient is to return to clinic or EMERGENCY ROOM if symptoms worsen or change significantly e.g. ear pain, fever, purulent discharge from ears or bleeding.  Exitcare handout on eustachian tube dysfunction printed and given to patient.  Discussed with patient post nasal drip irritates throat/causes swelling blocks eustachian tubes from draining and fluid fills up middle ear.  Bacteria/viruses can grow in fluid and with moving head tube compressed and increases  pressure in tube/ear worsening pain.  Studies show will take 30 days for fluid to resolve after post nasal drip controlled with nasal steroid/antihistamine. Antibiotics and steroids do not speed up fluid removal.  Patient verbalized agreement and understanding of treatment plan and had no further questions at this time.  Colon cancer screening [place] in [place] something for last screen "years ago 20 years"  Afraid to get colonoscopy as spouse had to be hospitalized after his screening bad reaction unsure if prep or anesthesias.  She is open to FIT test discussed we do not have here but [facility] could order for her.  Instructed her to contact [facility] office and if not available from them we can refer her to network GI provider of her choice from [facility] network list.  Discussed sometimes occult blood loss through GI tract and could be contributing to her mildly decreased RBC count.  Also discussed could be  related to her smoking.  Exitcare handout on FIT and colon cancer screening printed and given to patient.  Patient verbalized understanding information/instructions, agreed with plan of care and had no further questions at this time.  Smoking cessation discussed patches/gum again today.  Her recurrent cough/sinusitis/anemia/fatigue most likely exacerbated by smoking.  I  recommended now may be great time to stop.  She will set a stop date and contact me for Rx when ready.  Exitcare handout printed and given on coping with quitting smoking.  Patient verbalized understanding information/instructions, agreed with plan of care and had no further questions at this time.  Worsening decreased RBCs could be related to smoking, age, diet as typically eats meat once a day.  Does eat avocado also.  Discussed increasing nuts in diet, start women's multivitamin.  Colon cancer screening.  Exitcare handout on anemia printed and given to patient discussed iron, B12, B6 intake.  Follow up with PCM consider lung  cancer screening due to smoking history.  Patient verbalized understanding information/instructions and had no further questions at this time.  Thyroid, Hgba1c, iron level normal testing earlier this year May 2021.  RBCs slightly lower than May 2021 last lab drawn 2 days ago.   Chronic fatigue worsening could be related to anemia, stressors  Exitcare printed and given to patient on fatigue and anemia.  Follow up with PM.  Patient verbalized understanding information/instructions, agreed with plan of care and had no further questions at this time.  Vitamin D 29.9 in May 2021 taking supplement 2000 international units daily.  Continue supplement oral and recheck Vitamin D level annually with PCM.  Patient verbalized understanding information/instructions and had no further questions at this time.  Flu vaccination discussed refused today but will consider in the future with RN Rolly Salter (vaccine available at clinic).  Discussed eligible for covid booster now also recommended but would need to schedule her in community (not available onsite).  She prefers to wait until more data regarding if she should get same or different manufacturer of covid booster.  Discussed wear mask in public, social distance, frequent handwashing.  Patient verbalized understanding information/instructions, agreed with plan of care and had no further questions at this time.

## 2020-06-20 ENCOUNTER — Telehealth: Payer: Self-pay | Admitting: Registered Nurse

## 2020-06-20 ENCOUNTER — Encounter: Payer: Self-pay | Admitting: Registered Nurse

## 2020-06-20 DIAGNOSIS — J301 Allergic rhinitis due to pollen: Secondary | ICD-10-CM

## 2020-06-20 NOTE — Telephone Encounter (Signed)
Patient reported she only took prednisone and her allergy medications and sinusitis cleared.  She did not start augmentin/rested over the weekend and has been back at work this week.  Did not obtain covid test as medication cleared symptoms.  Patient seen in warehouse today by RN Rolly Salter and I.  No nasal congestion, eyelid swelling, nasal sniffing/throat clearing or cough, spoke full sentences without difficulty, wearing cloth mask due to covid 19 pandemic protocols for employer.  Denied headache, body aches, sore throat, n/v/d, fever/chills.  Took tylenol and motrin on her days off.  Patient to follow up if new or worsening symptoms.  Discussed does not need to start augmentin since symptoms resolved with prednisone.  Patient verbalized understanding information/instructions, agreed with plan of care and had no further questions at this time.

## 2020-08-01 ENCOUNTER — Telehealth: Payer: Self-pay | Admitting: *Deleted

## 2020-08-01 ENCOUNTER — Encounter: Payer: Self-pay | Admitting: *Deleted

## 2020-08-01 DIAGNOSIS — S9002XA Contusion of left ankle, initial encounter: Secondary | ICD-10-CM

## 2020-08-01 MED ORDER — ACETAMINOPHEN 500 MG PO TABS
1000.0000 mg | ORAL_TABLET | Freq: Four times a day (QID) | ORAL | 0 refills | Status: AC | PRN
Start: 2020-08-01 — End: 2020-08-04

## 2020-08-01 NOTE — Telephone Encounter (Addendum)
Pt in to clinic reporting pain and bruising to L ankle. Sts she was rolling large trash can behind her to curb yesterday and can rolled up the back of her ankle. Now with bruising, mild swelling and tenderness to posterior-lateral L ankle. Able to ambulate with slight limp to keep some weight off extremity. Ace bandage applied for support and compression. Pt applied ice, biofreeze, and took Tylenol last night. Nothing yet today. Pt's reusable ice pack in freezer for her to pick up later today. Given Tylenol 650mg  from clinic OTC stock to take now and q4-6h for pain. Advised pt to RTC Thursday, 2 days, if not improving as expected. Pt verbalizes understanding and agreement with plan of care. No further questions/concerns.  Addendum 08/03/20 RN misunderstood pt during initial visit and thought injury occurred at home. However after NP spoke with pt and NP and RN reviewed incident report, it was confirmed that injury occurred at work while moving a rolling recycling/trash bin.

## 2020-08-01 NOTE — Telephone Encounter (Signed)
Reviewed RN Rolly Salter note and Safety officer injury report.  Injury occurred at work yesterday with recycle bin emptying.  Patient seen in warehouse and clinic today reported ace wrap and ice/pain medication helping a lot today and feeling better after RN Rolly Salter visit/care.  Gait sure and steady in warehouse, wearing ankle boots.  Patient to follow up prn, clinic closed tomorrow (every Wed) and reopens Thurs if she needs further assistance.  Patient verbalized understanding information/instructions, agreed with plan of care and had no further questions at this time.

## 2020-08-06 NOTE — Telephone Encounter (Signed)
Please follow up with patient schedule appt if no improvement

## 2020-10-10 ENCOUNTER — Ambulatory Visit: Payer: Self-pay | Admitting: Registered Nurse

## 2020-10-10 ENCOUNTER — Encounter: Payer: Self-pay | Admitting: Registered Nurse

## 2020-10-10 ENCOUNTER — Other Ambulatory Visit: Payer: Self-pay

## 2020-10-10 VITALS — BP 90/66 | HR 78 | Temp 97.6°F

## 2020-10-10 DIAGNOSIS — H6983 Other specified disorders of Eustachian tube, bilateral: Secondary | ICD-10-CM

## 2020-10-10 MED ORDER — SALINE SPRAY 0.65 % NA SOLN: 2.0000 | NASAL | 0 refills | Status: DC

## 2020-10-10 NOTE — Patient Instructions (Addendum)
Start augmentin 875mg  by mouth every 12 hours x10 days if fever/ear drainage, continuous ear throbbing pain  Otitis Media, Adult  Otitis media occurs when there is inflammation and fluid in the middle ear space with signs and symptoms of an acute infection. The middle ear is a part of the ear that contains bones for hearing as well as air that helps send sounds to the brain. When infected fluid builds up in this space, it causes pressure and results in symptoms of acute otitis media. The eustachian tube connects the middle ear to the back of the nose (nasopharynx) and normally allows air into the middle ear space. If the eustachian tube becomes blocked, fluid can build up and become infected. What are the causes? This condition is caused by a blockage in the eustachian tube. This can be caused by an object like mucus, or by swelling (edema) of the tube. Problems that can cause a blockage include:  A cold or other upper respiratory infection.  Allergies.  An irritant, such as tobacco smoke.  Enlarged adenoids. The adenoids are areas of soft tissue located high in the back of the throat, behind the nose and the roof of the mouth. They are part of the body's defense system (immune system).  A mass in the nasopharynx.  Damage to the ear caused by pressure changes (barotrauma). What are the signs or symptoms? Symptoms of this condition include:  Ear pain.  Fever.  Decreased hearing.  Tiredness (lethargy).  Fluid leaking from the ear, if the eardrum is ruptured or has burst.  Ringing in the ear. How is this diagnosed? This condition is diagnosed with a physical exam. During the exam, your health care provider will use an instrument called an otoscope to look in your ear and check for redness, swelling, and fluid. He or she will also ask about your symptoms. Your health care provider may also order tests, such as:  A pneumatic otoscopy. This is a test to check the movement of the  eardrum. It is done by squeezing a small amount of air into the ear.  A tympanogram is a test that shows how well the eardrum moves in response to air pressure in the ear canal. It provides a graph for your health care provider to review.   How is this treated? This condition can go away on its own within 3-5 days. But if the condition is caused by a bacterial infection and does not go away on its own, or if it keeps coming back, your health care provider may:  Prescribe antibiotic medicine to treat the infection.  Prescribe or recommend medicines to control pain. Follow these instructions at home:  Take over-the-counter and prescription medicines only as told by your health care provider.  If you were prescribed an antibiotic medicine, take it as told by your health care provider. Do not stop taking the antibiotic even if you start to feel better.  Keep all follow-up visits as told by your health care provider. This is important. Contact a health care provider if:  You have bleeding from your nose.  There is a lump on your neck.  You are not feeling better in 5 days.  You feel worse instead of better. Get help right away if:  You have severe pain that is not controlled with medicine.  You have swelling, redness, or pain around your ear.  You have stiffness in your neck.  A part of your face is not moving (paralyzed).  The  bone behind your ear (mastoid) is tender when you touch it.  You develop a severe headache. Summary  Otitis media is redness, soreness, and swelling of the middle ear, usually resulting in pain.  This condition can go away on its own within 3-5 days.  If the problem does not go away in 3-5 days, your health care provider may prescribe or recommend medicines to treat the infection or your symptoms.  If you were prescribed an antibiotic medicine, take it as told by your health care provider.  Follow all instructions you were given by your health care  provider. This information is not intended to replace advice given to you by your health care provider. Make sure you discuss any questions you have with your health care provider. Document Revised: 07/15/2019 Document Reviewed: 07/15/2019 Elsevier Patient Education  2021 [employer] Eustachian Tube Dysfunction  Eustachian tube dysfunction refers to a condition in which a blockage develops in the narrow passage that connects the middle ear to the back of the nose (eustachian tube). The eustachian tube regulates air pressure in the middle ear by letting air move between the ear and nose. It also helps to drain fluid from the middle ear space. Eustachian tube dysfunction can affect one or both ears. When the eustachian tube does not function properly, air pressure, fluid, or both can build up in the middle ear. What are the causes? This condition occurs when the eustachian tube becomes blocked or cannot open normally. Common causes of this condition include:  Ear infections.  Colds and other infections that affect the nose, mouth, and throat (upper respiratory tract).  Allergies.  Irritation from cigarette smoke.  Irritation from stomach acid coming up into the esophagus (gastroesophageal reflux). The esophagus is the tube that carries food from the mouth to the stomach.  Sudden changes in air pressure, such as from descending in an airplane or scuba diving.  Abnormal growths in the nose or throat, such as: ? Growths that line the nose (nasal polyps). ? Abnormal growth of cells (tumors). ? Enlarged tissue at the back of the throat (adenoids). What increases the risk? You are more likely to develop this condition if:  You smoke.  You are overweight.  You are a child who has: ? Certain birth defects of the mouth, such as cleft palate. ? Large tonsils or adenoids. What are the signs or symptoms? Common symptoms of this condition include:  A feeling of fullness in the ear.  Ear  pain.  Clicking or popping noises in the ear.  Ringing in the ear.  Hearing loss.  Loss of balance.  Dizziness. Symptoms may get worse when the air pressure around you changes, such as when you travel to an area of high elevation, fly on an airplane, or go scuba diving. How is this diagnosed? This condition may be diagnosed based on:  Your symptoms.  A physical exam of your ears, nose, and throat.  Tests, such as those that measure: ? The movement of your eardrum (tympanogram). ? Your hearing (audiometry). How is this treated? Treatment depends on the cause and severity of your condition.  In mild cases, you may relieve your symptoms by moving air into your ears. This is called "popping the ears."  In more severe cases, or if you have symptoms of fluid in your ears, treatment may include: ? Medicines to relieve congestion (decongestants). ? Medicines that treat allergies (antihistamines). ? Nasal sprays or ear drops that contain medicines that reduce swelling (steroids). ?  A procedure to drain the fluid in your eardrum (myringotomy). In this procedure, a small tube is placed in the eardrum to:  Drain the fluid.  Restore the air in the middle ear space. ? A procedure to insert a balloon device through the nose to inflate the opening of the eustachian tube (balloon dilation). Follow these instructions at home: Lifestyle  Do not do any of the following until your health care provider approves: ? Travel to high altitudes. ? Fly in airplanes. ? Work in a Estate agent or room. ? Scuba dive.  Do not use any products that contain nicotine or tobacco, such as cigarettes and e-cigarettes. If you need help quitting, ask your health care provider.  Keep your ears dry. Wear fitted earplugs during showering and bathing. Dry your ears completely after. General instructions  Take over-the-counter and prescription medicines only as told by your health care provider.  Use  techniques to help pop your ears as recommended by your health care provider. These may include: ? Chewing gum. ? Yawning. ? Frequent, forceful swallowing. ? Closing your mouth, holding your nose closed, and gently blowing as if you are trying to blow air out of your nose.  Keep all follow-up visits as told by your health care provider. This is important. Contact a health care provider if:  Your symptoms do not go away after treatment.  Your symptoms come back after treatment.  You are unable to pop your ears.  You have: ? A fever. ? Pain in your ear. ? Pain in your head or neck. ? Fluid draining from your ear.  Your hearing suddenly changes.  You become very dizzy.  You lose your balance. Summary  Eustachian tube dysfunction refers to a condition in which a blockage develops in the eustachian tube.  It can be caused by ear infections, allergies, inhaled irritants, or abnormal growths in the nose or throat.  Symptoms include ear pain, hearing loss, or ringing in the ears.  Mild cases are treated with maneuvers to unblock the ears, such as yawning or ear popping.  Severe cases are treated with medicines. Surgery may also be done (rare). This information is not intended to replace advice given to you by your health care provider. Make sure you discuss any questions you have with your health care provider. Document Revised: 12/02/2017 Document Reviewed: 12/02/2017 Elsevier Patient Education  2021 [employer].

## 2020-10-10 NOTE — Progress Notes (Signed)
Subjective:    Patient ID: Rebecca Sweeney, female    DOB: June 29, 1961, 60 y.o.   MRN: [id number]  59y/o Caucasian established female pt c/o R ear pain with now with pressure moving to R eye and R nostril. Discomfort over R eustachian tube. Has been taking singulair and flonase nasal.  Denied fever/chills/cough/dyspnea/dysphagia/ear discharge.  History of eustachian tube dysfunction and recurrent sinusitis with seasonal allergic rhinitis and smoker.     Review of Systems  Constitutional: Negative for activity change, appetite change, chills, diaphoresis, fatigue and fever.  HENT: Positive for ear pain, sinus pressure and sinus pain. Negative for congestion, ear discharge, facial swelling, hearing loss, nosebleeds, sneezing, sore throat, tinnitus, trouble swallowing and voice change.   Eyes: Negative for photophobia and visual disturbance.  Respiratory: Negative for cough, shortness of breath, wheezing and stridor.   Cardiovascular: Negative for chest pain.  Gastrointestinal: Negative for diarrhea and vomiting.  Endocrine: Negative for cold intolerance and heat intolerance.  Genitourinary: Negative for difficulty urinating.  Musculoskeletal: Negative for gait problem, myalgias, neck pain and neck stiffness.  Allergic/Immunologic: Positive for environmental allergies. Negative for food allergies.  Neurological: Negative for dizziness, tremors, seizures, syncope, facial asymmetry, speech difficulty, weakness, light-headedness, numbness and headaches.  Hematological: Negative for adenopathy. Does not bruise/bleed easily.  Psychiatric/Behavioral: Negative for agitation, confusion and sleep disturbance.       Objective:   Physical Exam Vitals and nursing note reviewed.  Constitutional:      General: She is awake and active. She is not in acute distress.    Appearance: Normal appearance. She is well-developed, well-groomed, normal weight and well-nourished. She is not ill-appearing,  toxic-appearing, sickly-appearing or diaphoretic.  HENT:     Head: Normocephalic and atraumatic.     Jaw: There is normal jaw occlusion. No trismus.     Salivary Glands: Right salivary gland is not diffusely enlarged or tender. Left salivary gland is not diffusely enlarged or tender.     Right Ear: Hearing, ear canal and external ear normal. A middle ear effusion is present.     Left Ear: Hearing, ear canal and external ear normal. A middle ear effusion is present.     Nose: Mucosal edema present. No nasal deformity, septal deviation, laceration, sinus tenderness, congestion, rhinorrhea, nasal septal hematoma, epistaxis or foreign body.     Right Sinus: No maxillary sinus tenderness or frontal sinus tenderness.     Left Sinus: No maxillary sinus tenderness or frontal sinus tenderness.     Mouth/Throat:     Mouth: Mucous membranes are normal. Mucous membranes are not pale, not dry and not cyanotic.     Pharynx: Oropharynx is clear. Posterior oropharyngeal edema present.     Comments: Bilateral allergic shiners; bilateral TMs air fluid level with some cloudiness 4-7 oclock bilaterally no erythema or debris noted in auditory canals Eyes:     General: Lids are normal. Vision grossly intact. Gaze aligned appropriately. Allergic shiner present. No visual field deficit or scleral icterus.       Right eye: No foreign body, discharge or hordeolum.        Left eye: No foreign body, discharge or hordeolum.     Extraocular Movements: Extraocular movements intact and EOM normal.     Right eye: Normal extraocular motion and no nystagmus.     Left eye: Normal extraocular motion and no nystagmus.     Conjunctiva/sclera: Conjunctivae normal.     Right eye: Right conjunctiva is not injected. No chemosis, exudate or hemorrhage.  Left eye: Left conjunctiva is not injected. No chemosis, exudate or hemorrhage.    Pupils: Pupils are equal, round, and reactive to light. Pupils are equal.     Right eye: Pupil is  round and reactive.     Left eye: Pupil is round and reactive.  Neck:     Thyroid: No thyroid mass or thyromegaly.     Trachea: Trachea and phonation normal. No tracheal tenderness or tracheal deviation.  Cardiovascular:     Rate and Rhythm: Normal rate and regular rhythm.     Pulses: Normal pulses and intact distal pulses.          Radial pulses are 2+ on the right side and 2+ on the left side.     Heart sounds: S1 normal and S2 normal.  Pulmonary:     Effort: Pulmonary effort is normal. No accessory muscle usage or respiratory distress.     Breath sounds: Normal breath sounds and air entry. No stridor or transmitted upper airway sounds. No decreased breath sounds, wheezing, rhonchi or rales.     Comments: Wearing mask disposable due to covid pandemic; spoke full sentences without difficulty; no cough/nasal sniffing/throat clearing noted in exam room Chest:     Chest wall: No tenderness.  Abdominal:     General: Abdomen is flat. There is no distension.     Palpations: Abdomen is soft.  Musculoskeletal:        General: No swelling, tenderness, deformity, signs of injury or edema. Normal range of motion.     Right shoulder: Normal.     Left shoulder: Normal.     Right elbow: Normal.     Left elbow: Normal.     Right hand: Normal.     Left hand: Normal.     Cervical back: Normal, normal range of motion and neck supple. No swelling, edema, deformity, erythema, signs of trauma, lacerations, rigidity, torticollis, tenderness or crepitus. No pain with movement. Normal range of motion. Normal.     Thoracic back: Normal.     Lumbar back: Normal.     Right hip: Normal.     Left hip: Normal.     Right knee: Normal.     Left knee: Normal.  Lymphadenopathy:     Head:     Right side of head: No submental, submandibular, tonsillar, preauricular, posterior auricular or occipital adenopathy.     Left side of head: No submental, submandibular, tonsillar, preauricular, posterior auricular or  occipital adenopathy.     Cervical: No cervical adenopathy.     Right cervical: No superficial cervical adenopathy.    Left cervical: No superficial cervical adenopathy.  Skin:    General: Skin is warm, dry and intact.     Capillary Refill: Capillary refill takes less than 2 seconds.     Coloration: Skin is not ashen, cyanotic, jaundiced, mottled, pale or sallow.     Findings: No abrasion, abscess, acne, bruising, burn, ecchymosis, erythema, signs of injury, laceration, lesion, petechiae, rash or wound.     Nails: There is no clubbing or cyanosis.  Neurological:     General: No focal deficit present.     Mental Status: She is alert and oriented to person, place, and time. Mental status is at baseline. She is not disoriented.     GCS: GCS eye subscore is 4. GCS verbal subscore is 5. GCS motor subscore is 6.     Cranial Nerves: Cranial nerves are intact. No cranial nerve deficit, dysarthria or facial asymmetry.  Sensory: Sensation is intact. No sensory deficit.     Motor: Motor function is intact. No weakness, tremor, atrophy, abnormal muscle tone or seizure activity.     Coordination: Coordination is intact. Coordination normal.     Gait: Gait is intact. Gait normal.     Deep Tendon Reflexes: Strength normal.     Comments: In/out of chair without difficulty; gait sure and steady in clinic; bilateral hand grasp equal 5/5  Psychiatric:        Attention and Perception: Attention and perception normal.        Mood and Affect: Mood and affect, mood and affect normal.        Speech: Speech normal.        Behavior: Behavior normal. Behavior is cooperative.        Thought Content: Thought content normal.        Cognition and Memory: Cognition, memory and cognition and memory normal.        Judgment: Judgment normal.           Assessment & Plan:  A-bilateral eustachian tube dysfunction  P- given 4 UD tylenol 1000mg  po q6h prn pain from clinic stock.  If ear discharge/fever/continuous  throbbing ear pain start augmentin 875mg  po BID x 10 days #20 RF0 dispensed from [facility] to patient today.  Discussed spring bloom has started bushes/bulbs/plants/grass due to recent 65 degree weather ensure using normal saline nasal spray 2 sprays each nostril q2h wa as needed. flonase 03-17-1991 1 spray each nostril BID   Patient denied personal or family history of ENT cancer.  OTC antihistamine of choice claritin 10mg  po daily.  Avoid triggers if possible.  Shower prior to bedtime if exposed to triggers.  If allergic dust/dust mites recommend mattress/pillow covers/encasements; washing linens, vacuuming, sweeping, dusting weekly.  No evidence of invasive bacterial infection, non toxic and well hydrated.  I do not see where any further testing or imaging is necessary at this time.   I will suggest supportive care, rest, good hygiene and encourage the patient to take adequate fluids.  The patient is to return to clinic or EMERGENCY ROOM if symptoms worsen or change significantly e.g. ear pain, fever, purulent discharge from ears or bleeding.  [facility]care handout on otitis media and eustachian tube dysfunction.  Discussed with patient post nasal drip irritates throat/causes swelling blocks eustachian tubes from draining and fluid fills up middle ear.  Bacteria/viruses can grow in fluid and with moving head tube compressed and increases pressure in tube/ear worsening pain.  Studies show will take 30 days for fluid to resolve after post nasal drip controlled with nasal steroid/antihistamine. Antibiotics and steroids do not speed up fluid removal.  Patient verbalized agreement and understanding of treatment plan and had no further questions at this time.

## 2020-11-07 ENCOUNTER — Other Ambulatory Visit: Payer: Self-pay | Admitting: *Deleted

## 2020-11-07 MED ORDER — BENZONATATE 200 MG PO CAPS: 200.0000 mg | ORAL_CAPSULE | Freq: Two times a day (BID) | ORAL | 1 refills | Status: DC | PRN

## 2020-11-28 ENCOUNTER — Ambulatory Visit: Payer: Self-pay | Admitting: Registered Nurse

## 2020-11-28 ENCOUNTER — Other Ambulatory Visit: Payer: Self-pay

## 2020-11-28 VITALS — BP 91/69 | HR 64 | Temp 96.7°F

## 2020-11-28 DIAGNOSIS — H6983 Other specified disorders of Eustachian tube, bilateral: Secondary | ICD-10-CM

## 2020-11-28 DIAGNOSIS — L247 Irritant contact dermatitis due to plants, except food: Secondary | ICD-10-CM

## 2020-11-28 DIAGNOSIS — J0101 Acute recurrent maxillary sinusitis: Secondary | ICD-10-CM

## 2020-11-28 MED ORDER — SALINE SPRAY 0.65 % NA SOLN: 2.0000 | NASAL | 0 refills | Status: DC

## 2020-11-28 MED ORDER — PREDNISONE 10 MG (21) PO TBPK: ORAL_TABLET | ORAL | 0 refills | Status: DC

## 2020-11-28 MED ORDER — AMOXICILLIN-POT CLAVULANATE 875-125 MG PO TABS: 1.0000 | ORAL_TABLET | Freq: Two times a day (BID) | ORAL | 0 refills | Status: AC

## 2020-11-28 NOTE — Progress Notes (Signed)
Subjective:    Patient ID: Rebecca Sweeney, female    DOB: 06/13/1961, 60 y.o.   MRN: [id number]  60y/o Caucasian established female pt c/o sinus pain/pressure, R ear pain x1 week. Also with dry cough, upper dental pain started yesterday.   Eating bread hurt upper teeth last night.  Having some post nasal drip.  Feels like she needs steroids again.  Denied fever/chills/ear discharge/trouble swallowing/breathing/wheezing.  Last covid vaccination #28 Aug 2020 booster per patient.  Does not have card with her for transcription today.  Stated she also gave [employer] a copy.  Denied known sick contacts covid or otherwise.  Seasonal allergies have been flaring with spring bloom and she has been taking her allergy medication oral and nasal sprays.  Last augmentin and prednisone taper Oct 2021 worked well.  Working in garden this weekend pulling poison ivy got outbreak on leg itching denied purulent discharge/pain/spreading.     Review of Systems  Constitutional: Positive for fatigue. Negative for activity change, appetite change, chills, diaphoresis and fever.  HENT: Positive for congestion, ear pain, postnasal drip, rhinorrhea, sinus pressure and sinus pain. Negative for dental problem, ear discharge, facial swelling, hearing loss, mouth sores, nosebleeds, trouble swallowing and voice change.   Eyes: Negative for photophobia and visual disturbance.  Respiratory: Negative for shortness of breath, wheezing and stridor.   Cardiovascular: Negative for chest pain.  Gastrointestinal: Negative for diarrhea, nausea and vomiting.  Endocrine: Negative for cold intolerance and heat intolerance.  Genitourinary: Negative for difficulty urinating.  Musculoskeletal: Negative for gait problem, myalgias, neck pain and neck stiffness.  Skin: Negative for rash.  Allergic/Immunologic: Positive for environmental allergies. Negative for food allergies.  Neurological: Positive for headaches. Negative for dizziness,  tremors, seizures, syncope, facial asymmetry, speech difficulty, weakness, light-headedness and numbness.  Hematological: Negative for adenopathy. Does not bruise/bleed easily.  Psychiatric/Behavioral: Positive for sleep disturbance. Negative for agitation and confusion.       Objective:   Physical Exam Vitals and nursing note reviewed.  Constitutional:      General: She is awake. She is not in acute distress.    Appearance: Normal appearance. She is well-developed, well-groomed and normal weight. She is ill-appearing. She is not toxic-appearing or diaphoretic.  HENT:     Head: Normocephalic and atraumatic.     Jaw: There is normal jaw occlusion. No trismus.     Salivary Glands: Right salivary gland is not diffusely enlarged or tender. Left salivary gland is not diffusely enlarged or tender.     Right Ear: Hearing, ear canal and external ear normal. A middle ear effusion is present. There is no impacted cerumen.     Left Ear: Hearing, ear canal and external ear normal. A middle ear effusion is present. There is no impacted cerumen.     Nose: Mucosal edema, congestion and rhinorrhea present. No nasal deformity, septal deviation, signs of injury, laceration or nasal tenderness. Rhinorrhea is clear.     Right Turbinates: Enlarged and swollen. Not pale.     Left Turbinates: Enlarged and swollen. Not pale.     Right Sinus: Maxillary sinus tenderness and frontal sinus tenderness present.     Left Sinus: Maxillary sinus tenderness and frontal sinus tenderness present.     Comments: Maxillary greater than frontal sinuses bilaterally TTP; cobblestoning posterior pharynx; oropharynx erythema rash, bilateral allergic shiners and lower eyelids nonpitting edema 1+/4; clear discharge bilateral nasal turbinates; bilateral TMS air fluid level 50% cloudy/opacity centrally; TMs intact bilaterally; nasal congestion noted with speaking;  Mouth/Throat:     Lips: Pink. No lesions.     Mouth: Mucous membranes  are moist. Mucous membranes are not pale, not dry and not cyanotic. No injury, lacerations, oral lesions or angioedema.     Dentition: Has dentures. No gingival swelling, dental caries, dental abscesses or gum lesions.     Tongue: No lesions. Tongue does not deviate from midline.     Palate: Lesions present. No mass.     Pharynx: Uvula midline. Pharyngeal swelling and posterior oropharyngeal erythema present. No oropharyngeal exudate or uvula swelling.     Tonsils: No tonsillar exudate or tonsillar abscesses. 0 on the right. 0 on the left.  Eyes:     General: Lids are normal. Vision grossly intact. Gaze aligned appropriately. Allergic shiner present. No visual field deficit or scleral icterus.       Right eye: No foreign body, discharge or hordeolum.        Left eye: No foreign body, discharge or hordeolum.     Extraocular Movements: Extraocular movements intact.     Right eye: Normal extraocular motion and no nystagmus.     Left eye: Normal extraocular motion and no nystagmus.     Conjunctiva/sclera: Conjunctivae normal.     Right eye: Right conjunctiva is not injected. No chemosis, exudate or hemorrhage.    Left eye: Left conjunctiva is not injected. No chemosis, exudate or hemorrhage.    Pupils: Pupils are equal, round, and reactive to light. Pupils are equal.     Right eye: Pupil is round and reactive.     Left eye: Pupil is round and reactive.  Neck:     Trachea: Trachea and phonation normal. No tracheal tenderness or tracheal deviation.      Comments: TTP posterior cervical lymph nodes and path of eustachian tubes bilaterally Cardiovascular:     Rate and Rhythm: Normal rate and regular rhythm.     Chest Wall: PMI is not displaced.     Pulses: Normal pulses.          Radial pulses are 2+ on the right side and 2+ on the left side.     Heart sounds: Normal heart sounds, S1 normal and S2 normal. No murmur heard. No friction rub. No gallop.   Pulmonary:     Effort: Pulmonary effort is  normal. No accessory muscle usage or respiratory distress.     Breath sounds: Normal breath sounds and air entry. No stridor or transmitted upper airway sounds. No decreased breath sounds, wheezing, rhonchi or rales.     Comments: Spoke full sentences without difficulty; no cough noted in exam room;  Chest:     Chest wall: No tenderness.  Abdominal:     General: Abdomen is flat. There is no distension.     Palpations: Abdomen is soft.  Musculoskeletal:        General: No swelling. Normal range of motion.     Right shoulder: Normal.     Left shoulder: Normal.     Right elbow: Normal.     Left elbow: Normal.     Right hand: Normal.     Left hand: Normal.     Cervical back: Normal range of motion and neck supple. Tenderness present. No swelling, edema, deformity, erythema, signs of trauma, lacerations, rigidity, torticollis or crepitus. No pain with movement or muscular tenderness. Normal range of motion.     Thoracic back: Normal. No swelling, edema, deformity, signs of trauma or lacerations.     Lumbar back:  Normal. No swelling, edema, deformity, signs of trauma or lacerations. Normal range of motion.     Right hip: Normal.     Left hip: Normal.     Right knee: Normal.     Left knee: Normal.     Right lower leg: No edema.     Left lower leg: No edema.  Lymphadenopathy:     Head:     Right side of head: No submental, submandibular, tonsillar, preauricular, posterior auricular or occipital adenopathy.     Left side of head: No submental, submandibular, tonsillar, preauricular, posterior auricular or occipital adenopathy.     Cervical: Cervical adenopathy present.     Right cervical: Posterior cervical adenopathy present. No superficial or deep cervical adenopathy.    Left cervical: Posterior cervical adenopathy present. No superficial or deep cervical adenopathy.     Comments: Bilaterally TTP no shottiness  Skin:    General: Skin is warm and dry.     Capillary Refill: Capillary refill  takes less than 2 seconds.     Coloration: Skin is not ashen, cyanotic, jaundiced, mottled, pale or sallow.     Findings: Abrasion and rash present. No abscess, acne, bruising, burn, ecchymosis, erythema, signs of injury, laceration, lesion, petechiae or wound. Rash is macular and papular. Rash is not crusting, nodular, purpuric, pustular, scaling, urticarial or vesicular.     Nails: There is no clubbing.       Neurological:     General: No focal deficit present.     Mental Status: She is alert and oriented to person, place, and time. Mental status is at baseline. She is not disoriented.     GCS: GCS eye subscore is 4. GCS verbal subscore is 5. GCS motor subscore is 6.     Cranial Nerves: Cranial nerves are intact. No cranial nerve deficit, dysarthria or facial asymmetry.     Sensory: Sensation is intact. No sensory deficit.     Motor: Motor function is intact. No weakness, tremor, atrophy, abnormal muscle tone or seizure activity.     Coordination: Coordination is intact. Coordination normal.     Gait: Gait is intact. Gait normal.     Comments: Gait sure and steady in exam room; bilateral hand grasp equal 5/5; in/out of chair and on/off exam table without difficulty  Psychiatric:        Attention and Perception: Attention and perception normal.        Mood and Affect: Mood and affect normal.        Speech: Speech normal.        Behavior: Behavior normal. Behavior is cooperative.        Thought Content: Thought content normal.        Cognition and Memory: Cognition and memory normal.        Judgment: Judgment normal.           Assessment & Plan:  A-acute recurrent maxillary sinusitis, eustachian tube dysfunction bilaterally, irritant contact dermatitis from plants  P-Typically patient reports relief with augmentin and when bad prednisone 60mg  taper.  She says today is bad teeth hurting.  Prednisone 10mg  po daily with breakfast (60mg  day 1; 50 day 2; 40 day 3; 30 day 4; 20mg  day 5  and 10mg  day 6 #21 RF0 dispensed from [facility] to patient.  Consider OTC phenylephrine 5-10mg  po q6h prn rhinitis/congestion   Continue aggressive saline use 2 sprays each nostril q2h wa and in shower especially prior to bedtime.   Continue flonase 1 spray  each nostril BID.  If no improvement with 48 hours of saline, prednisone taper and flonase use start augmentin 875mg  po BID x 10 days #20 RF0 dispensed from [facility] to patient  Denied personal or family history of ENT cancer.  Shower BID especially prior to bed. No evidence of systemic bacterial infection, non toxic and well hydrated.  I do not see where any further testing or imaging is necessary at this time.   I will suggest supportive care, rest, good hygiene and encourage the patient to take adequate fluids.  The patient is to return to clinic or EMERGENCY ROOM if symptoms worsen or change significantly.  Exitcare handouts on sinusitis and sinus rinse printed and given to patient.  Patient verbalized agreement and understanding of treatment plan and had no further questions at this time.   P2:  Hand washing and cover cough  Patient up to date with covid vaccines if no improvement symptoms 48 hours medications will start quarantine to rule out covid.  Continue claritin 10mg  po daily and singulair 10mg  po qhs.    No evidence of invasive bacterial infection, non toxic and well hydrated.  I do not see where any further testing or imaging is necessary at this time.   I will suggest supportive care, rest, good hygiene and encourage the patient to take adequate fluids.  The patient is to return to clinic or EMERGENCY ROOM if symptoms worsen or change significantly e.g. ear pain, fever, purulent discharge from ears or bleeding.  Exitcare handout on eustachian tube dysfunction printed and given to patient.  Discussed with patient post nasal drip irritates throat/causes swelling blocks eustachian tubes from draining and fluid fills up middle ear.  Bacteria/viruses can  grow in fluid and with moving head tube compressed and increases pressure in tube/ear worsening pain.  Studies show will take 30 days for fluid to resolve after post nasal drip controlled with nasal steroid/antihistamine. Antibiotics and steroids do not speed up fluid removal.  Patient verbalized agreement and understanding of treatment plan and had no further questions at this time.  Taking claritin 10mg  po daily may switch to zyrtec 10mg  po BID prn itching.  calagel thin smear BID prn itching given 4 UD from clinic stock; do not get in eyes; if worsening with calagel use stop and trial hydrocortisone 1% topical BID small smear affected area only.  Patient starting prednisone oral taper for sinusitis will also be beneficial to rash.  Consider poison ivy blocker lotion/wash.  Patient was wearing long pants/gloves/and long sleeve shirt.  Wash hands before and after application.  Avoid hot steam showers.  Apply emollient twice a day e.g. Fragrance free vaseline.  May apply ice/cold compress 5 minutes QID prn itching/swelling.  Avoid harsh/abrasive soaps use fragrance free/sensitive like dove/cetaphil.    Medication as directed. Call or return to clinic as needed if these symptoms worsen or fail to improve as anticipated. Exitcare handout on contact dermatitis and pruritis  Follow up for re-evaluation in 48 hours if no improvement and/or worsening of rash with plan of care. Patient verbalized agreement and understanding of treatment plan and had no further questions at this time

## 2020-11-28 NOTE — Patient Instructions (Signed)
Eustachian Tube Dysfunction  Eustachian tube dysfunction refers to a condition in which a blockage develops in the narrow passage that connects the middle ear to the back of the nose (eustachian tube). The eustachian tube regulates air pressure in the middle ear by letting air move between the ear and nose. It also helps to drain fluid from the middle ear space. Eustachian tube dysfunction can affect one or both ears. When the eustachian tube does not function properly, air pressure, fluid, or both can build up in the middle ear. What are the causes? This condition occurs when the eustachian tube becomes blocked or cannot open normally. Common causes of this condition include:  Ear infections.  Colds and other infections that affect the nose, mouth, and throat (upper respiratory tract).  Allergies.  Irritation from cigarette smoke.  Irritation from stomach acid coming up into the esophagus (gastroesophageal reflux). The esophagus is the tube that carries food from the mouth to the stomach.  Sudden changes in air pressure, such as from descending in an airplane or scuba diving.  Abnormal growths in the nose or throat, such as: ? Growths that line the nose (nasal polyps). ? Abnormal growth of cells (tumors). ? Enlarged tissue at the back of the throat (adenoids). What increases the risk? You are more likely to develop this condition if:  You smoke.  You are overweight.  You are a child who has: ? Certain birth defects of the mouth, such as cleft palate. ? Large tonsils or adenoids. What are the signs or symptoms? Common symptoms of this condition include:  A feeling of fullness in the ear.  Ear pain.  Clicking or popping noises in the ear.  Ringing in the ear.  Hearing loss.  Loss of balance.  Dizziness. Symptoms may get worse when the air pressure around you changes, such as when you travel to an area of high elevation, fly on an airplane, or go scuba diving. How is  this diagnosed? This condition may be diagnosed based on:  Your symptoms.  A physical exam of your ears, nose, and throat.  Tests, such as those that measure: ? The movement of your eardrum (tympanogram). ? Your hearing (audiometry). How is this treated? Treatment depends on the cause and severity of your condition.  In mild cases, you may relieve your symptoms by moving air into your ears. This is called "popping the ears."  In more severe cases, or if you have symptoms of fluid in your ears, treatment may include: ? Medicines to relieve congestion (decongestants). ? Medicines that treat allergies (antihistamines). ? Nasal sprays or ear drops that contain medicines that reduce swelling (steroids). ? A procedure to drain the fluid in your eardrum (myringotomy). In this procedure, a small tube is placed in the eardrum to:  Drain the fluid.  Restore the air in the middle ear space. ? A procedure to insert a balloon device through the nose to inflate the opening of the eustachian tube (balloon dilation). Follow these instructions at home: Lifestyle  Do not do any of the following until your health care provider approves: ? Travel to high altitudes. ? Fly in airplanes. ? Work in a pressurized cabin or room. ? Scuba dive.  Do not use any products that contain nicotine or tobacco, such as cigarettes and e-cigarettes. If you need help quitting, ask your health care provider.  Keep your ears dry. Wear fitted earplugs during showering and bathing. Dry your ears completely after. General instructions  Take over-the-counter   and prescription medicines only as told by your health care provider.  Use techniques to help pop your ears as recommended by your health care provider. These may include: ? Chewing gum. ? Yawning. ? Frequent, forceful swallowing. ? Closing your mouth, holding your nose closed, and gently blowing as if you are trying to blow air out of your nose.  Keep all  follow-up visits as told by your health care provider. This is important. Contact a health care provider if:  Your symptoms do not go away after treatment.  Your symptoms come back after treatment.  You are unable to pop your ears.  You have: ? A fever. ? Pain in your ear. ? Pain in your head or neck. ? Fluid draining from your ear.  Your hearing suddenly changes.  You become very dizzy.  You lose your balance. Summary  Eustachian tube dysfunction refers to a condition in which a blockage develops in the eustachian tube.  It can be caused by ear infections, allergies, inhaled irritants, or abnormal growths in the nose or throat.  Symptoms include ear pain, hearing loss, or ringing in the ears.  Mild cases are treated with maneuvers to unblock the ears, such as yawning or ear popping.  Severe cases are treated with medicines. Surgery may also be done (rare). This information is not intended to replace advice given to you by your health care provider. Make sure you discuss any questions you have with your health care provider. Document Revised: 12/02/2017 Document Reviewed: 12/02/2017 Elsevier Patient Education  2021 [place]. How to Perform a Sinus Rinse A sinus rinse is a home treatment that is used to rinse your sinuses with a sterile mixture of salt and water (saline solution). Sinuses are air-filled spaces in your skull behind the bones of your face and forehead that open into your nasal cavity. A sinus rinse can help to clear mucus, dirt, dust, or pollen from your nasal cavity. You may do a sinus rinse when you have a cold, a virus, nasal allergy symptoms, a sinus infection, or stuffiness in your nose or sinuses. Talk with your health care provider about whether a sinus rinse might help you. What are the risks? A sinus rinse is generally safe and effective. However, there are a few risks, which include:  A burning sensation in your sinuses. This may happen if you do  not make the saline solution as directed. Be sure to follow all directions when making the saline solution.  Nasal irritation.  Infection from contaminated water. This is rare, but possible. Do not do a sinus rinse if you have had ear or nasal surgery, ear infection, or blocked ears. Supplies needed:  Saline solution or powder.  Distilled or sterile water to mix with saline powder. ? You may use boiled and cooled tap water. Boil tap water for 5 minutes; cool until it is lukewarm. Use within 24 hours. ? Do not use regular tap water to mix with the saline solution.  Neti pot or nasal rinse bottle. These supplies release the saline solution into your nose and through your sinuses. Neti pots and nasal rinse bottles can be purchased at [employer], a health food store, or online. How to perform a sinus rinse 1. Wash your hands with soap and water. 2. Wash your device according to the directions that came with the product and then dry it. 3. Use the solution that comes with your product or one that is sold separately in stores. Follow  the mixing directions on the package to mix with sterile or distilled water. 4. Fill the device with the amount of saline solution noted in the device instructions. 5. Stand over a sink and tilt your head sideways over the sink. 6. Place the spout of the device in your upper nostril (the one closer to the ceiling). 7. Gently pour or squeeze the saline solution into your nasal cavity. The liquid should drain out from the lower nostril if you are not too congested. 8. While rinsing, breathe through your open mouth. 9. Gently blow your nose to clear any mucus and rinse solution. Blowing too hard may cause ear pain. 10. Repeat in your other nostril. 11. Clean and rinse your device with clean water and then air-dry it. Talk with your health care provider or pharmacist if you have questions about how to do a sinus rinse.   Summary  A sinus rinse is a home  treatment that is used to rinse your sinuses with a sterile mixture of salt and water (saline solution).  A sinus rinse is generally safe and effective. Follow all instructions carefully.  Before doing a sinus rinse, talk with your health care provider about whether it would be helpful for you. This information is not intended to replace advice given to you by your health care provider. Make sure you discuss any questions you have with your health care provider. Document Revised: 05/23/2020 Document Reviewed: 05/23/2020 Elsevier Patient Education  2021 [place]. Sinusitis, Adult Sinusitis is inflammation of your sinuses. Sinuses are hollow spaces in the bones around your face. Your sinuses are located:  Around your eyes.  In the middle of your forehead.  Behind your nose.  In your cheekbones. Mucus normally drains out of your sinuses. When your nasal tissues become inflamed or swollen, mucus can become trapped or blocked. This allows bacteria, viruses, and fungi to grow, which leads to infection. Most infections of the sinuses are caused by a virus. Sinusitis can develop quickly. It can last for up to 4 weeks (acute) or for more than 12 weeks (chronic). Sinusitis often develops after a cold. What are the causes? This condition is caused by anything that creates swelling in the sinuses or stops mucus from draining. This includes:  Allergies.  Asthma.  Infection from bacteria or viruses.  Deformities or blockages in your nose or sinuses.  Abnormal growths in the nose (nasal polyps).  Pollutants, such as chemicals or irritants in the air.  Infection from fungi (rare). What increases the risk? You are more likely to develop this condition if you:  Have a weak body defense system (immune system).  Do a lot of swimming or diving.  Overuse nasal sprays.  Smoke. What are the signs or symptoms? The main symptoms of this condition are pain and a feeling of pressure around  the affected sinuses. Other symptoms include:  Stuffy nose or congestion.  Thick drainage from your nose.  Swelling and warmth over the affected sinuses.  Headache.  Upper toothache.  A cough that may get worse at night.  Extra mucus that collects in the throat or the back of the nose (postnasal drip).  Decreased sense of smell and taste.  Fatigue.  A fever.  Sore throat.  Bad breath. How is this diagnosed? This condition is diagnosed based on:  Your symptoms.  Your medical history.  A physical exam.  Tests to find out if your condition is acute or chronic. This may include: ? Checking your nose  for nasal polyps. ? Viewing your sinuses using a device that has a light (endoscope). ? Testing for allergies or bacteria. ? Imaging tests, such as an MRI or CT scan. In rare cases, a bone biopsy may be done to rule out more serious types of fungal sinus disease. How is this treated? Treatment for sinusitis depends on the cause and whether your condition is chronic or acute.  If caused by a virus, your symptoms should go away on their own within 10 days. You may be given medicines to relieve symptoms. They include: ? Medicines that shrink swollen nasal passages (topical intranasal decongestants). ? Medicines that treat allergies (antihistamines). ? A spray that eases inflammation of the nostrils (topical intranasal corticosteroids). ? Rinses that help get rid of thick mucus in your nose (nasal saline washes).  If caused by bacteria, your health care provider may recommend waiting to see if your symptoms improve. Most bacterial infections will get better without antibiotic medicine. You may be given antibiotics if you have: ? A severe infection. ? A weak immune system.  If caused by narrow nasal passages or nasal polyps, you may need to have surgery. Follow these instructions at home: Medicines  Take, use, or apply over-the-counter and prescription medicines only as  told by your health care provider. These may include nasal sprays.  If you were prescribed an antibiotic medicine, take it as told by your health care provider. Do not stop taking the antibiotic even if you start to feel better. Hydrate and humidify  Drink enough fluid to keep your urine pale yellow. Staying hydrated will help to thin your mucus.  Use a cool mist humidifier to keep the humidity level in your home above 50%.  Inhale steam for 10-15 minutes, 3-4 times a day, or as told by your health care provider. You can do this in the bathroom while a hot shower is running.  Limit your exposure to cool or dry air.   Rest  Rest as much as possible.  Sleep with your head raised (elevated).  Make sure you get enough sleep each night. General instructions  Apply a warm, moist washcloth to your face 3-4 times a day or as told by your health care provider. This will help with discomfort.  Wash your hands often with soap and water to reduce your exposure to germs. If soap and water are not available, use hand sanitizer.  Do not smoke. Avoid being around people who are smoking (secondhand smoke).  Keep all follow-up visits as told by your health care provider. This is important.   Contact a health care provider if:  You have a fever.  Your symptoms get worse.  Your symptoms do not improve within 10 days. Get help right away if:  You have a severe headache.  You have persistent vomiting.  You have severe pain or swelling around your face or eyes.  You have vision problems.  You develop confusion.  Your neck is stiff.  You have trouble breathing. Summary  Sinusitis is soreness and inflammation of your sinuses. Sinuses are hollow spaces in the bones around your face.  This condition is caused by nasal tissues that become inflamed or swollen. The swelling traps or blocks the flow of mucus. This allows bacteria, viruses, and fungi to grow, which leads to infection.  If you  were prescribed an antibiotic medicine, take it as told by your health care provider. Do not stop taking the antibiotic even if you start to feel better.  Keep all follow-up visits as told by your health care provider. This is important. This information is not intended to replace advice given to you by your health care provider. Make sure you discuss any questions you have with your health care provider. Document Revised: 01/12/2018 Document Reviewed: 01/12/2018 Elsevier Patient Education  2021 [employer].

## 2020-12-19 ENCOUNTER — Other Ambulatory Visit: Payer: Self-pay

## 2020-12-19 ENCOUNTER — Ambulatory Visit: Payer: Self-pay | Admitting: *Deleted

## 2020-12-19 VITALS — BP 107/74

## 2020-12-19 DIAGNOSIS — G4483 Primary cough headache: Secondary | ICD-10-CM

## 2020-12-19 NOTE — Progress Notes (Signed)
BP check per pt request. Has HA but thinks it is related to cough. Has had productive cough for over a week after sinus pressure resolved but moved into her chest. She is taking Mucinex during the day and Robitussin at night. Provided Tylenol from clinic OTC stock for HA. Denies further needs.

## 2021-01-15 ENCOUNTER — Other Ambulatory Visit: Payer: Self-pay | Admitting: Obstetrics and Gynecology

## 2021-01-15 DIAGNOSIS — R928 Other abnormal and inconclusive findings on diagnostic imaging of breast: Secondary | ICD-10-CM

## 2021-01-17 ENCOUNTER — Ambulatory Visit: Payer: PRIVATE HEALTH INSURANCE

## 2021-01-17 ENCOUNTER — Ambulatory Visit
Admission: RE | Admit: 2021-01-17 | Discharge: 2021-01-17 | Disposition: A | Discharge status: Home / Self Care | Payer: PRIVATE HEALTH INSURANCE | Source: Ambulatory Visit | Attending: Obstetrics and Gynecology | Admitting: Obstetrics and Gynecology

## 2021-01-17 ENCOUNTER — Other Ambulatory Visit: Payer: Self-pay

## 2021-01-17 DIAGNOSIS — R928 Other abnormal and inconclusive findings on diagnostic imaging of breast: Secondary | ICD-10-CM

## 2021-02-13 ENCOUNTER — Other Ambulatory Visit: Payer: Self-pay

## 2021-02-13 ENCOUNTER — Ambulatory Visit: Payer: Self-pay | Admitting: Registered Nurse

## 2021-02-13 VITALS — BP 100/72 | HR 73 | Temp 97.4°F

## 2021-02-13 DIAGNOSIS — S40862A Insect bite (nonvenomous) of left upper arm, initial encounter: Secondary | ICD-10-CM

## 2021-02-13 DIAGNOSIS — L299 Pruritus, unspecified: Secondary | ICD-10-CM

## 2021-02-13 DIAGNOSIS — R12 Heartburn: Secondary | ICD-10-CM

## 2021-02-13 MED ORDER — PREDNISONE 10 MG (21) PO TBPK: ORAL_TABLET | ORAL | 0 refills | Status: DC

## 2021-02-13 MED ORDER — DIPHENHYDRAMINE HCL 25 MG PO TABS: 25.0000 mg | ORAL_TABLET | Freq: Four times a day (QID) | ORAL | 0 refills | Status: DC | PRN

## 2021-02-13 MED ORDER — OMEPRAZOLE 20 MG PO CPDR: 20.0000 mg | DELAYED_RELEASE_CAPSULE | Freq: Every day | ORAL | 0 refills | Status: DC

## 2021-02-13 MED ORDER — IBUPROFEN 800 MG PO TABS: 800.0000 mg | ORAL_TABLET | Freq: Three times a day (TID) | ORAL | 0 refills | Status: AC | PRN

## 2021-02-13 NOTE — Patient Instructions (Signed)
Pruritus Pruritus is an itchy feeling on the skin. One of the most common causes is dry skin, but many different things can cause itching. Most cases of itching do notrequire medical attention. Sometimes itchy skin can turn into a rash. Follow these instructions at home: Skin care  Apply moisturizing lotion to your skin as needed. Lotion that contains petroleum jelly is best. Take medicines or apply medicated creams only as told by your health care provider. This may include: Corticosteroid cream. Anti-itch lotions. Oral antihistamines. Apply a cool, wet cloth (cool compress) to the affected areas. Take baths with one of the following: Epsom salts. You can get these at your local pharmacy or grocery store. Follow the instructions on the packaging. Baking soda. Pour a small amount into the bath as told by your health care provider. Colloidal oatmeal. You can get this at your local pharmacy or grocery store. Follow the instructions on the packaging. Apply baking soda paste to your skin. To make the paste, stir water into a small amount of baking soda until it reaches a paste-like consistency. Do not scratch your skin. Do not take hot showers or baths, which can make itching worse. A cool shower may help with itching as long as you apply moisturizing lotion after the shower. Do not use scented soaps, detergents, perfumes, and cosmetic products. Instead, use gentle, unscented versions of these items.  General instructions Avoid wearing tight clothes. Keep a journal to help find out what is causing your itching. Write down: What you eat and drink. What cosmetic products you use. What soaps or detergents you use. What you wear, including jewelry. Use a humidifier. This keeps the air moist, which helps to prevent dry skin. Be aware of any changes in your itchiness. Contact a health care provider if: The itching does not go away after several days. You are unusually thirsty or urinating more  than normal. Your skin tingles or feels numb. Your skin or the white parts of your eyes turn yellow (jaundice). You feel weak. You have any of the following: Night sweats. Tiredness (fatigue). Weight loss. Abdominal pain. Summary Pruritus is an itchy feeling on the skin. One of the most common causes is dry skin, but many different conditions and factors can cause itching. Apply moisturizing lotion to your skin as needed. Lotion that contains petroleum jelly is best. Take medicines or apply medicated creams only as told by your health care provider. Do not take hot showers or baths. Do not use scented soaps, detergents, perfumes, or cosmetic products. This information is not intended to replace advice given to you by your health care provider. Make sure you discuss any questions you have with your healthcare provider. Document Revised: 08/26/2017 Document Reviewed: 08/26/2017 Elsevier Patient Education  2022 [employer] IT sales professional, Adult An insect bite can make your skin red, itchy, and swollen. An insect bite is different from an insect sting, which happens when an insect injects poison (venom) into the skin. Some insects can spread disease to people through a bite. However, most insectbites do not lead to disease and are not serious. What are the causes? Insects may bite for a variety of reasons, including: Hunger. To defend themselves. Insects that bite include: Spiders. Mosquitoes. Ticks. Fleas. Ants. Flies. Kissing bugs. Chiggers. What are the signs or symptoms? Symptoms of this condition include: Itching or pain in the bite area. Redness and swelling in the bite area. An open wound (skin ulcer). In many cases, symptoms last for 2-4 days. In  rare cases, a person may have a severe allergic reaction (anaphylactic reaction) to a bite. Symptoms of an anaphylactic reaction may include: Feeling warm in the face (flushed). This may include redness. Itchy, red, swollen areas  of skin (hives). Swelling of the eyes, lips, face, mouth, tongue, or throat. Difficulty breathing, speaking, or swallowing. Noisy breathing (wheezing). Dizziness or light-headedness. Fainting. Pain or cramping in the abdomen. Vomiting. Diarrhea. How is this diagnosed? This condition is usually diagnosed based on symptoms and a physical exam. How is this treated? Treatment is usually not needed. Symptoms often go away on their own. When treatment is recommended, it may involve: Applying a cream or lotion to the bite area. This treatment helps with itching. Taking an antibiotic medicine. This treatment is needed if the bite area gets infected. Getting a tetanus shot, if you are not up to date on this vaccine. Applying ice to the affected area. Allergy medicines called antihistamines. This treatment may be needed if you develop itching or an allergic reaction to the insect bite. Giving yourself an epinephrine injection if you have an anaphylactic reaction to a bite. To give the injection, you will use what is commonly called an auto-injector "pen" (pre-filled automatic epinephrine injection device). Your health care provider will teach you how to use an auto-injector pen. Follow these instructions at home: Bite area care  Do not scratch the bite area. Keep the bite area clean and dry. Wash it every day with soap and water as told by your health care provider. Check the bite area every day for signs of infection. Check for: Redness, swelling, or pain. Fluid or blood. Warmth. Pus or a bad smell.  Managing pain, itching, and swelling  You may apply cortisone cream, calamine lotion, or a paste made of baking soda and water to the bite area as told by your health care provider. If directed, put ice on the bite area. Put ice in a plastic bag. Place a towel between your skin and the bag. Leave the ice on for 20 minutes, 2-3 times a day.  General instructions Apply or take  over-the-counter and prescription medicines only as told by your health care provider. If you were prescribed an antibiotic medicine, take or apply it as told by your health care provider. Do not stop using the antibiotic even if your condition improves. Keep all follow-up visits as told by your health care provider. This is important. How is this prevented? To help reduce your risk of insect bites: When you are outdoors, wear clothing that covers your arms and legs. This is especially important in the early morning and evening. Use insect repellent. The best insect repellents contain DEET, picaridin, oil of lemon eucalyptus (OLE), or IR3535. Consider spraying your clothing with a pesticide called permethrin. Permethrin helps prevent insect bites. It works for several weeks and for up to 5-6 clothing washes. Do not apply permethrin directly to the skin. If your home windows do not have screens, consider installing them. If you will be sleeping in an area where there are mosquitoes, consider covering your sleeping area with a mosquito net. Contact a health care provider if: You have redness, swelling, or pain in the bite area. You have fluid or blood coming from the bite area. The bite area feels warm to the touch. You have pus or a bad smell coming from the bite area. You have a fever. Get help right away if: You have joint pain. You have a rash. You feel unusually tired  or sleepy. You have neck pain. You have a headache. You have unusual weakness. You develop symptoms of an anaphylactic reaction. These may include: Flushed skin. Hives. Swelling of the eyes, lips, face, mouth, tongue, or throat. Difficulty breathing, speaking, or swallowing. Wheezing. Dizziness or light-headedness. Fainting. Pain or cramping in the abdomen. Vomiting. Diarrhea. These symptoms may represent a serious problem that is an emergency. Do not wait to see if the symptoms will go away. Do the following right  away: Use the auto-injector pen as you have been instructed. Get medical help. Call your local emergency services (911 in the [place] Do not drive yourself to the hospital. Summary An insect bite can make your skin red, itchy, and swollen. Treatment is usually not needed. Symptoms often go away on their own. When treatment is recommended, it may involve taking medicine, applying medicine to the area, or applying ice. Apply or take over-the-counter and prescription medicines only as told by your health care provider. Use insect repellent to help prevent insect bites. Contact a health care provider if you have any signs of infection in the bite area. This information is not intended to replace advice given to you by your health care provider. Make sure you discuss any questions you have with your healthcare provider. Document Revised: 02/19/2018 Document Reviewed: 02/20/2018 Elsevier Patient Education  2022 [employer].

## 2021-02-13 NOTE — Progress Notes (Signed)
Subjective:    Patient ID: Rebecca Sweeney, female    DOB: 1960-12-29, 60 y.o.   MRN: [id number]  60y/o Caucasian established female pt c/o L forearm itchy rash that she noticed Saturday, 3 days ago was sitting outside on porch didn't see anything bite her or known injury. That night it began swelling and became painful. Saw RN Rolly Salter in clinic yesterday and was given hydrocortisone/aquaphor and oral benadryl UD from clinic stock.  Used hydrocortisone, motrin, benadryl oral and aquaphor on area yesterday and swelling has improved. She states it has a burning sensation in affected area forearm/elbow left but this is improving since starting the creams.  [name] also requested refill omeprazole 20mg  DR po daily and motrin 800mg  po TID prn pain.  Per chart review last [facility] fill omeprazole 11/14/20 #90 RF0 and motrin 09/07/20 #30.  Patient reported omeprazole has resolved heartburn/epigastric pain.  Taking motrin for left arm pain and swelling after insect bite at this time.     Review of Systems  Constitutional:  Negative for activity change, appetite change, chills, diaphoresis, fatigue and fever.  HENT:  Negative for trouble swallowing and voice change.   Eyes:  Negative for photophobia and visual disturbance.  Respiratory:  Negative for cough, choking, shortness of breath, wheezing and stridor.   Cardiovascular:  Negative for chest pain.  Gastrointestinal:  Negative for abdominal distention, abdominal pain, blood in stool, diarrhea, nausea and vomiting.  Endocrine: Negative for cold intolerance and heat intolerance.  Genitourinary:  Negative for difficulty urinating.  Musculoskeletal:  Positive for myalgias. Negative for back pain, gait problem, neck pain and neck stiffness.  Skin:  Positive for color change and rash. Negative for pallor and wound.  Allergic/Immunologic: Positive for environmental allergies. Negative for food allergies.  Neurological:  Negative for dizziness, tremors, seizures,  syncope, facial asymmetry, speech difficulty, weakness, light-headedness, numbness and headaches.  Hematological:  Negative for adenopathy. Does not bruise/bleed easily.  Psychiatric/Behavioral:  Negative for agitation, confusion and sleep disturbance.       Objective:   Physical Exam Vitals and nursing note reviewed.  Constitutional:      General: She is awake. She is not in acute distress.    Appearance: Normal appearance. She is well-developed, well-groomed and normal weight. She is not ill-appearing, toxic-appearing or diaphoretic.  HENT:     Head: Normocephalic and atraumatic.     Jaw: There is normal jaw occlusion.     Salivary Glands: Right salivary gland is not diffusely enlarged. Left salivary gland is not diffusely enlarged.     Right Ear: Hearing and external ear normal.     Left Ear: Hearing and external ear normal.     Nose: Nose normal. No congestion or rhinorrhea.     Mouth/Throat:     Lips: Pink. No lesions.     Mouth: Mucous membranes are moist.     Pharynx: Oropharynx is clear.  Eyes:     General: Lids are normal. Vision grossly intact. Gaze aligned appropriately. Allergic shiner present. No scleral icterus.       Right eye: No discharge.        Left eye: No discharge.     Extraocular Movements: Extraocular movements intact.     Conjunctiva/sclera: Conjunctivae normal.     Pupils: Pupils are equal, round, and reactive to light.  Neck:     Trachea: Trachea and phonation normal. No tracheal deviation.  Cardiovascular:     Rate and Rhythm: Normal rate and regular rhythm.  Pulses:          Radial pulses are 2+ on the right side and 2+ on the left side.  Pulmonary:     Effort: Pulmonary effort is normal.     Breath sounds: Normal breath sounds and air entry. No stridor or transmitted upper airway sounds. No wheezing.     Comments: Spoke full sentences without difficulty; no cough observed in exam room; wearing mask due to covid pandemic Abdominal:     General:  Abdomen is flat. There is no distension.     Palpations: Abdomen is soft.     Tenderness: There is no abdominal tenderness. There is no guarding.  Musculoskeletal:        General: Tenderness present. No swelling or deformity. Normal range of motion.     Right shoulder: No swelling, deformity, effusion or laceration. Normal range of motion.     Left shoulder: No swelling, deformity, effusion or laceration. Normal range of motion.     Right upper arm: No swelling, edema, deformity, lacerations or tenderness.     Left upper arm: No swelling, edema, deformity, lacerations or tenderness.     Right elbow: No swelling, deformity, effusion or lacerations. Normal range of motion. No tenderness.     Left elbow: No swelling, deformity or lacerations. Normal range of motion. Tenderness present. No radial head, medial epicondyle, lateral epicondyle or olecranon process tenderness.     Right forearm: No swelling, edema, deformity, lacerations, tenderness or bony tenderness.     Left forearm: Swelling and tenderness present. No deformity, lacerations or bony tenderness.     Right hand: Normal. No swelling, deformity or lacerations. Normal range of motion. Normal strength.     Left hand: Normal. No swelling, deformity or lacerations. Normal range of motion. Normal strength.       Arms:     Cervical back: Normal range of motion and neck supple. No swelling, edema, deformity, erythema, signs of trauma, lacerations, rigidity, tenderness or crepitus. No pain with movement. Normal range of motion.     Thoracic back: No swelling, edema, deformity, signs of trauma or lacerations.     Lumbar back: No swelling, edema, deformity, signs of trauma or lacerations.     Right lower leg: No edema.     Left lower leg: No edema.     Comments: 0-1/4+ nonpitting edema anterioromedial left forearm; no bony tenderness only soft tissue; macular rash erythema no increased temperature compared to right well demarcated borders and 3  fine 61mm papules adjacent to macular rash  Lymphadenopathy:     Head:     Right side of head: No submandibular or preauricular adenopathy.     Left side of head: No submandibular or preauricular adenopathy.     Cervical: No cervical adenopathy.     Right cervical: No superficial cervical adenopathy.    Left cervical: No superficial cervical adenopathy.  Skin:    General: Skin is warm and dry.     Capillary Refill: Capillary refill takes less than 2 seconds.     Coloration: Skin is not ashen, cyanotic, jaundiced, mottled, pale or sallow.     Findings: Rash present. No abrasion, bruising, burn, erythema, signs of injury, laceration, lesion, petechiae or wound. Rash is macular and papular. Rash is not crusting, nodular, purpuric, pustular, scaling, urticarial or vesicular.     Nails: There is no clubbing.          Comments: Mediolateral left forearm anterior  well demarcated dry 4x7cm; 3 fine  papules 56mm scattered at midline border  Neurological:     General: No focal deficit present.     Mental Status: She is alert and oriented to person, place, and time. Mental status is at baseline.     GCS: GCS eye subscore is 4. GCS verbal subscore is 5. GCS motor subscore is 6.     Cranial Nerves: Cranial nerves are intact. No cranial nerve deficit or facial asymmetry.     Sensory: Sensation is intact. No sensory deficit.     Motor: Motor function is intact. No weakness, tremor, atrophy, abnormal muscle tone or seizure activity.     Coordination: Coordination is intact. Coordination normal.     Gait: Gait is intact. Gait normal.     Comments: In/out of chair without difficulty; gait sure and steady in clinic; bilateral hand grasp equal 5/5  Psychiatric:        Attention and Perception: Attention and perception normal.        Mood and Affect: Mood and affect normal.        Speech: Speech normal.        Behavior: Behavior normal. Behavior is cooperative.        Thought Content: Thought content  normal.        Cognition and Memory: Cognition and memory normal.        Judgment: Judgment normal.          Assessment & Plan:  A-Insect bite left arm initial visit, pruritis, heartburn subsequent visit   Did require oral steroids. Prednisone 10mg  taper po with breakfast (60mg x1day/50/40/30/20/10) #21 RF0 dispensed from [facility] to patient.  Clinic Calagel out of stock may purchase OTC diphenhydramine gel 2% apply BID prn itching.  Clean with soap and water gently daily.  Avoid scratching.  May apply ice 15 minutes po TID prn pain/swelling.  Exitcare handout on pruritis and insect bite printed and given to patient. RTC if worsening erythema, pain, purulent discharge, fever. Apply aquaphor twice a day given UD from clinic.  May continue hydrocortisone 1% prn itching topical BID.  Wash hands before and after application of creams/ointments  Benadryl 25mg  po QID prn itching tolerating well denied drowsiness with use.  Has ice pack at home.  Motrin 800mg  po TID prn pain # 30 RF0 dispensed from [facility] to patient  Patient verbalized understanding, agreed with plan of care and had no further questions at this time.    Refilled omeprazole 20mg  DR po daily #30 RF0 dispensed from [facility] to patient.  Take with food.  Follow up if abdomen/stomach pain reoccurs or if rectal bleeding/vomiting.  Patient verbalized understanding information/instructions, agreed with plan of care and had no further questions at this time.

## 2021-05-08 ENCOUNTER — Other Ambulatory Visit: Payer: Self-pay

## 2021-05-08 ENCOUNTER — Ambulatory Visit: Payer: Self-pay | Admitting: Registered Nurse

## 2021-05-08 ENCOUNTER — Encounter: Payer: Self-pay | Admitting: Registered Nurse

## 2021-05-08 VITALS — Resp 16

## 2021-05-08 DIAGNOSIS — W57XXXA Bitten or stung by nonvenomous insect and other nonvenomous arthropods, initial encounter: Secondary | ICD-10-CM

## 2021-05-08 DIAGNOSIS — S80261A Insect bite (nonvenomous), right knee, initial encounter: Secondary | ICD-10-CM

## 2021-05-08 MED ORDER — DIPHENHYDRAMINE HCL 2 % EX GEL: 1.0000 "application " | Freq: Two times a day (BID) | CUTANEOUS | Status: AC | PRN

## 2021-05-08 MED ORDER — AQUAPHOR EX OINT: TOPICAL_OINTMENT | CUTANEOUS | 0 refills | Status: AC | PRN

## 2021-05-08 NOTE — Patient Instructions (Signed)
Insect Bite, Adult An insect bite can make your skin red, itchy, and swollen. An insect bite is different from an insect sting, which happens when an insect injects poison (venom) into the skin. Some insects can spread disease to people through a bite. However, most insectbites do not lead to disease and are not serious. What are the causes? Insects may bite for a variety of reasons, including: Hunger. To defend themselves. Insects that bite include: Spiders. Mosquitoes. Ticks. Fleas. Ants. Flies. Kissing bugs. Chiggers. What are the signs or symptoms? Symptoms of this condition include: Itching or pain in the bite area. Redness and swelling in the bite area. An open wound (skin ulcer). In many cases, symptoms last for 2-4 days. In rare cases, a person may have a severe allergic reaction (anaphylactic reaction) to a bite. Symptoms of an anaphylactic reaction may include: Feeling warm in the face (flushed). This may include redness. Itchy, red, swollen areas of skin (hives). Swelling of the eyes, lips, face, mouth, tongue, or throat. Difficulty breathing, speaking, or swallowing. Noisy breathing (wheezing). Dizziness or light-headedness. Fainting. Pain or cramping in the abdomen. Vomiting. Diarrhea. How is this diagnosed? This condition is usually diagnosed based on symptoms and a physical exam. How is this treated? Treatment is usually not needed. Symptoms often go away on their own. When treatment is recommended, it may involve: Applying a cream or lotion to the bite area. This treatment helps with itching. Taking an antibiotic medicine. This treatment is needed if the bite area gets infected. Getting a tetanus shot, if you are not up to date on this vaccine. Applying ice to the affected area. Allergy medicines called antihistamines. This treatment may be needed if you develop itching or an allergic reaction to the insect bite. Giving yourself an epinephrine injection if  you have an anaphylactic reaction to a bite. To give the injection, you will use what is commonly called an auto-injector "pen" (pre-filled automatic epinephrine injection device). Your health care provider will teach you how to use an auto-injector pen. Follow these instructions at home: Bite area care  Do not scratch the bite area. Keep the bite area clean and dry. Wash it every day with soap and water as told by your health care provider. Check the bite area every day for signs of infection. Check for: Redness, swelling, or pain. Fluid or blood. Warmth. Pus or a bad smell.  Managing pain, itching, and swelling  You may apply cortisone cream, calamine lotion, or a paste made of baking soda and water to the bite area as told by your health care provider. If directed, put ice on the bite area. Put ice in a plastic bag. Place a towel between your skin and the bag. Leave the ice on for 20 minutes, 2-3 times a day.  General instructions Apply or take over-the-counter and prescription medicines only as told by your health care provider. If you were prescribed an antibiotic medicine, take or apply it as told by your health care provider. Do not stop using the antibiotic even if your condition improves. Keep all follow-up visits as told by your health care provider. This is important. How is this prevented? To help reduce your risk of insect bites: When you are outdoors, wear clothing that covers your arms and legs. This is especially important in the early morning and evening. Use insect repellent. The best insect repellents contain DEET, picaridin, oil of lemon eucalyptus (OLE), or IR3535. Consider spraying your clothing with a pesticide called permethrin.   Permethrin helps prevent insect bites. It works for several weeks and for up to 5-6 clothing washes. Do not apply permethrin directly to the skin. If your home windows do not have screens, consider installing them. If you will be sleeping  in an area where there are mosquitoes, consider covering your sleeping area with a mosquito net. Contact a health care provider if: You have redness, swelling, or pain in the bite area. You have fluid or blood coming from the bite area. The bite area feels warm to the touch. You have pus or a bad smell coming from the bite area. You have a fever. Get help right away if: You have joint pain. You have a rash. You feel unusually tired or sleepy. You have neck pain. You have a headache. You have unusual weakness. You develop symptoms of an anaphylactic reaction. These may include: Flushed skin. Hives. Swelling of the eyes, lips, face, mouth, tongue, or throat. Difficulty breathing, speaking, or swallowing. Wheezing. Dizziness or light-headedness. Fainting. Pain or cramping in the abdomen. Vomiting. Diarrhea. These symptoms may represent a serious problem that is an emergency. Do not wait to see if the symptoms will go away. Do the following right away: Use the auto-injector pen as you have been instructed. Get medical help. Call your local emergency services (911 in the [place] Do not drive yourself to the hospital. Summary An insect bite can make your skin red, itchy, and swollen. Treatment is usually not needed. Symptoms often go away on their own. When treatment is recommended, it may involve taking medicine, applying medicine to the area, or applying ice. Apply or take over-the-counter and prescription medicines only as told by your health care provider. Use insect repellent to help prevent insect bites. Contact a health care provider if you have any signs of infection in the bite area. This information is not intended to replace advice given to you by your health care provider. Make sure you discuss any questions you have with your healthcare provider. Document Revised: 02/19/2018 Document Reviewed: 02/20/2018 Elsevier Patient Education  2022 [employer]  

## 2021-05-08 NOTE — Progress Notes (Signed)
Subjective:    Patient ID: Rebecca Sweeney, female    DOB: 07-07-1961, 60 y.o.   MRN: [id number]  60y/o caucasian established female patient here for evaluation right knee pain/swelling/rash/burning started at work this am applied ice and did not help. Very itchy. Unsure if bit by insect or something else but knee hot to touch swelling worsening as shift progressed.  Has taken benadryl in the past without side effects.  Denied trouble breathing/nausea/vomiting/diarrhea/chest pain/wheezing/shortness of breath.     Review of Systems  Constitutional:  Negative for activity change, appetite change, chills, diaphoresis, fatigue and fever.  HENT:  Negative for facial swelling, trouble swallowing and voice change.   Eyes:  Negative for photophobia and visual disturbance.  Respiratory:  Negative for cough, shortness of breath, wheezing and stridor.   Cardiovascular:  Negative for chest pain and palpitations.  Gastrointestinal:  Negative for diarrhea, nausea and vomiting.  Endocrine: Negative for cold intolerance and heat intolerance.  Genitourinary:  Negative for difficulty urinating.  Musculoskeletal:  Positive for joint swelling. Negative for back pain, gait problem, neck pain and neck stiffness.  Skin:  Positive for color change and rash. Negative for pallor and wound.  Allergic/Immunologic: Positive for environmental allergies. Negative for food allergies.  Neurological:  Negative for dizziness, tremors, seizures, syncope, facial asymmetry, speech difficulty, weakness, light-headedness, numbness and headaches.  Hematological:  Negative for adenopathy. Does not bruise/bleed easily.  Psychiatric/Behavioral:  Negative for agitation, confusion and sleep disturbance.       Objective:   Physical Exam Vitals and nursing note reviewed.  Constitutional:      General: She is awake. She is not in acute distress.    Appearance: Normal appearance. She is well-developed, well-groomed and normal weight.  She is not ill-appearing, toxic-appearing or diaphoretic.  HENT:     Head: Normocephalic and atraumatic.     Jaw: There is normal jaw occlusion.     Salivary Glands: Right salivary gland is not diffusely enlarged. Left salivary gland is not diffusely enlarged.     Right Ear: Hearing and external ear normal.     Left Ear: Hearing and external ear normal.     Nose: Nose normal. No congestion or rhinorrhea.     Mouth/Throat:     Lips: Pink. No lesions.     Mouth: Mucous membranes are moist. No oral lesions or angioedema.     Dentition: No gum lesions.     Tongue: No lesions. Tongue does not deviate from midline.     Palate: No mass and lesions.     Pharynx: Oropharynx is clear. No pharyngeal swelling.  Eyes:     General: Lids are normal. Vision grossly intact. Gaze aligned appropriately. No allergic shiner or scleral icterus.       Right eye: No discharge.        Left eye: No discharge.     Extraocular Movements: Extraocular movements intact.     Conjunctiva/sclera: Conjunctivae normal.     Pupils: Pupils are equal, round, and reactive to light.  Neck:     Trachea: Trachea and phonation normal. No tracheal deviation.  Cardiovascular:     Rate and Rhythm: Normal rate and regular rhythm.     Pulses: Normal pulses.          Radial pulses are 2+ on the right side and 2+ on the left side.  Pulmonary:     Effort: Pulmonary effort is normal. No respiratory distress.     Breath sounds: Normal breath sounds and  air entry. No stridor, decreased air movement or transmitted upper airway sounds. No decreased breath sounds or wheezing.     Comments: Spoke full sentences without difficulty; no cough observed in exam room Abdominal:     General: Abdomen is flat.  Musculoskeletal:        General: Swelling and tenderness present. Normal range of motion.     Cervical back: Normal range of motion and neck supple. No edema, erythema, signs of trauma, rigidity, torticollis or crepitus. No pain with  movement. Normal range of motion.     Right knee: Swelling, effusion and erythema present. No ecchymosis, lacerations, bony tenderness or crepitus. Normal range of motion. Tenderness present. No medial joint line, lateral joint line, MCL, LCL, ACL, PCL or patellar tendon tenderness. Normal alignment and normal patellar mobility.     Left knee: No swelling, deformity, effusion, erythema, ecchymosis, lacerations, bony tenderness or crepitus. Normal range of motion. No tenderness. No medial joint line, lateral joint line, MCL, LCL, ACL, PCL or patellar tendon tenderness. Normal alignment and normal patellar mobility.     Right ankle: No swelling, deformity, ecchymosis or lacerations. No tenderness. Normal range of motion.     Left ankle: No swelling, deformity, ecchymosis or lacerations. No tenderness. Normal range of motion.     Right foot: Normal capillary refill. No swelling, deformity, tenderness or crepitus.     Left foot: Normal capillary refill. No swelling, deformity, tenderness or crepitus.       Legs:     Comments: Prepatellar bursa edema/tenderness right only with overlying macular/papular rash; AROM equal bilateral knees without crepitus normal heel/toe gait  Lymphadenopathy:     Head:     Right side of head: No submandibular or preauricular adenopathy.     Left side of head: No submandibular or preauricular adenopathy.     Cervical: No cervical adenopathy.     Right cervical: No superficial cervical adenopathy.    Left cervical: No superficial cervical adenopathy.  Skin:    General: Skin is warm and dry.     Capillary Refill: Capillary refill takes less than 2 seconds.     Coloration: Skin is not ashen, cyanotic, jaundiced, mottled, pale or sallow.     Findings: Abrasion, erythema and rash present. No abscess, acne, bruising, burn, ecchymosis, signs of injury, laceration, lesion, petechiae or wound. Rash is macular and papular. Rash is not crusting, nodular, purpuric, pustular,  scaling, urticarial or vesicular.     Nails: There is no clubbing.          Comments: After applying calagel in clinic and waiting 5 minutes could see 3 papules 1 lateral patella; and 1 each in satellite lesions; erythema subsiding along with swelling also to 1+/4 and pruritis decreasing  Neurological:     General: No focal deficit present.     Mental Status: She is alert and oriented to person, place, and time. Mental status is at baseline.     GCS: GCS eye subscore is 4. GCS verbal subscore is 5. GCS motor subscore is 6.     Cranial Nerves: Cranial nerves are intact. No cranial nerve deficit, dysarthria or facial asymmetry.     Sensory: Sensation is intact. No sensory deficit.     Motor: Motor function is intact. No weakness, tremor, atrophy, abnormal muscle tone or seizure activity.     Coordination: Coordination is intact. Coordination normal.     Gait: Gait is intact. Gait normal.     Comments: In/out of chair without difficulty;  gait sure and steady in clinic; bilateral hand grasp equal 5/5  Psychiatric:        Attention and Perception: Attention and perception normal.        Mood and Affect: Mood and affect normal.        Speech: Speech normal.        Behavior: Behavior normal. Behavior is cooperative.        Thought Content: Thought content normal.        Cognition and Memory: Cognition and memory normal.        Judgment: Judgment normal.          Assessment & Plan:   A-insect bite right knee initial visit  P-Did not require oral steroids.  Given calagel 2% apply BID prn itching and aquaphor ointment topical BID and after washing with soap and water.  Avoid scratching.  May apply ice 15 minutes po TID prn pain/swelling.  Exitcare handout on insect bite printed and given to patient. RTC if worsening erythema, pain, purulent discharge, fever Avoid steam showers.  Follow up if new or worsening symptoms e.g. red streaks/worsening swelling/pain. May use tylenol 1000mg  po q6h prn  pain/swelling.  Benadryl 25mg  po q6h prn itching or zyrtec 10mg  po BID prn itching. Wash area with soap and water at least daily. Patient verbalized understanding, agreed with plan of care and had no further questions at this time.

## 2021-06-07 ENCOUNTER — Encounter: Payer: Self-pay | Admitting: Registered Nurse

## 2021-06-07 ENCOUNTER — Telehealth: Payer: Self-pay | Admitting: Registered Nurse

## 2021-06-07 DIAGNOSIS — R12 Heartburn: Secondary | ICD-10-CM

## 2021-06-07 DIAGNOSIS — J301 Allergic rhinitis due to pollen: Secondary | ICD-10-CM

## 2021-06-07 MED ORDER — FLUTICASONE PROPIONATE 50 MCG/ACT NA SUSP: 1.0000 | Freq: Two times a day (BID) | NASAL | 1 refills | Status: AC | PRN

## 2021-06-07 MED ORDER — OMEPRAZOLE 20 MG PO CPDR: 20.0000 mg | DELAYED_RELEASE_CAPSULE | Freq: Every day | ORAL | 0 refills | Status: DC

## 2021-06-07 MED ORDER — SALINE SPRAY 0.65 % NA SOLN: 2.0000 | NASAL | 0 refills | Status: DC

## 2021-06-07 NOTE — Telephone Encounter (Signed)
Patient requested refill omeprazole 20mg  DR po daily #90 RF0 from [facility] formulary.  Ran out 4 days ago having epigastric pain/heartburn and having to sit up/propped up for bed. Last fill 02/13/21 per paper chart review.   Refill today and discussed with patient nonfasting magnesium level with RN 02/15/21 in 1 month along with vitamin D level taking 4,000 units po daily OTC.  RN Rolly Salter and patient notified today.  Patient verbalized understanding information/instructions, agreed with plan of care and had no further questions at this time.

## 2021-06-19 ENCOUNTER — Other Ambulatory Visit: Payer: Self-pay | Admitting: Registered Nurse

## 2021-07-03 ENCOUNTER — Ambulatory Visit: Payer: No Typology Code available for payment source | Admitting: Registered Nurse

## 2021-07-03 ENCOUNTER — Encounter: Payer: Self-pay | Admitting: Registered Nurse

## 2021-07-03 ENCOUNTER — Other Ambulatory Visit: Payer: Self-pay

## 2021-07-03 VITALS — Resp 16

## 2021-07-03 DIAGNOSIS — Z0000 Encounter for general adult medical examination without abnormal findings: Secondary | ICD-10-CM

## 2021-07-03 DIAGNOSIS — W010XXA Fall on same level from slipping, tripping and stumbling without subsequent striking against object, initial encounter: Secondary | ICD-10-CM

## 2021-07-03 DIAGNOSIS — M25511 Pain in right shoulder: Secondary | ICD-10-CM

## 2021-07-03 MED ORDER — BIOFREEZE 4 % EX GEL
1.0000 | Freq: Four times a day (QID) | CUTANEOUS | 0 refills | Status: AC | PRN
Start: 2021-07-03 — End: 2021-07-10

## 2021-07-03 NOTE — Patient Instructions (Signed)
Shoulder Exercises Ask your health care provider which exercises are safe for you. Do exercises exactly as told by your health care provider and adjust them as directed. It is normal to feel mild stretching, pulling, tightness, or discomfort as you do these exercises. Stop right away if you feel sudden pain or your pain gets worse. Do not begin these exercises until told by your health care provider. Stretching exercises External rotation and abduction This exercise is sometimes called corner stretch. This exercise rotates your arm outward (external rotation) and moves your arm out from your body (abduction). Stand in a doorway with one of your feet slightly in front of the other. This is called a staggered stance. If you cannot reach your forearms to the door frame, stand facing a corner of a room. Choose one of the following positions as told by your health care provider: Place your hands and forearms on the door frame above your head. Place your hands and forearms on the door frame at the height of your head. Place your hands on the door frame at the height of your elbows. Slowly move your weight onto your front foot until you feel a stretch across your chest and in the front of your shoulders. Keep your head and chest upright and keep your abdominal muscles tight. Hold for ____5-15______ seconds. To release the stretch, shift your weight to your back foot. Repeat _____3_____ times. Complete this exercise ___2_______ times a day. Extension, standing Stand and hold a broomstick, a cane, or a similar object behind your back. Your hands should be a little wider than shoulder width apart. Your palms should face away from your back. Keeping your elbows straight and your shoulder muscles relaxed, move the stick away from your body until you feel a stretch in your shoulders (extension). Avoid shrugging your shoulders while you move the stick. Keep your shoulder blades tucked down toward the middle of  your back. Hold for __5-15________ seconds. Slowly return to the starting position. Repeat ___3_______ times. Complete this exercise _____2_____ times a day. Range-of-motion exercises Pendulum  Stand near a wall or a surface that you can hold onto for balance. Bend at the waist and let your left / right arm hang straight down. Use your other arm to support you. Keep your back straight and do not lock your knees. Relax your left / right arm and shoulder muscles, and move your hips and your trunk so your left / right arm swings freely. Your arm should swing because of the motion of your body, not because you are using your arm or shoulder muscles. Keep moving your hips and trunk so your arm swings in the following directions, as told by your health care provider: Side to side. Forward and backward. In clockwise and counterclockwise circles. Continue each motion for ___5-15_______ seconds, or for as long as told by your health care provider. Slowly return to the starting position. Repeat ___3_______ times. Complete this exercise ____2______ times a day. Shoulder flexion, standing  Stand and hold a broomstick, a cane, or a similar object. Place your hands a little more than shoulder width apart on the object. Your left / right hand should be palm up, and your other hand should be palm down. Keep your elbow straight and your shoulder muscles relaxed. Push the stick up with your healthy arm to raise your left / right arm in front of your body, and then over your head until you feel a stretch in your shoulder (flexion). Avoid  shrugging your shoulder while you raise your arm. Keep your shoulder blade tucked down toward the middle of your back. Hold for __5-15________ seconds. Slowly return to the starting position. Repeat ___3_______ times. Complete this exercise ___2_______ times a day. Shoulder abduction, standing Stand and hold a broomstick, a cane, or a similar object. Place your hands a little  more than shoulder width apart on the object. Your left / right hand should be palm up, and your other hand should be palm down. Keep your elbow straight and your shoulder muscles relaxed. Push the object across your body toward your left / right side. Raise your left / right arm to the side of your body (abduction) until you feel a stretch in your shoulder. Do not raise your arm above shoulder height unless your health care provider tells you to do that. If directed, raise your arm over your head. Avoid shrugging your shoulder while you raise your arm. Keep your shoulder blade tucked down toward the middle of your back. Hold for __5-15________ seconds. Slowly return to the starting position. Repeat ____3______ times. Complete this exercise ____2______ times a day. Internal rotation  Place your left / right hand behind your back, palm up. Use your other hand to dangle an exercise band, a towel, or a similar object over your shoulder. Grasp the band with your left / right hand so you are holding on to both ends. Gently pull up on the band until you feel a stretch in the front of your left / right shoulder. The movement of your arm toward the center of your body is called internal rotation. Avoid shrugging your shoulder while you raise your arm. Keep your shoulder blade tucked down toward the middle of your back. Hold for ___5-15_______ seconds. Release the stretch by letting go of the band and lowering your hands. Repeat ___3_______ times. Complete this exercise ____2______ times a day. Strengthening exercises External rotation  Sit in a stable chair without armrests. Secure an exercise band to a stable object at elbow height on your left / right side. Place a soft object, such as a folded towel or a small pillow, between your left / right upper arm and your body to move your elbow about 4 inches (10 cm) away from your side. Hold the end of the exercise band so it is tight and there is no  slack. Keeping your elbow pressed against the soft object, slowly move your forearm out, away from your abdomen (external rotation). Keep your body steady so only your forearm moves. Hold for ___5-15_______ seconds. Slowly return to the starting position. Repeat ___3_______ times. Complete this exercise ____2______ times a day. Shoulder abduction  Sit in a stable chair without armrests, or stand up. Hold a __none________ weight in your left / right hand, or hold an exercise band with both hands. Start with your arms straight down and your left / right palm facing in, toward your body. Slowly lift your left / right hand out to your side (abduction). Do not lift your hand above shoulder height unless your health care provider tells you that this is safe. Keep your arms straight. Avoid shrugging your shoulder while you do this movement. Keep your shoulder blade tucked down toward the middle of your back. Hold for __5-15________ seconds. Slowly lower your arm, and return to the starting position. Repeat _____3_____ times. Complete this exercise ___2_______ times a day. Shoulder extension Sit in a stable chair without armrests, or stand up. Secure an exercise band  to a stable object in front of you so it is at shoulder height. Hold one end of the exercise band in each hand. Your palms should face each other. Straighten your elbows and lift your hands up to shoulder height. Step back, away from the secured end of the exercise band, until the band is tight and there is no slack. Squeeze your shoulder blades together as you pull your hands down to the sides of your thighs (extension). Stop when your hands are straight down by your sides. Do not let your hands go behind your body. Hold for __5-15________ seconds. Slowly return to the starting position. Repeat ____3______ times. Complete this exercise _____2_____ times a day. Shoulder row Sit in a stable chair without armrests, or stand up. Secure  an exercise band to a stable object in front of you so it is at waist height. Hold one end of the exercise band in each hand. Position your palms so that your thumbs are facing the ceiling (neutral position). Bend each of your elbows to a 90-degree angle (right angle) and keep your upper arms at your sides. Step back until the band is tight and there is no slack. Slowly pull your elbows back behind you. Hold for _5-15_________ seconds. Slowly return to the starting position. Repeat ______3____ times. Complete this exercise _____2_____ times a day. Shoulder press-ups  Sit in a stable chair that has armrests. Sit upright, with your feet flat on the floor. Put your hands on the armrests so your elbows are bent and your fingers are pointing forward. Your hands should be about even with the sides of your body. Push down on the armrests and use your arms to lift yourself off the chair. Straighten your elbows and lift yourself up as much as you comfortably can. Move your shoulder blades down, and avoid letting your shoulders move up toward your ears. Keep your feet on the ground. As you get stronger, your feet should support less of your body weight as you lift yourself up. Hold for ___5-15_______ seconds. Slowly lower yourself back into the chair. Repeat _____3_____ times. Complete this exercise ____2______ times a day. Wall push-ups  Stand so you are facing a stable wall. Your feet should be about one arm-length away from the wall. Lean forward and place your palms on the wall at shoulder height. Keep your feet flat on the floor as you bend your elbows and lean forward toward the wall. Hold for ____5-15______ seconds. Straighten your elbows to push yourself back to the starting position. Repeat ____3______ times. Complete this exercise _____2_____ times a day. This information is not intended to replace advice given to you by your health care provider. Make sure you discuss any questions you  have with your health care provider. Document Revised: 12/04/2018 Document Reviewed: 09/11/2018 [employer] Patient Education  2022 [employer] Shoulder Sprain A shoulder sprain is a partial or complete tear in one of the tough, fiber-like tissues (ligaments) in the shoulder. The ligaments in the shoulder help to hold the shoulder in place. What are the causes? This condition may be caused by: A fall. A hit to the shoulder. A twist of the arm. What increases the risk? You are more likely to develop this condition if you: Play sports. Have problems with balance or coordination. What are the signs or symptoms? Symptoms of this condition include: Pain when moving the shoulder. Limited ability to move the shoulder. Swelling and tenderness on top of the shoulder. Warmth in the shoulder.  A change in the shape of the shoulder. Redness or bruising on the shoulder. How is this diagnosed? This condition is diagnosed with: A physical exam. During the exam, you may be asked to do simple exercises with your shoulder. Imaging tests such as X-rays, MRI, or a CT scan. These tests can show how severe the sprain is. How is this treated? This condition may be treated with: Rest. Pain medicine. Ice. A sling or brace. This is used to keep the arm still while the shoulder is healing. Physical therapy or rehabilitation exercises. These help to improve the range of motion and strength of the shoulder. Surgery (rare). Surgery may be needed if the sprain caused a joint to become unstable. Surgery may also be needed to reduce pain. Some people may develop ongoing shoulder pain or lose some range of motion in the shoulder. However, most people do not develop long-term problems. Follow these instructions at home: If you have a sling or brace: Wear the sling or brace as told by your health care provider. Remove it only as told by your health care provider. Loosen the sling or brace if your fingers tingle,  become numb, or turn cold and blue. Keep the sling or brace clean. If the sling or brace is not waterproof: Do not let it get wet. Cover it with a watertight covering when you take a bath or shower. Activity Rest your shoulder. Move your arm only as much as told by your health care provider, but move your hand and fingers often to prevent stiffness and swelling. Return to your normal activities as told by your health care provider. Ask your health care provider what activities are safe for you. Ask your health care provider when it is safe for you to drive if you have a sling or brace on your shoulder. If you were shown how to do any exercises, do them as told by your health care provider. General instructions If directed, put ice on the affected area. Put ice in a plastic bag. Place a towel between your skin and the bag. Leave the ice on for 20 minutes, 2-3 times a day. Take over-the-counter and prescription medicines only as told by your health care provider. Do not use any products that contain nicotine or tobacco, such as cigarettes, e-cigarettes, and chewing tobacco. These can delay healing. If you need help quitting, ask your health care provider. Keep all follow-up visits as told by your health care provider. This is important. Contact a health care provider if: Your pain gets worse. Your pain is not relieved with medicines. You have increased redness or swelling. Get help right away if: You have a fever. You cannot move your arm or shoulder. You develop severe numbness or tingling in your arm, hand, or fingers. Your arm, hand, or fingers feel cold and turn blue, white, or gray. Summary A shoulder sprain is a partial or complete tear in one of the tough, fiber-like tissues (ligaments) in the shoulder. This condition may be caused by a fall, a hit to the shoulder, or a twist of the arm. Treatment usually includes rest, ice, and pain medicine as needed. If you have a sling or  brace, wear it as told by your health care provider. Remove it only as told by your health care provider. This information is not intended to replace advice given to you by your health care provider. Make sure you discuss any questions you have with your health care provider. Document Revised: 01/16/2018 Document  Reviewed: 01/16/2018 [employer] Patient Education  2022 [employer] Muscle Strain A muscle strain is an injury that occurs when a muscle is stretched beyond its normal length. Usually, a small number of muscle fibers are torn when this happens. There are three types of muscle strains. First-degree strains have the least amount of muscle fiber tearing and the least amount of pain. Second-degree and third-degree strains have more tearing and pain. Usually, recovery from muscle strain takes 1-2 weeks. Complete healing normally takes 5-6 weeks. What are the causes? This condition is caused when a sudden, violent force is placed on a muscle and stretches it too far. This may occur with a fall, while lifting, or during sports. What increases the risk? This condition is more likely to develop in athletes and people who are physically active. What are the signs or symptoms? Symptoms of this condition include: Pain. Tenderness. Bruising. Swelling. Trouble using the muscle. How is this diagnosed? This condition is diagnosed based on a physical exam and your medical history. Tests may also be done, including an X-ray, ultrasound, or MRI. How is this treated? This condition is initially treated with PRICE therapy. This therapy involves: Protecting the muscle from being injured again. Resting the injured muscle. Icing the injured muscle. Applying pressure (compression) to the injured muscle. This may be done with a splint or elastic bandage. Raising (elevating) the injured muscle. Your health care provider may also recommend medicine for pain. Follow these instructions at home: If you have  a removable splint: Wear the splint as told by your health care provider. Remove it only as told by your health care provider. Check the skin around the splint every day. Tell your health care provider about any concerns. Loosen the splint if your fingers or toes tingle, become numb, or turn cold and blue. Keep the splint clean. If the splint is not waterproof: Do not let it get wet. Cover it with a watertight covering when you take a bath or a shower. Managing pain, stiffness, and swelling  If directed, put ice on the injured area. To do this: If you have a removable splint, remove it as told by your health care provider. Put ice in a plastic bag. Place a towel between your skin and the bag. Leave the ice on for 20 minutes, 2-3 times a day. Remove the ice if your skin turns bright red. This is very important. If you cannot feel pain, heat, or cold, you have a greater risk of damage to the area. Move your fingers or toes often to reduce stiffness and swelling. Raise (elevate) the injured area above the level of your heart while you are sitting or lying down. Wear an elastic bandage as told by your health care provider. Make sure that it is not too tight. General instructions Take over-the-counter and prescription medicines only as told by your health care provider. Treatment may include muscle relaxants or medicines for pain and inflammation that are taken by mouth or applied to the skin. Restrict your activity and rest the injured muscle as told by your health care provider. Gentle movements may be allowed. If physical therapy was prescribed, do exercises as told by your health care provider. Do not put pressure on any part of the splint until it is fully hardened. This may take several hours. Do not use any products that contain nicotine or tobacco. These products include cigarettes, chewing tobacco, and vaping devices, such as e-cigarettes. If you need help quitting, ask your health care  provider. Ask your health care provider when it is safe to drive if you have a splint. Keep all follow-up visits. This is important. How is this prevented? Warm up before exercising. This helps to prevent future muscle strains. Contact a health care provider if: You have more pain or swelling in the injured area. Get help right away if: You have numbness or tingling in the injured area. You lose a lot of strength in the injured area. Summary A muscle strain is an injury that occurs when a muscle is stretched beyond its normal length. This condition is caused when a sudden, violent force is placed on a muscle and stretches it too far. This condition is initially treated with PRICE therapy, which involves protecting, resting, icing, compressing, and elevating. Gentle movements may be allowed. If physical therapy was prescribed, do exercises as told by your health care provider. This information is not intended to replace advice given to you by your health care provider. Make sure you discuss any questions you have with your health care provider. Document Revised: 10/30/2020 Document Reviewed: 10/30/2020 [employer] Patient Education  2022 [employer].

## 2021-07-03 NOTE — Progress Notes (Signed)
Subjective:    Patient ID: Rebecca Sweeney, female    DOB: 16-Jun-1961, 60 y.o.   MRN: [id number]  Patient fall at home this weekend right outstretched hand and knees.  Right shoulder pain denied bruise/swelling/weakness/tingling/numbness but having pain with working/lifting product  Right hand dominant.  Knees are bruised.  Tylenol prn not helping shoulder.  Would like to get annual labs.  Did not do 2022 Be Well due to smoking and only had primary covid vaccines does not want booster.  Last labs 2021.  Concerned she has been sick more than usual this past year.    Review of Systems  Constitutional:  Negative for activity change, appetite change, chills, diaphoresis, fatigue, fever and unexpected weight change.  HENT:  Negative for trouble swallowing and voice change.   Eyes:  Negative for photophobia and visual disturbance.  Respiratory:  Negative for cough, choking, shortness of breath, wheezing and stridor.   Cardiovascular:  Negative for chest pain and leg swelling.  Gastrointestinal:  Negative for diarrhea, nausea and vomiting.  Endocrine: Negative for cold intolerance and heat intolerance.  Genitourinary:  Negative for difficulty urinating.  Musculoskeletal:  Positive for arthralgias and myalgias. Negative for back pain, gait problem, joint swelling, neck pain and neck stiffness.  Skin:  Positive for color change. Negative for rash.  Allergic/Immunologic: Positive for environmental allergies. Negative for food allergies.  Neurological:  Negative for dizziness, tremors, seizures, syncope, facial asymmetry, speech difficulty, weakness, light-headedness, numbness and headaches.  Hematological:  Negative for adenopathy. Does not bruise/bleed easily.  Psychiatric/Behavioral:  Negative for agitation, confusion and sleep disturbance.       Objective:   Physical Exam Vitals and nursing note reviewed.  Constitutional:      General: She is awake. She is not in acute distress.    Appearance:  Normal appearance. She is well-developed, well-groomed and normal weight. She is not ill-appearing, toxic-appearing or diaphoretic.  HENT:     Head: Normocephalic and atraumatic.     Jaw: There is normal jaw occlusion.     Salivary Glands: Right salivary gland is not diffusely enlarged. Left salivary gland is not diffusely enlarged.     Right Ear: Hearing and external ear normal.     Left Ear: Hearing and external ear normal.     Nose: Nose normal. No congestion or rhinorrhea.     Mouth/Throat:     Lips: Pink. No lesions.     Mouth: Mucous membranes are moist. No oral lesions or angioedema.     Dentition: No dental abscesses or gum lesions.     Tongue: No lesions. Tongue does not deviate from midline.     Palate: No mass and lesions.     Pharynx: Oropharynx is clear. Uvula midline. No uvula swelling.  Eyes:     General: Lids are normal. Vision grossly intact. Gaze aligned appropriately. Allergic shiner present. No scleral icterus.       Right eye: No discharge.        Left eye: No discharge.     Extraocular Movements: Extraocular movements intact.     Conjunctiva/sclera: Conjunctivae normal.     Pupils: Pupils are equal, round, and reactive to light.  Neck:     Thyroid: No thyromegaly.     Trachea: Trachea and phonation normal. No tracheal deviation.  Cardiovascular:     Rate and Rhythm: Normal rate and regular rhythm.     Pulses: Normal pulses.          Radial pulses are 2+  on the right side and 2+ on the left side.  Pulmonary:     Effort: Pulmonary effort is normal. No respiratory distress.     Breath sounds: Normal breath sounds and air entry. No stridor, decreased air movement or transmitted upper airway sounds. No decreased breath sounds, wheezing or rales.     Comments: Spoke full sentences without difficulty; no cough observed in exam room Abdominal:     General: Abdomen is flat.  Musculoskeletal:        General: Tenderness and signs of injury present. No swelling or  deformity.     Right shoulder: Tenderness present. No swelling, deformity, effusion, laceration, bony tenderness or crepitus. Decreased range of motion. Normal strength. Normal pulse.     Left shoulder: No swelling, deformity, effusion, laceration, tenderness, bony tenderness or crepitus. Normal range of motion. Normal strength. Normal pulse.     Right upper arm: No swelling, edema, deformity, lacerations, tenderness or bony tenderness.     Left upper arm: No swelling, edema, deformity, lacerations, tenderness or bony tenderness.     Right elbow: No swelling, deformity, effusion or lacerations. Normal range of motion. No tenderness.     Left elbow: No swelling, deformity, effusion or lacerations. Normal range of motion. No tenderness.     Right forearm: No swelling, edema, deformity, lacerations, tenderness or bony tenderness.     Left forearm: No swelling, edema, deformity, lacerations, tenderness or bony tenderness.     Right wrist: No swelling, deformity, effusion, lacerations or crepitus. Normal range of motion.     Left wrist: No swelling, deformity, effusion, lacerations or crepitus. Normal range of motion.     Right hand: No swelling, deformity or lacerations. Normal range of motion. Normal strength. Normal capillary refill.     Left hand: No swelling, deformity or lacerations. Normal range of motion. Normal strength. Normal capillary refill.       Arms:     Cervical back: Normal range of motion and neck supple. No swelling, edema, deformity, erythema, signs of trauma, lacerations, rigidity, spasms or crepitus. No pain with movement. Normal range of motion.     Thoracic back: No swelling, edema, deformity, signs of trauma, lacerations or spasms. Normal range of motion.     Comments: Right central deltoid TTP no ecchymosis/abrasion/erythema/edema/palpable defect but TTP mildly; deltoid pain with right shoulder abduction/adduction/flexion  Lymphadenopathy:     Head:     Right side of head:  No submandibular or preauricular adenopathy.     Left side of head: No submandibular or preauricular adenopathy.     Cervical: No cervical adenopathy.     Right cervical: No superficial cervical adenopathy.    Left cervical: No superficial cervical adenopathy.  Skin:    General: Skin is warm and dry.     Capillary Refill: Capillary refill takes less than 2 seconds.     Coloration: Skin is not ashen, cyanotic, jaundiced, mottled, pale or sallow.     Findings: Bruising and ecchymosis present. No abrasion, burn, erythema, signs of injury, laceration, lesion, petechiae, rash or wound.     Nails: There is no clubbing.       Neurological:     General: No focal deficit present.     Mental Status: She is alert and oriented to person, place, and time. Mental status is at baseline.     GCS: GCS eye subscore is 4. GCS verbal subscore is 5. GCS motor subscore is 6.     Cranial Nerves: Cranial nerves 2-12 are  intact. No cranial nerve deficit, dysarthria or facial asymmetry.     Sensory: Sensation is intact. No sensory deficit.     Motor: Motor function is intact. No weakness, tremor, atrophy, abnormal muscle tone or seizure activity.     Coordination: Coordination is intact. Coordination normal.     Gait: Gait is intact. Gait normal.     Comments: gait sure and steady in clinic; bilateral hand grasp equal 5/5  Psychiatric:        Attention and Perception: Attention and perception normal.        Mood and Affect: Mood and affect normal.        Speech: Speech normal.        Behavior: Behavior normal. Behavior is cooperative.        Thought Content: Thought content normal.        Cognition and Memory: Cognition and memory normal.        Judgment: Judgment normal.          Assessment & Plan:  A-shoulder pain acute right initial visit, fall from slip/trip or stumble initial, preventive health exam  P-tylenol 1000mg  po QID prn pain.  Biofreeze gel apply QId prn pain.  Applied thermacare patch  from clinic stock trial.  If helps return to clinic to obtain another patch on next workday.  Rest when at home.  If needs work restrictions will need to follow up with PCM as I am not able to write work restrictions in this clinic. Home stretches demonstrated to patient-e.g. Arm pendulums/circles, spiders walking up wall, chest stretches, neck AROM, and isometric shoulder exercises. Consider physical therapy referral if no improvement with prescribed home therapy. Ensure ergonomics correct desk at work  avoid repetitive motions if possible/holding phone/laptop  in hand use desk/stand. Patient was instructed to rest, ice, and ROM exercises. Activity as tolerated. Follow up in 2 weeks for re-evaluation sooner if symptoms worsen. Exitcare handout on muscle/shoulder strain and rehab exercises printed and given to patient. Discussed with patient deltoid muscle strained.  Patient verbalized agreement and understanding of treatment plan and had no further questions at this time.   P2: [employer].   Patient on PPI magnesium level, vitamin D, female exec panel and Hgba1c fasting ordered.  She will schedule lab draw with RN .  Consider smoking cessation.  Continue 150 minutes exercise per week and healthy dietary choices to keep added sugars less than 25 grams and fiber 20 grams per day or more.  Consider covid bivalent booster vaccine.  Patient reported has received flu vaccine.  Patient verbalized understanding information and had no further questions at this time.

## 2021-07-05 ENCOUNTER — Other Ambulatory Visit: Payer: Self-pay

## 2021-07-05 ENCOUNTER — Ambulatory Visit: Payer: Self-pay | Admitting: *Deleted

## 2021-07-05 DIAGNOSIS — Z0000 Encounter for general adult medical examination without abnormal findings: Secondary | ICD-10-CM

## 2021-07-05 NOTE — Progress Notes (Signed)
Fasting labs per orders.

## 2021-07-06 LAB — CMP12+LP+TP+TSH+6AC+CBC/D/PLT
ALT: 18 IU/L (ref 0–32)
AST: 13 IU/L (ref 0–40)
Albumin/Globulin Ratio: 2.4 — ABNORMAL HIGH (ref 1.2–2.2)
Albumin: 4.8 g/dL (ref 3.8–4.9)
Alkaline Phosphatase: 47 IU/L (ref 44–121)
BUN/Creatinine Ratio: 11 (ref 9–23)
BUN: 8 mg/dL (ref 6–24)
Basophils Absolute: 0 10*3/uL (ref 0.0–0.2)
Basos: 1 %
Bilirubin Total: 0.4 mg/dL (ref 0.0–1.2)
Calcium: 9.9 mg/dL (ref 8.7–10.2)
Chloride: 102 mmol/L (ref 96–106)
Chol/HDL Ratio: 5.9 ratio — ABNORMAL HIGH (ref 0.0–4.4)
Cholesterol, Total: 348 mg/dL — ABNORMAL HIGH (ref 100–199)
Creatinine, Ser: 0.71 mg/dL (ref 0.57–1.00)
EOS (ABSOLUTE): 0.1 10*3/uL (ref 0.0–0.4)
Eos: 2 %
Estimated CHD Risk: 1.7 times avg. — ABNORMAL HIGH (ref 0.0–1.0)
Free Thyroxine Index: 2.3 (ref 1.2–4.9)
GGT: 10 IU/L (ref 0–60)
Globulin, Total: 2 g/dL (ref 1.5–4.5)
Glucose: 92 mg/dL (ref 70–99)
HDL: 59 mg/dL (ref >39)
Hematocrit: 39.5 % (ref 34.0–46.6)
Hemoglobin: 13.6 g/dL (ref 11.1–15.9)
Immature Grans (Abs): 0 10*3/uL (ref 0.0–0.1)
Immature Granulocytes: 0 %
Iron: 84 ug/dL (ref 27–159)
LDH: 166 IU/L (ref 119–226)
LDL Chol Calc (NIH): 258 mg/dL — ABNORMAL HIGH (ref 0–99)
Lymphocytes Absolute: 2.8 10*3/uL (ref 0.7–3.1)
Lymphs: 34 %
MCH: 32.9 pg (ref 26.6–33.0)
MCHC: 34.4 g/dL (ref 31.5–35.7)
MCV: 96 fL (ref 79–97)
Monocytes Absolute: 0.4 10*3/uL (ref 0.1–0.9)
Monocytes: 5 %
Neutrophils Absolute: 4.8 10*3/uL (ref 1.4–7.0)
Neutrophils: 58 %
Phosphorus: 3.6 mg/dL (ref 3.0–4.3)
Platelets: 387 10*3/uL (ref 150–450)
Potassium: 4.1 mmol/L (ref 3.5–5.2)
RBC: 4.13 x10E6/uL (ref 3.77–5.28)
RDW: 11.7 % (ref 11.7–15.4)
Sodium: 138 mmol/L (ref 134–144)
T3 Uptake Ratio: 26 % (ref 24–39)
T4, Total: 9 ug/dL (ref 4.5–12.0)
TSH: 0.891 u[IU]/mL (ref 0.450–4.500)
Total Protein: 6.8 g/dL (ref 6.0–8.5)
Triglycerides: 162 mg/dL — ABNORMAL HIGH (ref 0–149)
Uric Acid: 2.9 mg/dL — ABNORMAL LOW (ref 3.0–7.2)
VLDL Cholesterol Cal: 31 mg/dL (ref 5–40)
WBC: 8.2 10*3/uL (ref 3.4–10.8)
eGFR: 98 mL/min/{1.73_m2} (ref >59)

## 2021-07-06 LAB — HEMOGLOBIN A1C
Est. average glucose Bld gHb Est-mCnc: 111 mg/dL
Hgb A1c MFr Bld: 5.5 % (ref 4.8–5.6)

## 2021-07-06 LAB — MAGNESIUM: Magnesium: 2.1 mg/dL (ref 1.6–2.3)

## 2021-07-06 LAB — VITAMIN D 25 HYDROXY (VIT D DEFICIENCY, FRACTURES): Vit D, 25-Hydroxy: 20 ng/mL — ABNORMAL LOW (ref 30.0–100.0)

## 2021-07-06 NOTE — Progress Notes (Signed)
Results reviewed with pt in clinic prior to NP notes available. Hard copy provided. Pt is concerned with her increasing cholesterol. Reviewed high/mod/low cholesterol foods with her. Mother had high cholesterol as well so explained there is a likelihood of genetics playing into levels as well. Handouts provided.  She is agreeable to restarting high dose Vit D. [facility] for preferred pharmacy.

## 2021-07-07 ENCOUNTER — Telehealth: Payer: Self-pay | Admitting: Registered Nurse

## 2021-07-07 DIAGNOSIS — E559 Vitamin D deficiency, unspecified: Secondary | ICD-10-CM

## 2021-07-07 MED ORDER — CHOLECALCIFEROL 1.25 MG (50000 UT) PO TABS: 50000.0000 [IU] | ORAL_TABLET | ORAL | 0 refills | Status: DC

## 2021-07-07 NOTE — Telephone Encounter (Signed)
Annual labs completed low vitamin D goal 30-60 was currently taking 2,000 units.  Will increase to 50,000 weekly and retest in 3-6 months.  Electronic Rx sent to patient pharmacy of choice #12 [id number].

## 2021-07-24 ENCOUNTER — Ambulatory Visit: Payer: Self-pay | Admitting: *Deleted

## 2021-07-24 ENCOUNTER — Other Ambulatory Visit: Payer: Self-pay

## 2021-07-24 VITALS — BP 116/79 | HR 68 | Wt 146.0 lb

## 2021-07-24 DIAGNOSIS — G44209 Tension-type headache, unspecified, not intractable: Secondary | ICD-10-CM

## 2021-07-24 NOTE — Progress Notes (Signed)
Routine BP check per pt request. HA present.

## 2021-08-16 ENCOUNTER — Other Ambulatory Visit: Payer: Self-pay

## 2021-08-16 ENCOUNTER — Ambulatory Visit: Payer: Self-pay | Admitting: Registered Nurse

## 2021-08-16 ENCOUNTER — Encounter: Payer: Self-pay | Admitting: Registered Nurse

## 2021-08-16 VITALS — BP 114/76 | HR 80

## 2021-08-16 DIAGNOSIS — H8110 Benign paroxysmal vertigo, unspecified ear: Secondary | ICD-10-CM

## 2021-08-16 DIAGNOSIS — J00 Acute nasopharyngitis [common cold]: Secondary | ICD-10-CM

## 2021-08-16 DIAGNOSIS — G44209 Tension-type headache, unspecified, not intractable: Secondary | ICD-10-CM

## 2021-08-16 DIAGNOSIS — F17210 Nicotine dependence, cigarettes, uncomplicated: Secondary | ICD-10-CM

## 2021-08-16 DIAGNOSIS — N951 Menopausal and female climacteric states: Secondary | ICD-10-CM | POA: Insufficient documentation

## 2021-08-16 DIAGNOSIS — H6983 Other specified disorders of Eustachian tube, bilateral: Secondary | ICD-10-CM

## 2021-08-16 NOTE — Progress Notes (Addendum)
1415 patient reported home covid test negative.  Discussed if new or worsening URI symptoms to repeat test in 48 hours and to notify clinic staff if positive.  Community positive test rate 13% this week and rising.   Patient verbalized understanding information/instructions, agreed with plan of care and had no further questions at this time.  Subjective:    Patient ID: Rebecca Sweeney, female    DOB: 02-06-1961, 60 y.o.   MRN: [id number]  60y/o caucasian established female here for evaluation dizziness.  Patient reported taking meclizine bid prn but still causing drowsiness cannot increase to every 6 hours and still work.  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  She caught herself on work table yesterday before falling.  Reported last week while dusting fell at home Has been avoiding any activities at home since then that require head down position.  Some rhinitis this past week. Using her nasal saline, allergy pills, red cold pills and flonase nasal. Has not had her blood pressure checked at home or in clinic until appt today.  Denied any illness "just my allergies"  Denied worst headache of her life/visual changes/chest pain or shortness of breath.  "I am concerned that maybe a problem with my blood due to my high cholesterol.  I started garlic and lemon water 3 weeks ago to help with my cholesterol.  Patient [facility].  Has not taken home covid test with rhinitis.  Denied arm/leg weakness, aphasia, dysphagia, or dysphasia.  Patient still smoking.  Contemplating quitting.     Review of Systems  Constitutional:  Negative for activity change, appetite change, chills, diaphoresis, fatigue and fever.  HENT:  Positive for postnasal drip and rhinorrhea. Negative for trouble swallowing and voice change.   Eyes:  Negative for photophobia and visual disturbance.  Respiratory:  Negative for cough, shortness of breath, wheezing and stridor.   Cardiovascular:   Negative for chest pain.  Gastrointestinal:  Negative for diarrhea, nausea and vomiting.  Endocrine: Negative for cold intolerance and heat intolerance.  Genitourinary:  Negative for difficulty urinating.  Musculoskeletal:  Negative for back pain, gait problem, myalgias, neck pain and neck stiffness.  Skin:  Negative for color change, rash and wound.  Allergic/Immunologic: Positive for environmental allergies. Negative for food allergies.  Neurological:  Positive for dizziness and headaches. Negative for tremors, seizures, syncope, facial asymmetry, speech difficulty, weakness, light-headedness and numbness.  Hematological:  Negative for adenopathy. Does not bruise/bleed easily.  Psychiatric/Behavioral:  Negative for agitation, confusion and sleep disturbance.       Objective:   Physical Exam Vitals and nursing note reviewed.  Constitutional:      General: She is awake. She is not in acute distress.    Appearance: Normal appearance. She is well-developed, well-groomed and normal weight. She is not ill-appearing, toxic-appearing or diaphoretic.  HENT:     Head: Normocephalic and atraumatic.     Jaw: There is normal jaw occlusion.     Salivary Glands: Right salivary gland is not diffusely enlarged. Left salivary gland is not diffusely enlarged.     Right Ear: Hearing, ear canal and external ear normal. A middle ear effusion is present. There is no impacted cerumen.     Left Ear: Hearing, ear canal and external ear normal. A middle ear effusion is present. There is no impacted cerumen.     Nose: Nose normal. No congestion or rhinorrhea.     Right Turbinates: Enlarged and swollen. Not  pale.     Left Turbinates: Enlarged and swollen. Not pale.     Right Sinus: No maxillary sinus tenderness or frontal sinus tenderness.     Left Sinus: No maxillary sinus tenderness or frontal sinus tenderness.     Mouth/Throat:     Lips: Pink. No lesions.     Mouth: Mucous membranes are moist. No oral lesions  or angioedema.     Dentition: Abnormal dentition. Has dentures.     Tongue: No lesions. Tongue does not deviate from midline.     Palate: No mass and lesions.     Pharynx: Oropharynx is clear. Uvula midline. No pharyngeal swelling, oropharyngeal exudate, posterior oropharyngeal erythema or uvula swelling.     Tonsils: No tonsillar exudate or tonsillar abscesses. 0 on the right. 0 on the left.     Comments: Upper denture plate partial; cobblestoning posterior pharynx; bilateral TMs air fluid level with 30% opacity noted centrally; bilateral allergic shiners and lower eyelid swelling 1+/4 nonpitting Eyes:     General: Lids are normal. Vision grossly intact. Gaze aligned appropriately. Allergic shiner present. No scleral icterus.       Right eye: No discharge.        Left eye: No discharge.     Extraocular Movements: Extraocular movements intact.     Right eye: Normal extraocular motion and no nystagmus.     Left eye: Normal extraocular motion and no nystagmus.     Conjunctiva/sclera: Conjunctivae normal.     Pupils: Pupils are equal, round, and reactive to light.     Right eye: Pupil is round and reactive.     Left eye: Pupil is round and reactive.  Neck:     Vascular: No carotid bruit.     Trachea: Trachea and phonation normal. No tracheal deviation.      Comments: Cervial AROM at patient baseline decreased flexion/extension; unable to touch chin to chest Cardiovascular:     Rate and Rhythm: Normal rate and regular rhythm.     Pulses:          Radial pulses are 2+ on the right side and 2+ on the left side.     Heart sounds: Normal heart sounds, S1 normal and S2 normal. No murmur heard.   No friction rub. No gallop.  Pulmonary:     Effort: Pulmonary effort is normal. No respiratory distress.     Breath sounds: Normal breath sounds and air entry. No stridor, decreased air movement or transmitted upper airway sounds. No decreased breath sounds, wheezing, rhonchi or rales.     Comments:  Spoke full sentences without difficulty; no cough observed in exam room Abdominal:     General: Abdomen is flat.     Palpations: Abdomen is soft.  Musculoskeletal:        General: Tenderness present. No swelling, deformity or signs of injury. Normal range of motion.     Right elbow: No swelling, deformity, effusion or lacerations. Normal range of motion.     Left elbow: No swelling, deformity, effusion or lacerations. Normal range of motion.     Right hand: No swelling, deformity, lacerations or tenderness. Normal range of motion. Normal strength.     Left hand: No swelling, deformity, lacerations or tenderness. Normal range of motion. Normal strength.     Cervical back: Normal range of motion and neck supple. Tenderness present. No swelling, edema, deformity, erythema, signs of trauma, lacerations, rigidity, spasms, torticollis, bony tenderness or crepitus. Muscular tenderness present. No pain with movement  or spinous process tenderness. Normal range of motion.     Thoracic back: No swelling, edema, deformity, signs of trauma or lacerations. Normal range of motion.     Right lower leg: No edema.     Left lower leg: No edema.  Lymphadenopathy:     Head:     Right side of head: No submental, submandibular, tonsillar, preauricular, posterior auricular or occipital adenopathy.     Left side of head: No submental, submandibular, tonsillar, preauricular, posterior auricular or occipital adenopathy.     Cervical: No cervical adenopathy.     Right cervical: No superficial, deep or posterior cervical adenopathy.    Left cervical: No superficial, deep or posterior cervical adenopathy.  Skin:    General: Skin is warm and dry.     Capillary Refill: Capillary refill takes less than 2 seconds.     Coloration: Skin is not ashen, cyanotic, jaundiced, mottled, pale or sallow.     Findings: No abrasion, abscess, acne, bruising, burn, ecchymosis, erythema, signs of injury, laceration, lesion, petechiae, rash  or wound.     Nails: There is no clubbing.  Neurological:     General: No focal deficit present.     Mental Status: She is alert and oriented to person, place, and time. Mental status is at baseline.     GCS: GCS eye subscore is 4. GCS verbal subscore is 5. GCS motor subscore is 6.     Cranial Nerves: Cranial nerves 2-12 are intact. No cranial nerve deficit, dysarthria or facial asymmetry.     Sensory: Sensation is intact. No sensory deficit.     Motor: Motor function is intact. No weakness, tremor, atrophy, abnormal muscle tone or seizure activity.     Coordination: Coordination is intact. Coordination normal.     Gait: Gait is intact. Gait normal.     Comments: In/out of chair and on/off exam table without difficulty; gait sure and steady in clinic normal heel/toe gait; bilateral hand grasp equal 5/5  Psychiatric:        Attention and Perception: Attention and perception normal.        Mood and Affect: Mood and affect normal. Mood is not anxious or depressed.        Speech: Speech normal.        Behavior: Behavior normal. Behavior is not agitated. Behavior is cooperative.        Thought Content: Thought content normal.        Cognition and Memory: Cognition and memory normal. Cognition is not impaired. Memory is not impaired.        Judgment: Judgment normal.          Assessment & Plan:   A-benign paroxysmal positional vertigo, eustachian tube dysfunction bilateral, acute rhinitis,  cigarette nicotine dependence without complication  P-Discussed with patient otitis media with effusion probably causing vertigo but could also be age. Supportive treatment may take up to 4 doses meclizine per day max 100mg  per 24 hours. Patient may trial phenyleprhine 10mg  po q6h during the day instead of meclizine as does not cause her drowsiness while at work.  Discussed signs/symptoms stroke/TIA with patient and given exitcare handout on TIA printed.  Someone to drive her home recommended not driving  during vertigo episodes. Follow up if aphasia, dysphasia, visual changes, weakness, fall, worst headache of life, incoordination same day ER.  If fever, ear discharge, worsening ear pain contact me at [phone] or [email or web address]. Consider ENT evaluation/follow up with PCM if worsening symptoms not controlled  with meclizine. Discussed high salt intake, caffeine and nicotine and/or dehydration can worsen vertigo symptoms.   Exitcare handout on vertigo printed and given to patient.   Patient verbalized understanding of information/agreed with plan of care and had no further questions at this time.     No evidence of invasive bacterial infection, non toxic and well hydrated.  I do not see where any further testing or imaging is necessary at this time.   I will suggest supportive care, rest, good hygiene and encourage the patient to take adequate fluids.  The patient is to return to clinic or EMERGENCY ROOM if symptoms worsen or change significantly e.g. ear pain, fever, purulent discharge from ears or bleeding.  Discussed with patient post nasal drip irritates throat/causes swelling blocks eustachian tubes from draining and fluid fills up middle ear.  Bacteria/viruses can grow in fluid and with moving head tube compressed and increases pressure in tube/ear worsening pain.  Studies show will take 30 days for fluid to resolve after post nasal drip controlled with nasal steroid/antihistamine. Antibiotics and steroids do not speed up fluid removal.  Patient verbalized agreement and understanding of treatment plan and had no further questions at this time.   Patient to do home covid test to ensure rhinitis and vertigo not related to a covid infection.  Discussed other patients have had positive test with these symptoms.  Patient may use normal saline nasal spray 2 sprays each nostril q2h wa as needed. flonase 1 spray each nostril BID.  Singulair 10mg  po qhs.  Patient denied personal or family history of ENT  cancer.  OTC antihistamine of choice claritin/zyrtec 10mg  po daily.  Avoid triggers if possible.  Shower prior to bedtime if exposed to triggers.  If allergic dust/dust mites recommend mattress/pillow covers/encasements; washing linens, vacuuming, sweeping, dusting weekly.  Call or return to clinic as needed if these symptoms worsen or fail to improve as anticipated.  Patient verbalized understanding of instructions, agreed with plan of care and had no further questions at this time.  P2:  Avoidance and hand washing.   Discussed nicotine patch to assist with smoking cessation.  Patient stated she will come to see me after the holidays to discuss smoking cessation aid options.  Patient verbalized understanding information/instructions, agreed with plan of care and had no further questions at this time.

## 2021-08-16 NOTE — Patient Instructions (Addendum)
Transient Ischemic Attack A transient ischemic attack (TIA) causes stroke-like symptoms that go away quickly. Having a TIA means that a person is at higher risk for a stroke. A TIA happens when blood supply to the brain is blocked temporarily. A TIA is a medical emergency. What are the causes? This condition is caused by a temporary blockage in an artery in the head or neck. This means the brain does not get the blood supply it needs. There is no permanent brain damage with a TIA. A blockage can be caused by: Fatty buildup in an artery in the head or neck (atherosclerosis). A blood clot. An artery tear (dissection). Inflammation of an artery (vasculitis). Sometimes the cause is not known. What increases the risk? Certain factors may make you more likely to develop this condition. Some of these are things that you can change, such as: Obesity. Using products that contain nicotine or tobacco. Taking oral birth control, especially if you also use tobacco. Not being active. Heavy alcohol use. Drug use, especially cocaine and methamphetamine. Medical conditions that may increase your risk include: High blood pressure (hypertension). High cholesterol. Diabetes. Heart disease (coronary artery disease). An irregular heartbeat, also called atrial fibrillation (AFib). Sickle cell disease. Sleep problems (sleep apnea). Chronic inflammatory diseases, such as rheumatoid arthritis or lupus. Blood clotting disorders (hypercoagulable state). Other risk factors include: Being over the age of 74. Being female. Family history of stroke. Previous history of blood clots, stroke, TIA, or heart attack. Having a history of preeclampsia. Migraine headache. What are the signs or symptoms? Symptoms of a TIA are the same as those of a stroke. The symptoms develop suddenly, and then go away quickly. They may include: Weakness or numbness in your face, arm, or leg, especially on one side of your body. Trouble  walking or moving your arms or legs. Trouble speaking, understanding speech, or both (aphasia). Vision changes, such as double vision, blurred vision, or loss of vision. Dizziness. Confusion. Loss of balance or coordination. Nausea and vomiting. Severe headache. If possible, note what time your symptoms started. Tell your health care provider. How is this diagnosed? This condition may be diagnosed based on: Your symptoms and medical history. A physical exam. Imaging tests, usually a CT scan or MRI of the brain. Blood tests. You may also have other tests, including: Electrocardiogram (ECG). Echocardiogram. Carotid ultrasound. A scan of blood circulation in the brain (CT angiogram or MR angiogram). Continuous heart monitoring. How is this treated? The goal of treatment is to reduce the risk for a stroke. Stroke prevention therapies may include: Changes to diet and lifestyle, such as being physically active and stopping smoking. Medicines to thin the blood (antiplatelets or anticoagulants). Blood pressure medicines. Medicines to reduce cholesterol. Treating other health conditions, such as diabetes or AFib. If testing shows a narrowing in the arteries to your brain, your health care provider may recommend a procedure, such as: Carotid endarterectomy. This is done to remove the blockage from your artery. Carotid angioplasty and stenting. This uses a tube (stent) to open or widen an artery in the neck. The stent helps keep the artery open by supporting the artery walls. Follow these instructions at home: Medicines Take over-the-counter and prescription medicines only as told by your health care provider. If you were told to take a medicine to thin your blood, such as aspirin or an anticoagulant, take it exactly as told by your health care provider. Taking too much blood-thinning medicine can cause bleeding. Taking too little will  not protect you against a stroke and other  problems. Eating and drinking  Eat 5 or more servings of fruits and vegetables each day. Follow guidelines from your health care provider about your diet. You may need to follow a certain diet to help manage risk factors for stroke. This may include: Eating a low-fat, low-salt diet. Choosing high-fiber foods. Limiting carbohydrates and sugar. If you drink alcohol: Limit how much you have to: 0-1 drink a day for women who are not pregnant. 0-2 drinks a day for men. Know how much alcohol is in a drink. In the [place] one drink equals one 12 oz bottle of beer ( ), one 5 oz glass of wine ( ), or one 1 oz glass of hard liquor (72mL). General instructions Maintain a healthy weight. Try to get at least 30 minutes of exercise on most days. Get treatment if you have sleep apnea. Do not use any products that contain nicotine or tobacco. These products include cigarettes, chewing tobacco, and vaping devices, such as e-cigarettes. If you need help quitting, ask your health care provider. Do not use drugs. Keep all follow-up visits. This is important. Where to find more information American Stroke Association: www.stroke.org Get help right away if: You have chest pain or an irregular heartbeat. You have any symptoms of a stroke. "BE FAST" is an easy way to remember the main warning signs of a stroke. B - Balance. Signs are dizziness, sudden trouble walking, or loss of balance. E - Eyes. Signs are trouble seeing or a sudden change in vision. F - Face. Signs are sudden weakness or numbness of the face, or the face or eyelid drooping on one side. A - Arms. Signs are weakness or numbness in an arm. This happens suddenly and usually on one side of the body. S - Speech. Signs are sudden trouble speaking, slurred speech, or trouble understanding what people say. T - Time. Time to call emergency services. Write down what time symptoms started. You have other signs of a stroke, such as: A sudden,  severe headache with no known cause. Nausea or vomiting. Seizure. These symptoms may represent a serious problem that is an emergency. Do not wait to see if the symptoms will go away. Get medical help right away. Call your local emergency services (911 in the [place] Do not drive yourself to the hospital. Summary A transient ischemic attack (TIA) happens when an artery in the head or neck is blocked. The blockage clears before there is any permanent brain damage. A TIA is a medical emergency. Symptoms of a TIA are the same as those of a stroke. The symptoms develop suddenly, and then go away quickly. Having a TIA means that you are at higher risk for a stroke. The goal of treatment is to reduce your risk for a stroke. Treatment may include medicines to thin the blood and changes to diet and lifestyle. This information is not intended to replace advice given to you by your health care provider. Make sure you discuss any questions you have with your health care provider. Document Revised: 03/07/2020 Document Reviewed: 03/07/2020 Elsevier Patient Education  2022 [employer] Dizziness Dizziness is a common problem. It is a feeling of unsteadiness or light-headedness. You may feel like you are about to faint. Dizziness can lead to injury if you stumble or fall. Anyone can become dizzy, but dizziness is more common in older adults. This condition can be caused by a number of things, including medicines, dehydration, or illness. Follow  these instructions at home: Eating and drinking  Drink enough fluid to keep your urine pale yellow. This helps to keep you from becoming dehydrated. Try to drink more clear fluids, such as water. Do not drink alcohol. Limit your caffeine intake if told to do so by your health care provider. Check ingredients and nutrition facts to see if a food or beverage contains caffeine. Limit your salt (sodium) intake if told to do so by your health care provider. Check ingredients  and nutrition facts to see if a food or beverage contains sodium. Activity  Avoid making quick movements. Rise slowly from chairs and steady yourself until you feel okay. In the morning, first sit up on the side of the bed. When you feel okay, stand slowly while you hold onto something until you know that your balance is good. If you need to stand in one place for a long time, move your legs often. Tighten and relax the muscles in your legs while you are standing. Do not drive or use machinery if you feel dizzy. Avoid bending down if you feel dizzy. Place items in your home so that they are easy for you to reach without leaning over. Lifestyle Do not use any products that contain nicotine or tobacco. These products include cigarettes, chewing tobacco, and vaping devices, such as e-cigarettes. If you need help quitting, ask your health care provider. Try to reduce your stress level by using methods such as yoga or meditation. Talk with your health care provider if you need help to manage your stress. General instructions Watch your dizziness for any changes. Take over-the-counter and prescription medicines only as told by your health care provider. Talk with your health care provider if you think that your dizziness is caused by a medicine that you are taking. Tell a friend or a family member that you are feeling dizzy. If he or she notices any changes in your behavior, have this person call your health care provider. Keep all follow-up visits. This is important. Contact a health care provider if: Your dizziness does not go away or you have new symptoms. Your dizziness or light-headedness gets worse. You feel nauseous. You have reduced hearing. You have a fever. You have neck pain or a stiff neck. Your dizziness leads to an injury or a fall. Get help right away if: You vomit or have diarrhea and are unable to eat or drink anything. You have problems talking, walking, swallowing, or using  your arms, hands, or legs. You feel generally weak. You have any bleeding. You are not thinking clearly or you have trouble forming sentences. It may take a friend or family member to notice this. You have chest pain, abdominal pain, shortness of breath, or sweating. Your vision changes or you develop a severe headache. These symptoms may represent a serious problem that is an emergency. Do not wait to see if the symptoms will go away. Get medical help right away. Call your local emergency services (911 in the [place] Do not drive yourself to the hospital. Summary Dizziness is a feeling of unsteadiness or light-headedness. This condition can be caused by a number of things, including medicines, dehydration, or illness. Anyone can become dizzy, but dizziness is more common in older adults. Drink enough fluid to keep your urine pale yellow. Do not drink alcohol. Avoid making quick movements if you feel dizzy. Monitor your dizziness for any changes. This information is not intended to replace advice given to you by your health care  provider. Make sure you discuss any questions you have with your health care provider. Document Revised: 07/17/2020 Document Reviewed: 07/17/2020 Elsevier Patient Education  2022 [employer] Benign Positional Vertigo Vertigo is the feeling that you or your surroundings are moving when they are not. Benign positional vertigo is the most common form of vertigo. This is usually a harmless condition (benign). This condition is positional. This means that symptoms are triggered by certain movements and positions. This condition can be dangerous if it occurs while you are doing something that could cause harm to yourself or others. This includes activities such as driving or operating machinery. What are the causes? The inner ear has fluid-filled canals that help your brain sense movement and balance. When the fluid moves, the brain receives messages about your body's  position. With benign positional vertigo, calcium crystals in the inner ear break free and disturb the inner ear area. This causes your brain to receive confusing messages about your body's position. What increases the risk? You are more likely to develop this condition if: You are a woman. You are 64 years of age or older. You have recently had a head injury. You have an inner ear disease. What are the signs or symptoms? Symptoms of this condition usually happen when you move your head or your eyes in different directions. Symptoms may start suddenly and usually last for less than a minute. They include: Loss of balance and falling. Feeling like you are spinning or moving. Feeling like your surroundings are spinning or moving. Nausea and vomiting. Blurred vision. Dizziness. Involuntary eye movement (nystagmus). Symptoms can be mild and cause only minor problems, or they can be severe and interfere with daily life. Episodes of benign positional vertigo may return (recur) over time. Symptoms may also improve over time. How is this diagnosed? This condition may be diagnosed based on: Your medical history. A physical exam of the head, neck, and ears. Positional tests to check for or stimulate vertigo. You may be asked to turn your head and change positions, such as going from sitting to lying down. A health care provider will watch for symptoms of vertigo. You may be referred to a health care provider who specializes in ear, nose, and throat problems (ENT or otolaryngologist) or a provider who specializes in disorders of the nervous system (neurologist). How is this treated? This condition may be treated in a session in which your health care provider moves your head in specific positions to help the displaced crystals in your inner ear move. Treatment for this condition may take several sessions. Surgery may be needed in severe cases, but this is rare. In some cases, benign positional vertigo  may resolve on its own in 2-4 weeks. Follow these instructions at home: Safety Move slowly. Avoid sudden body or head movements or certain positions, as told by your health care provider. Avoid driving or operating machinery until your health care provider says it is safe. Avoid doing any tasks that would be dangerous to you or others if vertigo occurs. If you have trouble walking or keeping your balance, try using a cane for stability. If you feel dizzy or unstable, sit down right away. Return to your normal activities as told by your health care provider. Ask your health care provider what activities are safe for you. General instructions Take over-the-counter and prescription medicines only as told by your health care provider. Drink enough fluid to keep your urine pale yellow. Keep all follow-up visits. This is important.  Contact a health care provider if: You have a fever. Your condition gets worse or you develop new symptoms. Your family or friends notice any behavioral changes. You have nausea or vomiting that gets worse. You have numbness or a prickling and tingling sensation. Get help right away if you: Have difficulty speaking or moving. Are always dizzy or faint. Develop severe headaches. Have weakness in your legs or arms. Have changes in your hearing or vision. Develop a stiff neck. Develop sensitivity to light. These symptoms may represent a serious problem that is an emergency. Do not wait to see if the symptoms will go away. Get medical help right away. Call your local emergency services (911 in the [place] Do not drive yourself to the hospital. Summary Vertigo is the feeling that you or your surroundings are moving when they are not. Benign positional vertigo is the most common form of vertigo. This condition is caused by calcium crystals in the inner ear that become displaced. This causes a disturbance in an area of the inner ear that helps your brain sense movement and  balance. Symptoms include loss of balance and falling, feeling that you or your surroundings are moving, nausea and vomiting, and blurred vision. This condition can be diagnosed based on symptoms, a physical exam, and positional tests. Follow safety instructions as told by your health care provider and keep all follow-up visits. This is important. This information is not intended to replace advice given to you by your health care provider. Make sure you discuss any questions you have with your health care provider. Document Revised: 07/12/2020 Document Reviewed: 07/12/2020 Elsevier Patient Education  2022 [employer] Smoking Tobacco Information, Adult Smoking tobacco can be harmful to your health. Tobacco contains a poisonous (toxic), colorless chemical called nicotine. Nicotine is addictive. It changes the brain and can make it hard to stop smoking. Tobacco also has other toxic chemicals that can hurt your body and raise your risk of many cancers. How can smoking tobacco affect me? Smoking tobacco puts you at risk for: Cancer. Smoking is most commonly associated with lung cancer, but can also lead to cancer in other parts of the body. Chronic obstructive pulmonary disease (COPD). This is a long-term lung condition that makes it hard to breathe. It also gets worse over time. High blood pressure (hypertension), heart disease, stroke, or heart attack. Lung infections, such as pneumonia. Cataracts. This is when the lenses in the eyes become clouded. Digestive problems. This may include peptic ulcers, heartburn, and gastroesophageal reflux disease (GERD). Oral health problems, such as gum disease and tooth loss. Loss of taste and smell. Smoking can affect your appearance by causing: Wrinkles. Yellow or stained teeth, fingers, and fingernails. Smoking tobacco can also affect your social life, because: It may be challenging to find places to smoke when away from home. Many workplaces,  [employer], hotels, and public places are tobacco-free. Smoking is expensive. This is due to the cost of tobacco and the long-term costs of treating health problems from smoking. Secondhand smoke may affect those around you. Secondhand smoke can cause lung cancer, breathing problems, and heart disease. Children of smokers have a higher risk for: Sudden infant death syndrome (SIDS). Ear infections. Lung infections. If you currently smoke tobacco, quitting now can help you: Lead a longer and healthier life. Look, smell, breathe, and feel better over time. Save money. Protect others from the harms of secondhand smoke. What actions can I take to prevent health problems? Quit smoking  Do not start  smoking. Quit if you already do. Make a plan to quit smoking and commit to it. Look for programs to help you and ask your health care provider for recommendations and ideas. Set a date and write down all the reasons you want to quit. Let your friends and family know you are quitting so they can help and support you. Consider finding friends who also want to quit. It can be easier to quit with someone else, so that you can support each other. Talk with your health care provider about using nicotine replacement medicines to help you quit, such as gum, lozenges, patches, sprays, or pills. Do not replace cigarette smoking with electronic cigarettes, which are commonly called e-cigarettes. The safety of e-cigarettes is not known, and some may contain harmful chemicals. If you try to quit but return to smoking, stay positive. It is common to slip up when you first quit, so take it one day at a time. Be prepared for cravings. When you feel the urge to smoke, chew gum or suck on hard candy. Lifestyle Stay busy and take care of your body. Drink enough fluid to keep your urine pale yellow. Get plenty of exercise and eat a healthy diet. This can help prevent weight gain after quitting. Monitor your eating  habits. Quitting smoking can cause you to have a larger appetite than when you smoke. Find ways to relax. Go out with friends or family to a movie or a restaurant where people do not smoke. Ask your health care provider about having regular tests (screenings) to check for cancer. This may include blood tests, imaging tests, and other tests. Find ways to manage your stress, such as meditation, yoga, or exercise. Where to find support To get support to quit smoking, consider: Asking your health care provider for more information and resources. Taking classes to learn more about quitting smoking. Looking for local organizations that offer resources about quitting smoking. Joining a support group for people who want to quit smoking in your local community. Calling the smokefree.gov counselor helpline: 1-800-Quit-Now 270-001-9993) Where to find more information You may find more information about quitting smoking from: HelpGuide.org: www.helpguide.org [employer]: smokefree.gov American Lung Association: www.lung.org Contact a health care provider if you: Have problems breathing. Notice that your lips, nose, or fingers turn blue. Have chest pain. Are coughing up blood. Feel faint or you pass out. Have other health changes that cause you to worry. Summary Smoking tobacco can negatively affect your health, the health of those around you, your finances, and your social life. Do not start smoking. Quit if you already do. If you need help quitting, ask your health care provider. Think about joining a support group for people who want to quit smoking in your local community. There are many effective programs that will help you to quit this behavior. This information is not intended to replace advice given to you by your health care provider. Make sure you discuss any questions you have with your health care provider. Document Revised: 04/16/2021 Document Reviewed: 07/04/2020 Elsevier Patient  Education  2022 [employer].

## 2021-09-10 ENCOUNTER — Ambulatory Visit: Payer: Self-pay | Admitting: Registered Nurse

## 2021-09-10 ENCOUNTER — Other Ambulatory Visit: Payer: Self-pay

## 2021-09-10 ENCOUNTER — Encounter: Payer: Self-pay | Admitting: Registered Nurse

## 2021-09-10 VITALS — BP 100/80 | HR 66 | Temp 97.3°F | Resp 16

## 2021-09-10 DIAGNOSIS — H6983 Other specified disorders of Eustachian tube, bilateral: Secondary | ICD-10-CM

## 2021-09-10 DIAGNOSIS — J019 Acute sinusitis, unspecified: Secondary | ICD-10-CM

## 2021-09-10 MED ORDER — BENZONATATE 200 MG PO CAPS: 200.0000 mg | ORAL_CAPSULE | Freq: Two times a day (BID) | ORAL | 0 refills | Status: AC | PRN

## 2021-09-10 MED ORDER — PREDNISONE 10 MG (21) PO TBPK: ORAL_TABLET | ORAL | 0 refills | Status: DC

## 2021-09-10 MED ORDER — SALINE SPRAY 0.65 % NA SOLN: 2.0000 | NASAL | 0 refills | Status: DC

## 2021-09-10 MED ORDER — AMOXICILLIN-POT CLAVULANATE 875-125 MG PO TABS
1.0000 | ORAL_TABLET | Freq: Two times a day (BID) | ORAL | 0 refills | Status: DC
Start: 2021-09-10 — End: 2021-09-18

## 2021-09-10 NOTE — Patient Instructions (Signed)
How to Perform a Sinus Rinse °A sinus rinse is a home treatment that is used to rinse your sinuses with a germ-free (sterile) mixture of salt and water (saline solution). Sinuses are air-filled spaces in your skull that are behind the bones of your face and forehead. They open into your nasal cavity. °A sinus rinse can help to clear mucus, dirt, dust, or pollen from your nasal cavity. You may do a sinus rinse when you have a cold, a virus, nasal allergy symptoms, a sinus infection, or stuffiness in your nose or sinuses. °What are the risks? °A sinus rinse is generally safe and effective. However, there are a few risks, which include: °A burning sensation in your sinuses. This may happen if you do not make the saline solution as directed. Be sure to follow all directions when making the saline solution. °Nasal irritation. °Infection. This may be from unclean supplies or from contaminated water. Infection from contaminated water is rare, but possible. °Do not do a sinus rinse if you have had ear or nasal surgery, ear infection, or plugged ears, unless recommended by your health care provider. °Supplies needed: °Saline solution or powder. °Distilled or sterile water to mix with saline powder. °You may use boiled and cooled tap water. Boil tap water for 5 minutes; cool until it is lukewarm. Use within 24 hours. °Do not use regular tap water to mix with the saline solution. °Neti pot or nasal rinse bottle. These supplies release the saline solution into your nose and through your sinuses. Neti pots and nasal rinse bottles can be purchased at your local pharmacy, a health food store, or online. °How to perform a sinus rinse ° °Wash your hands with soap and water for at least 20 seconds. If soap and water are not available, use hand sanitizer. °Wash your device according to the directions that came with the product and then dry it. °Use the solution that comes with your product or one that is sold separately in stores.  Follow the mixing directions on the package to mix with sterile or distilled water. °Fill the device with the amount of saline solution noted in the device instructions. °Stand by a sink and tilt your head sideways over the sink. °Place the spout of the device in your upper nostril (the one closer to the ceiling). °Gently pour or squeeze the saline solution into your nasal cavity. The liquid should drain out from the lower nostril if you are not too congested. °While rinsing, breathe through your open mouth. °Gently blow your nose to clear any mucus and rinse solution. Blowing too hard may cause ear pain. °Turn your head in the other direction and repeat in your other nostril. °Clean and rinse your device with clean water and then air-dry it. °Talk with your health care provider or pharmacist if you have questions about how to do a sinus rinse. °Summary °A sinus rinse is a home treatment that is used to rinse your sinuses with a sterile mixture of salt and water (saline solution). °You may do a sinus rinse when you have a cold, a virus, nasal allergy symptoms, a sinus infection, or stuffiness in your nose or sinuses. °A sinus rinse is generally safe and effective. Follow all instructions carefully. °This information is not intended to replace advice given to you by your health care provider. Make sure you discuss any questions you have with your health care provider. °Document Revised: 01/29/2021 Document Reviewed: 01/29/2021 °Elsevier Patient Education © 2022 [employer] °Sinusitis,   Adult °Sinusitis is inflammation of your sinuses. Sinuses are hollow spaces in the bones around your face. Your sinuses are located: °Around your eyes. °In the middle of your forehead. °Behind your nose. °In your cheekbones. °Mucus normally drains out of your sinuses. When your nasal tissues become inflamed or swollen, mucus can become trapped or blocked. This allows bacteria, viruses, and fungi to grow, which leads to infection. Most  infections of the sinuses are caused by a virus. °Sinusitis can develop quickly. It can last for up to 4 weeks (acute) or for more than 12 weeks (chronic). Sinusitis often develops after a cold. °What are the causes? °This condition is caused by anything that creates swelling in the sinuses or stops mucus from draining. This includes: °Allergies. °Asthma. °Infection from bacteria or viruses. °Deformities or blockages in your nose or sinuses. °Abnormal growths in the nose (nasal polyps). °Pollutants, such as chemicals or irritants in the air. °Infection from fungi (rare). °What increases the risk? °You are more likely to develop this condition if you: °Have a weak body defense system (immune system). °Do a lot of swimming or diving. °Overuse nasal sprays. °Smoke. °What are the signs or symptoms? °The main symptoms of this condition are pain and a feeling of pressure around the affected sinuses. Other symptoms include: °Stuffy nose or congestion. °Thick drainage from your nose. °Swelling and warmth over the affected sinuses. °Headache. °Upper toothache. °A cough that may get worse at night. °Extra mucus that collects in the throat or the back of the nose (postnasal drip). °Decreased sense of smell and taste. °Fatigue. °A fever. °Sore throat. °Bad breath. °How is this diagnosed? °This condition is diagnosed based on: °Your symptoms. °Your medical history. °A physical exam. °Tests to find out if your condition is acute or chronic. This may include: °Checking your nose for nasal polyps. °Viewing your sinuses using a device that has a light (endoscope). °Testing for allergies or bacteria. °Imaging tests, such as an MRI or CT scan. °In rare cases, a bone biopsy may be done to rule out more serious types of fungal sinus disease. °How is this treated? °Treatment for sinusitis depends on the cause and whether your condition is chronic or acute. °If caused by a virus, your symptoms should go away on their own within 10 days.  You may be given medicines to relieve symptoms. They include: °Medicines that shrink swollen nasal passages (topical intranasal decongestants). °Medicines that treat allergies (antihistamines). °A spray that eases inflammation of the nostrils (topical intranasal corticosteroids). °Rinses that help get rid of thick mucus in your nose (nasal saline washes). °If caused by bacteria, your health care provider may recommend waiting to see if your symptoms improve. Most bacterial infections will get better without antibiotic medicine. You may be given antibiotics if you have: °A severe infection. °A weak immune system. °If caused by narrow nasal passages or nasal polyps, you may need to have surgery. °Follow these instructions at home: °Medicines °Take, use, or apply over-the-counter and prescription medicines only as told by your health care provider. These may include nasal sprays. °If you were prescribed an antibiotic medicine, take it as told by your health care provider. Do not stop taking the antibiotic even if you start to feel better. °Hydrate and humidify ° °Drink enough fluid to keep your urine pale yellow. Staying hydrated will help to thin your mucus. °Use a cool mist humidifier to keep the humidity level in your home above 50%. °Inhale steam for 10-15 minutes, 3-4 times   a day, or as told by your health care provider. You can do this in the bathroom while a hot shower is running. °Limit your exposure to cool or dry air. °Rest °Rest as much as possible. °Sleep with your head raised (elevated). °Make sure you get enough sleep each night. °General instructions ° °Apply a warm, moist washcloth to your face 3-4 times a day or as told by your health care provider. This will help with discomfort. °Wash your hands often with soap and water to reduce your exposure to germs. If soap and water are not available, use hand sanitizer. °Do not smoke. Avoid being around people who are smoking (secondhand smoke). °Keep all  follow-up visits as told by your health care provider. This is important. °Contact a health care provider if: °You have a fever. °Your symptoms get worse. °Your symptoms do not improve within 10 days. °Get help right away if: °You have a severe headache. °You have persistent vomiting. °You have severe pain or swelling around your face or eyes. °You have vision problems. °You develop confusion. °Your neck is stiff. °You have trouble breathing. °Summary °Sinusitis is soreness and inflammation of your sinuses. Sinuses are hollow spaces in the bones around your face. °This condition is caused by nasal tissues that become inflamed or swollen. The swelling traps or blocks the flow of mucus. This allows bacteria, viruses, and fungi to grow, which leads to infection. °If you were prescribed an antibiotic medicine, take it as told by your health care provider. Do not stop taking the antibiotic even if you start to feel better. °Keep all follow-up visits as told by your health care provider. This is important. °This information is not intended to replace advice given to you by your health care provider. Make sure you discuss any questions you have with your health care provider. °Document Revised: 01/12/2018 Document Reviewed: 01/12/2018 °Elsevier Patient Education © 2022 [employer] ° °

## 2021-09-10 NOTE — Progress Notes (Signed)
Subjective:    Patient ID: Rebecca Sweeney, female    DOB: 09/04/1960, 61 y.o.   MRN: [id number]  60y/o caucasian female established patient with history of recurrent sinusitis.  Last antibiotics 2022 and prednisone 2022.  Patient reported she has been using flonase, nasal saline and allergy po medications without relief.  Has not home covid tested.  Discharge nose pink and sinus pressure noted forehead and cheek right only.     Worried post nasal drip will worsen and she will end up with bronchitis again.  Would like refill on tessalon pearles also.  Right ear has been popping also.     Review of Systems  Constitutional:  Negative for activity change, appetite change, chills, diaphoresis, fatigue and fever.  HENT:  Positive for congestion, ear pain, postnasal drip, rhinorrhea, sinus pressure, sinus pain and sore throat. Negative for dental problem, ear discharge, facial swelling, hearing loss, mouth sores, nosebleeds, sneezing, tinnitus, trouble swallowing and voice change.   Eyes:  Negative for photophobia, pain, discharge, redness, itching and visual disturbance.  Respiratory:  Positive for cough. Negative for choking, chest tightness, shortness of breath, wheezing and stridor.   Cardiovascular:  Negative for chest pain.  Gastrointestinal:  Negative for diarrhea, nausea and vomiting.  Endocrine: Negative for cold intolerance and heat intolerance.  Genitourinary:  Negative for difficulty urinating.  Musculoskeletal:  Negative for back pain, gait problem, neck pain and neck stiffness.  Skin:  Negative for color change and rash.  Allergic/Immunologic: Positive for environmental allergies. Negative for food allergies.  Neurological:  Positive for headaches. Negative for dizziness, tremors, seizures, syncope, facial asymmetry, speech difficulty, weakness, light-headedness and numbness.  Hematological:  Negative for adenopathy. Does not bruise/bleed easily.  Psychiatric/Behavioral:  Negative for  agitation, confusion and sleep disturbance.       Objective:   Physical Exam Vitals and nursing note reviewed.  Constitutional:      General: She is awake. She is not in acute distress.    Appearance: Normal appearance. She is well-developed, well-groomed and normal weight. She is not ill-appearing, toxic-appearing or diaphoretic.  HENT:     Head: Normocephalic and atraumatic.     Jaw: There is normal jaw occlusion. No trismus or pain on movement.     Salivary Glands: Right salivary gland is not diffusely enlarged or tender. Left salivary gland is not diffusely enlarged or tender.     Right Ear: Hearing, ear canal and external ear normal. No decreased hearing noted. No laceration, drainage or swelling. A middle ear effusion is present. There is no impacted cerumen. No hemotympanum. Tympanic membrane is not injected, scarred, perforated, erythematous or retracted.     Left Ear: Hearing, ear canal and external ear normal. No decreased hearing noted. No laceration, drainage or swelling. A middle ear effusion is present. There is no impacted cerumen. No hemotympanum. Tympanic membrane is not injected, scarred, perforated, erythematous or retracted.     Ears:     Comments: Right TM intact with air fluid level cloudy; left TM air fluid level clear; bilateral allergic shiners; nasal turbinates edema erythema clear discharge; cobblestoning posterior pharynx    Nose: Mucosal edema, congestion and rhinorrhea present. No nasal deformity, septal deviation or laceration. Rhinorrhea is clear.     Right Turbinates: Enlarged and swollen. Not pale.     Left Turbinates: Enlarged and swollen. Not pale.     Right Sinus: Maxillary sinus tenderness and frontal sinus tenderness present.     Left Sinus: No maxillary sinus tenderness or  frontal sinus tenderness.     Mouth/Throat:     Lips: Pink. No lesions.     Mouth: Mucous membranes are moist. Mucous membranes are not pale, not dry and not cyanotic. No lacerations,  oral lesions or angioedema.     Dentition: Abnormal dentition. Has dentures. No dental abscesses or gum lesions.     Pharynx: Uvula midline. Pharyngeal swelling and posterior oropharyngeal erythema present. No oropharyngeal exudate or uvula swelling.     Tonsils: No tonsillar exudate or tonsillar abscesses.  Eyes:     General: Lids are normal. Vision grossly intact. Gaze aligned appropriately. Allergic shiner present. No scleral icterus.       Right eye: No foreign body, discharge or hordeolum.        Left eye: No foreign body, discharge or hordeolum.     Extraocular Movements:     Right eye: Normal extraocular motion and no nystagmus.     Left eye: Normal extraocular motion and no nystagmus.     Conjunctiva/sclera: Conjunctivae normal.     Right eye: Right conjunctiva is not injected. No chemosis, exudate or hemorrhage.    Left eye: Left conjunctiva is not injected. No chemosis, exudate or hemorrhage.    Pupils: Pupils are equal, round, and reactive to light. Pupils are equal.     Right eye: Pupil is round and reactive.     Left eye: Pupil is round and reactive.  Neck:     Thyroid: No thyroid mass or thyromegaly.     Trachea: Trachea and phonation normal. No tracheal tenderness or tracheal deviation.  Cardiovascular:     Rate and Rhythm: Normal rate and regular rhythm.     Pulses:          Radial pulses are 2+ on the right side and 2+ on the left side.     Heart sounds: Normal heart sounds, S1 normal and S2 normal.  Pulmonary:     Effort: Pulmonary effort is normal. No accessory muscle usage or respiratory distress.     Breath sounds: Normal breath sounds and air entry. No stridor, decreased air movement or transmitted upper airway sounds. No decreased breath sounds, wheezing, rhonchi or rales.  Chest:     Chest wall: No tenderness.  Abdominal:     General: There is no distension.     Palpations: Abdomen is soft.  Musculoskeletal:        General: No tenderness. Normal range of  motion.     Right shoulder: Normal.     Left shoulder: Normal.     Right hand: Normal.     Left hand: Normal.     Cervical back: Normal range of motion and neck supple. No swelling, edema, deformity, erythema, signs of trauma, lacerations, rigidity, tenderness or crepitus. No pain with movement. Normal range of motion.     Thoracic back: No swelling, edema, deformity or signs of trauma.     Right hip: Normal.     Left hip: Normal.     Right knee: Normal.     Left knee: Normal.  Lymphadenopathy:     Head:     Right side of head: No submental, submandibular, tonsillar, preauricular, posterior auricular or occipital adenopathy.     Left side of head: No submental, submandibular, tonsillar, preauricular, posterior auricular or occipital adenopathy.     Cervical: No cervical adenopathy.     Right cervical: No superficial, deep or posterior cervical adenopathy.    Left cervical: No superficial, deep or posterior cervical  adenopathy.  Skin:    General: Skin is warm and dry.     Capillary Refill: Capillary refill takes less than 2 seconds.     Coloration: Skin is not ashen, cyanotic, jaundiced, mottled, pale or sallow.     Findings: No abrasion, abscess, bruising, burn, ecchymosis, erythema, signs of injury, laceration, lesion, petechiae, rash or wound.     Nails: There is no clubbing.  Neurological:     Mental Status: She is alert and oriented to person, place, and time. She is not disoriented.     GCS: GCS eye subscore is 4. GCS verbal subscore is 5. GCS motor subscore is 6.     Cranial Nerves: Cranial nerves 2-12 are intact. No cranial nerve deficit, dysarthria or facial asymmetry.     Sensory: Sensation is intact. No sensory deficit.     Motor: Motor function is intact. No weakness, tremor, atrophy, abnormal muscle tone or seizure activity.     Coordination: Coordination is intact. Coordination normal.     Gait: Gait is intact. Gait normal.     Comments: In/out of chair without  difficulty; gait sure and steady in clinic; bilateral hand grasp equal 5/5  Psychiatric:        Attention and Perception: Attention and perception normal.        Mood and Affect: Mood and affect normal.        Speech: Speech normal.        Behavior: Behavior normal. Behavior is cooperative.        Thought Content: Thought content normal.        Cognition and Memory: Cognition and memory normal.        Judgment: Judgment normal.    Patient given home covid test to perform testing prior to return to [facility]center.   Patient reported results negative and discussed could return to [facility].  Discussed if sneezing/cough to wear mask at work until symptoms resolve due to many viruses currently circulating in community e.g. flu, adenovirus, covid, parainfluenza and RSV.  Patient verbalized understanding information/instructions, agreed with plan of care and had no further questions at this time.      Assessment & Plan:  A-acute rhinosinusitis, eustachian tube dysfunction bilateral  P-Continue flonase 1 spray each nostril BID, saline 2 sprays each nostril q2h wa prn congestion given 1 bottle from clinic stock.  Start prednisone 10mg  taper po with breakfast today (60/50/40/30/20/10) #21 RF0  If no improvement with 48 hours of saline, prednisone and flonase use start augmentin 875mg  po BID x 10 days #20 RF0 dispensed from [facility] to patient  Denied personal or family history of ENT cancer.  Shower BID especially prior to bed. No evidence of systemic bacterial infection, non toxic and well hydrated.  I do not see where any further testing or imaging is necessary at this time.   I will suggest supportive care, rest, good hygiene and encourage the patient to take adequate fluids.  The patient is to return to clinic or EMERGENCY ROOM if symptoms worsen or change significantly.  Exitcare handouts on sinusitis and sinus rinse printed and given to patient.  Patient verbalized agreement and understanding of treatment  plan and had no further questions at this time.   P2:  Hand washing and cover cough    No evidence of invasive bacterial infection, non toxic and well hydrated.  I do not see where any further testing or imaging is necessary at this time.   I will suggest supportive care, rest, good  hygiene and encourage the patient to take adequate fluids.  The patient is to return to clinic or EMERGENCY ROOM if symptoms worsen or change significantly e.g. ear pain, fever, purulent discharge from ears or bleeding.  Discussed with patient post nasal drip irritates throat/causes swelling blocks eustachian tubes from draining and fluid fills up middle ear.  Bacteria/viruses can grow in fluid and with moving head tube compressed and increases pressure in tube/ear worsening pain.  Studies show will take 30 days for fluid to resolve after post nasal drip controlled with nasal steroid/antihistamine. Antibiotics and steroids do not speed up fluid removal.  Patient verbalized agreement and understanding of treatment plan and had no further questions at this time.

## 2021-09-18 ENCOUNTER — Ambulatory Visit: Payer: Self-pay | Admitting: Registered Nurse

## 2021-09-18 ENCOUNTER — Other Ambulatory Visit: Payer: Self-pay

## 2021-09-18 ENCOUNTER — Encounter: Payer: Self-pay | Admitting: Registered Nurse

## 2021-09-18 VITALS — BP 127/95 | HR 71 | Temp 98.6°F

## 2021-09-18 DIAGNOSIS — H6501 Acute serous otitis media, right ear: Secondary | ICD-10-CM

## 2021-09-18 MED ORDER — AMOXICILLIN-POT CLAVULANATE 875-125 MG PO TABS: 1.0000 | ORAL_TABLET | Freq: Two times a day (BID) | ORAL | 0 refills | Status: AC

## 2021-09-18 NOTE — Patient Instructions (Signed)

## 2021-09-18 NOTE — Progress Notes (Signed)
Subjective:    Patient ID: Rebecca Sweeney, female    DOB: Nov 02, 1960, 61 y.o.   MRN: [id number]  60y/o caucasian female established patient here for evaluation ear pain.  Denied fever/chills/discharge ear.  Ear pain worsening from last week.  Last antibiotics per paper chart review augmentin Dec 2022 dispensed from [facility].  Covid test negative last week.     Review of Systems  Constitutional:  Negative for activity change, appetite change, chills, diaphoresis, fatigue and fever.  HENT:  Positive for ear pain and rhinorrhea. Negative for ear discharge, facial swelling, hearing loss, nosebleeds, trouble swallowing and voice change.   Eyes:  Negative for photophobia and visual disturbance.  Respiratory:  Negative for cough, shortness of breath, wheezing and stridor.   Cardiovascular:  Negative for chest pain.  Gastrointestinal:  Negative for nausea and vomiting.  Endocrine: Negative for cold intolerance and heat intolerance.  Genitourinary:  Negative for difficulty urinating.  Musculoskeletal:  Negative for gait problem, neck pain and neck stiffness.  Skin:  Negative for rash.  Allergic/Immunologic: Positive for environmental allergies. Negative for food allergies.  Neurological:  Negative for dizziness, tremors, seizures, syncope, facial asymmetry, speech difficulty, weakness, light-headedness, numbness and headaches.  Hematological:  Negative for adenopathy. Does not bruise/bleed easily.  Psychiatric/Behavioral:  Negative for agitation, confusion and sleep disturbance.       Objective:   Physical Exam Vitals and nursing note reviewed.  Constitutional:      General: She is awake. She is not in acute distress.    Appearance: Normal appearance. She is well-developed, well-groomed and normal weight. She is not ill-appearing, toxic-appearing or diaphoretic.  HENT:     Head: Normocephalic and atraumatic.     Jaw: There is normal jaw occlusion. No trismus.     Salivary Glands: Right salivary  gland is not diffusely enlarged or tender. Left salivary gland is not diffusely enlarged or tender.     Right Ear: Hearing and external ear normal. No decreased hearing noted. Swelling and tenderness present. No laceration or drainage. A middle ear effusion is present. There is no impacted cerumen. No foreign body. No mastoid tenderness. No PE tube. No hemotympanum. Tympanic membrane is erythematous and bulging. Tympanic membrane is not perforated.     Left Ear: Hearing and external ear normal. No decreased hearing noted. Swelling and tenderness present. No laceration or drainage. A middle ear effusion is present. There is no impacted cerumen. No foreign body. No mastoid tenderness. No PE tube. No hemotympanum. Tympanic membrane is erythematous and bulging. Tympanic membrane is not perforated.     Ears:     Comments: Bilateral TMs with air fluid level slight opacity and erythema intact; right greater than left    Nose: Mucosal edema, congestion and rhinorrhea present. No nasal deformity, septal deviation or laceration. Rhinorrhea is clear.     Right Turbinates: Enlarged and swollen. Not pale.     Left Turbinates: Swollen. Not pale.     Right Sinus: No maxillary sinus tenderness or frontal sinus tenderness.     Left Sinus: No maxillary sinus tenderness or frontal sinus tenderness.     Mouth/Throat:     Lips: Pink. No lesions.     Mouth: Mucous membranes are moist. Mucous membranes are not pale, not dry and not cyanotic. No lacerations, oral lesions or angioedema.     Dentition: Has dentures. No dental abscesses or gum lesions.     Pharynx: Uvula midline. Pharyngeal swelling and posterior oropharyngeal erythema present. No oropharyngeal exudate or  uvula swelling.     Tonsils: No tonsillar exudate or tonsillar abscesses.     Comments: Cobblestoning posterior pharynx; bilateral nasal turbinates edema erythema; nasal sniffing/congestion noted in exam room; bilateral allergic shiners Eyes:     General:  Lids are normal. Vision grossly intact. Gaze aligned appropriately. Allergic shiner present. No scleral icterus.       Right eye: No foreign body, discharge or hordeolum.        Left eye: No foreign body, discharge or hordeolum.     Extraocular Movements: Extraocular movements intact.     Right eye: Normal extraocular motion and no nystagmus.     Left eye: Normal extraocular motion and no nystagmus.     Conjunctiva/sclera: Conjunctivae normal.     Right eye: Right conjunctiva is not injected. No chemosis, exudate or hemorrhage.    Left eye: Left conjunctiva is not injected. No chemosis, exudate or hemorrhage.    Pupils: Pupils are equal, round, and reactive to light. Pupils are equal.     Right eye: Pupil is round and reactive.     Left eye: Pupil is round and reactive.  Neck:     Thyroid: No thyroid mass or thyromegaly.     Trachea: Trachea and phonation normal. No tracheal tenderness or tracheal deviation.  Cardiovascular:     Rate and Rhythm: Normal rate and regular rhythm.     Pulses:          Radial pulses are 2+ on the right side and 2+ on the left side.  Pulmonary:     Effort: Pulmonary effort is normal. No accessory muscle usage or respiratory distress.     Breath sounds: Normal breath sounds. No stridor. No decreased breath sounds, wheezing, rhonchi or rales.     Comments: Spoke full sentences without difficulty; no cough observed in exam room Chest:     Chest wall: No tenderness.  Abdominal:     General: Abdomen is flat. There is no distension.     Palpations: Abdomen is soft.  Musculoskeletal:        General: No tenderness. Normal range of motion.     Right shoulder: Normal.     Left shoulder: Normal.     Right hand: Normal.     Left hand: Normal.     Cervical back: Normal range of motion and neck supple. No swelling, edema, deformity, erythema, signs of trauma, lacerations, rigidity, spasms, tenderness or crepitus. No pain with movement. Normal range of motion.      Thoracic back: No swelling, edema, deformity, signs of trauma or lacerations.     Right hip: Normal.     Left hip: Normal.     Right knee: Normal.     Left knee: Normal.  Lymphadenopathy:     Head:     Right side of head: No submental, submandibular, tonsillar, preauricular, posterior auricular or occipital adenopathy.     Left side of head: No submental, submandibular, tonsillar, preauricular, posterior auricular or occipital adenopathy.     Cervical: No cervical adenopathy.     Right cervical: No superficial, deep or posterior cervical adenopathy.    Left cervical: No superficial, deep or posterior cervical adenopathy.  Skin:    General: Skin is warm and dry.     Capillary Refill: Capillary refill takes less than 2 seconds.     Coloration: Skin is not ashen, cyanotic, jaundiced, mottled, pale or sallow.     Findings: No abrasion, abscess, acne, bruising, burn, ecchymosis, erythema, signs of  injury, laceration, lesion, petechiae, rash or wound.     Nails: There is no clubbing.  Neurological:     General: No focal deficit present.     Mental Status: She is alert and oriented to person, place, and time. Mental status is at baseline. She is not disoriented.     GCS: GCS eye subscore is 4. GCS verbal subscore is 5. GCS motor subscore is 6.     Cranial Nerves: Cranial nerves 2-12 are intact. No cranial nerve deficit, dysarthria or facial asymmetry.     Sensory: Sensation is intact. No sensory deficit.     Motor: Motor function is intact. No weakness, tremor, atrophy, abnormal muscle tone or seizure activity.     Coordination: Coordination is intact. Coordination normal.     Gait: Gait is intact. Gait normal.     Comments: In/out of chair and on/off exam table without difficulty; gait sure and steady in clinic; bilateral hand grasp equal 5/5  Psychiatric:        Attention and Perception: Attention and perception normal.        Mood and Affect: Mood and affect normal.        Speech: Speech  normal.        Behavior: Behavior normal. Behavior is cooperative.        Thought Content: Thought content normal.        Cognition and Memory: Cognition and memory normal.        Judgment: Judgment normal.          Assessment & Plan:  A-acute otitis media right nonrecurrent  P-Supportive treatment. Augmentin 875mg  po BID x 10 days #20 RF0 dispensed from [facility] to patient  Tylenol 1000mg  po QID prn pain/fever 8 UD given from clinic stock.   No evidence of invasive bacterial infection, non toxic and well hydrated.  This is most likely self limiting viral infection.  I do not see where any further testing or imaging is necessary at this time.   I will suggest supportive care, rest, good hygiene and encourage the patient to take adequate fluids.  The patient is to return to clinic or EMERGENCY ROOM if symptoms worsen or change significantly e.g. ear pain, fever, purulent discharge from ears or bleeding.  Exitcare handout on otitis media   Patient verbalized agreement and understanding of treatment plan and had no further questions at this time.

## 2021-09-30 ENCOUNTER — Other Ambulatory Visit: Payer: Self-pay | Admitting: Registered Nurse

## 2021-09-30 ENCOUNTER — Encounter: Payer: Self-pay | Admitting: Registered Nurse

## 2021-09-30 DIAGNOSIS — R7989 Other specified abnormal findings of blood chemistry: Secondary | ICD-10-CM

## 2021-09-30 NOTE — Telephone Encounter (Signed)
Last Vitamin D level 10 Nov 22.  Due follow up nonfasting after 10 Oct 2021.  Refilled electronic Rx to her pharmacy of choice cholecalciferol 50,000 units po weekly #12 RF0.  RN Rolly Salter notified to schedule patient this month.

## 2021-10-12 ENCOUNTER — Other Ambulatory Visit: Payer: Self-pay

## 2021-10-12 ENCOUNTER — Other Ambulatory Visit: Payer: Self-pay | Admitting: *Deleted

## 2021-10-12 DIAGNOSIS — E559 Vitamin D deficiency, unspecified: Secondary | ICD-10-CM

## 2021-10-13 LAB — VITAMIN D 25 HYDROXY (VIT D DEFICIENCY, FRACTURES): Vit D, 25-Hydroxy: 57.2 ng/mL (ref 30.0–100.0)

## 2021-10-16 NOTE — Progress Notes (Signed)
Results reviewed with pt in clinic and provided hard copy. Currently taking 2000 units/day. Continue this dose. Pt agreeable. Denies questions.

## 2021-10-16 NOTE — Progress Notes (Signed)
Noted agreed with plan of care continue Vitamin D3 2000 units daily.

## 2022-01-01 ENCOUNTER — Encounter: Payer: Self-pay | Admitting: Registered Nurse

## 2022-01-01 ENCOUNTER — Ambulatory Visit: Payer: Self-pay | Admitting: Registered Nurse

## 2022-01-01 VITALS — BP 109/82 | HR 75 | Temp 97.7°F

## 2022-01-01 DIAGNOSIS — M2550 Pain in unspecified joint: Secondary | ICD-10-CM

## 2022-01-01 DIAGNOSIS — E559 Vitamin D deficiency, unspecified: Secondary | ICD-10-CM

## 2022-01-01 MED ORDER — IBUPROFEN 800 MG PO TABS
800.0000 mg | ORAL_TABLET | Freq: Three times a day (TID) | ORAL | 0 refills | Status: AC | PRN
Start: 2022-01-01 — End: 2022-01-31

## 2022-01-01 NOTE — Progress Notes (Signed)
? ?Subjective:  ? ? Patient ID: Rebecca Sweeney, female    DOB: 1961/03/13, 61 y.o.   MRN: [id number] ? ?61y/o established female pt c/o joint pain in multiple areas including all fingers, both elbows, left hip, right knee, back, etc. Also feeling weak and tired. All sx started Friday 5/5. Last labs Nov 2022 low vitamin D.  Has been taking OTC supplement.  Prepared large family meal this weekend.  Denied gardening/hiking/tick bites/rashes.  All joints sore.  Denied known sick contacts.  Last Labs vaccines 2022. ? ? ? ? ?Review of Systems  ?Constitutional:  Positive for fatigue. Negative for activity change, appetite change, chills, diaphoresis and fever.  ?HENT:  Negative for facial swelling, nosebleeds, sinus pressure, sinus pain, sneezing, sore throat, trouble swallowing and voice change.   ?Eyes:  Negative for photophobia and visual disturbance.  ?Respiratory:  Negative for cough, shortness of breath, wheezing and stridor.   ?Cardiovascular:  Negative for chest pain, palpitations and leg swelling.  ?Gastrointestinal:  Negative for diarrhea, nausea and vomiting.  ?Endocrine: Negative for cold intolerance and heat intolerance.  ?Genitourinary:  Negative for difficulty urinating.  ?Musculoskeletal:  Positive for arthralgias, joint swelling and myalgias. Negative for neck pain and neck stiffness.  ?Skin:  Negative for color change, rash and wound.  ?Allergic/Immunologic: Positive for environmental allergies. Negative for food allergies.  ?Neurological:  Negative for tremors, syncope, speech difficulty, weakness, light-headedness, numbness and headaches.  ?Psychiatric/Behavioral:  Negative for agitation, confusion and sleep disturbance.   ? ?   ?Objective:  ? Physical Exam ?Vitals and nursing note reviewed.  ?Constitutional:   ?   General: She is awake. She is not in acute distress. ?   Appearance: Normal appearance. She is well-developed, well-groomed and normal weight. She is not ill-appearing, toxic-appearing or  diaphoretic.  ?HENT:  ?   Head: Normocephalic and atraumatic.  ?   Jaw: There is normal jaw occlusion.  ?   Salivary Glands: Right salivary gland is not diffusely enlarged. Left salivary gland is not diffusely enlarged.  ?   Right Ear: Hearing and external ear normal.  ?   Left Ear: Hearing and external ear normal.  ?   Nose: Nose normal. No congestion or rhinorrhea.  ?   Mouth/Throat:  ?   Lips: Pink. No lesions.  ?   Mouth: Mucous membranes are moist. No oral lesions or angioedema.  ?   Dentition: No gum lesions.  ?   Pharynx: Oropharynx is clear. Uvula midline. No uvula swelling.  ?Eyes:  ?   General: Lids are normal. Vision grossly intact. Gaze aligned appropriately. Allergic shiner present. No visual field deficit or scleral icterus.    ?   Right eye: No discharge.     ?   Left eye: No discharge.  ?   Extraocular Movements: Extraocular movements intact.  ?   Right eye: Normal extraocular motion and no nystagmus.  ?   Left eye: Normal extraocular motion and no nystagmus.  ?   Conjunctiva/sclera: Conjunctivae normal.  ?   Pupils: Pupils are equal, round, and reactive to light.  ?Neck:  ?   Trachea: Trachea and phonation normal. No tracheal deviation.  ?Cardiovascular:  ?   Rate and Rhythm: Normal rate and regular rhythm.  ?   Pulses: Normal pulses.     ?     Radial pulses are 2+ on the right side and 2+ on the left side.  ?Pulmonary:  ?   Effort: Pulmonary effort is normal. No respiratory  distress.  ?   Breath sounds: Normal breath sounds and air entry. No stridor or transmitted upper airway sounds. No wheezing.  ?   Comments: Spoke full sentences without difficulty; no cough observed in exam room ?Abdominal:  ?   General: Abdomen is flat. There is no distension.  ?   Palpations: Abdomen is soft.  ?Musculoskeletal:     ?   General: No signs of injury. Normal range of motion.  ?   Right shoulder: No deformity or crepitus. Normal strength.  ?   Left shoulder: No deformity or crepitus. Normal strength.  ?   Right  elbow: No deformity, effusion or lacerations.  ?   Left elbow: No deformity, effusion or lacerations.  ?   Right forearm: No edema, deformity or lacerations.  ?   Left forearm: No edema, deformity or lacerations.  ?   Right wrist: No deformity, effusion, lacerations or crepitus.  ?   Left wrist: No deformity, effusion, lacerations or crepitus.  ?   Right hand: Swelling present. No deformity or lacerations. Normal strength. Normal capillary refill.  ?   Left hand: Swelling present. No deformity or lacerations. Normal strength. Normal capillary refill.  ?   Cervical back: Normal range of motion and neck supple. No swelling, edema, deformity, erythema, signs of trauma, lacerations, rigidity or crepitus. No pain with movement. Normal range of motion.  ?   Thoracic back: No swelling, edema, deformity, signs of trauma or lacerations.  ?   Lumbar back: No swelling, edema, deformity, signs of trauma or lacerations.  ?   Right knee: Swelling present. No deformity, erythema, ecchymosis, lacerations or crepitus.  ?   Left knee: Swelling present. No deformity, erythema, ecchymosis, lacerations or crepitus.  ?   Right lower leg: No edema.  ?   Left lower leg: No edema.  ?   Comments: 0-1+/4 nonpitting edema bilateral hands/knees  ?Lymphadenopathy:  ?   Head:  ?   Right side of head: No submental, submandibular, tonsillar, preauricular, posterior auricular or occipital adenopathy.  ?   Left side of head: No submental, submandibular, tonsillar, preauricular, posterior auricular or occipital adenopathy.  ?   Cervical: No cervical adenopathy.  ?   Right cervical: No superficial cervical adenopathy. ?   Left cervical: No superficial cervical adenopathy.  ?Skin: ?   General: Skin is warm and dry.  ?   Capillary Refill: Capillary refill takes less than 2 seconds.  ?   Coloration: Skin is not ashen, cyanotic, jaundiced, mottled, pale or sallow.  ?   Findings: No abrasion, abscess, acne, bruising, burn, ecchymosis, erythema, signs of  injury, laceration, lesion, petechiae, rash or wound.  ?   Nails: There is no clubbing.  ?Neurological:  ?   General: No focal deficit present.  ?   Mental Status: She is alert and oriented to person, place, and time. Mental status is at baseline.  ?   GCS: GCS eye subscore is 4. GCS verbal subscore is 5. GCS motor subscore is 6.  ?   Cranial Nerves: No cranial nerve deficit, dysarthria or facial asymmetry.  ?   Sensory: Sensation is intact.  ?   Motor: Motor function is intact. No weakness, tremor, atrophy, abnormal muscle tone or seizure activity.  ?   Coordination: Coordination is intact. Coordination normal.  ?   Gait: Gait is intact. Gait normal.  ?   Comments: In/out of chair without difficulty; gait sure and steady in clinic; bilateral hand grasp equal 5/5  ?  Psychiatric:     ?   Attention and Perception: Attention and perception normal.     ?   Mood and Affect: Mood and affect normal.     ?   Speech: Speech normal.     ?   Behavior: Behavior normal. Behavior is cooperative.     ?   Thought Content: Thought content normal.     ?   Cognition and Memory: Cognition and memory normal.     ?   Judgment: Judgment normal.  ? ? ? ? Latest Reference Range & Units 07/05/21 09:17  ?Sodium 134 - 144 mmol/L 138  ?Potassium 3.5 - 5.2 mmol/L 4.1  ?Chloride 96 - 106 mmol/L 102  ?Glucose 70 - 99 mg/dL 92  ?BUN 6 - 24 mg/dL 8  ?Creatinine 0.57 - 1.00 mg/dL 0.71  ?Calcium 8.7 - 10.2 mg/dL 9.9  ?BUN/Creatinine Ratio 9 - 23  11  ?eGFR >59 mL/min/1.73 98  ?Phosphorus 3.0 - 4.3 mg/dL 3.6  ?Magnesium 1.6 - 2.3 mg/dL 2.1  ?Alkaline Phosphatase 44 - 121 IU/L 47  ?Albumin 3.8 - 4.9 g/dL 4.8  ?Albumin/Globulin Ratio 1.2 - 2.2  2.4 (H)  ?Uric Acid 3.0 - 7.2 mg/dL 2.9 (L)  ?AST 0 - 40 IU/L 13  ?ALT 0 - 32 IU/L 18  ?Total Protein 6.0 - 8.5 g/dL 6.8  ?Total Bilirubin 0.0 - 1.2 mg/dL 0.4  ?GGT 0 - 60 IU/L 10  ?Estimated CHD Risk 0.0 - 1.0 times avg. 1.7 (H)  ?LDH 119 - 226 IU/L 166  ?Total CHOL/HDL Ratio 0.0 - 4.4 ratio 5.9 (H)  ?Cholesterol,  Total 100 - 199 mg/dL 348 (H)  ?HDL Cholesterol >39 mg/dL 59  ?Triglycerides 0 - 149 mg/dL 162 (H)  ?VLDL Cholesterol Cal 5 - 40 mg/dL 31  ?LDL Chol Calc (NIH) 0 - 99 mg/dL 258 (H)  ?Lipid Comment:  Comment  ?I

## 2022-01-01 NOTE — Patient Instructions (Signed)
Systemic Lupus Erythematosus, Adult ?Systemic lupus erythematosus (SLE) is a long-term (chronic) disease that can affect many parts of the body. SLE is an autoimmune disease. With this type of disease, the body's defense system (immune system) mistakenly attacks healthy tissues. This can cause damage to the skin, joints, blood vessels, brain, kidneys, lungs, heart, and other internal organs. It causes pain, irritation, and inflammation. ?What are the causes? ?The cause of this condition is not known. ?What increases the risk? ?The following factors may make you more likely to develop this condition: ?Being female. ?Being of Asian, Hispanic, or African American descent. ?Having a family history of the condition. ?Being exposed to tobacco smoke or smoking cigarettes. ?Having an infection with a virus, such as Epstein-Barr virus. ?Having a history of exposure to silica dust, metals, chemicals, mold or mildew, or insecticides. ?Using oral contraceptives or hormone replacement therapy. ?What are the signs or symptoms? ?This condition can affect almost any organ or system in the body. Symptoms of the condition depend on which organ or system is affected. ?The most common symptoms include: ?Fever. ?Tiredness (fatigue). ?Weight loss. ?Muscle aches. ?Joint pain. ?Skin rashes, especially over the nose and cheeks (butterfly rash) and after sun exposure. ?Symptoms can come and go. A period of time when symptoms get worse or come back is called a flare. A period of time with no symptoms is called a remission. ?How is this diagnosed? ?This condition is diagnosed based on: ?Your symptoms. ?Your medical history. ?A physical exam. ?You may also have tests, including: ?Blood tests. ?Urine tests. ?A chest X-ray. ?You may be referred to an autoimmune disease specialist (rheumatologist). ?How is this treated? ?There is no cure for this condition, but treatment can help to control symptoms, prevent flares (keep symptoms in remission),  and prevent damage to the heart, lungs, kidneys, and other organs. Treatment will depend on what symptoms you are having and what organs or systems are affected. Treatment may involve taking a combination of medicines over time. ?Common medicines used to treat this condition include: ?Antimalarial medicines to control symptoms, prevent flares, and protect against organ damage. ?Corticosteroids and NSAIDs to reduce inflammation. ?Medicines to weaken your immune system (immunosuppressants). ?Biologic response modifiers to reduce inflammation and damage. ?Follow these instructions at home: ?Medicines ?Take over-the-counter and prescription medicines only as told by your health care provider. ?Do not take any medicines that contain estrogen without first checking with your health care provider. Estrogen can trigger flares and may increase your risk for blood clots. ?Eating and drinking ?Eat a heart-healthy diet. This may include: ?Eating high-fiber foods, such as beans, whole grains, and fresh fruits and vegetables. ?Eating heart-healthy fats (omega-3 fats), such as fish, flaxseed, and flaxseed oil. ?Limiting foods that are high in saturated fat and cholesterol, such as processed and fried foods, fatty meat, and full-fat dairy. ?Limiting how much salt (sodium) you eat. ?Include calcium and vitamin D in your diet. Good sources of calcium and vitamin D include: ?Low-fat dairy products such as milk, yogurt, and cheese. ?Certain fish, such as fresh or canned salmon, tuna, and sardines. ?Products that have calcium and vitamin D added to them (are fortified), such as fortified cereals or juice. ?Lifestyle ? ?  ? ?Stay active, as directed by your health care provider. ?Do not use any products that contain nicotine or tobacco. These products include cigarettes, chewing tobacco, and vaping devices, such as e-cigarettes. If you need help quitting, ask your health care provider. ?Protect your skin from the sun by  applying sunblock  and wearing protective hats and clothing. ?Learn as much as you can about your condition and have a good support system in place. Support may come from family, friends, or a lupus support group. ?General instructions ?Work closely with all of your health care providers to manage your condition. ?Stay up to date on all vaccines as told by your health care provider. ?Keep all follow-up visits. This is important. ?Contact a health care provider if: ?You have a fever. ?Your symptoms flare. ?You develop new symptoms. ?You have an upper respiratory infection. ?You have bloody, foamy, or coffee-colored urine. ?There are changes in your urination. For example, you urinate more often at night. ?You think that you may be depressed or have anxiety. ?You become pregnant or plan to become pregnant. Pregnancy in women with this condition is considered high risk. ?Get help right away if: ?You have chest pain. ?You have trouble breathing. ?You have a seizure. ?You suddenly get a very bad headache. ?You suddenly develop facial or body weakness. ?You cannot speak. ?You cannot understand speech. ?These symptoms may be an emergency. Get help right away. Call 911. ?Do not wait to see if the symptoms will go away. ?Do not drive yourself to the hospital. ?Summary ?Systemic lupus erythematosus (SLE) is a long-term disease that can affect many parts of the body. ?SLE is an autoimmune disease. That means your body's defense system (immune system) mistakenly attacks healthy tissues. ?There is no cure for this condition, but treatment can help to control symptoms, prevent flares, and prevent damage to your organs. Treatment may involve taking a combination of medicines over time. ?This information is not intended to replace advice given to you by your health care provider. Make sure you discuss any questions you have with your health care provider. ?Document Revised: 05/18/2021 Document Reviewed: 05/18/2021 ?[employer]vier Patient Education ? 2023  [employer] ?Antinuclear Antibody Test ?Why am I having this test? ?This is a test that is used to help diagnose systemic lupus erythematosus (SLE) and other autoimmune diseases. An autoimmune disease is a disease in which the body's own defense system (immune system) attacks its organs. ?What is being tested? ?This test checks for antinuclear antibodies (ANA) in the blood. The presence of ANA is associated with several autoimmune diseases. It is seen in almost all people with lupus. ?What kind of sample is taken? ? ?A blood sample is required for this test. It is usually collected by inserting a needle into a blood vessel. ?How are the results reported? ?Your test results will be reported as either positive or negative. ?What do the results mean? ?A positive test result may mean that you have: ?Lupus. ?Other autoimmune diseases, such as rheumatoid arthritis, scleroderma, or Sj?gren syndrome. ?Talk with your health care provider about what your results mean. In some cases, your health care provider may do more testing to confirm the results. More testing may be done because other conditions can sometimes cause a positive result, such as: ?Liver dysfunction. ?Myasthenia gravis. ?Infectious mononucleosis. ?Questions to ask your health care provider ?Ask your health care provider, or the department that is doing the test: ?When will my results be ready? ?How will I get my results? ?What are my treatment options? ?What other tests do I need? ?What are my next steps? ?Summary ?This is a test that is used to help diagnose systemic lupus erythematosus (SLE) and other autoimmune diseases. An autoimmune disease is a disease in which the body's own defense system (immune system) attacks  the body. ?This test checks for antinuclear antibodies (ANA) in the blood. The presence of ANA is associated with several autoimmune diseases. It is seen in almost all people with lupus. ?Your test results will be reported as either positive  or negative. Talk with your health care provider about what your results mean. ?This information is not intended to replace advice given to you by your health care provider. Make sure you discuss any q

## 2022-01-03 ENCOUNTER — Telehealth: Payer: Self-pay | Admitting: Registered Nurse

## 2022-01-03 ENCOUNTER — Encounter: Payer: Self-pay | Admitting: Registered Nurse

## 2022-01-03 DIAGNOSIS — E782 Mixed hyperlipidemia: Secondary | ICD-10-CM

## 2022-01-03 DIAGNOSIS — M2550 Pain in unspecified joint: Secondary | ICD-10-CM

## 2022-01-03 MED ORDER — ATORVASTATIN CALCIUM 20 MG PO TABS: 20.0000 mg | ORAL_TABLET | Freq: Every day | ORAL | 0 refills | Status: DC

## 2022-01-03 MED ORDER — PREDNISONE 10 MG PO TABS: ORAL_TABLET | ORAL | 0 refills | Status: AC

## 2022-01-03 NOTE — Telephone Encounter (Signed)
Patient given lab results by RN Rolly Salter agreed to start statin and will draw nonfasting LFTs in 4 weeks.  Patient to notify clinic staff if myalgia.  Swelling a little improved with NSAID but still having joint aches would like trial of prednisone taper.  Dispensed from [facility] to patient today 30mg x2 days 20mg x2days 10mg x2 days 5mg x2 days #21 RF0.  Patient had no further questions or concerns at this time. ?

## 2022-01-03 NOTE — Progress Notes (Signed)
Reviewed RN Rolly Salter note see tcon dated today patient initiated on atorvastatin 20mg  po daily and dispensed from [facility] to patient with labs in 4 weeks LFTs and prednisone 30mg  taper initiated and dispensed from [facility] to patient today. ?

## 2022-01-03 NOTE — Progress Notes (Signed)
Reviewed results with pt per NP notes. Hand/finger swelling and pain still present but improved some from Tuesday. Pain in elbows, hips, knees unchanged. She is amenable to trying a prednisone taper. For statin recommendation, she has previously been on Pravastatin which she stopped due to myalgias. She has a friend on atorvastatin without issues and would be willing to start that. Hard copy of results given and handouts provided on cholesterol, life style modifications for cholesterol, and high fiber diet.

## 2022-01-04 LAB — CMP12+LP+TP+TSH+6AC+CBC/D/PLT
ALT: 14 IU/L (ref 0–32)
AST: 12 IU/L (ref 0–40)
Albumin/Globulin Ratio: 2.3 — ABNORMAL HIGH (ref 1.2–2.2)
Albumin: 4.4 g/dL (ref 3.8–4.9)
Alkaline Phosphatase: 48 IU/L (ref 44–121)
BUN/Creatinine Ratio: 14 (ref 12–28)
BUN: 11 mg/dL (ref 8–27)
Basophils Absolute: 0 10*3/uL (ref 0.0–0.2)
Basos: 0 %
Bilirubin Total: 0.2 mg/dL (ref 0.0–1.2)
Calcium: 9.5 mg/dL (ref 8.7–10.3)
Chloride: 102 mmol/L (ref 96–106)
Chol/HDL Ratio: 6.4 ratio — ABNORMAL HIGH (ref 0.0–4.4)
Cholesterol, Total: 283 mg/dL — ABNORMAL HIGH (ref 100–199)
Creatinine, Ser: 0.76 mg/dL (ref 0.57–1.00)
EOS (ABSOLUTE): 0.1 10*3/uL (ref 0.0–0.4)
Eos: 1 %
Estimated CHD Risk: 1.8 times avg. — ABNORMAL HIGH (ref 0.0–1.0)
Free Thyroxine Index: 2.1 (ref 1.2–4.9)
GGT: 11 IU/L (ref 0–60)
Globulin, Total: 1.9 g/dL (ref 1.5–4.5)
Glucose: 122 mg/dL — ABNORMAL HIGH (ref 70–99)
HDL: 44 mg/dL (ref >39)
Hematocrit: 39.3 % (ref 34.0–46.6)
Hemoglobin: 13.4 g/dL (ref 11.1–15.9)
Immature Grans (Abs): 0 10*3/uL (ref 0.0–0.1)
Immature Granulocytes: 0 %
Iron: 65 ug/dL (ref 27–159)
LDH: 180 IU/L (ref 119–226)
LDL Chol Calc (NIH): 192 mg/dL — ABNORMAL HIGH (ref 0–99)
Lymphocytes Absolute: 2.6 10*3/uL (ref 0.7–3.1)
Lymphs: 34 %
MCH: 33.9 pg — ABNORMAL HIGH (ref 26.6–33.0)
MCHC: 34.1 g/dL (ref 31.5–35.7)
MCV: 100 fL — ABNORMAL HIGH (ref 79–97)
Monocytes Absolute: 0.3 10*3/uL (ref 0.1–0.9)
Monocytes: 4 %
Neutrophils Absolute: 4.7 10*3/uL (ref 1.4–7.0)
Neutrophils: 61 %
Phosphorus: 3 mg/dL (ref 3.0–4.3)
Platelets: 338 10*3/uL (ref 150–450)
Potassium: 4 mmol/L (ref 3.5–5.2)
RBC: 3.95 x10E6/uL (ref 3.77–5.28)
RDW: 11.8 % (ref 11.7–15.4)
Sodium: 138 mmol/L (ref 134–144)
T3 Uptake Ratio: 24 % (ref 24–39)
T4, Total: 8.6 ug/dL (ref 4.5–12.0)
TSH: 0.506 u[IU]/mL (ref 0.450–4.500)
Total Protein: 6.3 g/dL (ref 6.0–8.5)
Triglycerides: 244 mg/dL — ABNORMAL HIGH (ref 0–149)
Uric Acid: 3.4 mg/dL (ref 3.0–7.2)
VLDL Cholesterol Cal: 47 mg/dL — ABNORMAL HIGH (ref 5–40)
WBC: 7.6 10*3/uL (ref 3.4–10.8)
eGFR: 90 mL/min/{1.73_m2} (ref >59)

## 2022-01-04 LAB — SEDIMENTATION RATE: Sed Rate: 10 mm/hr (ref 0–40)

## 2022-01-04 LAB — VITAMIN D 25 HYDROXY (VIT D DEFICIENCY, FRACTURES): Vit D, 25-Hydroxy: 39.1 ng/mL (ref 30.0–100.0)

## 2022-01-04 LAB — C-REACTIVE PROTEIN: CRP: <1 mg/L (ref 0–10)

## 2022-01-04 LAB — ANTINUCLEAR ANTIBODIES, IFA: ANA Titer 1: NEGATIVE

## 2022-01-04 LAB — HGB A1C W/O EAG: Hgb A1c MFr Bld: 5.3 % (ref 4.8–5.6)

## 2022-01-14 ENCOUNTER — Other Ambulatory Visit: Payer: Self-pay | Admitting: *Deleted

## 2022-01-14 MED ORDER — BENZONATATE 200 MG PO CAPS: 200.0000 mg | ORAL_CAPSULE | Freq: Three times a day (TID) | ORAL | 2 refills | Status: DC | PRN

## 2022-01-17 ENCOUNTER — Encounter: Payer: Self-pay | Admitting: Registered Nurse

## 2022-01-17 ENCOUNTER — Ambulatory Visit: Payer: Self-pay | Admitting: Registered Nurse

## 2022-01-17 VITALS — BP 116/80 | HR 71 | Temp 98.0°F

## 2022-01-17 DIAGNOSIS — H6692 Otitis media, unspecified, left ear: Secondary | ICD-10-CM

## 2022-01-17 DIAGNOSIS — J209 Acute bronchitis, unspecified: Secondary | ICD-10-CM

## 2022-01-17 DIAGNOSIS — F17210 Nicotine dependence, cigarettes, uncomplicated: Secondary | ICD-10-CM

## 2022-01-17 DIAGNOSIS — J019 Acute sinusitis, unspecified: Secondary | ICD-10-CM

## 2022-01-17 MED ORDER — MONTELUKAST SODIUM 10 MG PO TABS: ORAL_TABLET | ORAL | 3 refills | Status: DC

## 2022-01-17 MED ORDER — AMOXICILLIN-POT CLAVULANATE 875-125 MG PO TABS: 1.0000 | ORAL_TABLET | Freq: Two times a day (BID) | ORAL | 0 refills | Status: AC

## 2022-01-17 MED ORDER — PROMETHAZINE HCL 6.25 MG/5ML PO SYRP: 6.2500 mg | ORAL_SOLUTION | Freq: Four times a day (QID) | ORAL | 0 refills | Status: AC | PRN

## 2022-01-17 MED ORDER — PREDNISONE 10 MG PO TABS: 20.0000 mg | ORAL_TABLET | Freq: Every day | ORAL | 0 refills | Status: AC

## 2022-01-17 MED ORDER — SALINE SPRAY 0.65 % NA SOLN: 2.0000 | NASAL | 0 refills | Status: DC

## 2022-01-17 NOTE — Progress Notes (Signed)
61y/o Caucasian established female pt c/o cough. Sts chronic dry cough but past 4 days has worsened and become productive at times. Thick yellow phlegm. Has tried Tessalon without success.

## 2022-01-17 NOTE — Patient Instructions (Signed)
Otitis Media, Adult  Otitis media occurs when there is inflammation and fluid in the middle ear with signs and symptoms of an acute infection. The middle ear is a part of the ear that contains bones for hearing as well as air that helps send sounds to the brain. When infected fluid builds up in this space, it causes pressure and can lead to an ear infection. The eustachian tube connects the middle ear to the back of the nose (nasopharynx) and normally allows air into the middle ear. If the eustachian tube becomes blocked, fluid can build up and become infected. What are the causes? This condition is caused by a blockage in the eustachian tube. This can be caused by mucus or by swelling of the tube. Problems that can cause a blockage include: A cold or other upper respiratory infection. Allergies. An irritant, such as tobacco smoke. Enlarged adenoids. The adenoids are areas of soft tissue located high in the back of the throat, behind the nose and the roof of the mouth. They are part of the body's defense system (immune system). A mass in the nasopharynx. Damage to the ear caused by pressure changes (barotrauma). What increases the risk? You are more likely to develop this condition if you: Smoke or are exposed to tobacco smoke. Have an opening in the roof of your mouth (cleft palate). Have gastroesophageal reflux. Have an immune system disorder. What are the signs or symptoms? Symptoms of this condition include: Ear pain. Fever. Decreased hearing. Tiredness (lethargy). Fluid leaking from the ear, if the eardrum is ruptured or has burst. Ringing in the ear. How is this diagnosed?  This condition is diagnosed with a physical exam. During the exam, your health care provider will use an instrument called an otoscope to look in your ear and check for redness, swelling, and fluid. He or she will also ask about your symptoms. Your health care provider may also order tests, such as: A pneumatic  otoscopy. This is a test to check the movement of the eardrum. It is done by squeezing a small amount of air into the ear. A tympanogram. This is a test that shows how well the eardrum moves in response to air pressure in the ear canal. It provides a graph for your health care provider to review. How is this treated? This condition can go away on its own within 3-5 days. But if the condition is caused by a bacterial infection and does not go away on its own, or if it keeps coming back, your health care provider may: Prescribe antibiotic medicine to treat the infection. Prescribe or recommend medicines to control pain. Follow these instructions at home: Take over-the-counter and prescription medicines only as told by your health care provider. If you were prescribed an antibiotic medicine, take it as told by your health care provider. Do not stop taking the antibiotic even if you start to feel better. Keep all follow-up visits. This is important. Contact a health care provider if: You have bleeding from your nose. There is a lump on your neck. You are not feeling better in 5 days. You feel worse instead of better. Get help right away if: You have severe pain that is not controlled with medicine. You have swelling, redness, or pain around your ear. You have stiffness in your neck. A part of your face is not moving (paralyzed). The bone behind your ear (mastoid bone) is tender when you touch it. You develop a severe headache. Summary Otitis media   is redness, soreness, and swelling of the middle ear, usually resulting in pain and decreased hearing. This condition can go away on its own within 3-5 days. If the problem does not go away in 3-5 days, your health care provider may give you medicines to treat the infection. If you were prescribed an antibiotic medicine, take it as told by your health care provider. Follow all instructions that were given to you by your health care provider. This  information is not intended to replace advice given to you by your health care provider. Make sure you discuss any questions you have with your health care provider. Document Revised: 11/20/2020 Document Reviewed: 11/20/2020 Elsevier Patient Education  2023 [employer] How to Perform a Sinus Rinse A sinus rinse is a home treatment that is used to rinse your sinuses with a germ-free (sterile) mixture of salt and water (saline solution). Sinuses are air-filled spaces in your skull that are behind the bones of your face and forehead. They open into your nasal cavity. A sinus rinse can help to clear mucus, dirt, dust, or pollen from your nasal cavity. You may do a sinus rinse when you have a cold, a virus, nasal allergy symptoms, a sinus infection, or stuffiness in your nose or sinuses. What are the risks? A sinus rinse is generally safe and effective. However, there are a few risks, which include: A burning sensation in your sinuses. This may happen if you do not make the saline solution as directed. Be sure to follow all directions when making the saline solution. Nasal irritation. Infection. This may be from unclean supplies or from contaminated water. Infection from contaminated water is rare, but possible. Do not do a sinus rinse if you have had ear or nasal surgery, ear infection, or plugged ears, unless recommended by your health care provider. Supplies needed: Saline solution or powder. Distilled or sterile water to mix with saline powder. You may use boiled and cooled tap water. Boil tap water for 5 minutes; cool until it is lukewarm. Use within 24 hours. Do not use regular tap water to mix with the saline solution. Neti pot or nasal rinse bottle. These supplies release the saline solution into your nose and through your sinuses. Neti pots and nasal rinse bottles can be purchased at [facility] local [facility], a health food store, or online. How to perform a sinus rinse  Wash your hands with  soap and water for at least 20 seconds. If soap and water are not available, use hand sanitizer. Wash your device according to the directions that came with the product and then dry it. Use the solution that comes with your product or one that is sold separately in stores. Follow the mixing directions on the package to mix with sterile or distilled water. Fill the device with the amount of saline solution noted in the device instructions. Stand by a sink and tilt your head sideways over the sink. Place the spout of the device in your upper nostril (the one closer to the ceiling). Gently pour or squeeze the saline solution into your nasal cavity. The liquid should drain out from the lower nostril if you are not too congested. While rinsing, breathe through your open mouth. Gently blow your nose to clear any mucus and rinse solution. Blowing too hard may cause ear pain. Turn your head in the other direction and repeat in your other nostril. Clean and rinse your device with clean water and then air-dry it. Talk with your health  care provider or pharmacist if you have questions about how to do a sinus rinse. Summary A sinus rinse is a home treatment that is used to rinse your sinuses with a sterile mixture of salt and water (saline solution). You may do a sinus rinse when you have a cold, a virus, nasal allergy symptoms, a sinus infection, or stuffiness in your nose or sinuses. A sinus rinse is generally safe and effective. Follow all instructions carefully. This information is not intended to replace advice given to you by your health care provider. Make sure you discuss any questions you have with your health care provider. Document Revised: 01/29/2021 Document Reviewed: 01/29/2021 Elsevier Patient Education  2023 [employer] Sinus Infection, Adult A sinus infection, also called sinusitis, is inflammation of your sinuses. Sinuses are hollow spaces in the bones around your face. Your sinuses are  located: Around your eyes. In the middle of your forehead. Behind your nose. In your cheekbones. Mucus normally drains out of your sinuses. When your nasal tissues become inflamed or swollen, mucus can become trapped or blocked. This allows bacteria, viruses, and fungi to grow, which leads to infection. Most infections of the sinuses are caused by a virus. A sinus infection can develop quickly. It can last for up to 4 weeks (acute) or for more than 12 weeks (chronic). A sinus infection often develops after a cold. What are the causes? This condition is caused by anything that creates swelling in the sinuses or stops mucus from draining. This includes: Allergies. Asthma. Infection from bacteria or viruses. Deformities or blockages in your nose or sinuses. Abnormal growths in the nose (nasal polyps). Pollutants, such as chemicals or irritants in the air. Infection from fungi. This is rare. What increases the risk? You are more likely to develop this condition if you: Have a weak body defense system (immune system). Do a lot of swimming or diving. Overuse nasal sprays. Smoke. What are the signs or symptoms? The main symptoms of this condition are pain and a feeling of pressure around the affected sinuses. Other symptoms include: Stuffy nose or congestion that makes it difficult to breathe through your nose. Thick yellow or greenish drainage from your nose. Tenderness, swelling, and warmth over the affected sinuses. A cough that may get worse at night. Decreased sense of smell and taste. Extra mucus that collects in the throat or the back of the nose (postnasal drip) causing a sore throat or bad breath. Tiredness (fatigue). Fever. How is this diagnosed? This condition is diagnosed based on: Your symptoms. Your medical history. A physical exam. Tests to find out if your condition is acute or chronic. This may include: Checking your nose for nasal polyps. Viewing your sinuses using a  device that has a light (endoscope). Testing for allergies or bacteria. Imaging tests, such as an MRI or CT scan. In rare cases, a bone biopsy may be done to rule out more serious types of fungal sinus disease. How is this treated? Treatment for a sinus infection depends on the cause and whether your condition is chronic or acute. If caused by a virus, your symptoms should go away on their own within 10 days. You may be given medicines to relieve symptoms. They include: Medicines that shrink swollen nasal passages (decongestants). A spray that eases inflammation of the nostrils (topical intranasal corticosteroids). Rinses that help get rid of thick mucus in your nose (nasal saline washes). Medicines that treat allergies (antihistamines). Over-the-counter pain relievers. If caused by bacteria, your health  care provider may recommend waiting to see if your symptoms improve. Most bacterial infections will get better without antibiotic medicine. You may be given antibiotics if you have: A severe infection. A weak immune system. If caused by narrow nasal passages or nasal polyps, surgery may be needed. Follow these instructions at home: Medicines Take, use, or apply over-the-counter and prescription medicines only as told by your health care provider. These may include nasal sprays. If you were prescribed an antibiotic medicine, take it as told by your health care provider. Do not stop taking the antibiotic even if you start to feel better. Hydrate and humidify  Drink enough fluid to keep your urine pale yellow. Staying hydrated will help to thin your mucus. Use a cool mist humidifier to keep the humidity level in your home above 50%. Inhale steam for 10-15 minutes, 3-4 times a day, or as told by your health care provider. You can do this in the bathroom while a hot shower is running. Limit your exposure to cool or dry air. Rest Rest as much as possible. Sleep with your head raised  (elevated). Make sure you get enough sleep each night. General instructions  Apply a warm, moist washcloth to your face 3-4 times a day or as told by your health care provider. This will help with discomfort. Use nasal saline washes as often as told by your health care provider. Wash your hands often with soap and water to reduce your exposure to germs. If soap and water are not available, use hand sanitizer. Do not smoke. Avoid being around people who are smoking (secondhand smoke). Keep all follow-up visits. This is important. Contact a health care provider if: You have a fever. Your symptoms get worse. Your symptoms do not improve within 10 days. Get help right away if: You have a severe headache. You have persistent vomiting. You have severe pain or swelling around your face or eyes. You have vision problems. You develop confusion. Your neck is stiff. You have trouble breathing. These symptoms may be an emergency. Get help right away. Call 911. Do not wait to see if the symptoms will go away. Do not drive yourself to the hospital. Summary A sinus infection is soreness and inflammation of your sinuses. Sinuses are hollow spaces in the bones around your face. This condition is caused by nasal tissues that become inflamed or swollen. The swelling traps or blocks the flow of mucus. This allows bacteria, viruses, and fungi to grow, which leads to infection. If you were prescribed an antibiotic medicine, take it as told by your health care provider. Do not stop taking the antibiotic even if you start to feel better. Keep all follow-up visits. This is important. This information is not intended to replace advice given to you by your health care provider. Make sure you discuss any questions you have with your health care provider. Document Revised: 07/17/2021 Document Reviewed: 07/17/2021 Elsevier Patient Education  2023 [employer] Cough, Adult Coughing is a reflex that clears your  throat and your airways (respiratory system). Coughing helps to heal and protect your lungs. It is normal to cough occasionally, but a cough that happens with other symptoms or lasts a long time may be a sign of a condition that needs treatment. An acute cough may only last 2-3 weeks, while a chronic cough may last 8 or more weeks. Coughing is commonly caused by: Infection of the respiratory systemby viruses or bacteria. Breathing in substances that irritate your lungs. Allergies. Asthma.  Mucus that runs down the back of your throat (postnasal drip). Smoking. Acid backing up from the stomach into the esophagus (gastroesophageal reflux). Certain medicines. Chronic lung problems. Other medical conditions such as heart failure or a blood clot in the lung (pulmonary embolism). Follow these instructions at home: Medicines Take over-the-counter and prescription medicines only as told by your health care provider. Talk with your health care provider before you take a cough suppressant medicine. Lifestyle  Avoid cigarette smoke. Do not use any products that contain nicotine or tobacco, such as cigarettes, e-cigarettes, and chewing tobacco. If you need help quitting, ask your health care provider. Drink enough fluid to keep your urine pale yellow. Avoid caffeine. Do not drink alcohol if your health care provider tells you not to drink. General instructions  Pay close attention to changes in your cough. Tell your health care provider about them. Always cover your mouth when you cough. Avoid things that make you cough, such as perfume, candles, cleaning products, or campfire or tobacco smoke. If the air is dry, use a cool mist vaporizer or humidifier in your bedroom or your home to help loosen secretions. If your cough is worse at night, try to sleep in a semi-upright position. Rest as needed. Keep all follow-up visits as told by your health care provider. This is important. Contact a health care  provider if you: Have new symptoms. Cough up pus. Have a cough that does not get better after 2-3 weeks or gets worse. Cannot control your cough with cough suppressant medicines and you are losing sleep. Have pain that gets worse or pain that is not helped with medicine. Have a fever. Have unexplained weight loss. Have night sweats. Get help right away if: You cough up blood. You have difficulty breathing. Your heartbeat is very fast. These symptoms may represent a serious problem that is an emergency. Do not wait to see if the symptoms will go away. Get medical help right away. Call your local emergency services (911 in the [place] Do not drive yourself to the hospital. Summary Coughing is a reflex that clears your throat and your airways. It is normal to cough occasionally, but a cough that happens with other symptoms or lasts a long time may be a sign of a condition that needs treatment. Take over-the-counter and prescription medicines only as told by your health care provider. Always cover your mouth when you cough. Contact a health care provider if you have new symptoms or a cough that does not get better after 2-3 weeks or gets worse. This information is not intended to replace advice given to you by your health care provider. Make sure you discuss any questions you have with your health care provider. Document Revised: 08/31/2018 Document Reviewed: 08/31/2018 Elsevier Patient Education  2023 [employer] Acute Bronchitis, Adult  Acute bronchitis is sudden inflammation of the main airways (bronchi) that come off the windpipe (trachea) in the lungs. The swelling causes the airways to get smaller and make more mucus than normal. This can make it hard to breathe and can cause coughing or noisy breathing (wheezing). Acute bronchitis may last several weeks. The cough may last longer. Allergies, asthma, and exposure to smoke may make the condition worse. What are the causes? This  condition can be caused by germs and by substances that irritate the lungs, including: Cold and flu viruses. The most common cause of this condition is the virus that causes the common cold. Bacteria. This is less common. Breathing in  substances that irritate the lungs, including: Smoke from cigarettes and other forms of tobacco. Dust and pollen. Fumes from household cleaning products, gases, or burned fuel. Indoor or outdoor air pollution. What increases the risk? The following factors may make you more likely to develop this condition: A weak body's defense system, also called the immune system. A condition that affects your lungs and breathing, such as asthma. What are the signs or symptoms? Common symptoms of this condition include: Coughing. This may bring up clear, yellow, or green mucus from your lungs (sputum). Wheezing. Runny or stuffy nose. Having too much mucus in your lungs (chest congestion). Shortness of breath. Aches and pains, including sore throat or chest. How is this diagnosed? This condition is usually diagnosed based on: Your symptoms and medical history. A physical exam. You may also have other tests, including tests to rule out other conditions, such as pneumonia. These tests include: A test of lung function. Test of a mucus sample to look for the presence of bacteria. Tests to check the oxygen level in your blood. Blood tests. Chest X-ray. How is this treated? Most cases of acute bronchitis clear up over time without treatment. Your health care provider may recommend: Drinking more fluids to help thin your mucus so it is easier to cough up. Taking inhaled medicine (inhaler) to improve air flow in and out of your lungs. Using a vaporizer or a humidifier. These are machines that add water to the air to help you breathe better. Taking a medicine that thins mucus and clears congestion (expectorant). Taking a medicine that prevents or stops coughing (cough  suppressant). It is notcommon to take an antibiotic medicine for this condition. Follow these instructions at home:  Take over-the-counter and prescription medicines only as told by your health care provider. Use an inhaler, vaporizer, or humidifier as told by your health care provider. Take two teaspoons (10 mL) of honey at bedtime to lessen coughing at night. Drink enough fluid to keep your urine pale yellow. Do not use any products that contain nicotine or tobacco. These products include cigarettes, chewing tobacco, and vaping devices, such as e-cigarettes. If you need help quitting, ask your health care provider. Get plenty of rest. Return to your normal activities as told by your health care provider. Ask your health care provider what activities are safe for you. Keep all follow-up visits. This is important. How is this prevented? To lower your risk of getting this condition again: Wash your hands often with soap and water for at least 20 seconds. If soap and water are not available, use hand sanitizer. Avoid contact with people who have cold symptoms. Try not to touch your mouth, nose, or eyes with your hands. Avoid breathing in smoke or chemical fumes. Breathing smoke or chemical fumes will make your condition worse. Get the flu shot every year. Contact a health care provider if: Your symptoms do not improve after 2 weeks. You have trouble coughing up the mucus. Your cough keeps you awake at night. You have a fever. Get help right away if you: Cough up blood. Feel pain in your chest. Have severe shortness of breath. Faint or keep feeling like you are going to faint. Have a severe headache. Have a fever or chills that get worse. These symptoms may represent a serious problem that is an emergency. Do not wait to see if the symptoms will go away. Get medical help right away. Call your local emergency services (911 in the [place] Do not  drive yourself to the  hospital. Summary Acute bronchitis is inflammation of the main airways (bronchi) that come off the windpipe (trachea) in the lungs. The swelling causes the airways to get smaller and make more mucus than normal. Drinking more fluids can help thin your mucus so it is easier to cough up. Take over-the-counter and prescription medicines only as told by your health care provider. Do not use any products that contain nicotine or tobacco. These products include cigarettes, chewing tobacco, and vaping devices, such as e-cigarettes. If you need help quitting, ask your health care provider. Contact a health care provider if your symptoms do not improve after 2 weeks. This information is not intended to replace advice given to you by your health care provider. Make sure you discuss any questions you have with your health care provider. Document Revised: 12/13/2020 Document Reviewed: 12/13/2020 Elsevier Patient Education  2023 [employer] Smoking Tobacco Information, Adult Smoking tobacco can be harmful to your health. Tobacco contains a toxic colorless chemical called nicotine. Nicotine causes changes in your brain that make you want more and more. This is called addiction. This can make it hard to stop smoking once you start. Tobacco also has other toxic chemicals that can hurt your body and raise your risk of many cancers. Menthol or "lite" tobacco or cigarette brands are not safer than regular brands. How can smoking tobacco affect me? Smoking tobacco puts you at risk for: Cancer. Smoking is most commonly associated with lung cancer, but can also lead to cancer in other parts of the body. Chronic obstructive pulmonary disease (COPD). This is a long-term lung condition that makes it hard to breathe. It also gets worse over time. High blood pressure (hypertension), heart disease, stroke, heart attack, and lung infections, such as pneumonia. Cataracts. This is when the lenses in the eyes become  clouded. Digestive problems. This may include peptic ulcers, heartburn, and gastroesophageal reflux disease (GERD). Oral health problems, such as gum disease, mouth sores, and tooth loss. Loss of taste and smell. Smoking also affects how you look and smell. Smoking may cause: Wrinkles. Yellow or stained teeth, fingers, and fingernails. Bad breath. Bad-smelling clothes and hair. Smoking tobacco can also affect your social life, because: It may be challenging to find places to smoke when away from home. Many workplaces, [employer], hotels, and public places are tobacco-free. Smoking is expensive. This is due to the cost of tobacco and the long-term costs of treating health problems from smoking. Secondhand smoke may affect those around you. Secondhand smoke can cause lung cancer, breathing problems, and heart disease. Children of smokers have a higher risk for: Sudden infant death syndrome (SIDS). Ear infections. Lung infections. What actions can I take to prevent health problems? Quit smoking  Do not start smoking. Quit if you already smoke. Do not replace cigarette smoking with vaping devices, such as e-cigarettes. Make a plan to quit smoking and commit to it. Look for programs to help you, and ask your health care provider for recommendations and ideas. Set a date and write down all the reasons you want to quit. Let your friends and family know you are quitting so they can help and support you. Consider finding friends who also want to quit. It can be easier to quit with someone else, so that you can support each other. Talk with your health care provider about using nicotine replacement medicines to help you quit. These include gum, lozenges, patches, sprays, or pills. If you try to quit but  return to smoking, stay positive. It is common to slip up when you first quit, so take it one day at a time. Be prepared for cravings. When you feel the urge to smoke, chew gum or suck on hard  candy. Lifestyle Stay busy. Take care of your body. Get plenty of exercise, eat a healthy diet, and drink plenty of water. Find ways to manage your stress, such as meditation, yoga, exercise, or time spent with friends and family. Ask your health care provider about having regular tests (screenings) to check for cancer. This may include blood tests, imaging tests, and other tests. Where to find support To get support to quit smoking, consider: Asking your health care provider for more information and resources. Joining a support group for people who want to quit smoking in your local community. There are many effective programs that may help you to quit. Calling the smokefree.gov counselor helpline at 1-800-QUIT-NOW (551)104-5284). Where to find more information You may find more information about quitting smoking from: Centers for Disease Control and Prevention: http://www.osborne.com/ [employer]: smokefree.gov American Lung Association: freedomfromsmoking.org Contact a health care provider if: You have problems breathing. Your lips, nose, or fingers turn blue. You have chest pain. You are coughing up blood. You feel like you will faint. You have other health changes that cause you to worry. Summary Smoking tobacco can negatively affect your health, the health of those around you, your finances, and your social life. Do not start smoking. Quit if you already smoke. If you need help quitting, ask your health care provider. Consider joining a support group for people in your local community who want to quit smoking. There are many effective programs that may help you to quit. This information is not intended to replace advice given to you by your health care provider. Make sure you discuss any questions you have with your health care provider. Document Revised: 08/07/2021 Document Reviewed: 08/07/2021 Elsevier Patient Education  2023 [employer].

## 2022-01-17 NOTE — Progress Notes (Signed)
Subjective:    Patient ID: Rebecca Sweeney, female    DOB: 10/10/1960, 61 y.o.   MRN: [id number]  61y/o Caucasian established female pt c/o cough. States chronic dry cough but past 4 days has worsened and become productive at times. Thick yellow phlegm. Has tried Tessalon without success.   Still smoking 1 pack cigarettes every 2 weeks.  Having congestion/rhinitis/post nasal drip.  Hasn't performed home covid test.  Denied sick contacts/fever/chills/n/v/d/rash/wheezing/throwing up after coughing/chest tightness.  Cough interrupting sleep.  Ran out of singulair needs refill.  Some left cheek pressure.  Denied headache.  Feeling fatigued.  Noticed some hearing loss would like ear check for wax buildup again also.  Some ear pain intermittent left.  Patient denied cough when she was on prednisone taper 2 weeks ago.     Review of Systems  Constitutional:  Positive for fatigue. Negative for activity change, appetite change, chills, diaphoresis and fever.  HENT:  Positive for congestion, ear pain, hearing loss, postnasal drip, rhinorrhea, sinus pressure, sinus pain and voice change. Negative for dental problem, ear discharge, facial swelling, mouth sores, nosebleeds, sneezing, sore throat, tinnitus and trouble swallowing.   Eyes:  Negative for photophobia and visual disturbance.  Respiratory:  Positive for cough. Negative for choking, shortness of breath, wheezing and stridor.   Cardiovascular:  Negative for chest pain.  Gastrointestinal:  Negative for diarrhea, nausea and vomiting.  Endocrine: Negative for cold intolerance and heat intolerance.  Genitourinary:  Negative for difficulty urinating.  Musculoskeletal:  Negative for back pain, gait problem, neck pain and neck stiffness.  Skin:  Negative for rash.  Allergic/Immunologic: Positive for environmental allergies. Negative for food allergies.  Neurological:  Negative for dizziness, tremors, seizures, syncope, facial asymmetry, speech difficulty,  weakness, light-headedness, numbness and headaches.  Hematological:  Negative for adenopathy. Does not bruise/bleed easily.  Psychiatric/Behavioral:  Positive for sleep disturbance. Negative for agitation and confusion.       Objective:   Physical Exam Vitals and nursing note reviewed.  Constitutional:      General: She is awake. She is not in acute distress.    Appearance: Normal appearance. She is well-developed, well-groomed and normal weight. She is not ill-appearing, toxic-appearing or diaphoretic.  HENT:     Head: Normocephalic and atraumatic.     Jaw: There is normal jaw occlusion. No trismus.     Salivary Glands: Right salivary gland is not diffusely enlarged or tender. Left salivary gland is not diffusely enlarged or tender.     Right Ear: Hearing, ear canal and external ear normal. No decreased hearing noted. No laceration, drainage, swelling or tenderness. A middle ear effusion is present. There is no impacted cerumen. No foreign body. No mastoid tenderness. No PE tube. No hemotympanum. Tympanic membrane is not injected, scarred, perforated, erythematous or retracted.     Left Ear: Hearing, ear canal and external ear normal. No decreased hearing noted. No laceration, drainage, swelling or tenderness. A middle ear effusion is present. There is no impacted cerumen. No foreign body. No mastoid tenderness. No PE tube. No hemotympanum. Tympanic membrane is erythematous and bulging. Tympanic membrane is not injected, scarred, perforated or retracted.     Ears:     Comments: Left TM with 25% erythema air fluid level clear intact; right TM opacity nummular cloudy white 25% noted air fluid level; scant gold cerumen noted 6 oclock auditory canal; cobblestoning posterior pharynx; bilateral allergic shiners; lower eyelid edema 1+/4 nonpitting bilaterally; nasal congestion audible; clear discharge nasal turbinates edema/erythema; left  maxillary sinus TTP    Nose: Mucosal edema, congestion and  rhinorrhea present. No nasal deformity, septal deviation or laceration. Rhinorrhea is clear.     Right Turbinates: Enlarged and swollen. Not pale.     Left Turbinates: Enlarged and swollen. Not pale.     Right Sinus: No maxillary sinus tenderness or frontal sinus tenderness.     Left Sinus: Maxillary sinus tenderness present. No frontal sinus tenderness.     Mouth/Throat:     Lips: Pink. No lesions.     Mouth: Mucous membranes are moist. Mucous membranes are not pale, not dry and not cyanotic. No lacerations, oral lesions or angioedema.     Dentition: Abnormal dentition. Has dentures. No gingival swelling, dental abscesses or gum lesions.     Tongue: No lesions. Tongue does not deviate from midline.     Palate: No mass and lesions.     Pharynx: Uvula midline. Pharyngeal swelling and posterior oropharyngeal erythema present. No oropharyngeal exudate or uvula swelling.     Tonsils: No tonsillar exudate or tonsillar abscesses. 0 on the right. 0 on the left.     Comments: Upper denture plate partial Eyes:     General: Lids are normal. Vision grossly intact. Gaze aligned appropriately. Allergic shiner present. No scleral icterus.       Right eye: No foreign body, discharge or hordeolum.        Left eye: No foreign body, discharge or hordeolum.     Extraocular Movements: Extraocular movements intact.     Right eye: Normal extraocular motion and no nystagmus.     Left eye: Normal extraocular motion and no nystagmus.     Conjunctiva/sclera: Conjunctivae normal.     Right eye: Right conjunctiva is not injected. No chemosis, exudate or hemorrhage.    Left eye: Left conjunctiva is not injected. No chemosis, exudate or hemorrhage.    Pupils: Pupils are equal, round, and reactive to light. Pupils are equal.     Right eye: Pupil is round and reactive.     Left eye: Pupil is round and reactive.  Neck:     Thyroid: No thyroid mass, thyromegaly or thyroid tenderness.     Trachea: Trachea and phonation  normal. No tracheal tenderness or tracheal deviation.  Cardiovascular:     Rate and Rhythm: Normal rate and regular rhythm.     Pulses: Normal pulses.          Radial pulses are 2+ on the right side and 2+ on the left side.     Heart sounds: Normal heart sounds, S1 normal and S2 normal. Heart sounds not distant. No murmur heard.   No friction rub. No gallop.  Pulmonary:     Effort: Pulmonary effort is normal. No accessory muscle usage or respiratory distress.     Breath sounds: Normal breath sounds and air entry. No stridor, decreased air movement or transmitted upper airway sounds. No decreased breath sounds, wheezing, rhonchi or rales.     Comments: Frequent nonproductive cough; rare productive yellow opaque thick patient showed tissue with mucous after covering mouth; no adventitious breath sounds auscultated; spoke full sentences without difficulty Chest:     Chest wall: No tenderness.  Abdominal:     General: There is no distension.     Palpations: Abdomen is soft.  Musculoskeletal:        General: No tenderness. Normal range of motion.     Right hand: No swelling, deformity or lacerations. Normal range of motion. Normal strength.  Left hand: No swelling, deformity or lacerations. Normal range of motion. Normal strength.     Cervical back: Normal range of motion and neck supple. No swelling, edema, deformity, erythema, signs of trauma, lacerations, rigidity, tenderness or crepitus. No pain with movement or spinous process tenderness. Normal range of motion.     Right hip: No crepitus. Normal strength.     Left hip: No crepitus. Normal strength.     Right knee: No crepitus.     Left knee: No crepitus.     Right lower leg: No edema.     Left lower leg: No edema.  Lymphadenopathy:     Head:     Right side of head: No submental, submandibular, tonsillar, preauricular, posterior auricular or occipital adenopathy.     Left side of head: No submental, submandibular, tonsillar,  preauricular, posterior auricular or occipital adenopathy.     Cervical: No cervical adenopathy.     Right cervical: No superficial, deep or posterior cervical adenopathy.    Left cervical: No superficial, deep or posterior cervical adenopathy.  Skin:    General: Skin is warm and dry.     Capillary Refill: Capillary refill takes less than 2 seconds.     Coloration: Skin is not ashen, cyanotic, jaundiced, mottled, pale or sallow.     Findings: No abrasion, abscess, acne, bruising, burn, ecchymosis, erythema, signs of injury, laceration, lesion, petechiae, rash or wound.     Nails: There is no clubbing.  Neurological:     General: No focal deficit present.     Mental Status: She is alert and oriented to person, place, and time. Mental status is at baseline. She is not disoriented.     GCS: GCS eye subscore is 4. GCS verbal subscore is 5. GCS motor subscore is 6.     Cranial Nerves: Cranial nerves 2-12 are intact. No cranial nerve deficit, dysarthria or facial asymmetry.     Sensory: Sensation is intact. No sensory deficit.     Motor: Motor function is intact. No weakness, tremor, atrophy, abnormal muscle tone or seizure activity.     Coordination: Coordination is intact. Coordination normal.     Gait: Gait is intact. Gait normal.     Comments: On/off exam table and in/out of chair without difficulty; gait sure and steady in clinic; bilateral hand grasp equal 5/5  Psychiatric:        Attention and Perception: Attention and perception normal.        Mood and Affect: Mood and affect normal.        Speech: Speech normal.        Behavior: Behavior normal. Behavior is cooperative.        Thought Content: Thought content normal.        Cognition and Memory: Cognition and memory normal.        Judgment: Judgment normal.          Assessment & Plan:  A-acute left otitis media, acute rhinosinusitis; acute bronchitis, cigarette nicotine dependence without complication  P-Supportive treatment.  Augmentin  po BID x 10 days #20 RF0 dispensed from [facility] to patient  Tylenol  po QID prn pain/fever.   No evidence of invasive bacterial infection, non toxic and well hydrated.  This is most likely self limiting viral infection.  I do not see where any further testing or imaging is necessary at this time.   I will suggest supportive care, rest, good hygiene and encourage the patient to take adequate fluids.  The  patient is to return to clinic or EMERGENCY ROOM if symptoms worsen or change significantly e.g. ear pain, fever, purulent discharge from ears or bleeding.  Exitcare handout on otitis media   Patient verbalized agreement and understanding of treatment plan.     Restart singulair  po qhs #90 RF0 electronic Rx to her pharmacy of choice.  Consider flonase 1 spray each nostril BID, and restart saline 2 sprays each nostril q2h wa prn congestion.  Prednisone  sig t2 po x 4 days #21 RF0 dispensed from [facility] to patient  Denied personal or family history of ENT cancer.  Shower BID especially prior to bed. No evidence of systemic bacterial infection, non toxic and well hydrated.  I do not see where any further testing or imaging is necessary at this time.   I will suggest supportive care, rest, good hygiene and encourage the patient to take adequate fluids.  The patient is to return to clinic or EMERGENCY ROOM if symptoms worsen or change significantly.  Exitcare handouts on sinusitis and sinus rinse.  Patient verbalized agreement and understanding of treatment plan and had no further questions at this time.   P2:  Hand washing and cover cough   Rx promethazine 6.25mg /70ml sig 5ml po q6h prn cough #120 RF0. Discussed drowsiness common patient stated only going to take at bedtime if working next day.   May continue tessalon pearles  po TID prn cough. Cough lozenges po q2h prn cough given 8 UD from clinic stock.  Prednisone   sig t2 po daily x 4 days.  Holding on albuterol inhaler denied  wheezing/protracted coughing/throwing up after cough.  Discussed possible side effects prednisone increased/decreased appetite, difficulty sleeping, increased blood sugar, increased blood pressure and heart rate.  Restart singulair  po qhs.  If no improvement with plan of care will get chest xray due to smoking history.  Bronchitis simple, community acquired, may have started as viral (probably respiratory syncytial, parainfluenza, influenza, or adenovirus), but now evidence of acute purulent bronchitis with resultant bronchial edema and mucus formation.  Viruses are the most common cause of bronchial inflammation in otherwise healthy adults with acute bronchitis.  The appearance of sputum is not predictive of whether a bacterial infection is present.  Purulent sputum is most often caused by viral infections.  There are a small portion of those caused by non-viral agents being Mycoplama pneumonia.  Microscopic examination or C&S of sputum in the healthy adult with acute bronchitis is generally not helpful (usually negative or normal respiratory flora) other considerations being cough from upper respiratory tract infections, sinusitis or allergic syndromes (mild asthma or viral pneumonia).  Differential Diagnoses:  reactive airway disease (asthma, allergic aspergillosis (eosinophilia), chronic bronchitis, respiratory infection (sinusitis, common cold, pneumonia), congestive heart failure, reflux esophagitis, bronchogenic tumor, aspiration syndromes and/or exposure to pulmonary irritants/smoke.  Without high fever, severe dyspnea, lack of physical findings or other risk factors, I will hold on a chest radiograph and CBC at this time.  I discussed that approximately 50% of patients with acute bronchitis have a cough that lasts up to three weeks, and 25% for over a month.  Tylenol  one to two tablets every four to six hours as needed for fever or myalgias.  No aspirin. Exitcare handout on bronchitis.  ER if  hemopthysis, SOB, worst chest pain of life.   Patient instructed to follow up in one week or sooner if symptoms worsen.  Patient verbalized agreement and understanding of treatment plan.  P2:  hand  washing and cover cough   Discussed smoking cessation.  Exitcare handout.  Patient stated cutting down and trying to quit.

## 2022-01-29 ENCOUNTER — Other Ambulatory Visit: Payer: Self-pay | Admitting: *Deleted

## 2022-01-29 DIAGNOSIS — Z79899 Other long term (current) drug therapy: Secondary | ICD-10-CM

## 2022-01-29 DIAGNOSIS — E782 Mixed hyperlipidemia: Secondary | ICD-10-CM

## 2022-01-30 LAB — HEPATIC FUNCTION PANEL
ALT: 16 IU/L (ref 0–32)
AST: 15 IU/L (ref 0–40)
Albumin: 4.7 g/dL (ref 3.8–4.9)
Alkaline Phosphatase: 56 IU/L (ref 44–121)
Bilirubin Total: 0.3 mg/dL (ref 0.0–1.2)
Bilirubin, Direct: <0.1 mg/dL (ref 0.00–0.40)
Total Protein: 6.6 g/dL (ref 6.0–8.5)

## 2022-02-21 DIAGNOSIS — F411 Generalized anxiety disorder: Secondary | ICD-10-CM | POA: Insufficient documentation

## 2022-02-21 DIAGNOSIS — Z789 Other specified health status: Secondary | ICD-10-CM | POA: Insufficient documentation

## 2022-04-09 ENCOUNTER — Ambulatory Visit: Payer: Self-pay | Admitting: Registered Nurse

## 2022-04-09 DIAGNOSIS — J302 Other seasonal allergic rhinitis: Secondary | ICD-10-CM

## 2022-04-09 DIAGNOSIS — H6983 Other specified disorders of Eustachian tube, bilateral: Secondary | ICD-10-CM

## 2022-04-09 MED ORDER — SALINE SPRAY 0.65 % NA SOLN: 2.0000 | NASAL | 0 refills | Status: DC

## 2022-04-09 MED ORDER — LORATADINE 10 MG PO TABS: 10.0000 mg | ORAL_TABLET | Freq: Every day | ORAL | 3 refills | Status: AC

## 2022-04-09 NOTE — Progress Notes (Signed)
Subjective:    Patient ID: Rebecca Sweeney, female    DOB: Dec 01, 1960, 61 y.o.   MRN: [id number]  61y/o caucasian female established patient here for ear check as hearing water in right ear doesn't have drainage/discharge/fever/chills/headache/muffled hearing.  Denied recent URI illness/post nasal drip/n/v/d.  Patient has not been taking oral antihistamines reported allergies typically flare in the fall.      Review of Systems  Constitutional:  Negative for chills and fever.  HENT:  Positive for congestion. Negative for drooling, ear discharge, ear pain, facial swelling, hearing loss, mouth sores, nosebleeds, rhinorrhea, sinus pressure, sinus pain, sneezing, trouble swallowing and voice change.   Eyes:  Negative for photophobia and visual disturbance.  Respiratory:  Negative for cough, shortness of breath, wheezing and stridor.   Cardiovascular:  Negative for chest pain.  Gastrointestinal:  Negative for diarrhea, nausea and vomiting.  Endocrine: Negative for cold intolerance and heat intolerance.  Genitourinary:  Negative for difficulty urinating.  Musculoskeletal:  Negative for gait problem, neck pain and neck stiffness.  Skin:  Negative for rash.  Allergic/Immunologic: Positive for environmental allergies. Negative for food allergies.  Neurological:  Negative for dizziness, tremors, seizures, syncope, facial asymmetry, speech difficulty, weakness, light-headedness, numbness and headaches.  Hematological:  Negative for adenopathy. Does not bruise/bleed easily.  Psychiatric/Behavioral:  Negative for agitation, confusion and sleep disturbance.        Objective:   Physical Exam Vitals and nursing note reviewed.  Constitutional:      General: She is awake. She is not in acute distress.    Appearance: Normal appearance. She is well-developed, well-groomed and normal weight. She is not ill-appearing, toxic-appearing or diaphoretic.  HENT:     Head: Normocephalic and atraumatic.     Jaw:  There is normal jaw occlusion. No trismus.     Salivary Glands: Right salivary gland is not diffusely enlarged or tender. Left salivary gland is not diffusely enlarged or tender.     Right Ear: Hearing, ear canal and external ear normal. No decreased hearing noted. No laceration, drainage, swelling or tenderness. A middle ear effusion is present. There is no impacted cerumen. No foreign body. No mastoid tenderness. No PE tube. No hemotympanum. Tympanic membrane is not injected, scarred, perforated, erythematous, retracted or bulging.     Left Ear: Hearing, ear canal and external ear normal. No decreased hearing noted. No laceration, drainage, swelling or tenderness. A middle ear effusion is present. There is no impacted cerumen. No foreign body. No mastoid tenderness. No PE tube. No hemotympanum. Tympanic membrane is not injected, scarred, perforated, erythematous, retracted or bulging.     Ears:     Comments: Air fluid level clear bilateral TMS intact without erythema; cobblestoning posterior pharynx; bilateral allergic shiners    Nose: Mucosal edema and congestion present. No nasal deformity, septal deviation, signs of injury, laceration, nasal tenderness or rhinorrhea.     Right Turbinates: Enlarged and swollen. Not pale.     Left Turbinates: Enlarged and swollen. Not pale.     Right Sinus: No maxillary sinus tenderness or frontal sinus tenderness.     Left Sinus: No maxillary sinus tenderness or frontal sinus tenderness.     Mouth/Throat:     Lips: Pink. No lesions.     Mouth: Mucous membranes are moist. Mucous membranes are not pale, not dry and not cyanotic. No lacerations, oral lesions or angioedema.     Dentition: No dental abscesses or gum lesions.     Pharynx: Uvula midline. Pharyngeal swelling and  posterior oropharyngeal erythema present. No oropharyngeal exudate or uvula swelling.     Tonsils: No tonsillar exudate or tonsillar abscesses. 0 on the right. 0 on the left.  Eyes:      General: Lids are normal. Vision grossly intact. Gaze aligned appropriately. Allergic shiner present. No scleral icterus.       Right eye: No foreign body, discharge or hordeolum.        Left eye: No foreign body, discharge or hordeolum.     Extraocular Movements: Extraocular movements intact.     Right eye: Normal extraocular motion and no nystagmus.     Left eye: Normal extraocular motion and no nystagmus.     Conjunctiva/sclera: Conjunctivae normal.     Right eye: Right conjunctiva is not injected. No chemosis, exudate or hemorrhage.    Left eye: Left conjunctiva is not injected. No chemosis, exudate or hemorrhage.    Pupils: Pupils are equal, round, and reactive to light. Pupils are equal.     Right eye: Pupil is round and reactive.     Left eye: Pupil is round and reactive.  Neck:     Thyroid: No thyroid mass or thyromegaly.     Trachea: Trachea and phonation normal. No tracheal tenderness or tracheal deviation.  Cardiovascular:     Rate and Rhythm: Normal rate and regular rhythm.     Pulses: Normal pulses.          Radial pulses are 2+ on the right side and 2+ on the left side.  Pulmonary:     Effort: Pulmonary effort is normal. No accessory muscle usage or respiratory distress.     Breath sounds: Normal breath sounds and air entry. No stridor or transmitted upper airway sounds. No decreased breath sounds, wheezing, rhonchi or rales.     Comments: Spoke full sentences  without difficulty; no cough observed in clinic Chest:     Chest wall: No tenderness.  Abdominal:     General: There is no distension.     Palpations: Abdomen is soft.  Musculoskeletal:        General: No tenderness. Normal range of motion.     Right shoulder: Normal.     Left shoulder: Normal.     Right hand: Normal.     Left hand: Normal.     Cervical back: Normal range of motion and neck supple. No edema, erythema, signs of trauma, rigidity, tenderness or crepitus. No pain with movement. Normal range of  motion.     Right hip: Normal.     Left hip: Normal.     Right knee: Normal.     Left knee: Normal.     Right lower leg: No edema.     Left lower leg: No edema.  Lymphadenopathy:     Head:     Right side of head: No submental, submandibular, tonsillar, preauricular, posterior auricular or occipital adenopathy.     Left side of head: No submental, submandibular, tonsillar, preauricular, posterior auricular or occipital adenopathy.     Cervical: No cervical adenopathy.     Right cervical: No superficial, deep or posterior cervical adenopathy.    Left cervical: No superficial, deep or posterior cervical adenopathy.  Skin:    General: Skin is warm and dry.     Capillary Refill: Capillary refill takes less than 2 seconds.     Coloration: Skin is not ashen, cyanotic, jaundiced, mottled, pale or sallow.     Findings: No abrasion, abscess, acne, bruising, burn, ecchymosis, erythema, signs  of injury, laceration, lesion, petechiae, rash or wound.     Nails: There is no clubbing.  Neurological:     General: No focal deficit present.     Mental Status: She is alert and oriented to person, place, and time. Mental status is at baseline. She is not disoriented.     GCS: GCS eye subscore is 4. GCS verbal subscore is 5. GCS motor subscore is 6.     Cranial Nerves: Cranial nerves 2-12 are intact. No cranial nerve deficit, dysarthria or facial asymmetry.     Sensory: No sensory deficit.     Motor: Motor function is intact. No weakness, tremor, atrophy, abnormal muscle tone or seizure activity.     Coordination: Coordination is intact. Coordination normal.     Gait: Gait is intact. Gait normal.     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        Mood and Affect: Mood and affect normal.        Speech: Speech normal.        Behavior: Behavior  normal. Behavior is cooperative.        Thought Content: Thought content normal.        Cognition and Memory: Cognition and memory normal.        Judgment: Judgment normal.           Assessment & Plan:   A-eustachian tube dysfunction bilateral; season allergic rhinitis  P- No evidence of invasive bacterial infection, non toxic and well hydrated.  I do not see where any further testing or imaging is necessary at this time.   I will suggest supportive care, rest, good hygiene and encourage the patient to take adequate fluids.  The patient is to return to clinic or EMERGENCY ROOM if symptoms worsen or change significantly e.g. ear pain, fever, purulent discharge from ears or bleeding.  Exitcare handout on eustachian tube dysfunction.  Discussed with patient post nasal drip irritates throat/causes swelling blocks eustachian tubes from draining and fluid fills up middle ear.  Bacteria/viruses can grow in fluid and with moving head tube compressed and increases pressure in tube/ear worsening pain.  Studies show will take 30 days for fluid to resolve after post nasal drip controlled with nasal steroid/antihistamine. Antibiotics and steroids do not speed up fluid removal.  Patient verbalized agreement and understanding of treatment plan and had no further questions at this time.   Patient may use normal saline nasal spray 2 sprays each nostril q2h wa as needed. flonase 1 spray each nostril BID discussed restart use flonase/nasal saline if exposed to a lot of dust at work.  Shower after work.  Patient denied personal or family history of ENT cancer.  OTC antihistamine of choice claritin/zyrtec 10mg  po daily.  Electronic rx sent to her pharmacy of choice loratadine 10mg  po daily #90 RF3.  Patient stated most likely will go to [facility] and buy OTC discussed cheapest cost.  Given 2 ud loratadine from clinic stock to take today and tomorrow.  Discussed restart oral antihistamine.  Avoid triggers if possible.   Shower prior to bedtime if exposed to triggers.  If allergic dust/dust mites recommend mattress/pillow covers/encasements; washing linens, vacuuming, sweeping, dusting weekly.  Call or return to clinic as needed if these symptoms worsen or fail to improve as anticipated.   Exitcare handout  on allergic rhinitis and sinus rinse.  Patient verbalized understanding of instructions, agreed with plan of care and had no further questions at this time.  P2:  Avoidance and hand washing.

## 2022-04-09 NOTE — Patient Instructions (Signed)
Eustachian Tube Dysfunction  Eustachian tube dysfunction refers to a condition in which a blockage develops in the narrow passage that connects the middle ear to the back of the nose (eustachian tube). The eustachian tube regulates air pressure in the middle ear by letting air move between the ear and nose. It also helps to drain fluid from the middle ear space. Eustachian tube dysfunction can affect one or both ears. When the eustachian tube does not function properly, air pressure, fluid, or both can build up in the middle ear. What are the causes? This condition occurs when the eustachian tube becomes blocked or cannot open normally. Common causes of this condition include: Ear infections. Colds and other infections that affect the nose, mouth, and throat (upper respiratory tract). Allergies. Irritation from cigarette smoke. Irritation from stomach acid coming up into the esophagus (gastroesophageal reflux). The esophagus is the part of the body that moves food from the mouth to the stomach. Sudden changes in air pressure, such as from descending in an airplane or scuba diving. Abnormal growths in the nose or throat, such as: Growths that line the nose (nasal polyps). Abnormal growth of cells (tumors). Enlarged tissue at the back of the throat (adenoids). What increases the risk? You are more likely to develop this condition if: You smoke. You are overweight. You are a child who has: Certain birth defects of the mouth, such as cleft palate. Large tonsils or adenoids. What are the signs or symptoms? Common symptoms of this condition include: A feeling of fullness in the ear. Ear pain. Clicking or popping noises in the ear. Ringing in the ear (tinnitus). Hearing loss. Loss of balance. Dizziness. Symptoms may get worse when the air pressure around you changes, such as when you travel to an area of high elevation, fly on an airplane, or go scuba diving. How is this diagnosed? This  condition may be diagnosed based on: Your symptoms. A physical exam of your ears, nose, and throat. Tests, such as those that measure: The movement of your eardrum. Your hearing (audiometry). How is this treated? Treatment depends on the cause and severity of your condition. In mild cases, you may relieve your symptoms by moving air into your ears. This is called "popping the ears." In more severe cases, or if you have symptoms of fluid in your ears, treatment may include: Medicines to relieve congestion (decongestants). Medicines that treat allergies (antihistamines). Nasal sprays or ear drops that contain medicines that reduce swelling (steroids). A procedure to drain the fluid in your eardrum. In this procedure, a small tube may be placed in the eardrum to: Drain the fluid. Restore the air in the middle ear space. A procedure to insert a balloon device through the nose to inflate the opening of the eustachian tube (balloon dilation). Follow these instructions at home: Lifestyle Do not do any of the following until your health care provider approves: Travel to high altitudes. Fly in airplanes. Work in a pressurized cabin or room. Scuba dive. Do not use any products that contain nicotine or tobacco. These products include cigarettes, chewing tobacco, and vaping devices, such as e-cigarettes. If you need help quitting, ask your health care provider. Keep your ears dry. Wear fitted earplugs during showering and bathing. Dry your ears completely after. General instructions Take over-the-counter and prescription medicines only as told by your health care provider. Use techniques to help pop your ears as recommended by your health care provider. These may include: Chewing gum. Yawning. Frequent, forceful swallowing.   Closing your mouth, holding your nose closed, and gently blowing as if you are trying to blow air out of your nose. Keep all follow-up visits. This is important. Contact a  health care provider if: Your symptoms do not go away after treatment. Your symptoms come back after treatment. You are unable to pop your ears. You have: A fever. Pain in your ear. Pain in your head or neck. Fluid draining from your ear. Your hearing suddenly changes. You become very dizzy. You lose your balance. Get help right away if: You have a sudden, severe increase in any of your symptoms. Summary Eustachian tube dysfunction refers to a condition in which a blockage develops in the eustachian tube. It can be caused by ear infections, allergies, inhaled irritants, or abnormal growths in the nose or throat. Symptoms may include ear pain or fullness, hearing loss, or ringing in the ears. Mild cases are treated with techniques to unblock the ears, such as yawning or chewing gum. More severe cases are treated with medicines or procedures. This information is not intended to replace advice given to you by your health care provider. Make sure you discuss any questions you have with your health care provider. Document Revised: 10/23/2020 Document Reviewed: 10/23/2020 Elsevier Patient Education  [employer]. How to Perform a Sinus Rinse A sinus rinse is a home treatment that is used to rinse your sinuses with a germ-free (sterile) mixture of salt and water (saline solution). Sinuses are air-filled spaces in your skull that are behind the bones of your face and forehead. They open into your nasal cavity. A sinus rinse can help to clear mucus, dirt, dust, or pollen from your nasal cavity. You may do a sinus rinse when you have a cold, a virus, nasal allergy symptoms, a sinus infection, or stuffiness in your nose or sinuses. What are the risks? A sinus rinse is generally safe and effective. However, there are a few risks, which include: A burning sensation in your sinuses. This may happen if you do not make the saline solution as directed. Be sure to follow all directions when making  the saline solution. Nasal irritation. Infection. This may be from unclean supplies or from contaminated water. Infection from contaminated water is rare, but possible. Do not do a sinus rinse if you have had ear or nasal surgery, ear infection, or plugged ears, unless recommended by your health care provider. Supplies needed: Saline solution or powder. Distilled or sterile water to mix with saline powder. You may use boiled and cooled tap water. Boil tap water for 5 minutes; cool until it is lukewarm. Use within 24 hours. Do not use regular tap water to mix with the saline solution. Neti pot or nasal rinse bottle. These supplies release the saline solution into your nose and through your sinuses. Neti pots and nasal rinse bottles can be purchased at [employer], a health food store, or online. How to perform a sinus rinse  Wash your hands with soap and water for at least 20 seconds. If soap and water are not available, use hand sanitizer. Wash your device according to the directions that came with the product and then dry it. Use the solution that comes with your product or one that is sold separately in stores. Follow the mixing directions on the package to mix with sterile or distilled water. Fill the device with the amount of saline solution noted in the device instructions. Stand by a sink and tilt your head sideways  over the sink. Place the spout of the device in your upper nostril (the one closer to the ceiling). Gently pour or squeeze the saline solution into your nasal cavity. The liquid should drain out from the lower nostril if you are not too congested. While rinsing, breathe through your open mouth. Gently blow your nose to clear any mucus and rinse solution. Blowing too hard may cause ear pain. Turn your head in the other direction and repeat in your other nostril. Clean and rinse your device with clean water and then air-dry it. Talk with your health care provider or  pharmacist if you have questions about how to do a sinus rinse. Summary A sinus rinse is a home treatment that is used to rinse your sinuses with a sterile mixture of salt and water (saline solution). You may do a sinus rinse when you have a cold, a virus, nasal allergy symptoms, a sinus infection, or stuffiness in your nose or sinuses. A sinus rinse is generally safe and effective. Follow all instructions carefully. This information is not intended to replace advice given to you by your health care provider. Make sure you discuss any questions you have with your health care provider. Document Revised: 01/29/2021 Document Reviewed: 01/29/2021 Elsevier Patient Education  2023 [employer] Allergic Rhinitis, Adult  Allergic rhinitis is an allergic reaction that affects the mucous membrane inside the nose. The mucous membrane is the tissue that produces mucus. There are two types of allergic rhinitis: Seasonal. This type is also called hay fever and happens only during certain seasons. Perennial. This type can happen at any time of the year. Allergic rhinitis cannot be spread from person to person. This condition can be mild, moderate, or severe. It can develop at any age and may be outgrown. What are the causes? This condition is caused by allergens. These are things that can cause an allergic reaction. Allergens may differ for seasonal allergic rhinitis and perennial allergic rhinitis. Seasonal allergic rhinitis is triggered by pollen. Pollen can come from grasses, trees, and weeds. Perennial allergic rhinitis may be triggered by: Dust mites. Proteins in a pet's urine, saliva, or dander. Dander is dead skin cells from a pet. Smoke, mold, or car fumes. What increases the risk? You are more likely to develop this condition if you have a family history of allergies or other conditions related to allergies, including: Allergic conjunctivitis. This is inflammation of parts of the eyes and  eyelids. Asthma. This condition affects the lungs and makes it hard to breathe. Atopic dermatitis or eczema. This is long term (chronic) inflammation of the skin. Food allergies. What are the signs or symptoms? Symptoms of this condition include: Sneezing or coughing. A stuffy nose (nasal congestion), itchy nose, or nasal discharge. Itchy eyes and tearing of the eyes. A feeling of mucus dripping down the back of your throat (postnasal drip). Trouble sleeping. Tiredness or fatigue. Headache. Sore throat. How is this diagnosed? This condition may be diagnosed with your symptoms, medical history, and physical exam. Your health care provider may check for related conditions, such as: Asthma. Pink eye. This is eye inflammation caused by infection (conjunctivitis). Ear infection. Upper respiratory infection. This is an infection in the nose, throat, or upper airways. You may also have tests to find out which allergens trigger your symptoms. These may include skin tests or blood tests. How is this treated? There is no cure for this condition, but treatment can help control symptoms. Treatment may include: Taking medicines that block  allergy symptoms, such as corticosteroids and antihistamines. Medicine may be given as a shot, nasal spray, or pill. Avoiding any allergens. Being exposed again and again to tiny amounts of allergens to help you build a defense against allergens (immunotherapy). This is done if other treatments have not helped. It may include: Allergy shots. These are injected medicines that have small amounts of allergen in them. Sublingual immunotherapy. This involves taking small doses of a medicine with allergen in it under your tongue. If these treatments do not work, your health care provider may prescribe newer, stronger medicines. Follow these instructions at home: Avoiding allergens Find out what you are allergic to and avoid those allergens. These are some things you can  do to help avoid allergens: If you have perennial allergies: Replace carpet with wood, tile, or vinyl flooring. Carpet can trap dander and dust. Do not smoke. Do not allow smoking in your home. Change your heating and air conditioning filters at least once a month. If you have seasonal allergies, take these steps during allergy season: Keep windows closed as much as possible. Plan outdoor activities when pollen counts are lowest. Check pollen counts before you plan outdoor activities. When coming indoors, change clothing and shower before sitting on furniture or bedding. If you have a pet in the house that produces allergens: Keep the pet out of the bedroom. Vacuum, sweep, and dust regularly. General instructions Take over-the-counter and prescription medicines only as told by your health care provider. Drink enough fluid to keep your urine pale yellow. Keep all follow-up visits as told by your health care provider. This is important. Where to find more information American Academy of Allergy, Asthma & Immunology: www.aaaai.org Contact a health care provider if: You have a fever. You develop a cough that does not go away. You make whistling sounds when you breathe (wheeze). Your symptoms slow you down or stop you from doing your normal activities each day. Get help right away if: You have shortness of breath. This symptom may represent a serious problem that is an emergency. Do not wait to see if the symptom will go away. Get medical help right away. Call your local emergency services (911 in the [place] Do not drive yourself to the hospital. Summary Allergic rhinitis may be managed by taking medicines as directed and avoiding allergens. If you have seasonal allergies, keep windows closed as much as possible during allergy season. Contact your health care provider if you develop a fever or a cough that does not go away. This information is not intended to replace advice given to you by  your health care provider. Make sure you discuss any questions you have with your health care provider. Document Revised: 10/01/2019 Document Reviewed: 08/10/2019 Elsevier Patient Education  2023 [employer].

## 2022-04-11 ENCOUNTER — Other Ambulatory Visit: Payer: Self-pay

## 2022-04-19 ENCOUNTER — Ambulatory Visit: Payer: Self-pay

## 2022-04-19 ENCOUNTER — Telehealth: Payer: Self-pay | Admitting: Registered Nurse

## 2022-04-19 DIAGNOSIS — G4700 Insomnia, unspecified: Secondary | ICD-10-CM | POA: Insufficient documentation

## 2022-04-19 DIAGNOSIS — H8110 Benign paroxysmal vertigo, unspecified ear: Secondary | ICD-10-CM

## 2022-04-19 MED ORDER — MECLIZINE HCL 25 MG PO TABS: 25.0000 mg | ORAL_TABLET | Freq: Four times a day (QID) | ORAL | 0 refills | Status: DC | PRN

## 2022-04-19 NOTE — Progress Notes (Signed)
Pt reports fullness in R ear.  Pt has difficulty with balance when asked to close eyes and touch finger tip to her nose.  Pt has difficulty when asked to stand on one foot.    No redness in ear, slight wax drainage, no bulging of eardrum.  Eardrum dull in color.    Contacted Albina Billet, NP with findings.  NP to send med to Pharmacy.

## 2022-04-19 NOTE — Telephone Encounter (Signed)
Contacted by RN Oda Cogan patient into clinic earlier today with fullness in right ear complaint.  Balance affected with eyes closed.  Otic exam by RN Stone denied redness in canal/TM, some cerumen noted, eardrum intact not bulging or retracted and dull in color.  Patient has previously used meclizine with good results.  Will send in electronic Rx for patient meclizine 25mg  po QID prn vertigo #30 RF0.  RN stated she would notify patient.  Contacted patient via telephone to verify pharmacy preference.  Electronic Rx sent to pharmacy of choice at 1514.  Patient verbalized understanding information/instructions, agreed with plan of care and had no further questions at this time.

## 2022-04-21 ENCOUNTER — Encounter: Payer: Self-pay | Admitting: Registered Nurse

## 2022-04-21 MED ORDER — ATORVASTATIN CALCIUM 20 MG PO TABS: 20.0000 mg | ORAL_TABLET | Freq: Every day | ORAL | 0 refills | Status: DC

## 2022-04-21 NOTE — Telephone Encounter (Signed)
Patient requested refill of atorvastatin 22 Aug 23 from [facility] formulary.  Reviewed Epic and had labs with [facility] 02/21/22 [facility]  Had started atorvastatin 30m on 01/03/22 due to total cholesterol 283 triglycerides 244 HDL 44 LDL 192 total chol/HDL ratio 6.4 above average CHD risk.  Patient reported tolerating medication well and [facility]  LFTs normal, denied abdomen pain, muscle cramps.  Atorvastatin 227mpo daily #90 RF0 dispensed to patient from [facility].  [facility] with [facility].  Patient verbalized understanding information/instructions, agreed with plan of care and had no further questions at this time.   Ref Range & Units 1 mo ago Comments  Glucose 70 - 99 mg/dL 87    BUN 8 - 27 mg/dL 13    Creatinine 0.57 - 1.00 mg/dL 0.73    eGFR >59 mL/min/1.73 94    BUN/Creatinine Ratio 12 - 28 18    Sodium 134 - 144 mmol/L 142    Potassium 3.5 - 5.2 mmol/L 4.3    Chloride 96 - 106 mmol/L 104    CO2 20 - 29 mmol/L 22    CALCIUM 8.7 - 10.3 mg/dL 9.8    Total Protein 6.0 - 8.5 g/dL 6.7    Albumin, Serum 3.8 - 4.9 g/dL 4.6                 **Effective March 04, 2022 Albumin reference interval**                   will be changing to:                              Age                  Female          Female                             0 -   7 days       3.6 - 4.9      3.6 - 4.9                             8 -  30 days       3.5 - 4.6      3.5 - 4.6                             1 -   6 months     3.7 - 4.8      3.7 - 4.8                      7 months -   2 years      4.0 - 5.0      4.0 - 5.0                             3 -   5 years      4.1 - 5.0      4.1 - 5.0  6 -  12 years      4.2 - 5.0      4.2 - 5.0                            13 -  30 years      4.3 - 5.2      4.0 - 5.0                            31 -  50 years      4.1 - 5.1      3.9 - 4.9                            51 -  60 years       3.8 - 4.9      3.8 - 4.9                            61 -  70 years      3.9 - 4.9      3.9 - 4.9                            71 -  80 years      3.8 - 4.8      3.8 - 4.8                            81 -  89 years      3.7 - 4.7      3.7 - 4.7                            90 - 199 years      3.6 - 4.6      3.6 - 4.6  Globulin, Total 1.5 - 4.5 g/dL 2.1    Albumin/Globulin Ratio 1.2 - 2.2 2.2    Total Bilirubin 0.0 - 1.2 mg/dL 0.3    Alkaline Phosphatase 44 - 121 IU/L 54    AST 0 - 40 IU/L 17    ALT (SGPT) 0 - 32 IU/L 15    Resulting Agency  [facility]   Narrative Performed by [facility] Performed at:  [facility]  [address] [place], [place]  [id number]  Lab Director: Rush Farmer MD, Phone:  [phone] Specimen Collected: 02/21/22 09:21   Performed by: Maryan Puls Last Resulted: 02/22/22 07:36  Received From: [place]  Result Received: 03/22/22 09:12    Ref Range & Units 1 mo ago Comments  Cholesterol, Total 100 - 199 mg/dL 190    Triglycerides 0 - 149 mg/dL 124    HDL >39 mg/dL 45    VLDL Cholesterol Cal 5 - 40 mg/dL 22    LDL 0 - 99 mg/dL 123 High     LDL/HDL Ratio 0.0 - 3.2 ratio 2.7                                      LDL/HDL Ratio  Men  Women                                1/2 Avg.Risk  1.0    1.5                                    Avg.Risk  3.6    3.2                                 2X Avg.Risk  6.2    5.0                                 3X Avg.Risk  8.0    6.1  Resulting Agency  [facility]   Narrative Performed by [facility] Performed at:  [facility]  [address] [place], [place]  [id number]  Lab Director: Rush Farmer MD, Phone:  [phone] Specimen Collected: 02/21/22 09:21   Performed by: Maryan Puls Last Resulted: 02/22/22 07:36  Received From: [place] [facility]  Result Received: 03/22/22 09:12

## 2022-04-23 ENCOUNTER — Encounter: Payer: Self-pay | Admitting: Registered Nurse

## 2022-04-23 ENCOUNTER — Ambulatory Visit: Payer: Self-pay | Admitting: Registered Nurse

## 2022-04-23 VITALS — BP 125/89 | HR 67 | Temp 97.6°F | Resp 16

## 2022-04-23 DIAGNOSIS — J209 Acute bronchitis, unspecified: Secondary | ICD-10-CM

## 2022-04-23 DIAGNOSIS — J019 Acute sinusitis, unspecified: Secondary | ICD-10-CM

## 2022-04-23 DIAGNOSIS — H6983 Other specified disorders of Eustachian tube, bilateral: Secondary | ICD-10-CM

## 2022-04-23 NOTE — Patient Instructions (Signed)
Acute Bronchitis, Adult  Acute bronchitis is sudden inflammation of the main airways (bronchi) that come off the windpipe (trachea) in the lungs. The swelling causes the airways to get smaller and make more mucus than normal. This can make it hard to breathe and can cause coughing or noisy breathing (wheezing). Acute bronchitis may last several weeks. The cough may last longer. Allergies, asthma, and exposure to smoke may make the condition worse. What are the causes? This condition can be caused by germs and by substances that irritate the lungs, including: Cold and flu viruses. The most common cause of this condition is the virus that causes the common cold. Bacteria. This is less common. Breathing in substances that irritate the lungs, including: Smoke from cigarettes and other forms of tobacco. Dust and pollen. Fumes from household cleaning products, gases, or burned fuel. Indoor or outdoor air pollution. What increases the risk? The following factors may make you more likely to develop this condition: A weak body's defense system, also called the immune system. A condition that affects your lungs and breathing, such as asthma. What are the signs or symptoms? Common symptoms of this condition include: Coughing. This may bring up clear, yellow, or green mucus from your lungs (sputum). Wheezing. Runny or stuffy nose. Having too much mucus in your lungs (chest congestion). Shortness of breath. Aches and pains, including sore throat or chest. How is this diagnosed? This condition is usually diagnosed based on: Your symptoms and medical history. A physical exam. You may also have other tests, including tests to rule out other conditions, such as pneumonia. These tests include: A test of lung function. Test of a mucus sample to look for the presence of bacteria. Tests to check the oxygen level in your blood. Blood tests. Chest X-ray. How is this treated? Most cases of acute  bronchitis clear up over time without treatment. Your health care provider may recommend: Drinking more fluids to help thin your mucus so it is easier to cough up. Taking inhaled medicine (inhaler) to improve air flow in and out of your lungs. Using a vaporizer or a humidifier. These are machines that add water to the air to help you breathe better. Taking a medicine that thins mucus and clears congestion (expectorant). Taking a medicine that prevents or stops coughing (cough suppressant). It is not common to take an antibiotic medicine for this condition. Follow these instructions at home:  Take over-the-counter and prescription medicines only as told by your health care provider. Use an inhaler, vaporizer, or humidifier as told by your health care provider. Take two teaspoons (10 mL) of honey at bedtime to lessen coughing at night. Drink enough fluid to keep your urine pale yellow. Do not use any products that contain nicotine or tobacco. These products include cigarettes, chewing tobacco, and vaping devices, such as e-cigarettes. If you need help quitting, ask your health care provider. Get plenty of rest. Return to your normal activities as told by your health care provider. Ask your health care provider what activities are safe for you. Keep all follow-up visits. This is important. How is this prevented? To lower your risk of getting this condition again: Wash your hands often with soap and water for at least 20 seconds. If soap and water are not available, use hand sanitizer. Avoid contact with people who have cold symptoms. Try not to touch your mouth, nose, or eyes with your hands. Avoid breathing in smoke or chemical fumes. Breathing smoke or chemical fumes will make   your condition worse. Get the flu shot every year. Contact a health care provider if: Your symptoms do not improve after 2 weeks. You have trouble coughing up the mucus. Your cough keeps you awake at night. You have  a fever. Get help right away if you: Cough up blood. Feel pain in your chest. Have severe shortness of breath. Faint or keep feeling like you are going to faint. Have a severe headache. Have a fever or chills that get worse. These symptoms may represent a serious problem that is an emergency. Do not wait to see if the symptoms will go away. Get medical help right away. Call your local emergency services (911 in the [place] Do not drive yourself to the hospital. Summary Acute bronchitis is inflammation of the main airways (bronchi) that come off the windpipe (trachea) in the lungs. The swelling causes the airways to get smaller and make more mucus than normal. Drinking more fluids can help thin your mucus so it is easier to cough up. Take over-the-counter and prescription medicines only as told by your health care provider. Do not use any products that contain nicotine or tobacco. These products include cigarettes, chewing tobacco, and vaping devices, such as e-cigarettes. If you need help quitting, ask your health care provider. Contact a health care provider if your symptoms do not improve after 2 weeks. This information is not intended to replace advice given to you by your health care provider. Make sure you discuss any questions you have with your health care provider. Document Revised: 11/22/2021 Document Reviewed: 12/13/2020 Else[employer] Patient Education  2023 [employer] Sinus Infection, Adult A sinus infection, also called sinusitis, is inflammation of your sinuses. Sinuses are hollow spaces in the bones around your face. Your sinuses are located: Around your eyes. In the middle of your forehead. Behind your nose. In your cheekbones. Mucus normally drains out of your sinuses. When your nasal tissues become inflamed or swollen, mucus can become trapped or blocked. This allows bacteria, viruses, and fungi to grow, which leads to infection. Most infections of the sinuses are caused by a  virus. A sinus infection can develop quickly. It can last for up to 4 weeks (acute) or for more than 12 weeks (chronic). A sinus infection often develops after a cold. What are the causes? This condition is caused by anything that creates swelling in the sinuses or stops mucus from draining. This includes: Allergies. Asthma. Infection from bacteria or viruses. Deformities or blockages in your nose or sinuses. Abnormal growths in the nose (nasal polyps). Pollutants, such as chemicals or irritants in the air. Infection from fungi. This is rare. What increases the risk? You are more likely to develop this condition if you: Have a weak body defense system (immune system). Do a lot of swimming or diving. Overuse nasal sprays. Smoke. What are the signs or symptoms? The main symptoms of this condition are pain and a feeling of pressure around the affected sinuses. Other symptoms include: Stuffy nose or congestion that makes it difficult to breathe through your nose. Thick yellow or greenish drainage from your nose. Tenderness, swelling, and warmth over the affected sinuses. A cough that may get worse at night. Decreased sense of smell and taste. Extra mucus that collects in the throat or the back of the nose (postnasal drip) causing a sore throat or bad breath. Tiredness (fatigue). Fever. How is this diagnosed? This condition is diagnosed based on: Your symptoms. Your medical history. A physical exam. Tests to find   out if your condition is acute or chronic. This may include: Checking your nose for nasal polyps. Viewing your sinuses using a device that has a light (endoscope). Testing for allergies or bacteria. Imaging tests, such as an MRI or CT scan. In rare cases, a bone biopsy may be done to rule out more serious types of fungal sinus disease. How is this treated? Treatment for a sinus infection depends on the cause and whether your condition is chronic or acute. If caused by a  virus, your symptoms should go away on their own within 10 days. You may be given medicines to relieve symptoms. They include: Medicines that shrink swollen nasal passages (decongestants). A spray that eases inflammation of the nostrils (topical intranasal corticosteroids). Rinses that help get rid of thick mucus in your nose (nasal saline washes). Medicines that treat allergies (antihistamines). Over-the-counter pain relievers. If caused by bacteria, your health care provider may recommend waiting to see if your symptoms improve. Most bacterial infections will get better without antibiotic medicine. You may be given antibiotics if you have: A severe infection. A weak immune system. If caused by narrow nasal passages or nasal polyps, surgery may be needed. Follow these instructions at home: Medicines Take, use, or apply over-the-counter and prescription medicines only as told by your health care provider. These may include nasal sprays. If you were prescribed an antibiotic medicine, take it as told by your health care provider. Do not stop taking the antibiotic even if you start to feel better. Hydrate and humidify  Drink enough fluid to keep your urine pale yellow. Staying hydrated will help to thin your mucus. Use a cool mist humidifier to keep the humidity level in your home above 50%. Inhale steam for 10-15 minutes, 3-4 times a day, or as told by your health care provider. You can do this in the bathroom while a hot shower is running. Limit your exposure to cool or dry air. Rest Rest as much as possible. Sleep with your head raised (elevated). Make sure you get enough sleep each night. General instructions  Apply a warm, moist washcloth to your face 3-4 times a day or as told by your health care provider. This will help with discomfort. Use nasal saline washes as often as told by your health care provider. Wash your hands often with soap and water to reduce your exposure to germs. If  soap and water are not available, use hand sanitizer. Do not smoke. Avoid being around people who are smoking (secondhand smoke). Keep all follow-up visits. This is important. Contact a health care provider if: You have a fever. Your symptoms get worse. Your symptoms do not improve within 10 days. Get help right away if: You have a severe headache. You have persistent vomiting. You have severe pain or swelling around your face or eyes. You have vision problems. You develop confusion. Your neck is stiff. You have trouble breathing. These symptoms may be an emergency. Get help right away. Call 911. Do not wait to see if the symptoms will go away. Do not drive yourself to the hospital. Summary A sinus infection is soreness and inflammation of your sinuses. Sinuses are hollow spaces in the bones around your face. This condition is caused by nasal tissues that become inflamed or swollen. The swelling traps or blocks the flow of mucus. This allows bacteria, viruses, and fungi to grow, which leads to infection. If you were prescribed an antibiotic medicine, take it as told by your health care provider.   Do not stop taking the antibiotic even if you start to feel better. Keep all follow-up visits. This is important. This information is not intended to replace advice given to you by your health care provider. Make sure you discuss any questions you have with your health care provider. Document Revised: 07/17/2021 Document Reviewed: 07/17/2021 Elsevier Patient Education  2023 [employer] How to Perform a Sinus Rinse A sinus rinse is a home treatment that is used to rinse your sinuses with a germ-free (sterile) mixture of salt and water (saline solution). Sinuses are air-filled spaces in your skull that are behind the bones of your face and forehead. They open into your nasal cavity. A sinus rinse can help to clear mucus, dirt, dust, or pollen from your nasal cavity. You may do a sinus rinse when  you have a cold, a virus, nasal allergy symptoms, a sinus infection, or stuffiness in your nose or sinuses. What are the risks? A sinus rinse is generally safe and effective. However, there are a few risks, which include: A burning sensation in your sinuses. This may happen if you do not make the saline solution as directed. Be sure to follow all directions when making the saline solution. Nasal irritation. Infection. This may be from unclean supplies or from contaminated water. Infection from contaminated water is rare, but possible. Do not do a sinus rinse if you have had ear or nasal surgery, ear infection, or plugged ears, unless recommended by your health care provider. Supplies needed: Saline solution or powder. Distilled or sterile water to mix with saline powder. You may use boiled and cooled tap water. Boil tap water for 5 minutes; cool until it is lukewarm. Use within 24 hours. Do not use regular tap water to mix with the saline solution. Neti pot or nasal rinse bottle. These supplies release the saline solution into your nose and through your sinuses. Neti pots and nasal rinse bottles can be purchased at [employer], a health food store, or online. How to perform a sinus rinse  Wash your hands with soap and water for at least 20 seconds. If soap and water are not available, use hand sanitizer. Wash your device according to the directions that came with the product and then dry it. Use the solution that comes with your product or one that is sold separately in stores. Follow the mixing directions on the package to mix with sterile or distilled water. Fill the device with the amount of saline solution noted in the device instructions. Stand by a sink and tilt your head sideways over the sink. Place the spout of the device in your upper nostril (the one closer to the ceiling). Gently pour or squeeze the saline solution into your nasal cavity. The liquid should drain out from the  lower nostril if you are not too congested. While rinsing, breathe through your open mouth. Gently blow your nose to clear any mucus and rinse solution. Blowing too hard may cause ear pain. Turn your head in the other direction and repeat in your other nostril. Clean and rinse your device with clean water and then air-dry it. Talk with your health care provider or pharmacist if you have questions about how to do a sinus rinse. Summary A sinus rinse is a home treatment that is used to rinse your sinuses with a sterile mixture of salt and water (saline solution). You may do a sinus rinse when you have a cold, a virus, nasal allergy symptoms, a sinus  infection, or stuffiness in your nose or sinuses. A sinus rinse is generally safe and effective. Follow all instructions carefully. This information is not intended to replace advice given to you by your health care provider. Make sure you discuss any questions you have with your health care provider. Document Revised: 01/29/2021 Document Reviewed: 01/29/2021 Elsevier Patient Education  2023 [employer] Eustachian Tube Dysfunction  Eustachian tube dysfunction refers to a condition in which a blockage develops in the narrow passage that connects the middle ear to the back of the nose (eustachian tube). The eustachian tube regulates air pressure in the middle ear by letting air move between the ear and nose. It also helps to drain fluid from the middle ear space. Eustachian tube dysfunction can affect one or both ears. When the eustachian tube does not function properly, air pressure, fluid, or both can build up in the middle ear. What are the causes? This condition occurs when the eustachian tube becomes blocked or cannot open normally. Common causes of this condition include: Ear infections. Colds and other infections that affect the nose, mouth, and throat (upper respiratory tract). Allergies. Irritation from cigarette smoke. Irritation from  stomach acid coming up into the esophagus (gastroesophageal reflux). The esophagus is the part of the body that moves food from the mouth to the stomach. Sudden changes in air pressure, such as from descending in an airplane or scuba diving. Abnormal growths in the nose or throat, such as: Growths that line the nose (nasal polyps). Abnormal growth of cells (tumors). Enlarged tissue at the back of the throat (adenoids). What increases the risk? You are more likely to develop this condition if: You smoke. You are overweight. You are a child who has: Certain birth defects of the mouth, such as cleft palate. Large tonsils or adenoids. What are the signs or symptoms? Common symptoms of this condition include: A feeling of fullness in the ear. Ear pain. Clicking or popping noises in the ear. Ringing in the ear (tinnitus). Hearing loss. Loss of balance. Dizziness. Symptoms may get worse when the air pressure around you changes, such as when you travel to an area of high elevation, fly on an airplane, or go scuba diving. How is this diagnosed? This condition may be diagnosed based on: Your symptoms. A physical exam of your ears, nose, and throat. Tests, such as those that measure: The movement of your eardrum. Your hearing (audiometry). How is this treated? Treatment depends on the cause and severity of your condition. In mild cases, you may relieve your symptoms by moving air into your ears. This is called "popping the ears." In more severe cases, or if you have symptoms of fluid in your ears, treatment may include: Medicines to relieve congestion (decongestants). Medicines that treat allergies (antihistamines). Nasal sprays or ear drops that contain medicines that reduce swelling (steroids). A procedure to drain the fluid in your eardrum. In this procedure, a small tube may be placed in the eardrum to: Drain the fluid. Restore the air in the middle ear space. A procedure to insert a  balloon device through the nose to inflate the opening of the eustachian tube (balloon dilation). Follow these instructions at home: Lifestyle Do not do any of the following until your health care provider approves: Travel to high altitudes. Fly in airplanes. Work in a Estate agent or room. Scuba dive. Do not use any products that contain nicotine or tobacco. These products include cigarettes, chewing tobacco, and vaping devices, such as e-cigarettes. If  you need help quitting, ask your health care provider. Keep your ears dry. Wear fitted earplugs during showering and bathing. Dry your ears completely after. General instructions Take over-the-counter and prescription medicines only as told by your health care provider. Use techniques to help pop your ears as recommended by your health care provider. These may include: Chewing gum. Yawning. Frequent, forceful swallowing. Closing your mouth, holding your nose closed, and gently blowing as if you are trying to blow air out of your nose. Keep all follow-up visits. This is important. Contact a health care provider if: Your symptoms do not go away after treatment. Your symptoms come back after treatment. You are unable to pop your ears. You have: A fever. Pain in your ear. Pain in your head or neck. Fluid draining from your ear. Your hearing suddenly changes. You become very dizzy. You lose your balance. Get help right away if: You have a sudden, severe increase in any of your symptoms. Summary Eustachian tube dysfunction refers to a condition in which a blockage develops in the eustachian tube. It can be caused by ear infections, allergies, inhaled irritants, or abnormal growths in the nose or throat. Symptoms may include ear pain or fullness, hearing loss, or ringing in the ears. Mild cases are treated with techniques to unblock the ears, such as yawning or chewing gum. More severe cases are treated with medicines or  procedures. This information is not intended to replace advice given to you by your health care provider. Make sure you discuss any questions you have with your health care provider. Document Revised: 10/23/2020 Document Reviewed: 10/23/2020 Elsevier Patient Education  2023 [employer] Lung Cancer Screening A lung cancer screening is a test that checks for lung cancer when there are no symptoms or history of that disease. The screening is done to look for lung cancer in its very early stages. Finding cancer early improves the chances of successful treatment. It may save your life. Who should have a screening? You should be screened for lung cancer if all of these apply: You currently smoke, or you have quit smoking within the past 15 years. You are between the ages of 41 and 51 years old. Screening may be recommended up to age 77 depending on your overall health and other factors. You have a smoking history of 1 pack of cigarettes a day for 20 years or 2 packs a day for 10 years. How is screening done?  The recommended screening test is a low-dose computed tomography (LDCT) scan. This scan takes detailed images of the lungs. This allows a health care provider to look for abnormal cells. If you are at risk for lung cancer, it is recommended that you get screened once a year. Talk to your health care provider about the risks, benefits, and limitations of screening. What are the benefits of screening? Screening can find lung cancer early, before symptoms start and before it has spread outside of the lungs. The chances of curing lung cancer are greater if the cancer is diagnosed early. What are the risks of screening? The screening may show lung cancer when no cancer is present. Talk with your health care provider about what your results mean. In some cases, your health care provider may do more testing to confirm the results. The screening may not find lung cancer when it is present. You will be  exposed to radiation from repeated LDCT tests, which can cause cancer in otherwise healthy people. How can I lower my risk  of lung cancer? Make these lifestyle changes to lower your risk of developing lung cancer: Do not use any products that contain nicotine or tobacco. These products include cigarettes, chewing tobacco, and vaping devices, such as e-cigarettes. If you need help quitting, ask your health care provider. Avoid secondhand smoke. Avoid exposure to radiation. Avoid exposure to radon gas. Have your home checked for radon regularly. Avoid things that cause cancer (carcinogens). Avoid living or working in places with high air pollution or diesel exhaust. Questions to ask your health care provider Am I eligible for lung cancer screening? Does my health insurance cover the cost of lung cancer screening? What happens if the lung cancer screening shows something of concern? How soon will I have results from my lung cancer screening? Is there anything that I need to do to prepare for my lung cancer screening? What happens if I decide not to have lung cancer screening? Where to find more information Ask your health care provider about the risks and benefits of screening. More information and resources are available from these organizations: American Cancer Society (ACS): www.cancer.org American Lung Association: www.lung.org National Cancer Institute: www.cancer.gov Contact a health care provider if: You start to show symptoms of lung cancer, including: A cough that will not go away. High-pitched whistling sounds when you breathe, most often when you breathe out (wheezing). Chest pain. Coughing up blood. Shortness of breath. Weight loss that cannot be explained. Constant tiredness (fatigue). Hoarse voice. Summary Lung cancer screening may find lung cancer before symptoms appear. Finding cancer early improves the chances of successful treatment. It may save your life. The  recommended screening test is a low-dose computed tomography (LDCT) scan that looks for abnormal cells in the lungs. If you are at risk for lung cancer, it is recommended that you get screened once a year. You can make lifestyle changes to lower your risk of lung cancer. Ask your health care provider about the risks and benefits of screening. This information is not intended to replace advice given to you by your health care provider. Make sure you discuss any questions you have with your health care provider. Document Revised: 01/31/2021 Document Reviewed: 01/31/2021 Elsevier Patient Education  2023 [employer].
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
## 2022-04-24 NOTE — Telephone Encounter (Addendum)
Patient seen for office visit 04/23/22 acute rhino sinusitis, eustachian tube dysfunction and bronchitis.  Started on augmentin continue flonase, nasal saline, oral antihistamine.  Meclizine helped with dizziness.  Patient reported feeling much better when lifting items from floor to table level on 04/23/22.

## 2022-05-08 NOTE — Telephone Encounter (Signed)
Patient seen in workcenter feeling better denied concerns. 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  No cough/congestion/throat clearing observed/audible. Encounter closed.

## 2022-07-23 ENCOUNTER — Telehealth: Payer: Self-pay | Admitting: Registered Nurse

## 2022-07-23 DIAGNOSIS — E7800 Pure hypercholesterolemia, unspecified: Secondary | ICD-10-CM

## 2022-07-23 MED ORDER — ATORVASTATIN CALCIUM 20 MG PO TABS: 20.0000 mg | ORAL_TABLET | Freq: Every day | ORAL | 0 refills | Status: DC

## 2022-07-23 NOTE — Telephone Encounter (Signed)
Patient requested refill atorvastatin 84m po daily saw PCM last week and had labs.  [facility] today atorvastatin 29mpo daily #90 RF0  Discussed results with patient and increasing fiber with grains/fruits/vegetables in diet.  Patient has greater than 150 minutes activity per week and is taking her atorvastatin daily.  [facility]  [facility] verbalized understanding information/instructions, agreed with plan of care and had no further questions at this time.  Cholesterol, Total 100 - 199 mg/dL 218 High    Triglycerides 0 - 149 mg/dL 133   HDL >39 mg/dL 47   VLDL Cholesterol Cal 5 - 40 mg/dL 24   LDL 0 - 99 mg/dL 147 High    LDL/HDL Ratio 0.0 - 3.2 ratio 3.1                                     LDL/HDL Ratio                                             Men  Women                               1/2 Avg.Risk  1.0    1.5                                   Avg.Risk  3.6    3.2                                2X Avg.Risk  6.2    5.0                                3X Avg.Risk  8.0    6.1  Resulting Agency  [facility]   Narrative Performed by [facility]  Glucose 70 - 99 mg/dL 82  BUN 8 - 27 mg/dL 11  Creatinine 0.57 - 1.00 mg/dL 0.68  eGFR >59 mL/min/1.73 100  BUN/Creatinine Ratio 12 - 28 16  Sodium 134 - 144 mmol/L 140  Potassium 3.5 - 5.2 mmol/L 4.5  Chloride 96 - 106 mmol/L 102  CO2 20 - 29 mmol/L 24  CALCIUM 8.7 - 10.3 mg/dL 9.5  Total Protein 6.0 - 8.5 g/dL 6.7  Albumin, Serum 3.8 - 4.9 g/dL 4.7  Globulin, Total 1.5 - 4.5 g/dL 2.0  Albumin/Globulin Ratio 1.2 - 2.2 2.4 High   Total Bilirubin 0.0 - 1.2 mg/dL 0.5  Alkaline Phosphatase 44 - 121 IU/L 64  AST 0 - 40 IU/L 17  ALT (SGPT) 0 - 32 IU/L 22  Resulting Agency  [facility]  Narrative Performed by [facility]   Ref Range & Units 2 wk ago   WBC 3.7 - 11.0 x 10^3/uL 8.0  % LYMPH No defined reference range  % 35.8  MID % No defined reference range % 7.0  % GRAN No defined reference range % 57.2  Lymphs Absolute 1.0 - 4.5  x 10^3/uL 2.8  MID # 0.1 - 1.5 X 10/L 0.7  Grans Absolute 1.5 - 7.5 x 10^3/uL 4.5  RBC 4.01 - 4.90 x10E6/uL 4.29  HGB 12.2 - 14.9 g/dL 14.1  Hematocrit 35.8 - 47.9 % 42.5  MCV 82.0 - 98.0 fL 99.1 Abnormal   MCH 27.0 - 33.0 pg 33.0  MCHC 31.0 - 37.0 g/dL 33.3  RDW 11.8 - 14.9 % 13.0  Platelet Count 150 - 400 x 10^3/uL 392  Resulting Agency  [facility]  Specimen Collected: 07/17/22 08:53   Performed by: [facility] Last Resulted: 07/17/22 09:02  Received From: [facility]  Result Received: 07/23/22 14:41

## 2022-08-02 ENCOUNTER — Encounter: Payer: Self-pay | Admitting: Registered Nurse

## 2022-08-06 ENCOUNTER — Ambulatory Visit: Payer: Self-pay | Admitting: Registered Nurse

## 2022-08-06 DIAGNOSIS — R300 Dysuria: Secondary | ICD-10-CM

## 2022-08-06 DIAGNOSIS — R109 Unspecified abdominal pain: Secondary | ICD-10-CM

## 2022-08-06 LAB — POCT URINALYSIS DIPSTICK OB
Bilirubin, UA: NEGATIVE
Blood, UA: NEGATIVE
Glucose, UA: NEGATIVE
Ketones, UA: NEGATIVE
Leukocytes, UA: NEGATIVE
Nitrite, UA: NEGATIVE
POC,PROTEIN,UA: NEGATIVE
Spec Grav, UA: 1.01 (ref 1.010–1.025)
Urobilinogen, UA: 0.2 E.U./dL
pH, UA: 8 (ref 5.0–8.0)

## 2022-08-06 MED ORDER — PHENAZOPYRIDINE HCL 200 MG PO TABS: 200.0000 mg | ORAL_TABLET | Freq: Three times a day (TID) | ORAL | 0 refills | Status: AC

## 2022-08-06 NOTE — Patient Instructions (Signed)
Dysuria Dysuria is pain or discomfort during urination. The pain or discomfort may be felt in the part of the body that drains urine from the bladder (urethra) or in the surrounding tissue of the genitals. The pain may also be felt in the groin area, lower abdomen, or lower back. You may have to urinate frequently or have the sudden feeling that you have to urinate (urgency). Dysuria can affect anyone, but it is more common in females. Dysuria can be caused by many different things, including: Urinary tract infection. Kidney stones or bladder stones. Certain STIs (sexually transmitted infections), such as chlamydia. Dehydration. Inflammation of the tissues of the vagina. Use of certain medicines. Use of certain soaps or scented products that cause irritation. Follow these instructions at home: Medicines Take over-the-counter and prescription medicines only as told by your health care provider. If you were prescribed an antibiotic medicine, take it as told by your health care provider. Do not stop taking the antibiotic even if you start to feel better. Eating and drinking  Drink enough fluid to keep your urine pale yellow. Avoid caffeinated beverages, tea, and alcohol. These beverages can irritate the bladder and make dysuria worse. In males, alcohol may irritate the prostate. General instructions Watch your condition for any changes. Urinate often. Avoid holding urine for long periods of time. If you are female, you should wipe from front to back after urinating or having a bowel movement. Use each piece of toilet paper only once. Empty your bladder after sex. Keep all follow-up visits. This is important. If you had any tests done to find the cause of dysuria, it is up to you to get your test results. Ask your health care provider, or the department that is doing the test, when your results will be ready. Contact a health care provider if: You have a fever. You develop pain in your back or  sides. You have nausea or vomiting. You have blood in your urine. You are not urinating as often as you usually do. Get help right away if: Your pain is severe and not relieved with medicines. You cannot eat or drink without vomiting. You are confused. You have a rapid heartbeat while resting. You have shaking or chills. You feel extremely weak. Summary Dysuria is pain or discomfort while urinating. Many different conditions can lead to dysuria. If you have dysuria, you may have to urinate frequently or have the sudden feeling that you have to urinate (urgency). Watch your condition for any changes. Keep all follow-up visits. Make sure that you urinate often and drink enough fluid to keep your urine pale yellow. This information is not intended to replace advice given to you by your health care provider. Make sure you discuss any questions you have with your health care provider. Document Revised: 03/24/2020 Document Reviewed: 03/24/2020 Elsevier Patient Education  2023 [employer] Flank Pain, Adult Flank pain is pain that is located on the side of the body between the upper abdomen and the spine. This area is called the flank. The pain may occur over a short period of time (acute), or it may be long-term or recurring (chronic). It may be mild or severe. Flank pain can be caused by many things, including: Muscle soreness or injury. Kidney infection, kidney stones, or kidney disease. Stress. A disease of the spine (vertebral disk disease). A lung infection (pneumonia). Fluid around the lungs (pulmonary edema). A skin rash caused by the chickenpox virus (shingles). Tumors that affect the back of the  abdomen. Gallbladder disease. Follow these instructions at home:  Drink enough fluid to keep your urine pale yellow. Rest as told by your health care provider. Take over-the-counter and prescription medicines only as told by your health care provider. Keep a journal to track what has  caused your flank pain and what has made it feel better. Keep all follow-up visits. This is important. Contact a health care provider if: Your pain is not controlled with medicine. You have new symptoms. Your pain gets worse. Your symptoms last longer than 2-3 days. You have trouble urinating or you are urinating very frequently. Get help right away if: You have trouble breathing or you are short of breath. Your abdomen hurts or it is swollen or red. You have nausea or vomiting. You feel faint, or you faint. You have blood in your urine. You have flank pain and a fever. These symptoms may represent a serious problem that is an emergency. Do not wait to see if the symptoms will go away. Get medical help right away. Call your local emergency services (911 in the [place] Do not drive yourself to the hospital. Summary Flank pain is pain that is located on the side of the body between the upper abdomen and the spine. The pain may occur over a short period of time (acute), or it may be long-term or recurring (chronic). It may be mild or severe. Flank pain can be caused by many things. Contact your health care provider if your symptoms get worse or last longer than 2-3 days. This information is not intended to replace advice given to you by your health care provider. Make sure you discuss any questions you have with your health care provider. Document Revised: 10/23/2020 Document Reviewed: 10/23/2020 Elsevier Patient Education  [place].

## 2022-08-06 NOTE — Progress Notes (Signed)
Subjective:    Patient ID: Rebecca Sweeney, female    DOB: 1961/01/17, 61 y.o.   MRN: [id number]  61y/o caucasian female established patient here for right back pain/dysuria.  Denied fever/chills/n/v/d.  Tolerating po intake without difficulty.  Denied hand/feet swelling, headache, gross hematuria.      Review of Systems  Constitutional:  Negative for activity change, appetite change, chills, diaphoresis, fatigue and fever.  HENT:  Negative for trouble swallowing and voice change.   Respiratory:  Negative for cough, shortness of breath, wheezing and stridor.   Cardiovascular:  Negative for leg swelling.  Gastrointestinal:  Positive for abdominal pain. Negative for abdominal distention, diarrhea, nausea and vomiting.  Endocrine: Positive for cold intolerance.  Genitourinary:  Positive for dysuria and flank pain. Negative for genital sores, hematuria, menstrual problem, vaginal bleeding, vaginal discharge and vaginal pain.  Musculoskeletal:  Positive for back pain. Negative for gait problem, neck pain and neck stiffness.  Skin:  Negative for rash.  Allergic/Immunologic: Negative for food allergies.  Neurological:  Negative for dizziness, weakness, light-headedness and headaches.  Hematological:  Negative for adenopathy. Does not bruise/bleed easily.  Psychiatric/Behavioral:  Negative for agitation, confusion and sleep disturbance.        Objective:   Physical Exam Vitals and nursing note reviewed.  Constitutional:      General: She is awake. She is not in acute distress.    Appearance: Normal appearance. She is well-developed, well-groomed and normal weight. She is not ill-appearing, toxic-appearing or diaphoretic.  HENT:     Head: Normocephalic and atraumatic.     Jaw: There is normal jaw occlusion.     Salivary Glands: Right salivary gland is not diffusely enlarged. Left salivary gland is not diffusely enlarged.     Right Ear: Hearing and external ear normal.     Left Ear:  Hearing and external ear normal.     Nose: Nose normal. No congestion or rhinorrhea.     Mouth/Throat:     Lips: Pink. No lesions.     Mouth: Mucous membranes are moist.     Pharynx: Oropharynx is clear.  Eyes:     General: Lids are normal. Vision grossly intact. Gaze aligned appropriately. No scleral icterus.       Right eye: No discharge.        Left eye: No discharge.     Extraocular Movements: Extraocular movements intact.     Conjunctiva/sclera: Conjunctivae normal.     Pupils: Pupils are equal, round, and reactive to light.  Neck:     Trachea: Trachea normal.  Cardiovascular:     Rate and Rhythm: Normal rate and regular rhythm.  Pulmonary:     Effort: Pulmonary effort is normal.     Breath sounds: Normal breath sounds and air entry. No stridor or transmitted upper airway sounds. No wheezing.     Comments: Spoke full sentences without difficulty; no cough observed in exam room Abdominal:     General: Abdomen is flat. Bowel sounds are normal. There is no distension.     Palpations: Abdomen is soft. There is no shifting dullness, fluid wave, hepatomegaly, splenomegaly, mass or pulsatile mass.     Tenderness: There is abdominal tenderness in the right lower quadrant and suprapubic area. There is right CVA tenderness. There is no left CVA tenderness, guarding or rebound.     Hernia: No hernia is present.     Comments: Mild right CVA tenderness with percussion; mild RLQ/suprapubic tenderness with palpation; normoactive bowel sounds x 4;  dull to percussion x4 quads  Musculoskeletal:        General: Normal range of motion.     Right hand: Normal strength. Normal capillary refill.     Left hand: Normal strength. Normal capillary refill.     Cervical back: Normal range of motion and neck supple. No swelling, edema, deformity, erythema, signs of trauma, lacerations, rigidity, spasms, tenderness or crepitus. No pain with movement. Normal range of motion.     Thoracic back: Tenderness  present. No swelling, edema, deformity, signs of trauma, lacerations or spasms. Normal range of motion.     Lumbar back: No swelling, edema, deformity, signs of trauma, lacerations or spasms. Normal range of motion.       Back:     Right lower leg: No edema.     Left lower leg: No edema.  Lymphadenopathy:     Head:     Right side of head: No submandibular or preauricular adenopathy.     Left side of head: No submandibular or preauricular adenopathy.     Cervical:     Right cervical: No superficial cervical adenopathy.    Left cervical: No superficial cervical adenopathy.  Skin:    General: Skin is warm and dry.     Capillary Refill: Capillary refill takes less than 2 seconds.     Coloration: Skin is not ashen, cyanotic, jaundiced, mottled, pale or sallow.     Findings: No abrasion, abscess, acne, bruising, burn, ecchymosis, erythema, signs of injury, laceration, lesion, petechiae, rash or wound.     Nails: There is no clubbing.  Neurological:     General: No focal deficit present.     Mental Status: She is alert and oriented to person, place, and time. Mental status is at baseline.     GCS: GCS eye subscore is 4. GCS verbal subscore is 5. GCS motor subscore is 6.     Cranial Nerves: Cranial nerves 2-12 are intact. No cranial nerve deficit, dysarthria or facial asymmetry.     Sensory: Sensation is intact.     Motor: Motor function is intact. No weakness, tremor, atrophy, abnormal muscle tone or seizure activity.     Coordination: Coordination is intact. Coordination normal.     Gait: Gait is intact. Gait normal.     Comments: In/out of chair and on/off exam table without difficulty; gait sure and steady in clinic; bilateral hand grasp equal 5/5; standing/sitting/supine and reversed quickly without assist on exam table  Psychiatric:        Attention and Perception: Attention and perception normal.        Mood and Affect: Mood and affect normal.        Speech: Speech normal.         Behavior: Behavior normal. Behavior is cooperative.        Thought Content: Thought content normal.        Cognition and Memory: Cognition and memory normal.        Judgment: Judgment normal.       Latest Reference Range & Units 08/06/22 11:10  Bilirubin, UA  negative  Clarity, UA  clear  Color, UA  pale yellow  Glucose, UA Negative  Negative  Ketones, UA  negative  Leukocytes,UA Negative  Negative  Nitrite, UA  negative  pH, UA 5.0 - 8.0  8.0  Protein Negative, Trace, Small (1+), Moderate (2+), Large (3+), 4+  Negative  Specific Gravity, UA 1.010 - 1.025  1.010  Urobilinogen, UA 0.2 or 1.0 E.U./dL  0.2  RBC, UA  negative  Results discussed verbally with patient in clinic normal except pH high normal.  Urine culture sent to [facility] typically 72 hours for results will call/send message once available.  Patient verbalized understanding information/instructions, agreed with plan of care and had no further questions at this time.     Assessment & Plan:  A-acute right flank pain, dysuria  P-dipstick urinalysis and urine culture ordered.  Patient gave clean catch sample to RN Kimrey.  Medications as directed. Patient is also to push fluids and may use Pyridium 200mg  po TID as needed #6 RF0 sent to her pharmacy of choice. Hydrate, avoid dehydration. Avoid holding urine void on frequent basis every 4 to 6 hours. If unable to void every 8 hours follow up for re-evaluation with PCM, urgent care or ER. Call or return to clinic as needed if these symptoms worsen or fail to improve as anticipated. Exitcare handouts printed and given on dysuria and flank pain. Patient verbalized agreement and understanding of treatment plan and had no further questions at this time.  P2: Hydrate and cranberry juice

## 2022-08-09 ENCOUNTER — Telehealth: Payer: Self-pay | Admitting: Registered Nurse

## 2022-08-09 DIAGNOSIS — N390 Urinary tract infection, site not specified: Secondary | ICD-10-CM

## 2022-08-09 LAB — URINE CULTURE

## 2022-08-09 MED ORDER — NITROFURANTOIN MONOHYD MACRO 100 MG PO CAPS: 100.0000 mg | ORAL_CAPSULE | Freq: Two times a day (BID) | ORAL | 0 refills | Status: AC

## 2022-08-09 NOTE — Telephone Encounter (Signed)
Patient notified e coli grew out on urine culture 100K denied symptoms on pyridium.  Electronic rx sent to her pharmacy of choice macrobid 100mg  po BID x 10 days #20 RF0  Discussed push water.  Follow up if new symptoms for re-evaluation e.g. n/v/d/fever/chills/headache unable to void every 8 hours.  Patient verbalized understanding information/instructions, agreed with plan of care and had no further questions at this time.  Urine Culture, Routine Final report Abnormal   Organism ID, Bacteria Escherichia coli Abnormal   Comment: Cefazolin <=4 ug/mL Cefazolin with an MIC <=16 predicts susceptibility to the oral agents cefaclor, cefdinir, cefpodoxime, cefprozil, cefuroxime, cephalexin, and loracarbef when used for therapy of uncomplicated urinary tract infections due to E. coli, Klebsiella pneumoniae, and Proteus mirabilis. 50,000-100,000 colony forming units per mL  Antimicrobial Susceptibility Comment  Comment:       ** S = Susceptible; I = Intermediate; R = Resistant **                    P = Positive; N = Negative             MICS are expressed in micrograms per mL    Antibiotic                 RSLT#1    RSLT#2    RSLT#3    RSLT#4 Amoxicillin/Clavulanic Acid    S Ampicillin                     R Cefepime                       S Ceftriaxone                    S Cefuroxime                     S Ciprofloxacin                  S Ertapenem                      S Gentamicin                     R Imipenem                       S Levofloxacin                   S Meropenem                      S Nitrofurantoin                 S Piperacillin/Tazobactam        S Tetracycline                   R Tobramycin                     S Trimethoprim/Sulfa             R  Resulting Agency [facility]

## 2022-08-13 ENCOUNTER — Ambulatory Visit: Payer: Self-pay | Admitting: Occupational Medicine

## 2022-08-13 DIAGNOSIS — S61219A Laceration without foreign body of unspecified finger without damage to nail, initial encounter: Secondary | ICD-10-CM

## 2022-08-13 NOTE — Progress Notes (Signed)
Pt reports cut on top right middle finger bleeding still. Applied gauze and pressure. Bleeding stopped except slight oozing. Cleaned with antiseptic. Applied steri strips due to laceration with closed skin margins. Applied finger Band-Aid and finger splint. Told to her to limit mobility today and follow up with tomorrow.  Watch for signs of infection. Tdap last 2018. Told to fill out incident report. Given tylenol for pain.

## 2022-08-14 ENCOUNTER — Ambulatory Visit: Payer: Self-pay | Admitting: Occupational Medicine

## 2022-08-14 DIAGNOSIS — S61212D Laceration without foreign body of right middle finger without damage to nail, subsequent encounter: Secondary | ICD-10-CM

## 2022-08-14 NOTE — Progress Notes (Signed)
When removing bandage steri strips started coming off and open wound and started to bleed. Applied pressure bleeding stopped. Changed Dressing. Kept steri strips in place and applied a non adherent dressing and tape. Noted other band aid pulled on steri strips that open wound.  Patient educated to keep clean and dry. Will follow up tomorrow.

## 2022-08-15 ENCOUNTER — Encounter: Payer: Self-pay | Admitting: Registered Nurse

## 2022-08-15 ENCOUNTER — Ambulatory Visit: Payer: No Typology Code available for payment source | Admitting: Registered Nurse

## 2022-08-15 DIAGNOSIS — N390 Urinary tract infection, site not specified: Secondary | ICD-10-CM

## 2022-08-15 DIAGNOSIS — S61210A Laceration without foreign body of right index finger without damage to nail, initial encounter: Secondary | ICD-10-CM

## 2022-08-15 MED ORDER — TRIPLE ANTIBIOTIC 5-400-5000 EX OINT: TOPICAL_OINTMENT | Freq: Two times a day (BID) | CUTANEOUS | 0 refills | Status: AC | PRN

## 2022-08-15 NOTE — Progress Notes (Signed)
Subjective:    Patient ID: Rebecca Sweeney, female    DOB: 07-09-1961, 61 y.o.   MRN: [id number]  61y/o caucasian female established patient saw RN Kimrey for laceration index finger from broken [place] at work earlier this week.  Here for re-evaluation as bled a lot after dressing change with RN yesterday.  Denied swelling/fever/chills/worsening pain/tingling/numbness to finger.  Started antibiotic for urine infection yesterday.  Right flank/low back pain mild improving.  Denied hand/feet swelling/headache/dysuria.     Review of Systems  Constitutional:  Negative for activity change, appetite change, chills, diaphoresis, fatigue and fever.  HENT:  Negative for trouble swallowing and voice change.   Respiratory:  Negative for cough.   Cardiovascular:  Negative for chest pain and leg swelling.  Gastrointestinal:  Negative for diarrhea, nausea and vomiting.  Genitourinary:  Positive for flank pain.  Musculoskeletal:  Positive for myalgias. Negative for gait problem, neck pain and neck stiffness.  Skin:  Positive for wound. Negative for color change, pallor and rash.  Neurological:  Negative for tremors, weakness and headaches.  Psychiatric/Behavioral:  Negative for agitation, confusion and sleep disturbance.        Objective:   Physical Exam Vitals and nursing note reviewed.  Constitutional:      General: She is awake. She is not in acute distress.    Appearance: Normal appearance. She is well-developed and well-groomed. She is not ill-appearing, toxic-appearing or diaphoretic.  HENT:     Head: Normocephalic and atraumatic.     Jaw: There is normal jaw occlusion.     Salivary Glands: Right salivary gland is not diffusely enlarged. Left salivary gland is not diffusely enlarged.     Right Ear: Hearing and external ear normal.     Left Ear: Hearing and external ear normal.     Nose: Nose normal. No congestion or rhinorrhea.     Mouth/Throat:     Lips: Pink. No lesions.     Mouth: Mucous  membranes are moist.     Pharynx: Oropharynx is clear.  Eyes:     General: Lids are normal. Vision grossly intact. Gaze aligned appropriately. No scleral icterus.       Right eye: No discharge.        Left eye: No discharge.     Extraocular Movements: Extraocular movements intact.     Conjunctiva/sclera: Conjunctivae normal.     Pupils: Pupils are equal, round, and reactive to light.  Neck:     Trachea: Trachea normal.  Cardiovascular:     Rate and Rhythm: Normal rate and regular rhythm.     Pulses: Normal pulses.  Pulmonary:     Effort: Pulmonary effort is normal.     Breath sounds: Normal breath sounds and air entry. No stridor or transmitted upper airway sounds. No wheezing.     Comments: Spoke full sentences without difficulty; no cough observed in exam room Abdominal:     General: Abdomen is flat.  Musculoskeletal:        General: Normal range of motion.     Cervical back: Normal range of motion and neck supple.  Lymphadenopathy:     Head:     Right side of head: No submandibular or preauricular adenopathy.     Left side of head: No submandibular or preauricular adenopathy.     Cervical:     Right cervical: No superficial cervical adenopathy.    Left cervical: No superficial cervical adenopathy.  Skin:    General: Skin is warm and dry.  Capillary Refill: Capillary refill takes less than 2 seconds.     Coloration: Skin is not ashen, cyanotic, jaundiced, mottled, pale or sallow.     Findings: Signs of injury and laceration present. No abrasion, abscess, acne, bruising, burn, ecchymosis, erythema, lesion, petechiae, rash or wound. Rash is not crusting, macular, nodular, papular, purpuric, pustular, scaling, urticarial or vesicular.     Nails: There is no clubbing.     Comments: U shaped 0.5cm diameter flap wound edges well approximated right 2nd digit dorsum and distal to DIP joint no nail damage; steristripsx2 intact caked in dried blood after bandaid removed; removed soiled  steristrips after letting soak in first aid spray and water; no bleeding skin dried/cleansed with first aid spray and 2 new steristrips applied no blood loss/bleeding and coverage with fingertip bandage.  No edema capillary refill less than 2 seconds  Neurological:     General: No focal deficit present.     Mental Status: She is alert and oriented to person, place, and time. Mental status is at baseline.     GCS: GCS eye subscore is 4. GCS verbal subscore is 5. GCS motor subscore is 6.     Cranial Nerves: Cranial nerves 2-12 are intact. No cranial nerve deficit, dysarthria or facial asymmetry.     Sensory: Sensation is intact.     Motor: Motor function is intact. No weakness, tremor, atrophy, abnormal muscle tone or seizure activity.     Coordination: Coordination is intact. Coordination normal.     Gait: Gait is intact. Gait normal.     Comments: In/out of chair without difficulty; gait sure and steady in clinic; bilateral hand grasp equal 5/5  Psychiatric:        Attention and Perception: Attention and perception normal.        Mood and Affect: Mood and affect normal.        Speech: Speech normal.        Behavior: Behavior normal. Behavior is cooperative.        Thought Content: Thought content normal.        Cognition and Memory: Cognition and memory normal.        Judgment: Judgment normal.          Assessment & Plan:   A-finger laceration right index without foreign body of nail damage, UTI without hematuria  P- Do not soak right hand lacerations in dirty water until healed avoid pool, lake, hot tub, dirty sink water. May shower apply neosporin or bactroban BID keep wounds covered they will heal faster and prevent contamination rubbing from clothing tearing off scabs.  Given triple antibiotic and bandaids from clinic stop change at least daily after washing with soap and water.  Change prn soiling. Exitcare handout laceration. Medications as directed. Call or return to clinic as  needed if these symptoms worsen or fail to improve as anticipated. Patient verbalized agreement and understanding of treatment plan. P2: ROM, injury prevention    Feeling better symptoms improving on nitrofurantoin 100mg  po BID x 10 days.  Pushing water to keep urine pale yellow and clear and voiding every 2-4 hours when awake..  Follow up if no symptom resolution or worsening.  Patient verbalized understanding information/instructions, agreed with plan of care and had no further questions at this time.

## 2022-08-15 NOTE — Patient Instructions (Signed)
Laceration Care, Adult A laceration is a cut that may go through all layers of the skin and into the tissue that is right under the skin. Some lacerations heal on their own. Others need to be closed with stitches (sutures), staples, skin adhesive strips, or skin glue. Proper care of a laceration reduces the risk for infection, helps the laceration heal better, and may prevent scarring. General tips Keep the wound clean and dry. Do not scratch or pick at the wound. Wash your hands with soap and water for at least 20 seconds before and after touching your wound or changing your bandage (dressing). If soap and water are not available, use hand sanitizer. Do not usedisinfectants or antiseptics, such as rubbing alcohol, to clean your wound unless told by your health care provider. If you were given a dressing, you should change it at least once a day, or as told by your health care provider. You should also change it if it becomes wet or dirty. How to care for your laceration If sutures or staples were used: Keep the wound completely dry for the first 24 hours, or as told by your health care provider. After that time, you may shower or bathe. Do not soak your wound in water until after the sutures or staples have been removed. Clean the wound once each day, or as told by your health care provider. To do this: Wash the wound with soap and water. Rinse the wound with water to remove all soap. Pat the wound dry with a clean towel. Do not rub the wound. After cleaning the wound, apply a thin layer of antibiotic ointment, other topical ointments, or a non-adherent dressing as told by your health care provider. This will help prevent infection and keep the dressing from sticking to the wound. Have the sutures or staples removed as told by your health care provider. Do not  remove sutures or staples yourself. If skin adhesive strips were used: Do not get the skin adhesive strips wet. You may shower or bathe,  but keep the wound dry. If the wound gets wet, pat it dry with a clean towel. Do not rub the wound. Skin adhesive strips fall off on their own. If adhesive strip edges start to loosen and curl up, you may trim the loose edges. Do not remove adhesive strips completely unless your health care provider tells you to do that. If skin glue was used: You may shower or bathe, but try to keep the wound dry. Do not soak the wound in water. After showering or bathing, pat the wound dry with a clean towel. Do not rub the wound. Do not do any activities that will make you sweat a lot until the skin glue has fallen off. Do not apply liquid, cream, or ointment medicine to the wound while the skin glue is in place. Doing this may loosen the film before the wound has healed. If a dressing is placed over the wound, do not apply tape directly over the skin glue. Doing this may cause the glue to be pulled off before the wound has healed. Do not pick at the glue. Skin glue usually remains in place for 5-10 days and then falls off the skin. Follow these instructions at home: Medicines Take over-the-counter and prescription medicines only as told by your health care provider. If you were prescribed an antibiotic medicine or ointment, take or apply it as told by your health care provider. Do not stop using it even if   your condition improves. Managing pain and swelling If directed, put ice on the injured area. To do this: Put ice in a plastic bag. Place a towel between your skin and the bag. Leave the ice on for 20 minutes, 2-3 times a day. Remove the ice if your skin turns bright red. This is very important. If you cannot feel pain, heat, or cold, you have a greater risk of damage to the area. Raise (elevate) the injured area above the level of your heart while you are sitting or lying down for the first 24-48 hours after the laceration is repaired. General instructions  Avoid any activity that could cause your wound  to reopen. Check your wound every day for signs of infection. Watch for: More redness, swelling, or pain. Fluid or blood. Warmth. Pus or a bad smell. Keep all follow-up visits. This is important. Contact a health care provider if: You received a tetanus shot and you have swelling, severe pain, redness, or bleeding at the injection site. Your closed wound breaks open. You have any of these signs of infection: More redness, swelling, or pain around your wound. Fluid or blood coming from your wound. Warmth coming from your wound. Pus or a bad smell coming from your wound. A fever. You notice something coming out of the wound, such as wood or glass. Your pain is not controlled with medicine. You notice a change in the color of your skin near your wound. You need to change the dressing often. You develop a new rash. You have numbness around the wound. Get help right away if: You develop severe swelling around the wound. Your pain suddenly increases and is severe. You develop painful lumps near the wound or on skin anywhere else on your body. You have a red streak going away from your wound. The wound is on your hand or foot, and you cannot properly move a finger or toe. The wound is on your hand or foot, and you notice that your fingers or toes look pale or bluish. Summary A laceration is a cut that may go through all layers of the skin and into the tissue that is right under the skin. Some lacerations heal on their own. Others need to be closed with stitches (sutures), staples, skin adhesive strips, or skin glue. Proper care of a laceration reduces the risk of infection, helps the laceration heal better, and may prevent scarring. This information is not intended to replace advice given to you by your health care provider. Make sure you discuss any questions you have with your health care provider. Document Revised: 10/19/2020 Document Reviewed: 10/19/2020 Elsevier Patient Education   2023 [employer]  

## 2022-10-07 ENCOUNTER — Ambulatory Visit: Payer: Self-pay | Admitting: Occupational Medicine

## 2022-10-07 DIAGNOSIS — Z0000 Encounter for general adult medical examination without abnormal findings: Secondary | ICD-10-CM

## 2022-10-07 NOTE — Progress Notes (Signed)
Went over and assisted with smoke cessation video and quiz. Coming back to tomorrow for be well labs.

## 2022-10-08 ENCOUNTER — Ambulatory Visit: Payer: No Typology Code available for payment source | Admitting: Occupational Medicine

## 2022-10-08 DIAGNOSIS — Z0000 Encounter for general adult medical examination without abnormal findings: Secondary | ICD-10-CM

## 2022-10-08 NOTE — Progress Notes (Signed)
Lab drawn from Left AC tolerated well no issues noted.

## 2022-10-09 ENCOUNTER — Other Ambulatory Visit: Payer: Self-pay | Admitting: Registered Nurse

## 2022-10-09 ENCOUNTER — Other Ambulatory Visit: Payer: Self-pay | Admitting: Occupational Medicine

## 2022-10-09 DIAGNOSIS — Z0000 Encounter for general adult medical examination without abnormal findings: Secondary | ICD-10-CM

## 2022-10-09 DIAGNOSIS — E559 Vitamin D deficiency, unspecified: Secondary | ICD-10-CM

## 2022-10-09 LAB — CMP12+LP+TP+TSH+6AC+CBC/D/PLT
ALT: 30 IU/L (ref 0–32)
AST: 28 IU/L (ref 0–40)
Albumin/Globulin Ratio: 1.8 (ref 1.2–2.2)
Albumin: 4.5 g/dL (ref 3.8–4.9)
Alkaline Phosphatase: 73 IU/L (ref 44–121)
BUN/Creatinine Ratio: 14 (ref 12–28)
BUN: 13 mg/dL (ref 8–27)
Basophils Absolute: 0 10*3/uL (ref 0.0–0.2)
Basos: 0 %
Bilirubin Total: 0.6 mg/dL (ref 0.0–1.2)
Calcium: 9.2 mg/dL (ref 8.7–10.3)
Chloride: 100 mmol/L (ref 96–106)
Chol/HDL Ratio: 3.4 ratio (ref 0.0–4.4)
Cholesterol, Total: 182 mg/dL (ref 100–199)
Creatinine, Ser: 0.95 mg/dL (ref 0.57–1.00)
EOS (ABSOLUTE): 0.1 10*3/uL (ref 0.0–0.4)
Eos: 2 %
Estimated CHD Risk: 0.5 times avg. (ref 0.0–1.0)
Free Thyroxine Index: 1.6 (ref 1.2–4.9)
GGT: 39 IU/L (ref 0–60)
Globulin, Total: 2.5 g/dL (ref 1.5–4.5)
Glucose: 100 mg/dL — ABNORMAL HIGH (ref 70–99)
HDL: 53 mg/dL (ref >39)
Hematocrit: 40.6 % (ref 34.0–46.6)
Hemoglobin: 13.4 g/dL (ref 11.1–15.9)
Immature Grans (Abs): 0 10*3/uL (ref 0.0–0.1)
Immature Granulocytes: 0 %
Iron: 93 ug/dL (ref 27–159)
LDH: 171 IU/L (ref 119–226)
LDL Chol Calc (NIH): 88 mg/dL (ref 0–99)
Lymphocytes Absolute: 2.6 10*3/uL (ref 0.7–3.1)
Lymphs: 36 %
MCH: 32.5 pg (ref 26.6–33.0)
MCHC: 33 g/dL (ref 31.5–35.7)
MCV: 99 fL — ABNORMAL HIGH (ref 79–97)
Monocytes Absolute: 0.4 10*3/uL (ref 0.1–0.9)
Monocytes: 6 %
Neutrophils Absolute: 4 10*3/uL (ref 1.4–7.0)
Neutrophils: 56 %
Phosphorus: 3.7 mg/dL (ref 3.0–4.3)
Platelets: 328 10*3/uL (ref 150–450)
Potassium: 3.9 mmol/L (ref 3.5–5.2)
RBC: 4.12 x10E6/uL (ref 3.77–5.28)
RDW: 12.2 % (ref 11.7–15.4)
Sodium: 139 mmol/L (ref 134–144)
T3 Uptake Ratio: 28 % (ref 24–39)
T4, Total: 5.7 ug/dL (ref 4.5–12.0)
TSH: 2.92 u[IU]/mL (ref 0.450–4.500)
Total Protein: 7 g/dL (ref 6.0–8.5)
Triglycerides: 245 mg/dL — ABNORMAL HIGH (ref 0–149)
Uric Acid: 6.3 mg/dL (ref 3.0–7.2)
VLDL Cholesterol Cal: 41 mg/dL — ABNORMAL HIGH (ref 5–40)
WBC: 7.2 10*3/uL (ref 3.4–10.8)
eGFR: 69 mL/min/{1.73_m2} (ref >59)

## 2022-10-09 LAB — MAGNESIUM: Magnesium: 2.1 mg/dL (ref 1.6–2.3)

## 2022-10-09 LAB — VITAMIN D 25 HYDROXY (VIT D DEFICIENCY, FRACTURES): Vit D, 25-Hydroxy: 27.3 ng/mL — ABNORMAL LOW (ref 30.0–100.0)

## 2022-10-09 MED ORDER — D3-50 1.25 MG (50000 UT) PO CAPS: 50000.0000 [IU] | ORAL_CAPSULE | ORAL | 1 refills | Status: DC

## 2022-10-09 NOTE — Progress Notes (Signed)
Add on test

## 2022-10-10 ENCOUNTER — Ambulatory Visit: Payer: No Typology Code available for payment source | Admitting: Occupational Medicine

## 2022-10-10 ENCOUNTER — Telehealth: Payer: Self-pay | Admitting: Registered Nurse

## 2022-10-10 DIAGNOSIS — E7800 Pure hypercholesterolemia, unspecified: Secondary | ICD-10-CM

## 2022-10-10 DIAGNOSIS — E559 Vitamin D deficiency, unspecified: Secondary | ICD-10-CM

## 2022-10-10 DIAGNOSIS — Z0000 Encounter for general adult medical examination without abnormal findings: Secondary | ICD-10-CM

## 2022-10-10 MED ORDER — VITAMIN D3 125 MCG (5000 UT) PO CAPS: 5000.0000 [IU] | ORAL_CAPSULE | Freq: Every day | ORAL | 11 refills | Status: AC

## 2022-10-10 NOTE — Progress Notes (Signed)
Noted patient to start 50,000 units po weekly vitamin D rx sent to her pharmacy of choice and continue 5000 units po daily with meal.

## 2022-10-10 NOTE — Progress Notes (Signed)
[employer] insurance premium discount evaluation:   Patient is smoker tobacco cessation alterative completed.  Epic reviewed by RN Evlyn Kanner and transcribed Labs. Reviewed labs with patient.  Fasting glucose 100 prev 122 (improved).  Lipids still elevated but improved.  Rx for vitamin D 50,000 units po weekly sent to patient pharmacy repeat level in 6 to 12 months.   patient taking Vitamin 5000 daily not 1000 unit Otila Kluver NP made aware.  Copy of labs given to patient. Follow up with PCM elevated cholesterol, glucose, MCV. Atorvastatin has helped cholesterol.  I recommend exercise 150 minutes per week; dietary fiber daily by mouth 20 grams women per up to date; eat whole grains/fruits/vegetables; keep added sugars to less than 100 calories/ 5 teaspoons for women per American Heart Association; electrolytes, iron, kidney/liver function, thyroid normal.  No infection or anemia noted on complete blood count.   Tobacco attestation signed. [facility] ROI formed signed. Forms placed in the chart.   Patient given handouts for [facility] pharmacies and discount drugs list,MyChart set up, Tele doc setup, Tele doc Behavioral, [place] counseling and [facility] counseling.  What to do for infectious illness protocol. Given handout for list of medications that can be filled at [facility]. Given Clinic hours and Clinic Email.

## 2022-10-10 NOTE — Telephone Encounter (Signed)
RN Kimrey notified patient to take 50,000 units vitamin D3 weekly with meal and daily 5000 units with meal and recheck level in 3 months.  Rx was sent to patient pharmacy of choice for 50,000 units.  Level still low. 27 goal 40-60 blood level.  Rx has 1 refill.

## 2022-10-11 LAB — SPECIMEN STATUS REPORT

## 2022-10-11 LAB — HGB A1C W/O EAG: Hgb A1c MFr Bld: 5.5 % (ref 4.8–5.6)

## 2022-11-06 NOTE — Progress Notes (Signed)
Noted patient rx 04/14/22 atorvastatin '20mg'$  po daily Rx

## 2022-11-14 ENCOUNTER — Encounter: Payer: Self-pay | Admitting: Registered Nurse

## 2022-11-14 ENCOUNTER — Ambulatory Visit: Payer: Self-pay | Admitting: Registered Nurse

## 2022-11-14 VITALS — BP 108/60 | HR 76 | Temp 98.4°F | Resp 18

## 2022-11-14 DIAGNOSIS — R1030 Lower abdominal pain, unspecified: Secondary | ICD-10-CM

## 2022-11-14 LAB — POCT URINALYSIS DIPSTICK
Bilirubin, UA: NEGATIVE
Blood, UA: NEGATIVE
Glucose, UA: NEGATIVE
Ketones, UA: NEGATIVE
Leukocytes, UA: NEGATIVE
Nitrite, UA: NEGATIVE
Protein, UA: NEGATIVE
Spec Grav, UA: <=1.005 — AB (ref 1.010–1.025)
Urobilinogen, UA: 0.2 E.U./dL
pH, UA: 6.5 (ref 5.0–8.0)

## 2022-11-14 MED ORDER — IBUPROFEN 800 MG PO TABS: 800.0000 mg | ORAL_TABLET | Freq: Three times a day (TID) | ORAL | 0 refills | Status: AC | PRN

## 2022-11-14 NOTE — Patient Instructions (Signed)
Inguinal Hernia, Adult An inguinal hernia develops when fat or the intestines push through a weak spot in a muscle where the leg meets the lower abdomen (groin). This creates a bulge. This kind of hernia could also be: In the scrotum, if you are female. In folds of skin around the vagina, if you are female. There are three types of inguinal hernias: Hernias that can be pushed back into the abdomen (are reducible). This type rarely causes pain. Hernias that are not reducible (are incarcerated). Hernias that are not reducible and lose their blood supply (are strangulated). This type of hernia requires emergency surgery. What are the causes? This condition is caused by having a weak spot in the muscles or tissues in your groin. This develops over time. The hernia may poke through the weak spot when you suddenly strain your lower abdominal muscles, such as when you: Lift a heavy object. Strain to have a bowel movement. Constipation can lead to straining. Cough. What increases the risk? This condition is more likely to develop in: Males. Pregnant females. People who: Are overweight. Work in jobs that require long periods of standing or heavy lifting. Have had an inguinal hernia before. Smoke or have lung disease. These factors can lead to long-term (chronic) coughing. What are the signs or symptoms? Symptoms may depend on the size of the hernia. Often, a small inguinal hernia has no symptoms. Symptoms of a larger hernia may include: A bulge in the groin area. This is easier to see when standing. It might not be visible when lying down. Pain or burning in the groin. This may get worse when lifting, straining, or coughing. A dull ache or a feeling of pressure in the groin. An unusual bulge in the scrotum, in males. Symptoms of a strangulated inguinal hernia may include: A bulge in your groin that is very painful and tender to the touch. A bulge that turns red or purple. Fever, nausea, and  vomiting. Inability to have a bowel movement or to pass gas. How is this diagnosed? This condition is diagnosed based on your symptoms, your medical history, and a physical exam. Your health care provider may feel your groin area and ask you to cough. How is this treated? Treatment depends on the size of your hernia and whether you have symptoms. If you do not have symptoms, your health care provider may have you watch your hernia carefully and have you come in for follow-up visits. If your hernia is large or if you have symptoms, you may need surgery to repair the hernia. Follow these instructions at home: Lifestyle Avoid lifting heavy objects. Avoid standing for long periods of time. Do not use any products that contain nicotine or tobacco. These products include cigarettes, chewing tobacco, and vaping devices, such as e-cigarettes. If you need help quitting, ask your health care provider. Maintain a healthy weight. Preventing constipation You may need to take these actions to prevent or treat constipation: Drink enough fluid to keep your urine pale yellow. Take over-the-counter or prescription medicines. Eat foods that are high in fiber, such as beans, whole grains, and fresh fruits and vegetables. Limit foods that are high in fat and processed sugars, such as fried or sweet foods. General instructions You may try to push the hernia back in place by very gently pressing on it while lying down. Do not try to force the bulge back in if it will not push in easily. Watch your hernia for any changes in shape, size, or   color. Get help right away if you notice any changes. Take over-the-counter and prescription medicines only as told by your health care provider. Keep all follow-up visits. This is important. Contact a health care provider if: You have a fever or chills. You develop new symptoms. Your symptoms get worse. Get help right away if: You have pain in your groin that suddenly gets  worse. You have a bulge in your groin that: Suddenly gets bigger and does not get smaller. Becomes red or purple or painful to the touch. You are a man and you have a sudden pain in your scrotum, or the size of your scrotum suddenly changes. You cannot push the hernia back in place by very gently pressing on it when you are lying down. You have nausea or vomiting that does not go away. You have a fast heartbeat. You cannot have a bowel movement or pass gas. These symptoms may represent a serious problem that is an emergency. Do not wait to see if the symptoms will go away. Get medical help right away. Call your local emergency services (911 in the [place] Summary An inguinal hernia develops when fat or the intestines push through a weak spot in a muscle where your leg meets your lower abdomen (groin). This condition is caused by having a weak spot in muscles or tissues in your groin. Symptoms may depend on the size of the hernia, and they may include pain or swelling in your groin. A small inguinal hernia often has no symptoms. Treatment may not be needed if you do not have symptoms. If you have symptoms or a large hernia, you may need surgery to repair the hernia. Avoid lifting heavy objects. Also, avoid standing for long periods of time. This information is not intended to replace advice given to you by your health care provider. Make sure you discuss any questions you have with your health care provider. Document Revised: 04/11/2020 Document Reviewed: 04/11/2020 Elsevier Patient Education  [place]. Ovarian Cysts You will learn about ovarian cysts, how they are diagnosed, their symptoms, and how they can be treated. To view the content, go to this web address: https://[email or web address].[email or web address]  This video will expire on: 08/07/2024. If you need access to this video following this date, please reach out to the healthcare provider who assigned it to you. This information is not  intended to replace advice given to you by your health care provider. Make sure you discuss any questions you have with your health care provider. Elsevier Patient Education  [place]. Abdominal Pain, Adult Pain in the abdomen (abdominal pain) can be caused by many things. Often, abdominal pain is not serious and it gets better with no treatment or by being treated at home. However, sometimes abdominal pain is serious. Your health care provider will ask questions about your medical history and do a physical exam to try to determine the cause of your abdominal pain. Follow these instructions at home: Medicines Take over-the-counter and prescription medicines only as told by your health care provider. Do not take a laxative unless told by your health care provider. General instructions  Watch your condition for any changes. Drink enough fluid to keep your urine pale yellow. Keep all follow-up visits as told by your health care provider. This is important. Contact a health care provider if: Your abdominal pain changes or gets worse. You are not hungry or you lose weight without trying. You are constipated or have diarrhea for more than 2-3 days.  You have pain when you urinate or have a bowel movement. Your abdominal pain wakes you up at night. Your pain gets worse with meals, after eating, or with certain foods. You are vomiting and cannot keep anything down. You have a fever. You have blood in your urine. Get help right away if: Your pain does not go away as soon as your health care provider told you to expect. You cannot stop vomiting. Your pain is only in areas of the abdomen, such as the right side or the left lower portion of the abdomen. Pain on the right side could be caused by appendicitis. You have bloody or black stools, or stools that look like tar. You have severe pain, cramping, or bloating in your abdomen. You have signs of dehydration, such as: Dark urine, very  little urine, or no urine. Cracked lips. Dry mouth. Sunken eyes. Sleepiness. Weakness. You have trouble breathing or chest pain. Summary Often, abdominal pain is not serious and it gets better with no treatment or by being treated at home. However, sometimes abdominal pain is serious. Watch your condition for any changes. Take over-the-counter and prescription medicines only as told by your health care provider. Contact a health care provider if your abdominal pain changes or gets worse. Get help right away if you have severe pain, cramping, or bloating in your abdomen. This information is not intended to replace advice given to you by your health care provider. Make sure you discuss any questions you have with your health care provider. Document Revised: 10/01/2019 Document Reviewed: 12/21/2018 Elsevier Patient Education  [place].

## 2022-11-14 NOTE — Progress Notes (Signed)
Subjective:    Patient ID: Rebecca Sweeney, female    DOB: December 12, 1960, 63 y.o.   MRN: [id number]  61y/o caucasian female established patient here for evaluation right lower abdomen pain.  Stated urinating without difficulty, tolerating po intake drinks 8 bottles water per day.  Denied n/v/d/f/c/hematuria/hematochezia.  Has been lifting some heavy boxes today at work.  Denied any bulges in abdomen/groin.  Denied history ovarian cysts/vaginal discharge/bleeding.  Hurts when I push the one area and same area with walking/moving my body hurts.  Has not tried any pain medication/ice/rest.  New onset today at work about 2 hours after her shift started.  Has not had pain in this location before.  Had BM earlier today      Review of Systems  Constitutional:  Negative for activity change, chills, diaphoresis, fatigue and fever.  HENT:  Negative for trouble swallowing and voice change.   Eyes:  Negative for photophobia and visual disturbance.  Respiratory:  Negative for cough.   Cardiovascular:  Negative for leg swelling.  Gastrointestinal:  Positive for abdominal pain. Negative for abdominal distention, anal bleeding, blood in stool, constipation, diarrhea, nausea and vomiting.  Genitourinary:  Negative for decreased urine volume, difficulty urinating, dysuria, flank pain, frequency, genital sores, hematuria, menstrual problem, pelvic pain, urgency, vaginal bleeding, vaginal discharge and vaginal pain.  Musculoskeletal:  Negative for arthralgias, back pain, gait problem, joint swelling and myalgias.  Neurological:  Negative for dizziness, tremors, seizures, syncope, facial asymmetry, speech difficulty, weakness, light-headedness, numbness and headaches.  Psychiatric/Behavioral:  Negative for agitation, confusion, self-injury and sleep disturbance.        Objective:   Physical Exam Vitals and nursing note reviewed.  Constitutional:      General: She is awake. She is not in acute distress.     Appearance: Normal appearance. She is well-developed, well-groomed and normal weight. She is not ill-appearing, toxic-appearing or diaphoretic.  HENT:     Head: Normocephalic and atraumatic.     Jaw: There is normal jaw occlusion.     Salivary Glands: Right salivary gland is not diffusely enlarged. Left salivary gland is not diffusely enlarged.     Right Ear: Hearing and external ear normal.     Left Ear: Hearing and external ear normal.     Nose: Nose normal. No congestion or rhinorrhea.     Mouth/Throat:     Lips: Pink. No lesions.     Mouth: Mucous membranes are moist. No oral lesions or angioedema.     Dentition: No gum lesions.     Tongue: No lesions.     Pharynx: Oropharynx is clear. Uvula midline. No uvula swelling.     Tonsils: No tonsillar exudate.  Eyes:     General: Lids are normal. Vision grossly intact. Gaze aligned appropriately. Allergic shiner present. No scleral icterus.       Right eye: No discharge.        Left eye: No discharge.     Extraocular Movements: Extraocular movements intact.     Conjunctiva/sclera: Conjunctivae normal.     Pupils: Pupils are equal, round, and reactive to light.  Neck:     Trachea: Trachea and phonation normal. No tracheal deviation.  Cardiovascular:     Rate and Rhythm: Normal rate and regular rhythm.     Pulses: Normal pulses.          Radial pulses are 2+ on the right side and 2+ on the left side.  Pulmonary:     Effort: Pulmonary effort  is normal. No respiratory distress.     Breath sounds: Normal breath sounds and air entry. No stridor or transmitted upper airway sounds. No wheezing.     Comments: Spoke full sentences without difficulty; no cough observed in exam room Abdominal:     General: Abdomen is flat. Bowel sounds are normal. There is no distension.     Palpations: Abdomen is soft. There is no mass.     Tenderness: There is abdominal tenderness in the right lower quadrant. There is no right CVA tenderness, left CVA  tenderness, guarding or rebound. Negative signs include Murphy's sign.     Hernia: No hernia is present.       Comments: No bulge with cough/sit up/no muscle diastasis palpated; dull to percussion x 4 quads; normoactive x 4 quads BS; abdomen soft; TTP over marked area only not equisitely  Musculoskeletal:        General: Normal range of motion.     Right hand: Normal strength. Normal capillary refill.     Left hand: Normal strength. Normal capillary refill.     Cervical back: Normal range of motion and neck supple. No swelling, edema, deformity, erythema, signs of trauma, lacerations, rigidity, spasms, torticollis, tenderness or crepitus. No pain with movement. Normal range of motion.     Thoracic back: No swelling, edema, deformity, signs of trauma, lacerations, spasms or tenderness. Normal range of motion.     Lumbar back: No swelling, edema, deformity, signs of trauma, lacerations, spasms or tenderness. Normal range of motion.     Right hip: No deformity, lacerations, tenderness, bony tenderness or crepitus. Normal range of motion. Normal strength.     Left hip: No deformity, lacerations, tenderness, bony tenderness or crepitus. Normal range of motion. Normal strength.     Right lower leg: No edema.     Left lower leg: No edema.  Lymphadenopathy:     Head:     Right side of head: No submandibular or preauricular adenopathy.     Left side of head: No submandibular or preauricular adenopathy.     Cervical: No cervical adenopathy.     Right cervical: No superficial cervical adenopathy.    Left cervical: No superficial cervical adenopathy.  Skin:    General: Skin is warm and dry.     Capillary Refill: Capillary refill takes less than 2 seconds.     Coloration: Skin is not ashen, cyanotic, jaundiced, mottled, pale or sallow.     Findings: No abrasion, bruising, burn, erythema, signs of injury, laceration, lesion, petechiae, rash or wound.  Neurological:     General: No focal deficit  present.     Mental Status: She is alert and oriented to person, place, and time. Mental status is at baseline.     Cranial Nerves: No cranial nerve deficit.     Motor: Motor function is intact. No weakness, tremor, abnormal muscle tone or seizure activity.     Coordination: Coordination is intact. Coordination normal.     Gait: Gait is intact. Gait normal.     Comments: In/out of chair without difficulty; gait sure and steady in clinic; bilateral hand grasp equal 5/5  Psychiatric:        Attention and Perception: Attention and perception normal.        Mood and Affect: Mood and affect normal.        Speech: Speech normal.        Behavior: Behavior normal. Behavior is cooperative.        Thought  Content: Thought content normal.        Cognition and Memory: Cognition and memory normal.        Judgment: Judgment normal.           Assessment & Plan:   A-abdomen pain right lower acute  P-Negative murphys sign and no rebound.  On/off exam table without difficulty/without assist.  muscle strain DDx inguinal hernia, ovarian cyst  Pain can be localized.  Discussed consider imaging.  I cannot give work restrictions per contract limitations.  Urinalysis dipstick normal and urine sent to [facility] for culture.  Patient notified urine dipstick essentially normal.  Continue drinking water to keep urine pale yellow clear and voiding every 2-4 hours while awake.  If worsening pain/hematuria/hematochezia patient to see provider for same day re-evaluation.  [facility] handouts on hernia inguinal, ovarian cyst and abdomen pain printed and given to patient  Ibuprofen 800mg  po tID prn pain #30 RF0 dispensed from [facility] and given to patient.  Her preference over tylenol helpful in the past when other body pain.  Take with food.  Discussed with patient to notify supervisor pain after lifting boxes today and to complete possible injury form. Environmental consultant and HR notified possible injury today at work treating in house  at this time.   Return to clinic today if worsening pain and notify staff will send to [facility] for further evaluation.  Discussed symptoms of hernia bulge, worsening pain, blood in stool black or red, n/v/d.  Ovarian cyst can be vaginal bleeding, single sided abdomen pain.  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  Consider heat/ice to affected area.  Patient agreed with plan of care and had no further questions at this time.

## 2022-11-16 LAB — URINE CULTURE

## 2023-01-09 ENCOUNTER — Ambulatory Visit: Payer: Self-pay | Admitting: Registered Nurse

## 2023-01-09 VITALS — BP 105/67 | HR 75 | Temp 98.5°F | Resp 18

## 2023-01-09 DIAGNOSIS — E7800 Pure hypercholesterolemia, unspecified: Secondary | ICD-10-CM

## 2023-01-09 DIAGNOSIS — R12 Heartburn: Secondary | ICD-10-CM

## 2023-01-09 DIAGNOSIS — F17210 Nicotine dependence, cigarettes, uncomplicated: Secondary | ICD-10-CM

## 2023-01-09 DIAGNOSIS — J019 Acute sinusitis, unspecified: Secondary | ICD-10-CM

## 2023-01-09 DIAGNOSIS — G8929 Other chronic pain: Secondary | ICD-10-CM

## 2023-01-09 MED ORDER — PREDNISONE 10 MG PO TABS: 20.0000 mg | ORAL_TABLET | Freq: Every day | ORAL | 0 refills | Status: AC

## 2023-01-09 MED ORDER — MELOXICAM 15 MG PO TABS: 15.0000 mg | ORAL_TABLET | Freq: Every day | ORAL | 1 refills | Status: DC | PRN

## 2023-01-09 MED ORDER — ATORVASTATIN CALCIUM 20 MG PO TABS: 20.0000 mg | ORAL_TABLET | Freq: Every day | ORAL | 0 refills | Status: DC

## 2023-01-09 MED ORDER — SALINE SPRAY 0.65 % NA SOLN: 2.0000 | NASAL | 0 refills | Status: DC

## 2023-01-09 MED ORDER — NICOTINE POLACRILEX 2 MG MT GUM: 2.0000 mg | CHEWING_GUM | OROMUCOSAL | 1 refills | Status: AC | PRN

## 2023-01-09 MED ORDER — OMEPRAZOLE 20 MG PO CPDR: 20.0000 mg | DELAYED_RELEASE_CAPSULE | Freq: Every day | ORAL | 0 refills | Status: DC

## 2023-01-09 MED ORDER — MONTELUKAST SODIUM 10 MG PO TABS: ORAL_TABLET | ORAL | 3 refills | Status: AC

## 2023-01-09 MED ORDER — AMOXICILLIN-POT CLAVULANATE 875-125 MG PO TABS: 1.0000 | ORAL_TABLET | Freq: Two times a day (BID) | ORAL | 0 refills | Status: AC

## 2023-01-09 NOTE — Patient Instructions (Addendum)
Quitting Smoking You will learn how quitting smoking can help prevent a variety of health problems and improve your health. To view the content, go to this web address: https://pe.elsevier.Healthy Living: Tips to Quit Tobacco There are many benefits of not using tobacco. You will learn tips you can use to stop using tobacco. To view the content, go to this web address: https://pe.elsevier.com/[email or web address]  This video will expire on: 08/07/2024. If you need access to this video following this date, please reach out to the healthcare provider who assigned it to you. This information is not intended to replace advice given to you by your health care provider. Make sure you discuss any questions you have with your health care provider. Elsevier Patient Education  2023 [employer] [email or web address]  This video will expire on: 08/07/2024. If you need access to this video following this date, please reach out to the healthcare provider who assigned it to you. This information is not intended to replace advice given to you by your health care provider. Make sure you discuss any questions you have with your health care provider. Else[employer] Patient Education  2023 [employer] Acute Bronchitis, Adult  Acute bronchitis is sudden inflammation of the main airways (bronchi) that come off the windpipe (trachea) in the lungs. The swelling causes the airways to get smaller and make more mucus than normal. This can make it hard to breathe and can cause coughing or noisy breathing (wheezing). Acute bronchitis may last several weeks. The cough may last longer. Allergies, asthma, and exposure to smoke may make the condition worse. What are the causes? This condition can be caused by germs and by substances that irritate the lungs, including: Cold and flu viruses. The most common cause of this condition is the virus that causes the common cold. Bacteria. This is less common. Breathing in substances that irritate the  lungs, including: Smoke from cigarettes and other forms of tobacco. Dust and pollen. Fumes from household cleaning products, gases, or burned fuel. Indoor or outdoor air pollution. What increases the risk? The following factors may make you more likely to develop this condition: A weak body's defense system, also called the immune system. A condition that affects your lungs and breathing, such as asthma. What are the signs or symptoms? Common symptoms of this condition include: Coughing. This may bring up clear, yellow, or green mucus from your lungs (sputum). Wheezing. Runny or stuffy nose. Having too much mucus in your lungs (chest congestion). Shortness of breath. Aches and pains, including sore throat or chest. How is this diagnosed? This condition is usually diagnosed based on: Your symptoms and medical history. A physical exam. You may also have other tests, including tests to rule out other conditions, such as pneumonia. These tests include: A test of lung function. Test of a mucus sample to look for the presence of bacteria. Tests to check the oxygen level in your blood. Blood tests. Chest X-ray. How is this treated? Most cases of acute bronchitis clear up over time without treatment. Your health care provider may recommend: Drinking more fluids to help thin your mucus so it is easier to cough up. Taking inhaled medicine (inhaler) to improve air flow in and out of your lungs. Using a vaporizer or a humidifier. These are machines that add water to the air to help you breathe better. Taking a medicine that thins mucus and clears congestion (expectorant). Taking a medicine that prevents or stops coughing (cough suppressant). It is not common to take an antibiotic  medicine for this condition. Follow these instructions at home:  Take over-the-counter and prescription medicines only as told by your health care provider. Use an inhaler, vaporizer, or humidifier as told by your  health care provider. Take two teaspoons (10 mL) of honey at bedtime to lessen coughing at night. Drink enough fluid to keep your urine pale yellow. Do not use any products that contain nicotine or tobacco. These products include cigarettes, chewing tobacco, and vaping devices, such as e-cigarettes. If you need help quitting, ask your health care provider. Get plenty of rest. Return to your normal activities as told by your health care provider. Ask your health care provider what activities are safe for you. Keep all follow-up visits. This is important. How is this prevented? To lower your risk of getting this condition again: Wash your hands often with soap and water for at least 20 seconds. If soap and water are not available, use hand sanitizer. Avoid contact with people who have cold symptoms. Try not to touch your mouth, nose, or eyes with your hands. Avoid breathing in smoke or chemical fumes. Breathing smoke or chemical fumes will make your condition worse. Get the flu shot every year. Contact a health care provider if: Your symptoms do not improve after 2 weeks. You have trouble coughing up the mucus. Your cough keeps you awake at night. You have a fever. Get help right away if you: Cough up blood. Feel pain in your chest. Have severe shortness of breath. Faint or keep feeling like you are going to faint. Have a severe headache. Have a fever or chills that get worse. These symptoms may represent a serious problem that is an emergency. Do not wait to see if the symptoms will go away. Get medical help right away. Call your local emergency services (911 in the [place] Do not drive yourself to the hospital. Summary Acute bronchitis is inflammation of the main airways (bronchi) that come off the windpipe (trachea) in the lungs. The swelling causes the airways to get smaller and make more mucus than normal. Drinking more fluids can help thin your mucus so it is easier to cough up. Take  over-the-counter and prescription medicines only as told by your health care provider. Do not use any products that contain nicotine or tobacco. These products include cigarettes, chewing tobacco, and vaping devices, such as e-cigarettes. If you need help quitting, ask your health care provider. Contact a health care provider if your symptoms do not improve after 2 weeks. This information is not intended to replace advice given to you by your health care provider. Make sure you discuss any questions you have with your health care provider. Document Revised: 11/22/2021 Document Reviewed: 12/13/2020 Elsevier Patient Education  2023 [employer] How to Perform a Sinus Rinse A sinus rinse is a home treatment that is used to rinse your sinuses with a germ-free (sterile) mixture of salt and water (saline solution). Sinuses are air-filled spaces in your skull that are behind the bones of your face and forehead. They open into your nasal cavity. A sinus rinse can help to clear mucus, dirt, dust, or pollen from your nasal cavity. You may do a sinus rinse when you have a cold, a virus, nasal allergy symptoms, a sinus infection, or stuffiness in your nose or sinuses. What are the risks? A sinus rinse is generally safe and effective. However, there are a few risks, which include: A burning sensation in your sinuses. This may happen if you do not  make the saline solution as directed. Be sure to follow all directions when making the saline solution. Nasal irritation. Infection. This may be from unclean supplies or from contaminated water. Infection from contaminated water is rare, but possible. Do not do a sinus rinse if you have had ear or nasal surgery, ear infection, or plugged ears, unless recommended by your health care provider. Supplies needed: Saline solution or powder. Distilled or sterile water to mix with saline powder. You may use boiled and cooled tap water. Boil tap water for 5 minutes; cool  until it is lukewarm. Use within 24 hours. Do not use regular tap water to mix with the saline solution. Neti pot or nasal rinse bottle. These supplies release the saline solution into your nose and through your sinuses. Neti pots and nasal rinse bottles can be purchased at [employer], a health food store, or online. How to perform a sinus rinse  Wash your hands with soap and water for at least 20 seconds. If soap and water are not available, use hand sanitizer. Wash your device according to the directions that came with the product and then dry it. Use the solution that comes with your product or one that is sold separately in stores. Follow the mixing directions on the package to mix with sterile or distilled water. Fill the device with the amount of saline solution noted in the device instructions. Stand by a sink and tilt your head sideways over the sink. Place the spout of the device in your upper nostril (the one closer to the ceiling). Gently pour or squeeze the saline solution into your nasal cavity. The liquid should drain out from the lower nostril if you are not too congested. While rinsing, breathe through your open mouth. Gently blow your nose to clear any mucus and rinse solution. Blowing too hard may cause ear pain. Turn your head in the other direction and repeat in your other nostril. Clean and rinse your device with clean water and then air-dry it. Talk with your health care provider or pharmacist if you have questions about how to do a sinus rinse. Summary A sinus rinse is a home treatment that is used to rinse your sinuses with a sterile mixture of salt and water (saline solution). You may do a sinus rinse when you have a cold, a virus, nasal allergy symptoms, a sinus infection, or stuffiness in your nose or sinuses. A sinus rinse is generally safe and effective. Follow all instructions carefully. This information is not intended to replace advice given to you by  your health care provider. Make sure you discuss any questions you have with your health care provider. Document Revised: 01/29/2021 Document Reviewed: 01/29/2021 Elsevier Patient Education  2023 [employer] Sinus Infection, Adult A sinus infection, also called sinusitis, is inflammation of your sinuses. Sinuses are hollow spaces in the bones around your face. Your sinuses are located: Around your eyes. In the middle of your forehead. Behind your nose. In your cheekbones. Mucus normally drains out of your sinuses. When your nasal tissues become inflamed or swollen, mucus can become trapped or blocked. This allows bacteria, viruses, and fungi to grow, which leads to infection. Most infections of the sinuses are caused by a virus. A sinus infection can develop quickly. It can last for up to 4 weeks (acute) or for more than 12 weeks (chronic). A sinus infection often develops after a cold. What are the causes? This condition is caused by anything that creates swelling  in the sinuses or stops mucus from draining. This includes: Allergies. Asthma. Infection from bacteria or viruses. Deformities or blockages in your nose or sinuses. Abnormal growths in the nose (nasal polyps). Pollutants, such as chemicals or irritants in the air. Infection from fungi. This is rare. What increases the risk? You are more likely to develop this condition if you: Have a weak body defense system (immune system). Do a lot of swimming or diving. Overuse nasal sprays. Smoke. What are the signs or symptoms? The main symptoms of this condition are pain and a feeling of pressure around the affected sinuses. Other symptoms include: Stuffy nose or congestion that makes it difficult to breathe through your nose. Thick yellow or greenish drainage from your nose. Tenderness, swelling, and warmth over the affected sinuses. A cough that may get worse at night. Decreased sense of smell and taste. Extra mucus that  collects in the throat or the back of the nose (postnasal drip) causing a sore throat or bad breath. Tiredness (fatigue). Fever. How is this diagnosed? This condition is diagnosed based on: Your symptoms. Your medical history. A physical exam. Tests to find out if your condition is acute or chronic. This may include: Checking your nose for nasal polyps. Viewing your sinuses using a device that has a light (endoscope). Testing for allergies or bacteria. Imaging tests, such as an MRI or CT scan. In rare cases, a bone biopsy may be done to rule out more serious types of fungal sinus disease. How is this treated? Treatment for a sinus infection depends on the cause and whether your condition is chronic or acute. If caused by a virus, your symptoms should go away on their own within 10 days. You may be given medicines to relieve symptoms. They include: Medicines that shrink swollen nasal passages (decongestants). A spray that eases inflammation of the nostrils (topical intranasal corticosteroids). Rinses that help get rid of thick mucus in your nose (nasal saline washes). Medicines that treat allergies (antihistamines). Over-the-counter pain relievers. If caused by bacteria, your health care provider may recommend waiting to see if your symptoms improve. Most bacterial infections will get better without antibiotic medicine. You may be given antibiotics if you have: A severe infection. A weak immune system. If caused by narrow nasal passages or nasal polyps, surgery may be needed. Follow these instructions at home: Medicines Take, use, or apply over-the-counter and prescription medicines only as told by your health care provider. These may include nasal sprays. If you were prescribed an antibiotic medicine, take it as told by your health care provider. Do not stop taking the antibiotic even if you start to feel better. Hydrate and humidify  Drink enough fluid to keep your urine pale yellow.  Staying hydrated will help to thin your mucus. Use a cool mist humidifier to keep the humidity level in your home above 50%. Inhale steam for 10-15 minutes, 3-4 times a day, or as told by your health care provider. You can do this in the bathroom while a hot shower is running. Limit your exposure to cool or dry air. Rest Rest as much as possible. Sleep with your head raised (elevated). Make sure you get enough sleep each night. General instructions  Apply a warm, moist washcloth to your face 3-4 times a day or as told by your health care provider. This will help with discomfort. Use nasal saline washes as often as told by your health care provider. Wash your hands often with soap and water to reduce  your exposure to germs. If soap and water are not available, use hand sanitizer. Do not smoke. Avoid being around people who are smoking (secondhand smoke). Keep all follow-up visits. This is important. Contact a health care provider if: You have a fever. Your symptoms get worse. Your symptoms do not improve within 10 days. Get help right away if: You have a severe headache. You have persistent vomiting. You have severe pain or swelling around your face or eyes. You have vision problems. You develop confusion. Your neck is stiff. You have trouble breathing. These symptoms may be an emergency. Get help right away. Call 911. Do not wait to see if the symptoms will go away. Do not drive yourself to the hospital. Summary A sinus infection is soreness and inflammation of your sinuses. Sinuses are hollow spaces in the bones around your face. This condition is caused by nasal tissues that become inflamed or swollen. The swelling traps or blocks the flow of mucus. This allows bacteria, viruses, and fungi to grow, which leads to infection. If you were prescribed an antibiotic medicine, take it as told by your health care provider. Do not stop taking the antibiotic even if you start to feel  better. Keep all follow-up visits. This is important. This information is not intended to replace advice given to you by your health care provider. Make sure you discuss any questions you have with your health care provider. Document Revised: 07/17/2021 Document Reviewed: 07/17/2021 Elsevier Patient Education  2023 [employer].

## 2023-01-09 NOTE — Progress Notes (Signed)
Covid test negative

## 2023-01-09 NOTE — Progress Notes (Addendum)
Subjective:    Patient ID: Rebecca Sweeney, female    DOB: 08-Jul-1961, 62 y.o.   MRN: [id number]  62y/o married caucasian female established patient here for evaluation sinus pain/pressure, headache, fatigue from not sleeping well and cough.  Has not performed home covid test.  Feels like her usual annual sinus infection in the head but she is concerned lungs are infected also but not much coming out.  Smoking 6 cigarettes a day typically but hasn't been smoking as much due to cough worsens.  She thinks she needs prednisone last used over a year ago.  Denied vomiting after coughing, fever, chills, n/v/d, loss of taste or smell.  Headache is back of head and forehead/around eyes (not the worst headache ever).  Has been using flonase nasal spray and has noticed nosebleeds, post nasal drip mucous brown/yellow and cloudy when she is able to expectorate.  Has not been using nasal saline.  Started mucinex yesterday helping.  Was supposed to see PCM today but cancelled appt.  Needs refill of mobic and trazodone.   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 arm/leg weakness, incontinence or saddle paresthesias.  Ran out of medication 2 weeks ago.  Still taking claritin, atorvastatin and singulair.  Post nasal drip and cough waking her up at night.      Review of Systems  Constitutional:  Positive for fatigue. Negative for activity change, appetite change, chills, diaphoresis, fever and unexpected weight change.  HENT:  Positive for congestion, nosebleeds, postnasal drip, rhinorrhea, sinus pressure and sinus pain. Negative for dental problem, drooling, ear discharge, ear pain, facial swelling, hearing loss, mouth sores, sneezing, sore throat, tinnitus, trouble swallowing and voice change.   Eyes:  Negative for photophobia, pain, discharge, redness, itching and visual disturbance.  Respiratory:  Positive for cough. Negative for choking, chest  tightness, shortness of breath, wheezing and stridor.   Cardiovascular:  Negative for chest pain, palpitations and leg swelling.  Gastrointestinal:  Negative for abdominal distention, abdominal pain, blood in stool, constipation, diarrhea, nausea and vomiting.  Endocrine: Negative for cold intolerance and heat intolerance.  Genitourinary:  Negative for difficulty urinating.  Musculoskeletal:  Positive for back pain and myalgias. Negative for arthralgias, gait problem, joint swelling, neck pain and neck stiffness.  Skin:  Negative for color change, pallor, rash and wound.  Allergic/Immunologic: Positive for environmental allergies. Negative for food allergies.  Neurological:  Positive for headaches. Negative for dizziness, tremors, seizures, syncope, facial asymmetry, speech difficulty, weakness, light-headedness and numbness.  Hematological:  Negative for adenopathy. Does not bruise/bleed easily.  Psychiatric/Behavioral:  Positive for sleep disturbance. Negative for agitation, behavioral problems and confusion.        Objective:   Physical Exam Vitals and nursing note reviewed.  Constitutional:      General: She is awake. She is not in acute distress.    Appearance: Normal appearance. She is well-developed, well-groomed and normal weight. She is ill-appearing. She is not toxic-appearing or diaphoretic.  HENT:     Head: Normocephalic and atraumatic.     Jaw: There is normal jaw occlusion. No trismus.     Salivary Glands: Right salivary gland is not diffusely enlarged or tender. Left salivary gland is not diffusely enlarged or tender.     Right Ear: Hearing, ear canal and external ear normal. No decreased hearing noted. No laceration, drainage, swelling or tenderness. A middle ear effusion is present. There is no impacted cerumen. No  foreign body. No mastoid tenderness. No PE tube. No hemotympanum. Tympanic membrane is not injected, scarred, perforated, erythematous, retracted or bulging.      Left Ear: Hearing, ear canal and external ear normal. No decreased hearing noted. No laceration, drainage, swelling or tenderness. A middle ear effusion is present. There is no impacted cerumen. No foreign body. No mastoid tenderness. No PE tube. No hemotympanum. Tympanic membrane is erythematous. Tympanic membrane is not injected, scarred, perforated, retracted or bulging.     Ears:     Comments: Left TM intact erythema distal edges air fluid level 50% opacity; right TM air fluid level clear intact; no debris bilateral auditory canals    Nose: Mucosal edema, congestion and rhinorrhea present. No nasal deformity, septal deviation or laceration. Rhinorrhea is clear.     Right Turbinates: Enlarged and swollen. Not pale.     Left Turbinates: Enlarged and swollen. Not pale.     Right Sinus: Maxillary sinus tenderness and frontal sinus tenderness present.     Left Sinus: Maxillary sinus tenderness and frontal sinus tenderness present.     Comments: Nasal turbinates edema/erythema clear discharge nares; bilateral allergic shiners; lower eyelids edema 1+/4 bilaterally; bilateral frontal and maxillary sinus tenderness equal; cobblestoning posterior pharynx;     Mouth/Throat:     Lips: Pink. No lesions.     Mouth: Mucous membranes are moist. Mucous membranes are not pale, not dry and not cyanotic. No lacerations, oral lesions or angioedema.     Dentition: Abnormal dentition. Has dentures. No dental abscesses or gum lesions.     Tongue: No lesions. Tongue does not deviate from midline.     Palate: No mass and lesions.     Pharynx: Uvula midline. Pharyngeal swelling and posterior oropharyngeal erythema present. No oropharyngeal exudate or uvula swelling.     Tonsils: No tonsillar exudate or tonsillar abscesses. 0 on the right. 0 on the left.  Eyes:     General: Lids are normal. Vision grossly intact. Gaze aligned appropriately. Allergic shiner present. No scleral icterus.       Right eye: No foreign body,  discharge or hordeolum.        Left eye: No foreign body, discharge or hordeolum.     Extraocular Movements: Extraocular movements intact.     Right eye: Normal extraocular motion and no nystagmus.     Left eye: Normal extraocular motion and no nystagmus.     Conjunctiva/sclera: Conjunctivae normal.     Right eye: Right conjunctiva is not injected. No chemosis, exudate or hemorrhage.    Left eye: Left conjunctiva is not injected. No chemosis, exudate or hemorrhage.    Pupils: Pupils are equal, round, and reactive to light. Pupils are equal.     Right eye: Pupil is round and reactive.     Left eye: Pupil is round and reactive.  Neck:     Thyroid: No thyroid mass or thyromegaly.     Trachea: Trachea and phonation normal. No tracheal tenderness or tracheal deviation.  Cardiovascular:     Rate and Rhythm: Normal rate and regular rhythm.     Pulses: Normal pulses.          Radial pulses are 2+ on the right side and 2+ on the left side.     Heart sounds: Normal heart sounds, S1 normal and S2 normal.  Pulmonary:     Effort: Pulmonary effort is normal. No accessory muscle usage or respiratory distress.     Breath sounds: Decreased air movement present.  No stridor. Examination of the right-lower field reveals decreased breath sounds. Examination of the left-lower field reveals decreased breath sounds. Decreased breath sounds present. No wheezing, rhonchi or rales.     Comments: Spoke full sentences without difficulty; nonproductive cough with deep breathing observed in exam room; sp02 stable 93% RA while speaking/at rest Chest:     Chest wall: No tenderness.  Abdominal:     General: There is no distension.     Palpations: Abdomen is soft.  Musculoskeletal:        General: No tenderness.     Right hand: Normal strength. Normal capillary refill.     Left hand: Normal strength. Normal capillary refill.     Cervical back: Normal range of motion and neck supple. No swelling, edema, deformity,  erythema, signs of trauma, lacerations, rigidity, spasms, torticollis, tenderness or crepitus. No pain with movement or muscular tenderness. Normal range of motion.     Thoracic back: No swelling, edema, deformity, signs of trauma, lacerations, spasms or tenderness. Normal range of motion. No scoliosis.     Lumbar back: No swelling, edema, deformity, signs of trauma, lacerations, spasms or tenderness. Decreased range of motion. No scoliosis.     Right hip: Normal.     Left hip: Normal.     Right knee: Normal.     Left knee: Normal.     Right lower leg: No edema.     Left lower leg: No edema.  Lymphadenopathy:     Head:     Right side of head: No submental, submandibular, tonsillar, preauricular, posterior auricular or occipital adenopathy.     Left side of head: No submental, submandibular, tonsillar, preauricular, posterior auricular or occipital adenopathy.     Cervical: No cervical adenopathy.     Right cervical: No superficial, deep or posterior cervical adenopathy.    Left cervical: No superficial, deep or posterior cervical adenopathy.  Skin:    General: Skin is warm and dry.     Capillary Refill: Capillary refill takes less than 2 seconds.     Coloration: Skin is not ashen, cyanotic, jaundiced, mottled, pale or sallow.     Findings: No abrasion, abscess, acne, bruising, burn, ecchymosis, erythema, signs of injury, laceration, lesion, petechiae, rash or wound.     Nails: There is no clubbing.  Neurological:     General: No focal deficit present.     Mental Status: She is alert and oriented to person, place, and time. Mental status is at baseline. She is not disoriented.     GCS: GCS eye subscore is 4. GCS verbal subscore is 5. GCS motor subscore is 6.     Cranial Nerves: Cranial nerves 2-12 are intact. No cranial nerve deficit, dysarthria or facial asymmetry.     Sensory: No sensory deficit.     Motor: Motor function is intact. No weakness, tremor, atrophy, abnormal muscle tone or  seizure activity.     Coordination: Coordination is intact. Coordination normal.     Gait: Gait is intact. Gait normal.     Deep Tendon Reflexes:     Reflex Scores:      Bicep reflexes are 2+ on the right side and 2+ on the left side.      Patellar reflexes are 2+ on the right side and 2+ on the left side.    Comments: On/off exam table and in/out of chair without difficulty; gait sure and steady in clinic; bilateral hand grasp equal 5/5  Psychiatric:  Attention and Perception: Attention and perception normal.        Mood and Affect: Mood and affect normal.        Speech: Speech normal.        Behavior: Behavior normal. Behavior is cooperative.        Thought Content: Thought content normal.        Cognition and Memory: Cognition and memory normal.        Judgment: Judgment normal.    2009 chest xray Clinical Data: Chest pain    CHEST - 2 VIEW    Comparison: None    Findings: Heart size and mediastinal contours are unremarkable.    There is no pleural effusion or pulmonary interstitial edema.    No airspace opacities are identified.    Interstitial changes suggestive of emphysema are noted.    IMPRESSION:    1. No active cardiopulmonary disease.  2.  Query mild emphysema.   Provider: Donnita Falls, Christoper Fabian    Latest Reference Range & Units 10/08/22 08:20  Sodium 134 - 144 mmol/L 139  Potassium 3.5 - 5.2 mmol/L 3.9  Chloride 96 - 106 mmol/L 100  Glucose 70 - 99 mg/dL 161 (H)  BUN 8 - 27 mg/dL 13  Creatinine 0.96 - 0.45 mg/dL 4.09  Calcium 8.7 - 81.1 mg/dL 9.2  BUN/Creatinine Ratio 12 - 28  14  eGFR >59 mL/min/1.73 69  Phosphorus 3.0 - 4.3 mg/dL 3.7  Magnesium 1.6 - 2.3 mg/dL 2.1  Alkaline Phosphatase 44 - 121 IU/L 73  Albumin 3.8 - 4.9 g/dL 4.5  Albumin/Globulin Ratio 1.2 - 2.2  1.8  Uric Acid 3.0 - 7.2 mg/dL 6.3  AST 0 - 40 IU/L 28  ALT 0 - 32 IU/L 30  Total Protein 6.0 - 8.5 g/dL 7.0  Total Bilirubin 0.0 - 1.2 mg/dL 0.6  GGT 0 - 60 IU/L 39   Estimated CHD Risk 0.0 - 1.0 times avg. 0.5  LDH 119 - 226 IU/L 171  Total CHOL/HDL Ratio 0.0 - 4.4 ratio 3.4  Cholesterol, Total 100 - 199 mg/dL 914  HDL Cholesterol >78 mg/dL 53  Triglycerides 0 - 295 mg/dL 621 (H)  VLDL Cholesterol Cal 5 - 40 mg/dL 41 (H)  LDL Chol Calc (NIH) 0 - 99 mg/dL 88  Iron 27 - 308 ug/dL 93  Vitamin D, 65-HQIONGE 30.0 - 100.0 ng/mL 27.3 (L)  Globulin, Total 1.5 - 4.5 g/dL 2.5  WBC 3.4 - 95.2 W41L2/GM 7.2  RBC 3.77 - 5.28 x10E6/uL 4.12  Hemoglobin 11.1 - 15.9 g/dL 01.0  HCT 27.2 - 53.6 % 40.6  MCV 79 - 97 fL 99 (H)  MCH 26.6 - 33.0 pg 32.5  MCHC 31.5 - 35.7 g/dL 64.4  RDW 03.4 - 74.2 % 12.2  Platelets 150 - 450 x10E3/uL 328  Neutrophils Not Estab. % 56  Immature Granulocytes Not Estab. % 0  NEUT# 1.4 - 7.0 x10E3/uL 4.0  Lymphocyte # 0.7 - 3.1 x10E3/uL 2.6  Monocytes Absolute 0.1 - 0.9 x10E3/uL 0.4  Basophils Absolute 0.0 - 0.2 x10E3/uL 0.0  Immature Grans (Abs) 0.0 - 0.1 x10E3/uL 0.0  Lymphs Not Estab. % 36  Monocytes Not Estab. % 6  Basos Not Estab. % 0  Eos Not Estab. % 2  EOS (ABSOLUTE) 0.0 - 0.4 x10E3/uL 0.1  Hemoglobin A1C 4.8 - 5.6 % 5.5  TSH 0.450 - 4.500 uIU/mL 2.920  Thyroxine (T4) 4.5 - 12.0 ug/dL 5.7  Free Thyroxine Index 1.2 - 4.9  1.6  T3  Uptake Ratio 24 - 39 % 28  (H): Data is abnormally high (L): Data is abnormally low     Assessment & Plan:  A-acute rhinosinusitis, chronic bilateral low back pain, cigarette nicotine dependence without complication, heartburn, hypercholesterolemia, acute bronchitis  P-patient given home covid test to complete prior to returning to work center today.  augmentin 875mg  po BID x 10 days #20 RF0 dispensed from [facility] to patient continue flonase 1 spray each nostril BID, saline 2 sprays each nostril q2h wa prn congestion given 1 bottle nasal saline from clinic stock.  Prednisone 10mg  sig t2 po BID x 4 days #21 RF0 dispensed from [facility] to patient discussed she will have left over tabs in bottle. If  worsening nosebleeds after 2 days prednisone/augmentin stop flonase nasal.  Denied personal or family history of ENT cancer.  Shower BID especially prior to bed. No evidence of systemic bacterial infection, non toxic and well hydrated.  I do not see where any further testing or imaging is necessary at this time.   I will suggest supportive care, rest, good hygiene and encourage the patient to take adequate fluids.  The patient is to return to clinic or EMERGENCY ROOM if symptoms worsen or change significantly.  Exitcare handouts on sinusitis and sinus rinse.  Patient verbalized agreement and understanding of treatment plan and had no further questions at this time.   P2:  Hand washing and cover cough   Discussed cutting down/stopping smoking when ill.  Sp02 decreased from her baseline.  Discussed airways inflamed from seasonal allergies and upper URI/sinusitis.  No wheezing/rhonchi/rales on auscultation or fluid in lungs audible.  Continue singulair 10mg  po qhs, claritin 10mg  po qam, flonase 1 spray each nostril BID and nasal saline 2 sprays prn.  May continue mucinex 600mg  po BID if tenacious mucous.  Patient stated she would like to try and quit smoking.  Discussed transdermal patches versus gum and she would like to try gum.  Rx sent to her pharmacy of choice for 2mg  nicorette gum may chew 1 piece per hour max 24 pieces per 24 hours #100 RF1 x 6 weeks use then will step down dosing.  Discussed with patient to stop cigarettes especially while sick.  Exitcare handout on tobacco cessation tips.  Discussed ALA quit line and patient has brain shark from [employer] regarding cessation tips also.  Patient to follow up 1 month or sooner if questions or concerns.  PCM has ordered chest CT for lung cancer screening.  Employer offers monetary incentives if quits for 90 days and insurance discount for quit attempts.  Patient verbalized understanding information/instructions and had no further questions at this time.  Cough  lozenges po q2h prn cough given 8 UD from clinic stock.  Prednisone taper 10mg  sig t2 po daily with breakfast #21 RF0 dispensed from [facility].  Discussed possible side effects increased/decreased appetite, difficulty sleeping, increased blood sugar, increased blood pressure and heart rate.  Bronchitis simple, community acquired, may have started as viral (probably respiratory syncytial, parainfluenza, influenza, or adenovirus), but now evidence of acute purulent bronchitis with resultant bronchial edema and mucus formation.  Viruses are the most common cause of bronchial inflammation in otherwise healthy adults with acute bronchitis.  The appearance of sputum is not predictive of whether a bacterial infection is present.  Purulent sputum is most often caused by viral infections.  There are a small portion of those caused by non-viral agents being Mycoplama pneumonia.  Microscopic examination or C&S of sputum in the healthy  adult with acute bronchitis is generally not helpful (usually negative or normal respiratory flora) other considerations being cough from upper respiratory tract infections, sinusitis or allergic syndromes (mild asthma or viral pneumonia).  Differential Diagnoses:  reactive airway disease (asthma, allergic aspergillosis (eosinophilia), chronic bronchitis, respiratory infection (sinusitis, common cold, pneumonia), congestive heart failure, reflux esophagitis, bronchogenic tumor, aspiration syndromes and/or exposure to pulmonary irritants/smoke. Without high fever, severe dyspnea, lack of physical findings or other risk factors, I will hold on a chest radiograph and CBC at this time.  I discussed that approximately 50% of patients with acute bronchitis have a cough that lasts up to three weeks, and 25% for over a month.  Tylenol 500mg  one to two tablets every four to six hours as needed for fever or myalgias.  No aspirin. Exitcare handout on bronchitis  ER if hemopthysis, SOB, worst chest pain of life.    Patient instructed to follow up if symptoms worsen.  Patient verbalized agreement and understanding of treatment plan.  P2:  hand washing and cover cough   Chronic low back pain mobic 15mg  po daily take with food #90 RF3 sent to her pharmacy of choice.  Renal and liver function stable.  Back pain not worsening or changing chronic denied red flags.  Discussed with patient due to contract limitations I cannot prescribe mental health medications, hormones, controlled substances, or weight loss medications.  She will need to follow up with her PCM for trazodone and wellbutrin refills.  Patient verbalized understanding information/instructions and had no further questions at this time.  Patient stated she still has omeprazole and atorvastatin at home to take.

## 2023-01-14 ENCOUNTER — Ambulatory Visit: Payer: Self-pay | Admitting: Registered Nurse

## 2023-01-14 ENCOUNTER — Encounter: Payer: Self-pay | Admitting: Registered Nurse

## 2023-01-14 VITALS — BP 110/85 | HR 71 | Temp 97.9°F | Resp 18

## 2023-01-14 DIAGNOSIS — F17210 Nicotine dependence, cigarettes, uncomplicated: Secondary | ICD-10-CM

## 2023-01-14 DIAGNOSIS — J019 Acute sinusitis, unspecified: Secondary | ICD-10-CM

## 2023-01-14 DIAGNOSIS — J209 Acute bronchitis, unspecified: Secondary | ICD-10-CM

## 2023-01-14 NOTE — Patient Instructions (Signed)
Cough, Adult Coughing is a reflex that clears your throat and airways (respiratory system). It helps heal and protect your lungs. It is normal to cough from time to time. A cough that happens with other symptoms or that lasts a long time may be a sign of a condition that needs treatment. A short-term (acute) cough may only last 2-3 weeks. A long-term (chronic) cough may last 8 or more weeks. Coughing is often caused by: Diseases, such as: An infection of the respiratory system. Asthma or other heart or lung diseases. Gastroesophageal reflux. This is when acid comes back up from the stomach. Breathing in things that irritate your lungs. Allergies. Postnasal drip. This is when mucus runs down the back of your throat. Smoking. Some medicines. Follow these instructions at home: Medicines Take over-the-counter and prescription medicines only as told by your health care provider. Talk with your provider before you take cough medicine (cough suppressants). Eating and drinking Do not drink alcohol. Avoid caffeine. Drink enough fluid to keep your pee (urine) pale yellow. Lifestyle Avoid cigarette smoke. Do not use any products that contain nicotine or tobacco. These products include cigarettes, chewing tobacco, and vaping devices, such as e-cigarettes. If you need help quitting, ask your provider. Avoid things that make you cough. These may include perfumes, candles, cleaning products, or campfire smoke. General instructions  Watch for any changes to your cough. Tell your provider about them. Always cover your mouth when you cough. If the air is dry in your bedroom or home, use a cool mist vaporizer or humidifier. If your cough is worse at night, try to sleep in a semi-upright position. Rest as needed. Contact a health care provider if: You have new symptoms, or your symptoms get worse. You cough up pus. You have a fever that does not go away or a cough that does not get better after 2-3  weeks. You cannot control your cough with medicine, and you are losing sleep. You have pain that gets worse or is not helped with medicine. You lose weight for no clear reason. You have night sweats. Get help right away if: You cough up blood. You have trouble breathing. Your heart is beating very fast. These symptoms may be an emergency. Get help right away. Call 911. Do not wait to see if the symptoms will go away. Do not drive yourself to the hospital. This information is not intended to replace advice given to you by your health care provider. Make sure you discuss any questions you have with your health care provider. Document Revised: 04/12/2022 Document Reviewed: 04/12/2022 [employer] Patient Education  2023 [employer]  

## 2023-01-14 NOTE — Progress Notes (Signed)
Subjective:    Patient ID: Rebecca Sweeney, female    DOB: July 28, 1961, 62 y.o.   MRN: [id number]  62y/o established married caucasian female here for re-evaluation sinus pain/pressure improving since starting augmentin and prednisone but sinus pressure hasn't completely resolved in cheeks.  Forehead and behind eyes yes.  Using nasal saline and still taking augmentin.  Stopped tessalon pearles this am because instructions stated 10 days only use.  Still having cough yellow/green/brown at times productive. Mucous thick makes her cough more Denied fever/chills/n/v/d/wheezing.  Inhaler use twice a day or less.  Still waiting on insurance approval for lung cancer scan PCM ordered from insurance.  Smoking 5 cigarettes per day plans to pick up nicotine gum after pay day.  Has been increasing water intake from 6 to 10 bottles per day this past week.      Review of Systems  Constitutional:  Negative for chills, diaphoresis and fever.  HENT:  Positive for sinus pressure. Negative for congestion, postnasal drip, sore throat, trouble swallowing and voice change.   Eyes:  Negative for photophobia and visual disturbance.  Respiratory:  Positive for cough. Negative for choking, chest tightness, shortness of breath, wheezing and stridor.   Cardiovascular:  Negative for chest pain.  Gastrointestinal:  Negative for diarrhea and vomiting.  Genitourinary:  Negative for difficulty urinating.  Musculoskeletal:  Negative for back pain, gait problem, neck pain and neck stiffness.  Skin:  Negative for color change and rash.  Allergic/Immunologic: Positive for environmental allergies.  Neurological:  Negative for dizziness, tremors, syncope, facial asymmetry, speech difficulty, weakness, light-headedness and headaches.  Psychiatric/Behavioral:  Negative for agitation, confusion and sleep disturbance.        Objective:   Physical Exam Vitals and nursing note reviewed.  Constitutional:      General: She is awake.  She is not in acute distress.    Appearance: Normal appearance. She is well-developed, well-groomed and normal weight. She is not ill-appearing, toxic-appearing or diaphoretic.  HENT:     Head: Normocephalic and atraumatic.     Jaw: There is normal jaw occlusion.     Salivary Glands: Right salivary gland is not diffusely enlarged or tender. Left salivary gland is not diffusely enlarged or tender.     Right Ear: Hearing, ear canal and external ear normal. No decreased hearing noted. No laceration, drainage, swelling or tenderness. A middle ear effusion is present. There is no impacted cerumen. No foreign body. No mastoid tenderness. No PE tube. No hemotympanum. Tympanic membrane is not injected, scarred, perforated, erythematous, retracted or bulging.     Left Ear: Hearing, ear canal and external ear normal. No decreased hearing noted. No laceration, drainage, swelling or tenderness. A middle ear effusion is present. There is no impacted cerumen. No foreign body. No mastoid tenderness. No PE tube. No hemotympanum. Tympanic membrane is erythematous. Tympanic membrane is not injected, scarred, perforated, retracted or bulging.     Ears:     Comments: Erythema left TM improved today; opacity 25% bilateral TMs intact air fluid level no debris in auditory canals    Nose: No congestion or rhinorrhea.     Right Turbinates: Enlarged and swollen. Not pale.     Left Turbinates: Enlarged and swollen. Not pale.     Right Sinus: Maxillary sinus tenderness present. No frontal sinus tenderness.     Left Sinus: Maxillary sinus tenderness present. No frontal sinus tenderness.     Comments: Yellow sputum productive cough in clinic today maxillary sinuses TTP bilateral mildly  today; bilateral allergic shiners; cobblestoning posterior pharynx improving    Mouth/Throat:     Lips: Pink. No lesions.     Mouth: Mucous membranes are moist.     Dentition: Abnormal dentition. No gum lesions.     Tongue: No lesions. Tongue  does not deviate from midline.     Palate: No mass and lesions.     Pharynx: Uvula midline. Pharyngeal swelling and posterior oropharyngeal erythema present.     Tonsils: No tonsillar exudate. 0 on the right. 0 on the left.  Eyes:     General: Lids are normal. Vision grossly intact. Gaze aligned appropriately. Allergic shiner present. No scleral icterus.       Right eye: No discharge.        Left eye: No discharge.     Extraocular Movements: Extraocular movements intact.     Conjunctiva/sclera: Conjunctivae normal.     Pupils: Pupils are equal, round, and reactive to light.  Neck:     Trachea: Trachea and phonation normal. No tracheal deviation.  Cardiovascular:     Rate and Rhythm: Normal rate and regular rhythm.     Pulses: Normal pulses.          Radial pulses are 2+ on the right side and 2+ on the left side.     Heart sounds: Normal heart sounds, S1 normal and S2 normal.  Pulmonary:     Effort: Pulmonary effort is normal. No respiratory distress.     Breath sounds: Normal breath sounds and air entry. No stridor or transmitted upper airway sounds. No wheezing, rhonchi or rales.     Comments: Spoke full sentences without difficulty; cough observed in exam room productive mucous tenacious/thick/opaque yellow spit in tissue Abdominal:     General: Abdomen is flat.  Musculoskeletal:        General: Normal range of motion.     Right hand: Normal strength. Normal capillary refill.     Left hand: Normal strength. Normal capillary refill.     Cervical back: Normal range of motion and neck supple. No edema, erythema, signs of trauma, rigidity, tenderness or crepitus. No pain with movement or muscular tenderness.     Right lower leg: No edema.     Left lower leg: No edema.  Lymphadenopathy:     Head:     Right side of head: No submental, submandibular, tonsillar, preauricular, posterior auricular or occipital adenopathy.     Left side of head: No submental, submandibular, tonsillar,  preauricular, posterior auricular or occipital adenopathy.     Cervical: No cervical adenopathy.     Right cervical: No superficial, deep or posterior cervical adenopathy.    Left cervical: No superficial, deep or posterior cervical adenopathy.  Skin:    General: Skin is warm and dry.     Capillary Refill: Capillary refill takes less than 2 seconds.     Coloration: Skin is not ashen, cyanotic, jaundiced, mottled, pale or sallow.     Findings: No abrasion, abscess, acne, bruising, burn, ecchymosis, erythema, signs of injury, laceration, lesion, petechiae, rash or wound.     Nails: There is no clubbing.  Neurological:     General: No focal deficit present.     Mental Status: She is alert and oriented to person, place, and time. Mental status is at baseline.     GCS: GCS eye subscore is 4. GCS verbal subscore is 5. GCS motor subscore is 6.     Cranial Nerves: Cranial nerves 2-12 are intact. No  cranial nerve deficit, dysarthria or facial asymmetry.     Sensory: Sensation is intact.     Motor: Motor function is intact. No weakness, tremor, atrophy, abnormal muscle tone or seizure activity.     Coordination: Coordination is intact. Coordination normal.     Gait: Gait is intact. Gait normal.     Comments: In/out of chair and on/off exam table without difficulty; gait sure and steady in clinic; bilateral hand grasp equal 5/5  Psychiatric:        Attention and Perception: Attention and perception normal.        Mood and Affect: Mood and affect normal.        Speech: Speech normal.        Behavior: Behavior normal. Behavior is cooperative.        Thought Content: Thought content normal.        Cognition and Memory: Cognition and memory normal.        Judgment: Judgment normal.           Assessment & Plan:  A-acute rhinosinusitis, acute bronchitis, nicotine dependence  P-Continue tessalon pearls 200mg  po TID prn cough and albuterol inhaler 158mcg/act 1-2 puffs po q4-6h prn protracted  cough/wheezing/chest tightness, finish augmentin 875mg  po BID doses, continue nasal saline 2 sprays each nostril q2h wa prn congestion/thick mucous, flonase nasal 1 spray each nostril BID and Start mucinex 600mg  po bid x 3 days given UD from clinic stock Increase water intake to keep urine pale yellow clear and voiding every 2-4 hours while awake  Sp02 improving and BBS CTA on auscultation today.  Occasional cough observed in exam room today. Notify clinic staff if worsening cough/fever/chills/vomiting after cough/shortness of breath  Exitcare handout on bronchitis.  Patient agreed with plan of care and had no further questions at this time.  Encouraged patient to use nicotine replacement therapy/stop smoking.  Talk with HR and PCM regarding insurance approval of her lung cancer screening test.  Patient taking steps towards quitting needed money from paycheck to pay for nicorette Rx.  Patient stated she would bring in letter from home to [facility] tomorrow.

## 2023-01-16 ENCOUNTER — Encounter: Payer: Self-pay | Admitting: Registered Nurse

## 2023-01-16 ENCOUNTER — Ambulatory Visit: Payer: No Typology Code available for payment source | Admitting: Registered Nurse

## 2023-01-16 VITALS — BP 126/76 | HR 72 | Temp 98.1°F

## 2023-01-16 DIAGNOSIS — J209 Acute bronchitis, unspecified: Secondary | ICD-10-CM

## 2023-01-16 DIAGNOSIS — F17210 Nicotine dependence, cigarettes, uncomplicated: Secondary | ICD-10-CM

## 2023-01-16 DIAGNOSIS — J019 Acute sinusitis, unspecified: Secondary | ICD-10-CM

## 2023-01-16 MED ORDER — PREDNISONE 10 MG PO TABS: 40.0000 mg | ORAL_TABLET | Freq: Every day | ORAL | 0 refills | Status: AC

## 2023-01-16 MED ORDER — DOXYCYCLINE HYCLATE 100 MG PO TABS: 100.0000 mg | ORAL_TABLET | Freq: Two times a day (BID) | ORAL | 0 refills | Status: AC

## 2023-01-16 NOTE — Progress Notes (Signed)
Subjective:    Patient ID: Rebecca Sweeney, female    DOB: 12/20/1960, 62 y.o.   MRN: [id number]  Married 62y/o caucasian female here for re-evaluation cough getting worse and noticed wheezing again  Cough is wet but not much coming out  Denied n/v/d/vomiting after cough/fever or chills.  Sinus pain and pressure improved almost completely resolved left side cheek last of pain.  Using albuterol inhaler upon wakeup, lunch and before bed.  Productive yellow mucous color unchanged.  Wondering if she needs something to dry up.  Has not taken any prednisone today.  Using nasal saline, antihistamine OTC.  Smoked 6 cigarettes yesterday.  Plans to go to pharmacy today to get nicotine gum Rx I sent in last week.  Family encouraging her to quit smoking also.  Did not find insurance letter to bring to [facility] [place] yet for lung cancer screening CT from [facility] not approved.      Review of Systems  Constitutional:  Positive for fatigue. Negative for chills and fever.  HENT:  Positive for congestion, postnasal drip, rhinorrhea, sinus pressure and sinus pain. Negative for dental problem, ear discharge, ear pain, facial swelling, mouth sores, nosebleeds, sneezing and sore throat.   Respiratory:  Positive for cough and wheezing. Negative for shortness of breath and stridor.   Cardiovascular:  Negative for chest pain.  Gastrointestinal:  Negative for diarrhea, nausea and vomiting.  Genitourinary:  Negative for difficulty urinating.  Musculoskeletal:  Negative for neck pain and neck stiffness.  Skin:  Negative for color change and rash.  Neurological:  Negative for dizziness, tremors, syncope, facial asymmetry, speech difficulty, weakness, light-headedness and headaches.  Hematological:  Negative for adenopathy.  Psychiatric/Behavioral:  Negative for agitation, confusion and sleep disturbance.        Objective:   Physical Exam Vitals and nursing note reviewed.  Constitutional:      General: She is awake. She is not  in acute distress.    Appearance: Normal appearance. She is well-developed, well-groomed and normal weight. She is ill-appearing. She is not toxic-appearing or diaphoretic.  HENT:     Head: Normocephalic and atraumatic. No right periorbital erythema or left periorbital erythema.     Jaw: There is normal jaw occlusion. No trismus.     Salivary Glands: Right salivary gland is not diffusely enlarged or tender. Left salivary gland is not diffusely enlarged or tender.     Right Ear: Hearing, ear canal and external ear normal. No decreased hearing noted. No laceration, drainage, swelling or tenderness. A middle ear effusion is present. There is no impacted cerumen. No foreign body. No mastoid tenderness. No PE tube. No hemotympanum. Tympanic membrane is not injected, scarred, perforated, erythematous, retracted or bulging.     Left Ear: Hearing, ear canal and external ear normal. No decreased hearing noted. No laceration, drainage, swelling or tenderness. A middle ear effusion is present. There is no impacted cerumen. No foreign body. No mastoid tenderness. No PE tube. No hemotympanum. Tympanic membrane is not injected, scarred, perforated, erythematous, retracted or bulging.     Ears:     Comments: Bilateral TMs intact air fluid level clear     Nose: Mucosal edema, congestion and rhinorrhea present. No nasal deformity, septal deviation or laceration. Rhinorrhea is clear.     Right Turbinates: Enlarged and swollen. Not pale.     Left Turbinates: Enlarged and swollen. Not pale.     Right Sinus: No maxillary sinus tenderness or frontal sinus tenderness.     Left Sinus:  Maxillary sinus tenderness present. No frontal sinus tenderness.     Comments: Bilateral allergic shiners; nasal turbinates edema erythema clear discharge; cobblestoning posterior pharynx    Mouth/Throat:     Lips: Pink. No lesions.     Mouth: Mucous membranes are moist. Mucous membranes are not pale, not dry and not cyanotic. No  lacerations, oral lesions or angioedema.     Dentition: Abnormal dentition. Has dentures. No dental abscesses or gum lesions.     Tongue: No lesions. Tongue does not deviate from midline.     Palate: No mass and lesions.     Pharynx: Uvula midline. Pharyngeal swelling and posterior oropharyngeal erythema present. No oropharyngeal exudate or uvula swelling.     Tonsils: No tonsillar exudate or tonsillar abscesses. 0 on the right. 0 on the left.  Eyes:     General: Lids are normal. Vision grossly intact. Gaze aligned appropriately. Allergic shiner present. No scleral icterus.       Right eye: No foreign body, discharge or hordeolum.        Left eye: No foreign body, discharge or hordeolum.     Extraocular Movements: Extraocular movements intact.     Right eye: Normal extraocular motion and no nystagmus.     Left eye: Normal extraocular motion and no nystagmus.     Conjunctiva/sclera: Conjunctivae normal.     Right eye: Right conjunctiva is not injected. No chemosis, exudate or hemorrhage.    Left eye: Left conjunctiva is not injected. No chemosis, exudate or hemorrhage.    Pupils: Pupils are equal, round, and reactive to light. Pupils are equal.     Right eye: Pupil is round and reactive.     Left eye: Pupil is round and reactive.  Neck:     Thyroid: No thyroid mass or thyromegaly.     Trachea: Trachea and phonation normal. No tracheal tenderness or tracheal deviation.  Cardiovascular:     Rate and Rhythm: Normal rate and regular rhythm.     Pulses: Normal pulses.          Radial pulses are 2+ on the right side and 2+ on the left side.     Heart sounds: Normal heart sounds, S1 normal and S2 normal. No murmur heard.    No friction rub. No gallop.  Pulmonary:     Effort: Pulmonary effort is normal. No accessory muscle usage or respiratory distress.     Breath sounds: No stridor. Examination of the right-middle field reveals wheezing. Examination of the right-lower field reveals rhonchi.  Examination of the left-lower field reveals rhonchi. Wheezing and rhonchi present. No decreased breath sounds or rales.     Comments: Intermittent wheeze fine right middle; sonorous rhonchi bilateral bases; rare cough nonproductive in exam room spoke full sentences without difficulty sp02 worsening today  95-96% without intermittent monitoring in exam room Chest:     Chest wall: No tenderness.  Abdominal:     General: There is no distension.     Palpations: Abdomen is soft.  Musculoskeletal:        General: No tenderness. Normal range of motion.     Right hand: Normal strength. Normal capillary refill.     Left hand: Normal strength. Normal capillary refill.     Cervical back: Normal range of motion and neck supple. No swelling, edema, deformity, erythema, signs of trauma, lacerations, rigidity, spasms, torticollis, tenderness or crepitus. No pain with movement or muscular tenderness. Normal range of motion.     Thoracic back: No  swelling, edema, deformity, signs of trauma, lacerations, spasms or tenderness. Normal range of motion.     Right lower leg: No edema.     Left lower leg: No edema.  Lymphadenopathy:     Head:     Right side of head: No submental, submandibular, tonsillar, preauricular, posterior auricular or occipital adenopathy.     Left side of head: No submental, submandibular, tonsillar, preauricular, posterior auricular or occipital adenopathy.     Cervical: No cervical adenopathy.     Right cervical: No superficial, deep or posterior cervical adenopathy.    Left cervical: No superficial, deep or posterior cervical adenopathy.  Skin:    General: Skin is warm and dry.     Capillary Refill: Capillary refill takes less than 2 seconds.     Coloration: Skin is not ashen, cyanotic, jaundiced, mottled, pale or sallow.     Findings: No abrasion, abscess, bruising, burn, ecchymosis, erythema, signs of injury, laceration, lesion, petechiae, rash or wound.     Nails: There is no  clubbing.  Neurological:     General: No focal deficit present.     Mental Status: She is alert and oriented to person, place, and time. Mental status is at baseline. She is not disoriented.     GCS: GCS eye subscore is 4. GCS verbal subscore is 5. GCS motor subscore is 6.     Cranial Nerves: Cranial nerves 2-12 are intact. No cranial nerve deficit, dysarthria or facial asymmetry.     Sensory: Sensation is intact. No sensory deficit.     Motor: Motor function is intact. No weakness, tremor, atrophy, abnormal muscle tone or seizure activity.     Coordination: Coordination is intact. Coordination normal.     Gait: Gait is intact. Gait normal.     Comments: In/out of chair and on/off exam table without difficulty; gait sure and steady in clinic and warehouse; bilateral hand grasp equal 5/5  Psychiatric:        Attention and Perception: Attention and perception normal.        Mood and Affect: Mood and affect normal.        Speech: Speech normal.        Behavior: Behavior normal. Behavior is cooperative.        Thought Content: Thought content normal.        Cognition and Memory: Cognition and memory normal.        Judgment: Judgment normal.           Assessment & Plan:   A-acute rhinosinusitis, acute bronchitis, nicotine dependence   P-Continue tessalon pearls 200mg  po TID prn cough and albuterol inhaler 155mcg/act 1-2 puffs po q4-6h prn protracted cough/wheezing/chest tightness, start prednisone 10mg  take 4 tabs po daily with breakfast x 4 days #21 RF0 dispensed to patient from [facility] and start doxycycline 100mg  po BID x 7 days #20 RF0 dispensed from [facility] to patient today also .  Discussed wear sun protective clothing/sunblock if outside as may sunburn easier with use.  May take with or without food.  continue nasal saline 2 sprays each nostril q2h wa prn congestion/thick mucous given 1 bottle from clinic stock.  Consider starting OTC, flonase nasal 1 spray each nostril BID.  Stop  mucinex.  May use otc cough suppressant/cough drops per manufacturer's instructions.  May continue honey 1 tablespoon every 4 hours prn cough.  Increase water intake to keep urine pale yellow clear and voiding every 2-4 hours while awake  Sp02 worsening and BBS  now with rhonchi and wheezing on auscultation today.  Has finished augmentin and adding doxycycline to cover for CAP.  Exitcare handout on community acquired pneumonia.  Stop smoking while sick.  Occasional cough observed in exam room today. Notify clinic staff if worsening cough/fever/chills/vomiting after cough/shortness of breath  Patient agreed with plan of care and had no further questions at this time.   Encouraged patient to use nicotine replacement therapy/stop smoking.  Talk with HR and PCM regarding insurance approval of her lung cancer screening test. She stated she would pick up Rx from pharmacy today for nicotine gum.  Patient stated she would bring in letter from home to [facility] tomorrow.

## 2023-01-16 NOTE — Patient Instructions (Signed)
Smoking Tobacco Information, Adult Smoking tobacco can be harmful to your health. Tobacco contains a toxic colorless chemical called nicotine. Nicotine causes changes in your brain that make you want more and more. This is called addiction. This can make it hard to stop smoking once you start. Tobacco also has other toxic chemicals that can hurt your body and raise your risk of many cancers. Menthol or "lite" tobacco or cigarette brands are not safer than regular brands. How can smoking tobacco affect me? Smoking tobacco puts you at risk for: Cancer. Smoking is most commonly associated with lung cancer, but can also lead to cancer in other parts of the body. Chronic obstructive pulmonary disease (COPD). This is a long-term lung condition that makes it hard to breathe. It also gets worse over time. High blood pressure (hypertension), heart disease, stroke, heart attack, and lung infections, such as pneumonia. Cataracts. This is when the lenses in the eyes become clouded. Digestive problems. This may include peptic ulcers, heartburn, and gastroesophageal reflux disease (GERD). Oral health problems, such as gum disease, mouth sores, and tooth loss. Loss of taste and smell. Smoking also affects how you look and smell. Smoking may cause: Wrinkles. Yellow or stained teeth, fingers, and fingernails. Bad breath. Bad-smelling clothes and hair. Smoking tobacco can also affect your social life, because: It may be challenging to find places to smoke when away from home. Many workplaces, [employer], hotels, and public places are tobacco-free. Smoking is expensive. This is due to the cost of tobacco and the long-term costs of treating health problems from smoking. Secondhand smoke may affect those around you. Secondhand smoke can cause lung cancer, breathing problems, and heart disease. Children of smokers have a higher risk for: Sudden infant death syndrome (SIDS). Ear infections. Lung infections. What  actions can I take to prevent health problems? Quit smoking  Do not start smoking. Quit if you already smoke. Do not replace cigarette smoking with vaping devices, such as e-cigarettes. Make a plan to quit smoking and commit to it. Look for programs to help you, and ask your health care provider for recommendations and ideas. Set a date and write down all the reasons you want to quit. Let your friends and family know you are quitting so they can help and support you. Consider finding friends who also want to quit. It can be easier to quit with someone else, so that you can support each other. Talk with your health care provider about using nicotine replacement medicines to help you quit. These include gum, lozenges, patches, sprays, or pills. If you try to quit but return to smoking, stay positive. It is common to slip up when you first quit, so take it one day at a time. Be prepared for cravings. When you feel the urge to smoke, chew gum or suck on hard candy. Lifestyle Stay busy. Take care of your body. Get plenty of exercise, eat a healthy diet, and drink plenty of water. Find ways to manage your stress, such as meditation, yoga, exercise, or time spent with friends and family. Ask your health care provider about having regular tests (screenings) to check for cancer. This may include blood tests, imaging tests, and other tests. Where to find support To get support to quit smoking, consider: Asking your health care provider for more information and resources. Joining a support group for people who want to quit smoking in your local community. There are many effective programs that may help you to quit. Calling the smokefree.gov counselor  helpline at 1-800-QUIT-NOW 438-479-7990). Where to find more information You may find more information about quitting smoking from: Centers for Disease Control and Prevention: http://www.osborne.com/ [employer]: smokefree.gov American Lung Association:  freedomfromsmoking.org Contact a health care provider if: You have problems breathing. Your lips, nose, or fingers turn blue. You have chest pain. You are coughing up blood. You feel like you will faint. You have other health changes that cause you to worry. Summary Smoking tobacco can negatively affect your health, the health of those around you, your finances, and your social life. Do not start smoking. Quit if you already smoke. If you need help quitting, ask your health care provider. Consider joining a support group for people in your local community who want to quit smoking. There are many effective programs that may help you to quit. This information is not intended to replace advice given to you by your health care provider. Make sure you discuss any questions you have with your health care provider. Document Revised: 08/07/2021 Document Reviewed: 08/07/2021 Elsevier Patient Education  2024 Else[employer] Inc. Community-Acquired Pneumonia, Adult Pneumonia is a lung infection that causes inflammation and the buildup of mucus and fluids in the lungs. This may cause coughing and difficulty breathing. Community-acquired pneumonia is pneumonia that develops in people who are not, and have not recently been, in a hospital or other health care facility. Usually, pneumonia develops as a result of an illness that is caused by a virus, such as the common cold and the flu (influenza). It can also be caused by bacteria or fungi. While the common cold and influenza can pass from person to person (are contagious), pneumonia itself is not considered contagious. What are the causes? This condition may be caused by: Viruses. Bacteria. Fungi. What increases the risk? The following factors may make you more likely to develop this condition: Being over age 106 or having certain medical conditions, such as: A long-term (chronic) disease, such as: chronic obstructive pulmonary disease (COPD), asthma, heart  failure, diabetes, or kidney disease. A condition that increases the risk of breathing in (aspirating) mucus and other fluids from your mouth and nose. A weakened body defense system (immune system). Having had your spleen removed (splenectomy). The spleen is the organ that helps fight germs and infections. Not cleaning your teeth and gums well (poor dental hygiene). Using tobacco products. Traveling to places where germs that cause pneumonia are present or being near certain animals or animal habitats that could have germs that cause pneumonia. What are the signs or symptoms? Symptoms of this condition include: A dry cough or a wet (productive) cough. A fever, sweating, or chills. Chest pain, especially when breathing deeply or coughing. Fast breathing, difficulty breathing, or shortness of breath. Tiredness (fatigue) and muscle aches. How is this diagnosed? This condition may be diagnosed based on your medical history or a physical exam. You may also have tests, including: Imaging, such as a chest X-ray or lung ultrasound. Tests of: The level of oxygen and other gases in your blood. Mucus from your lungs (sputum). Fluid around your lungs (pleural fluid). Your urine. How is this treated? Treatment for this condition depends on many factors, such as the cause of your pneumonia, your medicines, and other medical conditions that you have. For most adults, pneumonia may be treated at home. In some cases, treatment must happen in a hospital and may include: Medicines that are given by mouth (orally) or through an IV, including: Antibiotic medicines, if bacteria caused the pneumonia. Medicines that  kill viruses (antiviral medicines), if a virus caused the pneumonia. Oxygen therapy. Severe pneumonia, although rare, may require the following treatments: Mechanical ventilation.This procedure uses a machine to help you breathe if you cannot breathe well on your own or maintain a safe level of  blood oxygen. Thoracentesis. This procedure removes any buildup of pleural fluid to help with breathing. Follow these instructions at home:  Medicines Take over-the-counter and prescription medicines only as told by your health care provider. Take cough medicine only if you have trouble sleeping. Cough medicine can prevent your body from removing mucus from your lungs. If you were prescribed antibiotics, take them as told by your health care provider. Do not stop taking the antibiotic even if you start to feel better. Lifestyle     Do not drink alcohol. Do not use any products that contain nicotine or tobacco. These products include cigarettes, chewing tobacco, and vaping devices, such as e-cigarettes. If you need help quitting, ask your health care provider. Eat a healthy diet. This includes plenty of vegetables, fruits, whole grains, low-fat dairy products, and lean protein. General instructions Rest a lot and get at least 8 hours of sleep each night. Sleep in a partly upright position at night. Place a few pillows under your head or sleep in a reclining chair. Return to your normal activities as told by your health care provider. Ask your health care provider what activities are safe for you. Drink enough fluid to keep your urine pale yellow. This helps to thin the mucus in your lungs. If your throat is sore, gargle with a mixture of salt and water 3-4 times a day or as needed. To make salt water, completely dissolve -1 tsp (3-6 g) of salt in 1 cup (237 mL) of warm water. Keep all follow-up visits. How is this prevented? You can lower your risk of developing community-acquired pneumonia by: Getting the pneumonia vaccine. There are different types and schedules of pneumonia vaccines. Ask your health care provider which option is best for you. Consider getting the pneumonia vaccine if: You are older than 61 years of age. You are 23-79 years of age and are receiving cancer treatment, have  chronic lung disease, or have other medical conditions that affect your immune system. Ask your health care provider if this applies to you. Getting your influenza vaccine every year. Ask your health care provider which type of vaccine is best for you. Getting regular dental checkups. Washing your hands often with soap and water for at least 20 seconds. If soap and water are not available, use hand sanitizer. Contact a health care provider if: You have a fever. You have trouble sleeping because you cannot control your cough with cough medicine. Get help right away if: Your shortness of breath becomes worse. Your chest pain increases. Your sickness becomes worse, especially if you are an older adult or have a weak immune system. You cough up blood. These symptoms may be an emergency. Get help right away. Call 911. Do not wait to see if the symptoms will go away. Do not drive yourself to the hospital. Summary Pneumonia is an infection of the lungs. Community-acquired pneumonia develops in people who have not been in the hospital. It can be caused by bacteria, viruses, or fungi. This condition may be treated with antibiotics or antiviral medicines. Severe pneumonia may require a hospital stay and treatment to help with breathing. This information is not intended to replace advice given to you by your health care provider.  Make sure you discuss any questions you have with your health care provider. Document Revised: 10/10/2021 Document Reviewed: 10/10/2021 Elsevier Patient Education  2024 [employer].

## 2023-01-21 ENCOUNTER — Telehealth: Payer: Self-pay | Admitting: Registered Nurse

## 2023-01-21 ENCOUNTER — Encounter: Payer: Self-pay | Admitting: Registered Nurse

## 2023-01-21 NOTE — Telephone Encounter (Signed)
Last seen 01/16/23 seen in [facility] today stated symptoms improved feeling better denied concerns cough/URI/eustachian tube dysfunction stated did not need clinic appt for follow up today.  Respirations even and unlabored skin warm dry and pink no observed cough/nasal congestion/throat clearing in warehouse.  Gait sure and steady.

## 2023-02-09 NOTE — Progress Notes (Signed)
noted 

## 2023-04-09 ENCOUNTER — Encounter: Payer: Self-pay | Admitting: Registered Nurse

## 2023-04-09 ENCOUNTER — Other Ambulatory Visit: Payer: Self-pay | Admitting: Registered Nurse

## 2023-04-09 DIAGNOSIS — E559 Vitamin D deficiency, unspecified: Secondary | ICD-10-CM

## 2023-04-09 MED ORDER — VITAMIN D3 50 MCG (2000 UT) PO CAPS: 2000.0000 [IU] | ORAL_CAPSULE | Freq: Every day | ORAL | 2 refills | Status: AC

## 2023-04-09 MED ORDER — CHOLECALCIFEROL 1.25 MG (50000 UT) PO TABS: 1.0000 | ORAL_TABLET | ORAL | 0 refills | Status: AC

## 2023-04-09 NOTE — Telephone Encounter (Signed)
Latest Reference Range & Units 04/21/17 00:00 07/25/17 11:23 01/10/20 14:17 07/05/21 09:17 10/12/21 10:46 01/01/22 11:00 10/08/22 08:20  Vitamin D, 25-Hydroxy 30.0 - 100.0 ng/mL 21 (E) 21.1 (L) 29.9 (L) 20.0 (L) 57.2 39.1 27.3 (L)  (L): Data is abnormally low (E): External lab result  Spoke with patient still on FMLA taking care of son in [place] at this time. He is still in rehab.  Unknown RTW date at this time as rehab going slower than expected. She ran out of 50,000 units weekly vitamin D and does need refill.  Discussed start 2000 units daily OTC or may pick up rx from [facility] sent electronically to her pharmacy of choice take with food.  Restart 50,000 units x 12 doses during the winter electronic Rx sent to start 10 Jul 2023 #12 RF0 and then restart 2000 units po daily with food Feb-Nov 2025.  Patient verbalized understanding information/instructions, agreed with plan of care and had no further questions at this time.

## 2023-05-08 ENCOUNTER — Encounter: Payer: Self-pay | Admitting: Registered Nurse

## 2023-05-08 ENCOUNTER — Ambulatory Visit: Payer: Self-pay | Admitting: Registered Nurse

## 2023-05-08 VITALS — BP 118/80 | HR 70 | Resp 16

## 2023-05-08 DIAGNOSIS — Z6379 Other stressful life events affecting family and household: Secondary | ICD-10-CM

## 2023-05-08 DIAGNOSIS — R519 Headache, unspecified: Secondary | ICD-10-CM

## 2023-05-09 NOTE — Progress Notes (Signed)
Subjective:    Patient ID: Rebecca Sweeney, female    DOB: 06-Jul-1961, 62 y.o.   MRN: [id number]  62y/o caucasian female established patient here for BP evaluation as having headache waking her up from sleep at night since returning home to work after caring for son in [place] after he was injured head injury and leg fracture.  Son still in therapy.  Spouse injured also.  She is worrying about them.  Work stressful.  Still smoking.  Does not want to quit at this time.  Denied chest pain, visual changes or worst headache of her life, loss of bowel/bladder control, saddle paresthesias or arm/leg weakness.  Headache typically starts scalp back of head and radiates into neck.  6-7/10 at work right now.  Has tried biofreeze helps some at night.  Has tried tylenol and ibuprofen OTC also helps some.      Review of Systems  Constitutional:  Positive for fatigue. Negative for chills and fever.  HENT:  Positive for postnasal drip. Negative for dental problem, drooling, ear discharge, ear pain, facial swelling, mouth sores, nosebleeds, rhinorrhea, sinus pressure, sinus pain, sore throat, trouble swallowing and voice change.   Eyes:  Negative for photophobia and visual disturbance.  Respiratory:  Negative for choking, chest tightness and shortness of breath.   Cardiovascular:  Negative for chest pain and palpitations.  Gastrointestinal:  Negative for diarrhea and vomiting.  Genitourinary:  Negative for difficulty urinating.  Musculoskeletal:  Positive for myalgias and neck pain. Negative for gait problem and neck stiffness.  Skin:  Negative for color change, rash and wound.  Neurological:  Positive for headaches. Negative for dizziness, tremors, seizures, syncope, facial asymmetry, speech difficulty, weakness, light-headedness and numbness.  Psychiatric/Behavioral:  Positive for sleep disturbance. Negative for agitation and confusion.        Objective:   Physical Exam Vitals and nursing note reviewed.   Constitutional:      General: She is awake. She is not in acute distress.    Appearance: Normal appearance. She is well-developed, well-groomed and normal weight. She is not ill-appearing, toxic-appearing or diaphoretic.  HENT:     Head: Normocephalic and atraumatic.     Jaw: There is normal jaw occlusion.     Salivary Glands: Right salivary gland is not diffusely enlarged or tender. Left salivary gland is not diffusely enlarged or tender.     Right Ear: Hearing and external ear normal. No decreased hearing noted. No laceration, drainage, swelling or tenderness. A middle ear effusion is present. There is no impacted cerumen. No foreign body. No mastoid tenderness. No PE tube. No hemotympanum. Tympanic membrane is not injected, scarred, perforated, erythematous, retracted or bulging.     Left Ear: Hearing and external ear normal. No decreased hearing noted. No laceration, drainage, swelling or tenderness. A middle ear effusion is present. There is no impacted cerumen. No foreign body. No mastoid tenderness. No PE tube. No hemotympanum. Tympanic membrane is not injected, scarred, perforated, erythematous, retracted or bulging.     Ears:     Comments: Bilateral TMs intact air fluid level clear    Nose: Nose normal. No congestion or rhinorrhea.     Right Turbinates: Not enlarged, swollen or pale.     Left Turbinates: Not enlarged, swollen or pale.     Right Sinus: No maxillary sinus tenderness or frontal sinus tenderness.     Left Sinus: No maxillary sinus tenderness.     Mouth/Throat:     Lips: Pink. No lesions.  Mouth: Mucous membranes are moist. No oral lesions or angioedema.     Dentition: Abnormal dentition. Has dentures. No gum lesions.     Tongue: No lesions. Tongue does not deviate from midline.     Palate: No mass and lesions.     Pharynx: Uvula midline. Pharyngeal swelling, posterior oropharyngeal erythema and postnasal drip present.     Tonsils: No tonsillar exudate. 0 on the  right. 0 on the left.     Comments: Cobblestoning posterior pharynx; bilateral allergic shiners; nasal sniffing observed in exam room; sinuses not TTP Eyes:     General: Lids are normal. Vision grossly intact. Gaze aligned appropriately. Allergic shiner present. No scleral icterus.       Right eye: No discharge.        Left eye: No discharge.     Extraocular Movements: Extraocular movements intact.     Conjunctiva/sclera: Conjunctivae normal.     Pupils: Pupils are equal, round, and reactive to light.  Neck:     Trachea: Trachea and phonation normal.     Comments: No scalp/paraspinal TTP or edema/erythema; generalized pain with c-spine AROM patient at her baseline lateral bending/extension and flexion decreased; bilateral trapezius muscles tight Cardiovascular:     Rate and Rhythm: Normal rate and regular rhythm.     Pulses: Normal pulses.          Radial pulses are 2+ on the right side and 2+ on the left side.  Pulmonary:     Effort: Pulmonary effort is normal.     Breath sounds: Normal breath sounds and air entry. No stridor or transmitted upper airway sounds. No wheezing.     Comments: Spoke full sentences without difficulty; no cough observed in exam room Abdominal:     Palpations: Abdomen is soft.  Musculoskeletal:        General: No swelling, tenderness, deformity or signs of injury.     Right hand: Normal strength. Normal capillary refill.     Left hand: Normal strength. Normal capillary refill.     Cervical back: Neck supple. No swelling, edema, deformity, erythema, signs of trauma, lacerations, rigidity, spasms, torticollis, tenderness, bony tenderness or crepitus. Pain with movement and muscular tenderness present. No spinous process tenderness. Decreased range of motion.     Thoracic back: No swelling, edema, deformity, signs of trauma, lacerations, spasms or tenderness. Normal range of motion.     Right lower leg: No edema.     Left lower leg: No edema.  Lymphadenopathy:      Head:     Right side of head: No submental, submandibular, tonsillar, preauricular, posterior auricular or occipital adenopathy.     Left side of head: No submental, submandibular, tonsillar, preauricular, posterior auricular or occipital adenopathy.     Cervical: No cervical adenopathy.     Right cervical: No superficial, deep or posterior cervical adenopathy.    Left cervical: No superficial, deep or posterior cervical adenopathy.  Skin:    General: Skin is warm and dry.     Capillary Refill: Capillary refill takes less than 2 seconds.     Coloration: Skin is not ashen, cyanotic, jaundiced, mottled, pale or sallow.     Findings: No abrasion, abscess, acne, bruising, burn, ecchymosis, erythema, signs of injury, laceration, lesion, petechiae, rash or wound.     Nails: There is no clubbing.  Neurological:     General: No focal deficit present.     Mental Status: She is alert and oriented to person, place, and time.  Mental status is at baseline.     GCS: GCS eye subscore is 4. GCS verbal subscore is 5. GCS motor subscore is 6.     Cranial Nerves: Cranial nerves 2-12 are intact. No cranial nerve deficit, dysarthria or facial asymmetry.     Motor: Motor function is intact. No weakness, tremor, atrophy, abnormal muscle tone or seizure activity.     Coordination: Coordination is intact. Coordination normal.     Gait: Gait is intact. Gait normal.     Comments: In/out of chair without difficulty; gait sure and steady in clinic; bilateral hand grasp equal 5/5  Psychiatric:        Attention and Perception: Attention and perception normal.        Mood and Affect: Mood and affect normal.        Speech: Speech normal.        Behavior: Behavior normal. Behavior is cooperative.        Thought Content: Thought content normal.        Cognition and Memory: Cognition and memory normal.        Judgment: Judgment normal.      Given biofreeze and [employer]ac[employer] UD x2 from clinic stock to use at work today  and tomorrow.  Has biofreeze roller to use at home and does help she forgot to apply today prior to work. Patient verbalized understanding information/instructions and had no further questions at that time.     Assessment & Plan:  A-stress due to illness of family member, nonintractable headache unspecified pattern/chronicity  P-Worried about son and his spouse as she had to return to work and no longer able to assist them in [place].  Discussed BP stable.   Avoid dehydration drink water to ensure urine pale yellow clear and voiding every 2-4 hours.  Avoid skipping meals and eat every 2-4 hours. Discussed stretching/heat/ice/epsom salt bath.  Stress/tension headache most likely but could be related to allergies also/sinus.  EAP/teledoc available for her no charge.  Teledoc can prescribe insomnia medication due to family stressors.  Discussed covid is circulating in community if new or worsening symptoms to home covid test as headache/rhinitis is a covid symptoms and it has been mimicking allergies.  Use nasal saline 2 sprays each nostril q2h wa prn congestion/thick mucous.  See RN Burna Mortimer again next week if headache for BP recheck.  Denied n/v.  Seek same day re-evaluation if worst headache of her life, visual changes, repetitive vomiting, neck pain not her usual and worsening.  Exitcare handouts tension headache, headache log, cervicogenic headache.  Patient agreed with plan of care and had no further questions at that time.

## 2023-05-09 NOTE — Patient Instructions (Signed)
Cervicogenic Headache  In a cervicogenic headache, the pain moves from your neck to your head. Most cervicogenic headaches start in the upper part of the neck with the first three cervical bones (cervical vertebrae). What are the causes? The most common cause of this condition is a traumatic injury to the bones and tissues in your neck (cervical spine). Whiplash is an example of a cervical spine injury. Other causes include: Arthritis. Broken bone (fracture). Infection. Tumor. What are the signs or symptoms? The most common symptoms are neck and head pain. The pain is often located on one side. In some cases, there may be head pain without neck pain. Pain may be felt in the neck, back or side of the head, face, or behind the eyes. Other symptoms include: Limited movement in the neck. Arm or shoulder pain. How is this diagnosed? This condition may be diagnosed based on: Your symptoms. A physical exam. An injection that blocks nerve signals (diagnostic nerve block). Imaging tests, such as: X-rays. CT scan. MRI. A cervicogenic headache is diagnosed when a cause can be found in the cervical spine and other causes of headaches can be ruled out. How is this treated? Treatment for this condition may depend on the underlying condition. Treatment may include: Medicines, such as: NSAIDs, such as ibuprofen. Muscle relaxants. Physical therapy. Massage therapy. Complementary therapies, such as: Biofeedback. Meditation. Acupuncture. Nerve block injections to reduce the pain. Botulinum toxin injections. Your treatment plan may involve working with a pain management team that includes your primary health care provider, a pain management specialist, a neurologist, and a physical therapist. Follow these instructions at home: Take over-the-counter and prescription medicines only as told by your health care provider. Do exercises at home as told by your physical therapist. Return to your normal  activities as told by your health care provider. Ask your health care provider what activities are safe for you. Avoid activities that trigger your headaches. Maintain good neck support and posture at home and at work. Keep all follow-up visits. This is important. Contact a health care provider if: You have headaches that are getting worse and happening more often. You have headaches with any of the following: Fever. Numbness. Weakness. Dizziness. Nausea or vomiting. Get help right away if: You have a sudden and severe headache. This symptom may be an emergency. Get help right away. Call 911. Do not wait to see if this symptom will go away. Do not drive yourself to the hospital. Summary A cervicogenic headache is a headache caused by a condition that affects the bones and tissues in your cervical spine. Your health care provider may diagnose this condition with a physical exam, a diagnostic nerve block, and imaging tests. Treatment may include medicine to reduce pain and inflammation, physical therapy, and nerve block injections. Complementary therapies, such as acupuncture and meditation, may be added to other treatments. Your treatment plan may involve working with a pain management team that includes your primary health care provider, a pain management specialist, a neurologist, and a physical therapist. This information is not intended to replace advice given to you by your health care provider. Make sure you discuss any questions you have with your health care provider. Document Revised: 02/15/2021 Document Reviewed: 02/15/2021 Else[employer] Patient Education  2024 [employer] Tension Headache, Adult A tension headache is a feeling of pain, pressure, or aching over the front and sides of the head. The pain can be dull, or it can feel tight. There are two types of tension  headache: Episodic tension headache. This is when the headaches happen fewer than 15 days a month. Chronic tension  headache. This is when the headaches happen more than 15 days a month during a 60-month period. A tension headache can last from 30 minutes to several days. It is the most common kind of headache. Tension headaches are not normally associated with nausea or vomiting, and they do not get worse with physical activity. What are the causes? The exact cause of this condition is not known. Tension headaches are often triggered by stress, anxiety, or depression. Other triggers may include: Alcohol. Too much caffeine or caffeine withdrawal. Respiratory infections, such as colds, flu, or sinus infections. Dental problems or teeth clenching. Fatigue. Holding your head and neck in the same position for a long period of time, such as while using a computer. Smoking. Arthritis of the neck. What are the signs or symptoms? Symptoms of this condition include: A feeling of pressure or tightness around the head. Dull, aching head pain. Pain over the front and sides of the head. Tenderness in the muscles of the head, neck, and shoulders. How is this diagnosed? This condition may be diagnosed based on your symptoms, your medical history, and a physical exam. If your symptoms are severe or unusual, you may have imaging tests, such as a CT scan or an MRI of your head. Your vision may also be checked. How is this treated? This condition may be treated with lifestyle changes and with medicines that help relieve symptoms. Follow these instructions at home: Managing pain Take over-the-counter and prescription medicines only as told by your health care provider. When you have a headache, lie down in a dark, quiet room. If directed, put ice on your head and neck. To do this: Put ice in a plastic bag. Place a towel between your skin and the bag. Leave the ice on for 20 minutes, 2-3 times a day. Remove the ice if your skin turns bright red. This is very important. If you cannot feel pain, heat, or cold, you have a  greater risk of damage to the area. If directed, apply heat to the back of your neck as often as told by your health care provider. Use the heat source that your health care provider recommends, such as a moist heat pack or a heating pad. Place a towel between your skin and the heat source. Leave the heat on for 20-30 minutes. Remove the heat if your skin turns bright red. This is especially important if you are unable to feel pain, heat, or cold. You have a greater risk of getting burned. Eating and drinking Eat meals on a regular schedule. If you drink alcohol: Limit how much you have to: 0-1 drink a day for women who are not pregnant. 0-2 drinks a day for men. Know how much alcohol is in your drink. In the [place] one drink equals one 12 oz bottle of beer (355 mL), one 5 oz glass of wine (148 mL), or one 1 oz glass of hard liquor (44 mL). Drink enough fluid to keep your urine pale yellow. Decrease your caffeine intake, or stop using caffeine. Lifestyle Get 7-9 hours of sleep each night, or get the amount of sleep recommended by your health care provider. At bedtime, remove computers, phones, and tablets from your room. Find ways to manage your stress. This may include: Exercise. Deep breathing exercises. Yoga. Listening to music. Positive mental imagery. Try to sit up straight and avoid tensing your  muscles. Do not use any products that contain nicotine or tobacco. These include cigarettes, chewing tobacco, and vaping devices, such as e-cigarettes. If you need help quitting, ask your health care provider. General instructions  Avoid any headache triggers. Keep a journal to help find out what may trigger your headaches. For example, write down: What you eat and drink. How much sleep you get. Any change to your diet or medicines. Keep all follow-up visits. This is important. Contact a health care provider if: Your headache does not get better. Your headache comes back. You are  sensitive to sounds, light, or smells because of a headache. You have nausea or you vomit. Your stomach hurts. Get help right away if: You suddenly develop a severe headache, along with any of the following: A stiff neck. Nausea and vomiting. Confusion. Weakness in one part or one side of your body. Double vision or loss of vision. Shortness of breath. Rash. Unusual sleepiness. Fever or chills. Trouble speaking. Pain in your eye or ear. Trouble walking or balancing. Feeling faint or passing out. Summary A tension headache is a feeling of pain, pressure, or aching over the front and sides of the head. A tension headache can last from 30 minutes to several days. It is the most common kind of headache. This condition may be diagnosed based on your symptoms, your medical history, and a physical exam. This condition may be treated with lifestyle changes and with medicines that help relieve symptoms. This information is not intended to replace advice given to you by your health care provider. Make sure you discuss any questions you have with your health care provider. Document Revised: 05/11/2020 Document Reviewed: 05/11/2020 [employer] Patient Education  2024 [employer] Form - Headache Record There are many types and causes of headaches. A headache record can help guide your treatment plan. Use this form to record the details. Bring this form with you to your follow-up visits. Follow your health care provider's instructions on how to describe your headache. You may be asked to: Use a pain scale. This is a tool to rate the intensity of your headache using words or numbers. Describe what your headache feels like, such as dull, achy, throbbing, or sharp. Headache record Date: _______________ Time (from start to end): ____________________ Location of the headache: _________________________ Intensity of the headache: ____________________ Description of the headache:  ______________________________________________________________ Hours of sleep the night before the headache: __________ Food or drinks before the headache started: ______________________________________________________________________________________ Events before the headache started: _______________________________________________________________________________________________ Symptoms before the headache started: __________________________________________________________________________________________ Symptoms during the headache: __________________________________________________________________________________________________ Treatment: ________________________________________________________________________________________________________________ Effect of treatment: _________________________________________________________________________________________________________ Other comments: ___________________________________________________________________________________________________________ Date: _______________ Time (from start to end): ____________________ Location of the headache: _________________________ Intensity of the headache: ____________________ Description of the headache: ______________________________________________________________ Hours of sleep the night before the headache: __________ Food or drinks before the headache started: ______________________________________________________________________________________ Events before the headache started: ____________________________________________________________________________________________ Symptoms before the headache started: _________________________________________________________________________________________ Symptoms during the headache: _______________________________________________________________________________________________ Treatment:  ________________________________________________________________________________________________________________ Effect of treatment: _________________________________________________________________________________________________________ Other comments: ___________________________________________________________________________________________________________ Date: _______________ Time (from start to end): ____________________ Location of the headache: _________________________ Intensity of the headache: ____________________ Description of the headache: ______________________________________________________________ Hours of sleep the night before the headache: __________ Food or drinks before the headache started: ______________________________________________________________________________________ Events before the headache started: ____________________________________________________________________________________________ Symptoms before the headache started: _________________________________________________________________________________________ Symptoms during the headache: _______________________________________________________________________________________________ Treatment: ________________________________________________________________________________________________________________ Effect of treatment: _________________________________________________________________________________________________________ Other comments: ___________________________________________________________________________________________________________ Date: _______________ Time (from start to end): ____________________ Location of the headache: _________________________ Intensity of the headache:  ____________________ Description of the headache: ______________________________________________________________ Hours of sleep the night before the headache: _________ Food or drinks before the headache started:  ______________________________________________________________________________________ Events before the headache started: ____________________________________________________________________________________________ Symptoms before the headache started: _________________________________________________________________________________________ Symptoms during the headache: _______________________________________________________________________________________________ Treatment: ________________________________________________________________________________________________________________ Effect of treatment: _________________________________________________________________________________________________________ Other comments: ___________________________________________________________________________________________________________ Date: _______________ Time (from start to end): ____________________ Location of the headache: _________________________ Intensity of the headache: ____________________ Description of the headache: ______________________________________________________________ Hours of sleep the night before the headache: _________ Food or drinks before the headache started: ______________________________________________________________________________________ Events before the headache started: ____________________________________________________________________________________________ Symptoms before the headache started: _________________________________________________________________________________________ Symptoms during the headache: _______________________________________________________________________________________________ Treatment: ________________________________________________________________________________________________________________ Effect of treatment: _________________________________________________________________________________________________________ Other  comments: ___________________________________________________________________________________________________________ This information is not intended to replace advice given to you by your health care provider. Make sure you discuss any questions you have with your health care provider. Document Revised: 01/10/2021 Document Reviewed: 01/10/2021 [employer] Patient Education  2024 [employer].

## 2023-06-24 ENCOUNTER — Ambulatory Visit: Payer: Self-pay | Admitting: Registered Nurse

## 2023-06-24 ENCOUNTER — Encounter: Payer: Self-pay | Admitting: Registered Nurse

## 2023-06-24 VITALS — BP 117/82 | HR 62 | Temp 97.2°F | Resp 16

## 2023-06-24 DIAGNOSIS — B370 Candidal stomatitis: Secondary | ICD-10-CM

## 2023-06-24 DIAGNOSIS — H6691 Otitis media, unspecified, right ear: Secondary | ICD-10-CM

## 2023-06-24 DIAGNOSIS — F17210 Nicotine dependence, cigarettes, uncomplicated: Secondary | ICD-10-CM

## 2023-06-24 MED ORDER — FLUCONAZOLE 150 MG PO TABS: ORAL_TABLET | ORAL | 0 refills | Status: AC

## 2023-06-24 MED ORDER — AMOXICILLIN-POT CLAVULANATE 875-125 MG PO TABS: 1.0000 | ORAL_TABLET | Freq: Two times a day (BID) | ORAL | Status: AC

## 2023-06-24 NOTE — Progress Notes (Signed)
Subjective:    Patient ID: Rebecca Sweeney, female    DOB: 1960/12/28, 62 y.o.   MRN: [id number]  62y/o feeling off balance/leaning to right yesterday and today wondering if she has ear infection.  Denied ear discharge/pain/fever/chills/n/v/d.  Stated still smoking about a half pack per day but trying to cut down but hard due to stressors at home and work.  Has noticed skin under dentures sore this am ?would like NP to check for rash/ulcer      Review of Systems  Constitutional:  Negative for chills and fever.  HENT:  Positive for ear pain, mouth sores and postnasal drip. Negative for congestion, ear discharge, facial swelling, hearing loss, nosebleeds, sinus pressure, sinus pain, sneezing, tinnitus, trouble swallowing and voice change.   Eyes:  Negative for photophobia, discharge and visual disturbance.  Respiratory:  Negative for cough, shortness of breath, wheezing and stridor.   Cardiovascular:  Negative for chest pain and palpitations.  Gastrointestinal:  Negative for diarrhea, nausea and vomiting.  Genitourinary:  Negative for difficulty urinating.  Musculoskeletal:  Negative for gait problem, neck pain and neck stiffness.  Skin:  Negative for color change, pallor, rash and wound.  Neurological:  Negative for tremors, seizures, syncope, facial asymmetry, speech difficulty, weakness, light-headedness and headaches.  Hematological:  Negative for adenopathy.  Psychiatric/Behavioral:  Negative for agitation, confusion and sleep disturbance.        Objective:   Physical Exam Vitals reviewed.  Constitutional:      General: She is awake. She is not in acute distress.    Appearance: Normal appearance. She is well-developed, well-groomed and normal weight. She is not ill-appearing, toxic-appearing or diaphoretic.  HENT:     Head: Normocephalic and atraumatic.     Jaw: There is normal jaw occlusion. No trismus.     Salivary Glands: Right salivary gland is not diffusely enlarged or  tender. Left salivary gland is not diffusely enlarged or tender.     Right Ear: Hearing, ear canal and external ear normal. No decreased hearing noted. No laceration, drainage, swelling or tenderness. A middle ear effusion is present. There is no impacted cerumen. No foreign body. No mastoid tenderness. No PE tube. No hemotympanum. Tympanic membrane is erythematous and bulging. Tympanic membrane is not injected, scarred, perforated or retracted.     Left Ear: Hearing, ear canal and external ear normal. No decreased hearing noted. No laceration, drainage, swelling or tenderness. A middle ear effusion is present. There is no impacted cerumen. No foreign body. No mastoid tenderness. No PE tube. No hemotympanum. Tympanic membrane is erythematous. Tympanic membrane is not injected, scarred, perforated, retracted or bulging.     Ears:     Comments: Bilateral TMs intact air fluid level clear left; right 50% opacity; bilateral auditory canals without debris; right TM erythema worst 9a-3p and bulging    Nose: Mucosal edema and rhinorrhea present. No nasal deformity, septal deviation, laceration or congestion.     Right Turbinates: Enlarged and swollen. Not pale.     Left Turbinates: Enlarged and swollen. Not pale.     Right Sinus: No maxillary sinus tenderness or frontal sinus tenderness.     Left Sinus: No maxillary sinus tenderness or frontal sinus tenderness.     Mouth/Throat:     Lips: Pink. No lesions.     Mouth: Mucous membranes are moist. Mucous membranes are not pale, not dry and not cyanotic. No lacerations, oral lesions or angioedema.     Dentition: Has dentures. No dental abscesses or  gum lesions.     Tongue: No lesions. Tongue does not deviate from midline.     Palate: Lesions present. No mass.     Pharynx: Uvula midline. Pharyngeal swelling, posterior oropharyngeal erythema and postnasal drip present. No oropharyngeal exudate or uvula swelling.     Tonsils: No tonsillar exudate or tonsillar  abscesses.     Comments: White discharge on erythematous papules hard palate when denture plate removed; cobblestoning posterior pharynx; bilateral allergic shiners; nasal turbinates edema erythema clear discharge Eyes:     General: Lids are normal. Vision grossly intact. Gaze aligned appropriately. Allergic shiner present. No scleral icterus.       Right eye: No foreign body, discharge or hordeolum.        Left eye: No foreign body, discharge or hordeolum.     Extraocular Movements: Extraocular movements intact.     Right eye: Normal extraocular motion and no nystagmus.     Left eye: Normal extraocular motion and no nystagmus.     Conjunctiva/sclera: Conjunctivae normal.     Right eye: Right conjunctiva is not injected. No chemosis, exudate or hemorrhage.    Left eye: Left conjunctiva is not injected. No chemosis, exudate or hemorrhage.    Pupils: Pupils are equal, round, and reactive to light. Pupils are equal.     Right eye: Pupil is round and reactive.     Left eye: Pupil is round and reactive.  Neck:     Thyroid: No thyroid mass or thyromegaly.     Trachea: Trachea and phonation normal. No tracheal tenderness or tracheal deviation.  Cardiovascular:     Rate and Rhythm: Normal rate and regular rhythm.     Pulses: Normal pulses.          Radial pulses are 2+ on the right side and 2+ on the left side.     Heart sounds: Normal heart sounds, S1 normal and S2 normal.  Pulmonary:     Effort: Pulmonary effort is normal. No accessory muscle usage or respiratory distress.     Breath sounds: Normal breath sounds and air entry. No stridor. No decreased breath sounds, wheezing, rhonchi or rales.     Comments: Spoke full sentences in clinic; no cough observed in clinic Chest:     Chest wall: No tenderness.  Abdominal:     General: There is no distension.     Palpations: Abdomen is soft.  Musculoskeletal:        General: No swelling, tenderness, deformity or signs of injury. Normal range of  motion.     Right hand: Normal strength. Normal capillary refill.     Left hand: Normal strength. Normal capillary refill.     Cervical back: Normal range of motion and neck supple. No swelling, edema, deformity, erythema, signs of trauma, lacerations, rigidity, spasms, torticollis, tenderness or crepitus. No pain with movement, spinous process tenderness or muscular tenderness. Normal range of motion.     Thoracic back: No swelling, edema, deformity, signs of trauma, lacerations, spasms or tenderness. Normal range of motion.     Right lower leg: No edema.     Left lower leg: No edema.  Lymphadenopathy:     Head:     Right side of head: No submental, submandibular, tonsillar, preauricular, posterior auricular or occipital adenopathy.     Left side of head: No submental, submandibular, tonsillar, preauricular, posterior auricular or occipital adenopathy.     Cervical: No cervical adenopathy.     Right cervical: No superficial, deep or posterior  cervical adenopathy.    Left cervical: No superficial, deep or posterior cervical adenopathy.  Skin:    General: Skin is warm and dry.     Capillary Refill: Capillary refill takes less than 2 seconds.     Coloration: Skin is not ashen, cyanotic, jaundiced, mottled, pale or sallow.     Findings: No abrasion, abscess, acne, bruising, burn, ecchymosis, erythema, signs of injury, laceration, lesion, petechiae, rash or wound.     Nails: There is no clubbing.  Neurological:     General: No focal deficit present.     Mental Status: She is alert and oriented to person, place, and time. Mental status is at baseline. She is not disoriented.     GCS: GCS eye subscore is 4. GCS verbal subscore is 5. GCS motor subscore is 6.     Cranial Nerves: No cranial nerve deficit, dysarthria or facial asymmetry.     Sensory: Sensation is intact. No sensory deficit.     Motor: Motor function is intact. No weakness, tremor, atrophy, abnormal muscle tone or seizure activity.      Coordination: Coordination is intact. Coordination normal.     Gait: Gait is intact. Gait normal.     Comments: In/out of chair without difficulty; gait sure and steady in clinic; bilateral hand grasp equal 5/5  Psychiatric:        Attention and Perception: Attention and perception normal.        Mood and Affect: Mood and affect normal.        Speech: Speech normal.        Behavior: Behavior normal. Behavior is cooperative.        Thought Content: Thought content normal.        Cognition and Memory: Cognition and memory normal.        Judgment: Judgment normal.           Assessment & Plan:   A-acute right otitis media, oral candidiasis, cigarette nicotine dependence  P-Supportive treatment. Augmentin 875mg  po BID x 10 days #20 RF0 dispensed from [facility] to patient  Tylenol 1000mg  po QID prn pain/fever.   No evidence of invasive bacterial infection, non toxic and well hydrated.  I do not see where any further testing or imaging is necessary at this time.   I will suggest supportive care, rest, good hygiene and encourage the patient to take adequate fluids.  The patient is to return to clinic or EMERGENCY ROOM if symptoms worsen or change significantly e.g. ear pain, fever, purulent discharge from ears or bleeding.  Exitcare handout on otitis media   Patient verbalized agreement and understanding of treatment plan.     Electronic rx sent to her pharmacy of choice diflucan 150mg  po x 1 now and may repeat in 72 hours if symptoms persist #2 RF0.  Exitcare handout on candidiasis  Follow up re-evaluation if symptoms worsening or do not resolve after 2 diflucan doses.  Patient verbalized understanding information/instructions and had no further questions at this time.  Patient has nicotine gum.  Trying to cut down not ready to fully quit at this time.  Discussed smoking cessation.  Exitcare handout on quitting tips.  Patient verbalized understanding and had no further questions at this time.

## 2023-06-24 NOTE — Patient Instructions (Addendum)
Oral Thrush, Adult Oral thrush, also called oral candidiasis, is a fungal infection that develops in the mouth and throat and on the tongue. It causes white patches to form in the mouth and on the tongue. Many cases of thrush are mild, but this infection can also be serious. Rebecca Sweeney can be a repeated (recurrent) problem for certain people who have a weak body defense system (immune system). The weakness can be caused by chronic illnesses, or by taking medicines that limit the body's ability to fight infection. If a person has difficulty fighting infection, the fungus that causes thrush can spread through the body. This can cause life-threatening blood or organ infections. What are the causes? This condition is caused by a fungus (yeast) called Candida albicans. This fungus is normally present in small amounts in the mouth and on other mucous membranes. It usually causes no harm. If conditions are present that allow the fungus to grow without control, it invades surrounding tissues and becomes an infection. Other Candida species can also lead to thrush, though this is rare. What increases the risk? The following factors may make you more likely to develop this condition: Having a weakened immune system. Being an older adult. Having diabetes, cancer, or HIV (human immunodeficiency virus). Having dry mouth (xerostomia). Being pregnant or breastfeeding. Having poor dental care, especially in those who have dentures. Using antibiotic or steroid medicines. What are the signs or symptoms? Symptoms of this condition can vary from mild and moderate to severe and persistent. Symptoms may include: A burning feeling in the mouth and throat. This can occur at the start of a thrush infection. White patches that stick to the mouth and tongue. The tissue around the patches may be red, raw, and painful. If rubbed (during tooth brushing, for example), the patches and the tissue of the mouth may bleed easily. A bad  taste in the mouth or difficulty tasting foods. A cottony feeling in the mouth. Pain during eating and swallowing. Poor appetite. Cracking at the corners of the mouth. How is this diagnosed? This condition is diagnosed based on: A physical exam. Your medical history. How is this treated? This condition is treated with medicines called antifungals, which prevent the growth of fungi. These medicines are either applied directly to the affected area (topical) or swallowed (oral). The treatment will depend on the severity of the condition. Mild cases of thrush may be treated with an antifungal mouth rinse or lozenges. Treatment usually lasts about 14 days. Moderate to severe cases of thrush can be treated with oral antifungal medicine, if they have spread to the esophagus. A topical antifungal medicine may also be used. For some severe infections, treatment may need to continue for more than 14 days. Oral antifungal medicines are rarely used during pregnancy because they may be harmful to the unborn child. If you are pregnant, talk with your health care provider about options for treatment. Persistent or recurrent thrush. For cases of thrush that do not go away or keep coming back: Treatment may be needed twice as long as the symptoms last. Treatment will include both oral and topical antifungal medicines. People with a weakened immune system can take an antifungal medicine on a continuous basis to prevent thrush infections. It is important to treat conditions that make a person more likely to get thrush, such as diabetes or HIV. Follow these instructions at home: Relieving soreness and discomfort To help reduce the discomfort of thrush: Drink cold liquids such as water or iced tea.  Try flavored ice treats or frozen juices. Eat foods that are easy to swallow, such as gelatin, ice cream, or custard. Try drinking from a straw if the patches in your mouth are painful.  General instructions Take  or use over-the-counter and prescription medicines only as told by your health care provider. Eat plain, unflavored yogurt as directed by your health care provider. Check the label to make sure the yogurt contains live cultures. This yogurt can help healthy bacteria grow in the mouth and can stop the growth of the fungus that causes thrush. If you wear dentures, remove the dentures before going to bed, brush them vigorously, and soak them in a cleaning solution as directed by your health care provider. Rinse your mouth with a warm salt-water mixture several times a day. To make a salt-water mixture, dissolve -1 tsp (3-6 g) of salt in 1 cup (237 mL) of warm water. Contact a health care provider if: Your symptoms are getting worse or are not improving within 7 days of starting treatment. You have symptoms of a spreading infection, such as white patches on the skin outside of the mouth. You are breastfeeding your baby and you have redness and pain in the nipples. Summary Oral thrush, also called oral candidiasis, is a fungal infection that develops in the mouth and throat and on the tongue. It causes white patches to form in the mouth and on the tongue. You are more likely to get this condition if you have a weakened immune system or an underlying condition, such as HIV, cancer, or diabetes. This condition is treated with medicines called antifungals, which prevent the growth of fungi. Contact a health care provider if your symptoms do not improve, or get worse, within 7 days of starting treatment. This information is not intended to replace advice given to you by your health care provider. Make sure you discuss any questions you have with your health care provider. Document Revised: 07/29/2022 Document Reviewed: 07/29/2022 Elsevier Patient Education  2024 [employer] Healthy Living: Tips to Quit Tobacco There are many benefits of not using tobacco. You will learn tips you can use to stop using  tobacco. To view the content, go to this web address: https://pe.elsevier.[email or web address]  This video will expire on: 10/23/2024. If you need access to this video following this date, please reach out to the healthcare provider who assigned it to you. This information is not intended to replace advice given to you by your health care provider. Make sure you discuss any questions you have with your health care provider. Elsevier Patient Education  2024 [employer]. Otitis Media, Adult  Otitis media occurs when there is inflammation and fluid in the middle ear with signs and symptoms of an acute infection. The middle ear is a part of the ear that contains bones for hearing as well as air that helps send sounds to the brain. When infected fluid builds up in this space, it causes pressure and can lead to an ear infection. The eustachian tube connects the middle ear to the back of the nose (nasopharynx) and normally allows air into the middle ear. If the eustachian tube becomes blocked, fluid can build up and become infected. What are the causes? This condition is caused by a blockage in the eustachian tube. This can be caused by mucus or by swelling of the tube. Problems that can cause a blockage include: A cold or other upper respiratory infection. Allergies. An irritant, such as tobacco smoke. Enlarged adenoids. The  adenoids are areas of soft tissue located high in the back of the throat, behind the nose and the roof of the mouth. They are part of the body's defense system (immune system). A mass in the nasopharynx. Damage to the ear caused by pressure changes (barotrauma). What increases the risk? You are more likely to develop this condition if you: Smoke or are exposed to tobacco smoke. Have an opening in the roof of your mouth (cleft palate). Have gastroesophageal reflux. Have an immune system disorder. What are the signs or symptoms? Symptoms of this condition include: Ear  pain. Fever. Decreased hearing. Tiredness (lethargy). Fluid leaking from the ear, if the eardrum is ruptured or has burst. Ringing in the ear. How is this diagnosed?  This condition is diagnosed with a physical exam. During the exam, your health care provider will use an instrument called an otoscope to look in your ear and check for redness, swelling, and fluid. He or she will also ask about your symptoms. Your health care provider may also order tests, such as: A pneumatic otoscopy. This is a test to check the movement of the eardrum. It is done by squeezing a small amount of air into the ear. A tympanogram. This is a test that shows how well the eardrum moves in response to air pressure in the ear canal. It provides a graph for your health care provider to review. How is this treated? This condition can go away on its own within 3-5 days. But if the condition is caused by a bacterial infection and does not go away on its own, or if it keeps coming back, your health care provider may: Prescribe antibiotic medicine to treat the infection. Prescribe or recommend medicines to control pain. Follow these instructions at home: Take over-the-counter and prescription medicines only as told by your health care provider. If you were prescribed an antibiotic medicine, take it as told by your health care provider. Do not stop taking the antibiotic even if you start to feel better. Keep all follow-up visits. This is important. Contact a health care provider if: You have bleeding from your nose. There is a lump on your neck. You are not feeling better in 5 days. You feel worse instead of better. Get help right away if: You have severe pain that is not controlled with medicine. You have swelling, redness, or pain around your ear. You have stiffness in your neck. A part of your face is not moving (paralyzed). The bone behind your ear (mastoid bone) is tender when you touch it. You develop a severe  headache. Summary Otitis media is redness, soreness, and swelling of the middle ear, usually resulting in pain and decreased hearing. This condition can go away on its own within 3-5 days. If the problem does not go away in 3-5 days, your health care provider may give you medicines to treat the infection. If you were prescribed an antibiotic medicine, take it as told by your health care provider. Follow all instructions that were given to you by your health care provider. This information is not intended to replace advice given to you by your health care provider. Make sure you discuss any questions you have with your health care provider. Document Revised: 11/20/2020 Document Reviewed: 11/20/2020 Elsevier Patient Education  2024 [employer].

## 2023-07-29 ENCOUNTER — Encounter: Payer: Self-pay | Admitting: Registered Nurse

## 2023-07-29 ENCOUNTER — Telehealth: Payer: Self-pay | Admitting: Registered Nurse

## 2023-07-29 DIAGNOSIS — R12 Heartburn: Secondary | ICD-10-CM

## 2023-07-29 MED ORDER — OMEPRAZOLE 20 MG PO CPDR: 20.0000 mg | DELAYED_RELEASE_CAPSULE | Freq: Every day | ORAL | 0 refills | Status: DC

## 2023-07-29 NOTE — Telephone Encounter (Signed)
Patient last filled omeprazole DR 20mg  Oct 2022 90 tabs.  Takes prn heartburn flare.  Since son had motorcycle accident and broke leg this fall heartburn has been worse along with she has been taking more ibuprofen for aches and pains this fall.  Increased stressors at work she thinks all contributing to her flare up of heartburn.  Not taking every day.  Patient planning for retirement age 62 after birthday April 2024  May take tums/calcium carbonate po prn heartburn for fast acting relief.  Consider taking omeprazole every day she takes ibuprofen and ensure eating food.  Patient verbalized understanding information/instructions, agreed with plan of care and had no further questions at this time.

## 2023-08-05 ENCOUNTER — Encounter: Payer: Self-pay | Admitting: Registered Nurse

## 2023-08-05 ENCOUNTER — Telehealth: Payer: Self-pay | Admitting: Registered Nurse

## 2023-08-05 DIAGNOSIS — E559 Vitamin D deficiency, unspecified: Secondary | ICD-10-CM

## 2023-08-05 DIAGNOSIS — E7800 Pure hypercholesterolemia, unspecified: Secondary | ICD-10-CM

## 2023-08-05 DIAGNOSIS — Z0000 Encounter for general adult medical examination without abnormal findings: Secondary | ICD-10-CM

## 2023-08-05 MED ORDER — ATORVASTATIN CALCIUM 20 MG PO TABS: 20.0000 mg | ORAL_TABLET | Freq: Every day | ORAL | Status: DC

## 2023-08-05 NOTE — Telephone Encounter (Signed)
Patient last filled atorvastatin 20mg  po daily 11/19/22 last dose approximately June and had not refilled was on FMLA to care for son who had M[place] in [place].  Had labs with PCM unable to open her my chart to review and wanted to discuss results with me in clinic today  Epic care everywhere reviewed discussed thyroid, electrolytes, blood sugar spot, liver/kidney function normal.  Total cholesterol LDL very elevated off statin recommend restarting and recheck level in 3 to 6 months  Patient also stated lab was nonfasting as appt was 4pm and she had to work all day prior.  Mild anemia low RBC on CBC.  Patient stated hasn't been sleeping well a couple hours per night 3-4 as worrying about son leg fracture not healing needs another surgery soon.  Taking 6000 units vitamin D level low normal.  Reminded patient to take daily with meal could increase to 8000 units for the winter.  Patient scheduled for repeat lipid level in March along with Hgba1c for [facility] labs.  Patient agreed with plan of care and had no further questions at this time.  Dispensed 90 tabs atorvastatin 20mg  from [facility] to patient today.  Fasting labs 0800 10/1023 with RN scheduled  Component Ref Range & Units 6 d ago  Glucose 70 - 99 mg/dL 92  BUN 8 - 27 mg/dL 14  Creatinine 6.04 - 5.40 mg/dL 9.81  eGFR >19 JY/NWG/9.56 99  BUN/Creatinine Ratio 12 - 28 21  Sodium 134 - 144 mmol/L 139  Potassium 3.5 - 5.2 mmol/L 3.9  Chloride 96 - 106 mmol/L 100  CO2 20 - 29 mmol/L 22  CALCIUM 8.7 - 10.3 mg/dL 9.9  Total Protein 6.0 - 8.5 g/dL 6.9  Albumin, Serum 3.9 - 4.9 g/dL 4.6  Globulin, Total 1.5 - 4.5 g/dL 2.3  Total Bilirubin 0.0 - 1.2 mg/dL 0.3  Alkaline Phosphatase 44 - 121 IU/L 65  AST 0 - 40 IU/L 22  ALT (SGPT) 0 - 32 IU/L 31  Resulting Agency [facility]  Narrative Performed by [employer] Performed at:  [address] [facility] [address] [place], [place]  [id number] Lab Director: Jolene Schimke MD, Phone:   [phone] Specimen Collected: 07/30/23 16:57   Performed by: Verdell Carmine Last Resulted: 07/31/23 04:35  Received From: [facility]  Result Received: 08/05/23 11:21   Component Ref Range & Units 6 d ago Comments  Cholesterol, Total 100 - 199 mg/dL 132 High    Triglycerides 0 - 149 mg/dL 440 High    HDL >10 mg/dL 52   VLDL Cholesterol Cal 5 - 40 mg/dL 38   LDL 0 - 99 mg/dL 272 High    LDL Calc Comment Comment Consider evaluating for Familial Hypercholesterolemia(FH), if clinically indicated.  LDL/HDL Ratio 0.0 - 3.2 ratio 4.5 High                                      LDL/HDL Ratio                                             Men  Women                               1/2 Avg.Risk  1.0  1.5                                   Avg.Risk  3.6    3.2                                2X Avg.Risk  6.2    5.0                                3X Avg.Risk  8.0    6.1  Resulting Agency [facility]   Narrative Performed by [employer] Performed at:  [address] [facility] [address], [place], [place]  [id number] Lab Director: Jolene Schimke MD, Phone:  [phone] Specimen Collected: 07/30/23 16:57   Performed by: Verdell Carmine Last Resulted: 07/31/23 04:35  Received From: [facility]  Result Received: 08/05/23 11:21    Component Ref Range & Units 6 d ago  TSH 0.450 - 4.50 uIU/mL 0.776  Resulting Agency [facility]  Narrative Performed by Verdell Carmine Performed at:  [address] [facility] [address] [place], [place]  [id number] Lab Director: Jolene Schimke MD, Phone:  [phone] Specimen Collected: 07/30/23 16:57   Performed by: Verdell Carmine Last Resulted: 07/31/23 04:35  Received From: [facility]  Result Received: 08/05/23 11:21   Component Ref Range & Units 6 d ago Comments  Vit D, 25-Hydroxy 30.0 - 100.0 ng/mL 30.4 Vitamin D deficiency has been defined by the Institute of Medicine and an Endocrine Society practice guideline as a level of serum 25-OH vitamin D less than 20 ng/mL (1,2). The  Endocrine Society went on to further define vitamin D insufficiency as a level between 21 and 29 ng/mL (2). 1. IOM (Institute of Medicine). 2010. Dietary reference    intakes for calcium and D. [place]: The    [employer]. 2. Holick MF, Binkley Amaya, Bischoff-Ferrari HA, et al.    Evaluation, treatment, and prevention of vitamin D    deficiency: an Endocrine Society clinical practice    guideline. JCEM. 2011 Jul; 96(7):1911-30.  Resulting Agency [facility]   Narrative Performed by [employer] Performed at:  [address] [facility]  [address] [place], [place]  [id number] Lab Director: Jolene Schimke MD, Phone:  [phone] Specimen Collected: 07/30/23 16:57   Performed by: Verdell Carmine Last Resulted: 07/31/23 04:35  Received From: [facility]  Result Received: 08/05/23 11:21   Component Ref Range & Units 6 d ago  WBC 3.7 - 11.0 thou/mcL 9.8  RBC 4.01 - 4.90 million/mcL 3.83 Low   HGB 12.2 - 14.9 gm/dL 63.0  HCT 16.0 - 10.9 % 36.9  MCV 82.0 - 98.0 fL 96.2  MCH 27.0 - 33.0 pg 34.5 High   MCHC 31.0 - 37.0 gm/dL 32.3  Plt Ct 557 - 322 thou/mcL 386  RDW CV 11.8 - 14.9 % 13.7  NEUTROPHIL % % 57.6  LYMPHOCYTE % % 32.9  MID% % 9.5  ABSOLUTE NEUTROPHIL COUNT 1.50 - 7.50 thou/mcL 5.70  ABSOLUTE LYMPHOCYTE COUNT 1.00 - 4.50 thou/mcL 3.20  ABSOLUTE MID 0.1 - 1.5 thou/mcL 0.9  Resulting Agency [facility]  Specimen Collected: 07/30/23 16:56   Performed by: [facility] Last Resulted: 07/30/23 17:14  Received From: [facility]  Result Received: 08/05/23 11:21

## 2023-08-26 ENCOUNTER — Telehealth: Payer: Self-pay | Admitting: Registered Nurse

## 2023-08-26 DIAGNOSIS — F17219 Nicotine dependence, cigarettes, with unspecified nicotine-induced disorders: Secondary | ICD-10-CM

## 2023-08-26 DIAGNOSIS — R911 Solitary pulmonary nodule: Secondary | ICD-10-CM

## 2023-08-26 DIAGNOSIS — I2510 Atherosclerotic heart disease of native coronary artery without angina pectoris: Secondary | ICD-10-CM

## 2023-08-26 DIAGNOSIS — J439 Emphysema, unspecified: Secondary | ICD-10-CM

## 2023-08-26 DIAGNOSIS — E782 Mixed hyperlipidemia: Secondary | ICD-10-CM

## 2023-08-27 DIAGNOSIS — J439 Emphysema, unspecified: Secondary | ICD-10-CM | POA: Insufficient documentation

## 2023-08-27 DIAGNOSIS — R911 Solitary pulmonary nodule: Secondary | ICD-10-CM | POA: Insufficient documentation

## 2023-08-27 DIAGNOSIS — F17219 Nicotine dependence, cigarettes, with unspecified nicotine-induced disorders: Secondary | ICD-10-CM | POA: Insufficient documentation

## 2023-08-27 DIAGNOSIS — I2510 Atherosclerotic heart disease of native coronary artery without angina pectoris: Secondary | ICD-10-CM | POA: Insufficient documentation

## 2023-08-27 NOTE — Telephone Encounter (Signed)
 Patient concerned unable to see her chest CT results in my chart asked for me to review record  IMPRESSION: Nodule left major fissure measuring 4.5 mm.  RECOMMENDATIONS: Continue annual Screening low-dose chest CT (exam code [id number]).  ASSESSMENT: Lung-RADSTM CATEGORY: 2-Benign appearance or behavior  Smoking cessation resources: 1-800-QUIT-NOW and smokefree.gov  Electronically Signed by: Alyce Coma, MD on 08/21/2023 11:28 AM Narrative  TECHNIQUE:  Noncontrast screening protocol chest CT was performed. Radiation dose reduction was utilized (automated exposure control, mA or kV adjustment based on patient size, or iterative image reconstruction). 3-D maximal intensity projection images were constructed and reviewed, along with axial and 2-dimensional sagittal reformatted images.  COMPARISON:  None  INDICATION: Lung cancer screening  Please note that in cases with numerous small nodules only the largest or most suspicious may be detailed.  FINDINGS:  Lungs and pleura: Patent central airways. No pleural effusion or pneumothorax. Emphysema.  Mediastinum/Soft Tissues: LAD and RCA coronary calcifications. No adenopathy.  Upper Abdomen: Grossly unremarkable allowing for low dose technique.  Bones: No acute or aggressive bony abnormality.  Nodule assessment: 1.  5 x 4 mm nodule seen associated with left major fissure axial image #453, series 4.  Discussed findings with patient unable to tell at this time if benign or malignant lesion.  I do recommend stop smoking as she is still smoking and has had emphysema on imaging since June 2009 .  Clinical Data: Chest pain    CHEST - 2 VIEW    Comparison: None    Findings: Heart size and mediastinal contours are unremarkable.    There is no pleural effusion or pulmonary interstitial edema.    No airspace opacities are identified.    Interstitial changes suggestive of emphysema are noted.    IMPRESSION:    1. No active  cardiopulmonary disease.  2.  Query mild emphysema.   Provider: Mitzie Redo, Arnaldo Collet    This CT finding could be related to old illness.  Recommend follow up with [facility] and further imaging or pulmonology consult.  Discussed with patient common for further imaging to see if any enlargement over time and to notify PCM if new chest/breathing symptoms or enlarged lymph nodes.  Discussed with patient also calcifications seen in heart blood vessals LAD and RCA ensure she is taking her atorvastatin  every day she recently filled from [facility].  She had run out/stopped use prior to fill/labs done at [facility] earlier in December off statin.  Patient verbalized understanding of information that unable to determine from report if cancer/not cancer and needs further follow up with PCM. Smoking cessation recommended and she will consider stopping. She stated she would follow up with PCM.  Component Ref Range & Units 4 wk ago  Glucose 70 - 99 mg/dL 92  BUN 8 - 27 mg/dL 14  Creatinine 9.42 - 8.99 mg/dL 9.32  eGFR >40 fO/fpw/8.26 99  BUN/Creatinine Ratio 12 - 28 21  Sodium 134 - 144 mmol/L 139  Potassium 3.5 - 5.2 mmol/L 3.9  Chloride 96 - 106 mmol/L 100  CO2 20 - 29 mmol/L 22  CALCIUM  8.7 - 10.3 mg/dL 9.9  Total Protein 6.0 - 8.5 g/dL 6.9  Albumin, Serum 3.9 - 4.9 g/dL 4.6  Globulin, Total 1.5 - 4.5 g/dL 2.3  Total Bilirubin 0.0 - 1.2 mg/dL 0.3  Alkaline Phosphatase 44 - 121 IU/L 65  AST 0 - 40 IU/L 22  ALT (SGPT) 0 - 32 IU/L 31  Resulting Agency [facility]  Narrative Performed by  [facility] Performed at:  [address] [place], [place]  [id number] Lab Director: Frankey Sas MD, Phone:  [phone] Specimen Collected: 07/30/23 16:57   Performed by: HO[facility] Last Resulted: 07/31/23 04:35  Received From: [facility]  Result Received: 08/05/23 11:21   Component Ref Range & Units 4 wk ago Comments  Cholesterol, Total 100 - 199 mg/dL 673 High    Triglycerides 0  - 149 mg/dL 807 High    HDL >60 mg/dL 52   VLDL Cholesterol Cal 5 - 40 mg/dL 38   LDL 0 - 99 mg/dL 763 High    LDL Calc Comment Comment Consider evaluating for Familial Hypercholesterolemia(FH), if clinically indicated.  LDL/HDL Ratio 0.0 - 3.2 ratio 4.5 High                                      LDL/HDL Ratio                                             Men  Women                               1/2 Avg.Risk  1.0    1.5                                   Avg.Risk  3.6    3.2                                2X Avg.Risk  6.2    5.0                                3X Avg.Risk  8.0    6.1  Resulting Agency [facility]   Narrative Performed by [employer] Performed at:  [address] [facility] [address], [place], [place]  [id number] Lab Director: Frankey Sas MD, Phone:  [phone] Specimen Collected: 07/30/23 16:57   Performed by: HOYT Last Resulted: 07/31/23 04:35  Received From: [facility]  Result Received: 08/05/23 11:21   Component Ref Range & Units 4 wk ago  WBC 3.7 - 11.0 thou/mcL 9.8  RBC 4.01 - 4.90 million/mcL 3.83 Low   HGB 12.2 - 14.9 gm/dL 86.7  HCT 64.1 - 52.0 % 36.9  MCV 82.0 - 98.0 fL 96.2  MCH 27.0 - 33.0 pg 34.5 High   MCHC 31.0 - 37.0 gm/dL 64.0  Plt Ct 849 - 599 thou/mcL 386  RDW CV 11.8 - 14.9 % 13.7  NEUTROPHIL % % 57.6  LYMPHOCYTE % % 32.9  MID% % 9.5  ABSOLUTE NEUTROPHIL COUNT 1.50 - 7.50 thou/mcL 5.70  ABSOLUTE LYMPHOCYTE COUNT 1.00 - 4.50 thou/mcL 3.20  ABSOLUTE MID 0.1 - 1.5 thou/mcL 0.9  Resulting Agency [facility]  Specimen Collected: 07/30/23 16:56   Performed by: [facility] Last Resulted: 07/30/23 17:14  Received From: [facility]  Result Received: 08/05/23 11:21    cimen: Blood Component Ref Range & Units 4 wk ago  TSH 0.450 - 4.50 uIU/mL  0.776  Resulting Agency [facility]  Narrative Performed by [employer] Performed at:  [address] [facility] [address] [place], [place]   [id number] Lab Director: Frankey Sas MD, Phone:  [phone] Specimen Collected: 07/30/23 16:57   Performed by: HO[facility] Last Resulted: 07/31/23 04:35  Received From: [facility]  Result Received: 08/05/23 11:21    Component Ref Range & Units 4 wk ago Comments  Vit D, 25-Hydroxy 30.0 - 100.0 ng/mL 30.4 Vitamin D  deficiency has been defined by the Institute of Medicine and an Endocrine Society practice guideline as a level of serum 25-OH vitamin D  less than 20 ng/mL (1,2). The Endocrine Society went on to further define vitamin D  insufficiency as a level between 21 and 29 ng/mL (2). 1. IOM (Institute of Medicine). 2010. Dietary reference    intakes for calcium  and D. [place]: The    [employer]. 2. Holick MF, Binkley [place], Bischoff-Ferrari HA, et al.    Evaluation, treatment, and prevention of vitamin D     deficiency: an Endocrine Society clinical practice    guideline. JCEM. 2011 Jul; 96(7):1911-30.  Resulting Agency [facility]   Narrative Performed by [employer] Performed at:  [address] [facility] [address] [place], [place]  [id number] Lab Director: Frankey Sas MD, Phone:  [phone] Specimen Collected: 07/30/23 16:57   Performed by: HOYT Last Resulted: 07/31/23 04:35  Received From: [facility]  Result Received: 08/05/23 11:21

## 2023-09-25 ENCOUNTER — Telehealth: Payer: Self-pay | Admitting: Registered Nurse

## 2023-09-25 DIAGNOSIS — J019 Acute sinusitis, unspecified: Secondary | ICD-10-CM

## 2023-09-25 DIAGNOSIS — J302 Other seasonal allergic rhinitis: Secondary | ICD-10-CM

## 2023-09-25 MED ORDER — MECLIZINE HCL 25 MG PO TABS: 25.0000 mg | ORAL_TABLET | Freq: Four times a day (QID) | ORAL | 0 refills | Status: AC | PRN

## 2023-09-25 NOTE — Telephone Encounter (Signed)
Patient using nose sprays and antihistamine and singulair and not getting any relief from sinus pressure.  Using nasal saline daily.  Taking all pills in am.  Discussed with patient take loratadine in am and singulair 10mg  at bedtime.  Increase frequency of use nasal saline 2 sprays each nostril q2h wa.  Continue azelastine 2 sprays each nostril BID and fluticasone nasal 1 spray each nostril BID but separate take one at wake up and bed time and other mid day and after supper.  Use nasal saline first otherwise just washing out medication from sinuses/nose.  Follow up for re-evaluation if worsening and no improvement with altering medication administration times.  Discussed can also stop loratadine and start meclizine 25mg  po q6h if works better electronic Rx sent to her pharmacy of choice #30 RF0  Patient agreed with plan of care and had no further questions at this time.  Mucous clear most pressure left maxillary sinus.  Nasal sniffing observed in clinic.

## 2023-10-01 ENCOUNTER — Encounter: Payer: Self-pay | Admitting: Gastroenterology

## 2023-10-10 ENCOUNTER — Ambulatory Visit (AMBULATORY_SURGERY_CENTER): Payer: No Typology Code available for payment source

## 2023-10-10 VITALS — Ht 66.0 in | Wt 156.0 lb

## 2023-10-10 DIAGNOSIS — Z1211 Encounter for screening for malignant neoplasm of colon: Secondary | ICD-10-CM

## 2023-10-10 MED ORDER — SUFLAVE 178.7 G PO SOLR: 1.0000 | Freq: Once | ORAL | 0 refills | Status: AC

## 2023-10-10 NOTE — Progress Notes (Signed)

## 2023-10-14 ENCOUNTER — Ambulatory Visit: Payer: Self-pay | Admitting: Registered Nurse

## 2023-10-14 ENCOUNTER — Encounter: Payer: Self-pay | Admitting: Registered Nurse

## 2023-10-14 VITALS — BP 115/79 | HR 75 | Temp 97.9°F | Resp 16

## 2023-10-14 DIAGNOSIS — M62838 Other muscle spasm: Secondary | ICD-10-CM

## 2023-10-14 DIAGNOSIS — M542 Cervicalgia: Secondary | ICD-10-CM

## 2023-10-14 MED ORDER — ACETAMINOPHEN 500 MG PO TABS: 1000.0000 mg | ORAL_TABLET | Freq: Four times a day (QID) | ORAL | Status: AC | PRN

## 2023-10-14 MED ORDER — MELOXICAM 15 MG PO TABS: 15.0000 mg | ORAL_TABLET | Freq: Every day | ORAL | 1 refills | Status: DC | PRN

## 2023-10-14 MED ORDER — CYCLOBENZAPRINE HCL 10 MG PO TABS: 5.0000 mg | ORAL_TABLET | Freq: Three times a day (TID) | ORAL | 0 refills | Status: DC | PRN

## 2023-10-14 MED ORDER — BIOFREEZE COOL THE PAIN 4 % EX GEL: 1.0000 | Freq: Four times a day (QID) | CUTANEOUS | Status: AC | PRN

## 2023-10-14 NOTE — Progress Notes (Signed)
Subjective:    Patient ID: Rebecca Sweeney, female    DOB: 10-05-1960, 63 y.o.   MRN: [id number]  62y/o established caucasian female here for evaluation neck pain that started yesterday at work.  Thought initially she had just slept wrong but still bothering her today.  Yesterday worst pain with flexion and left rotation. Has tried biofreeze, ibuprofen, massage chair at home, heat, ice and she thinks massage chair helped the most overnight.  Denied loss of bowel/bladder control, saddle paresthesias or arm/leg weakness.  Today can rotate to the left a little bit more but cervical flexion still painful.  Denied known trauma/injury/rash/fever/swelling.  Headache today none yesterday.  Denied visual changes, ear pain, n/v/d/f/c/neck stiffness.  Denied problems hand grasp/opening doors/bottles.  Denied need for work restrictions.  Did not notify supervisor yesterday when pain started at work.  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     Review of Systems  Constitutional:  Positive for fatigue. Negative for chills, diaphoresis and fever.  HENT:  Negative for congestion, trouble swallowing and voice change.   Eyes:  Negative for photophobia and visual disturbance.  Respiratory:  Negative for cough, shortness of breath, wheezing and stridor.   Cardiovascular:  Negative for leg swelling.  Gastrointestinal:  Negative for diarrhea, nausea and vomiting.  Genitourinary:  Negative for difficulty urinating and enuresis.  Musculoskeletal:  Positive for myalgias and neck pain. Negative for arthralgias, back pain, gait problem, joint swelling and neck stiffness.  Skin:  Negative for color change, pallor, rash and wound.  Neurological:  Positive for headaches. Negative for dizziness, tremors, seizures, syncope, facial asymmetry, speech difficulty, weakness,  light-headedness and numbness.  Hematological:  Negative for adenopathy. Does not bruise/bleed easily.  Psychiatric/Behavioral:  Positive for sleep disturbance. Negative for agitation and confusion.        Objective:   Physical Exam Vitals reviewed.  Constitutional:      General: She is awake. She is not in acute distress.    Appearance: Normal appearance. She is well-developed, well-groomed and normal weight. She is not ill-appearing, toxic-appearing or diaphoretic.  HENT:     Head: Normocephalic and atraumatic.     Jaw: There is normal jaw occlusion. No trismus, tenderness or pain on movement.     Salivary Glands: Right salivary gland is not diffusely enlarged or tender. Left salivary gland is not diffusely enlarged or tender.     Right Ear: Hearing and external ear normal. No decreased hearing noted. No laceration, drainage, swelling or tenderness.     Left Ear: Hearing and external ear normal. No decreased hearing noted. No laceration, drainage, swelling or tenderness.     Nose: Nose normal. No congestion or rhinorrhea.     Right Nostril: No epistaxis.     Left Nostril: No epistaxis.     Right Turbinates: Not enlarged, swollen or pale.     Left Turbinates: Not enlarged, swollen or pale.     Right Sinus: No maxillary sinus tenderness or frontal sinus tenderness.     Left Sinus: No maxillary sinus tenderness or frontal sinus tenderness.     Mouth/Throat:     Lips: Pink. No lesions.     Mouth: Mucous membranes are moist. No oral lesions or angioedema.     Dentition: No gum lesions.     Tongue: No lesions. Tongue does not deviate from midline.  Palate: No mass and lesions.     Pharynx: Oropharynx is clear. Uvula midline.     Tonsils: No tonsillar exudate or tonsillar abscesses.  Eyes:     General: Lids are normal. Vision grossly intact. Gaze aligned appropriately. Allergic shiner present. No scleral icterus.       Right eye: No discharge.        Left eye: No discharge.      Extraocular Movements: Extraocular movements intact.     Conjunctiva/sclera: Conjunctivae normal.     Pupils: Pupils are equal, round, and reactive to light.  Neck:     Thyroid: No thyroid mass or thyromegaly.     Trachea: Trachea and phonation normal.     Comments: Pain with cervical flexion, extension, left rotation greater than right rotation and bilateral lateral bending; cervical paraspinals C6-7 left tighter than right along with bilateral trapezius muscles Cardiovascular:     Rate and Rhythm: Normal rate and regular rhythm.     Pulses: Normal pulses.          Radial pulses are 2+ on the right side and 2+ on the left side.     Heart sounds: Normal heart sounds, S1 normal and S2 normal.  Pulmonary:     Effort: Pulmonary effort is normal. No respiratory distress.     Breath sounds: Normal breath sounds and air entry. No stridor or transmitted upper airway sounds. No decreased breath sounds, wheezing, rhonchi or rales.     Comments: Spoke full sentences without difficulty; no cough observed in exam room Abdominal:     General: Abdomen is flat.     Palpations: Abdomen is soft.  Musculoskeletal:        General: Tenderness present. No swelling or deformity.     Right shoulder: No swelling, deformity, effusion, laceration, tenderness or crepitus. Normal strength.     Left shoulder: No swelling, deformity, effusion, laceration, tenderness or crepitus. Normal strength.     Right upper arm: No swelling, edema, deformity, lacerations or tenderness.     Left upper arm: No swelling, edema, deformity, lacerations or tenderness.     Right elbow: No swelling, deformity, effusion or lacerations. Normal range of motion.     Left elbow: No swelling, deformity, effusion or lacerations. Normal range of motion.     Right forearm: No swelling, edema, deformity, lacerations or tenderness.     Left forearm: No swelling, edema, deformity, lacerations or tenderness.     Right hand: Normal strength. Normal  capillary refill.     Left hand: Normal strength. Normal capillary refill.     Cervical back: Neck supple. Spasms and tenderness present. No swelling, edema, deformity, erythema, signs of trauma, lacerations, rigidity, torticollis, bony tenderness or crepitus. Pain with movement and muscular tenderness present. No spinous process tenderness. Decreased range of motion.     Thoracic back: No swelling, edema, deformity, signs of trauma, lacerations, spasms, tenderness or bony tenderness. Normal range of motion.     Lumbar back: No swelling, edema, deformity, signs of trauma, lacerations, spasms, tenderness or bony tenderness. Normal range of motion. No scoliosis.     Right lower leg: No edema.     Left lower leg: No edema.  Lymphadenopathy:     Head:     Right side of head: No submental, submandibular, tonsillar, preauricular, posterior auricular or occipital adenopathy.     Left side of head: No submental, submandibular, tonsillar, preauricular, posterior auricular or occipital adenopathy.     Cervical: No cervical adenopathy.  Right cervical: No superficial, deep or posterior cervical adenopathy.    Left cervical: No superficial, deep or posterior cervical adenopathy.  Skin:    General: Skin is warm and dry.     Capillary Refill: Capillary refill takes less than 2 seconds.     Coloration: Skin is not ashen, cyanotic, jaundiced, mottled, pale or sallow.     Findings: No abrasion, abscess, acne, bruising, burn, ecchymosis, erythema, signs of injury, laceration, lesion, petechiae, rash or wound.     Nails: There is no clubbing.     Comments: Face/neck/hands/arms/visually inspected  Neurological:     General: No focal deficit present.     Mental Status: She is alert and oriented to person, place, and time. Mental status is at baseline.     GCS: GCS eye subscore is 4. GCS verbal subscore is 5. GCS motor subscore is 6.     Cranial Nerves: No cranial nerve deficit, dysarthria or facial asymmetry.      Sensory: Sensation is intact.     Motor: Motor function is intact. No weakness, tremor, atrophy, abnormal muscle tone or seizure activity.     Coordination: Coordination is intact. Coordination normal.     Gait: Gait is intact. Gait normal.     Deep Tendon Reflexes:     Reflex Scores:      Brachioradialis reflexes are 1+ on the right side and 1+ on the left side.      Patellar reflexes are 1+ on the right side and 1+ on the left side.    Comments: In/out of chair without difficulty; gait sure and steady in clinic; bilateral hand grasp equal 5/5  Psychiatric:        Attention and Perception: Attention and perception normal.        Mood and Affect: Mood and affect normal.        Speech: Speech normal.        Behavior: Behavior normal. Behavior is cooperative.        Thought Content: Thought content normal.        Cognition and Memory: Cognition and memory normal.        Judgment: Judgment normal.           Assessment & Plan:  A-muscle spasms of neck/acute neck pain initial visit  P-electronic Rx to her pharmacy of choice/out of stock [facility] onsite.  cyclobenazeprine/flexeril 10mg  sig t1/2-1 po TID prn muscle spasms #30 RF0 dispensed from [facility].  Refilled mobic 15mg  po daily #90 RF0 has worked well for patient in the past chronic pain prefers over Ibuprofen 800mg  electronic Rx to her pharmacy of choice.  Avoid alcohol intake and driving while taking cyclobenazeprine/flexeril as drowsiness common side effect.  Slow position changes as medication also lower blood pressure.  Home stretches demonstrated to patient-e.g. Arm circles, walking up wall, chest stretches, neck AROM, chin tucks, knee to chest and rock side to side on back. Self massage or professional prn, foam roller use or tennis/racquetball.  Heat/cryotherapy 15 minutes QID prn.  Trial thermacare 1 applied and another given to patient for use tomorrow from clinic stock.  Biofreeze gel given 4 UD from clinic stock.  Tylenol 1000mg  po  q6h prn pain given 4 UD from clinic stock to use until she can pick up her mobic.  Discussed hold all voltaren/diclofenac/ibuprofen/motrin/advil/aleve/naproxen/naprosyn when taking mobic/meloxicam  Avoid dehydration and take with food.  Consider physical therapy referral if no improvement with prescribed therapy from [facility] and/or chiropractic care.  Ensure ergonomics correct  desk at work avoid repetitive motions if possible/holding phone/laptop in hand use desk/stand and/or break up lifting items into smaller loads/weights.  Patient was instructed to rest, ice, and ROM exercises.  Activity as tolerated.   Follow up if symptoms persist or worsen especially if loss of bowel/bladder control, arm/leg weakness and/or saddle paresthesias same day with a provider in person.  Exitcare handout on muscle spasms and cervical strain rehab exercises given to patient.  Discussed if work restrictions needed will have to schedule with [facility].  Notify your supervisor neck pain started at work yesterday.  HR/safety office notified patient seen in [facility] and refused [facility] appt today.  Notified HR/safety per patient no known trauma.  Due to contract limitations I am unable to write work restrictions for patient in this clinic  Patient verbalized agreement and understanding of treatment plan and had no further questions at this time.  P2:  Injury Prevention and Fitness.

## 2023-10-14 NOTE — Patient Instructions (Signed)
Cervical Strain and Sprain Rehab Ask your health care provider which exercises are safe for you. Do exercises exactly as told by your health care provider and adjust them as directed. It is normal to feel mild stretching, pulling, tightness, or discomfort as you do these exercises. Stop right away if you feel sudden pain or your pain gets worse. Do not begin these exercises until told by your health care provider. Stretching and range-of-motion exercises Cervical side bending  Using good posture, sit on a stable chair or stand up. Without moving your shoulders, slowly tilt your left / right ear to your shoulder until you feel a stretch in the neck muscles on the opposite side. You should be looking straight ahead. Hold for ____30______ seconds. Repeat with the other side of your neck. Repeat _____3_____ times. Complete this exercise ______2____ times a day. Cervical rotation  Using good posture, sit on a stable chair or stand up. Slowly turn your head to the side as if you are looking over your left / right shoulder. Keep your eyes level with the ground. Stop when you feel a stretch along the side and the back of your neck. Hold for _____30_____ seconds. Repeat this by turning to your other side. Repeat _____3_____ times. Complete this exercise _______2___ times a day. Thoracic extension and pectoral stretch  Roll a towel or a small blanket so it is about 4 inches (10 cm) in diameter. Lie down on your back on a firm surface. Put the towel in the middle of your back across your spine. It should not be under your shoulder blades. Put your hands behind your head and let your elbows fall out to your sides. Hold for ____30______ seconds. Repeat _____3_____ times. Complete this exercise _____2_____ times a day. Strengthening exercises Upper cervical flexion  Lie on your back with a thin pillow behind your head or a small, rolled-up towel under your neck. Gently tuck your chin toward your  chest and nod your head down to look toward your feet. Do not lift your head off the pillow. Hold for ___30_______ seconds. Release the tension slowly. Relax your neck muscles completely before you repeat this exercise. Repeat ______3____ times. Complete this exercise ______2____ times a day. Cervical extension  Stand about 6 inches (15 cm) away from a wall, with your back facing the wall. Place a soft object, about 6-8 inches (15-20 cm) in diameter, between the back of your head and the wall. A soft object could be a small pillow, a ball, or a folded towel. Gently tilt your head back and press into the soft object. Keep your jaw and forehead relaxed. Hold for ____30______ seconds. Release the tension slowly. Relax your neck muscles completely before you repeat this exercise. Repeat ______3____ times. Complete this exercise ____2______ times a day. Posture and body mechanics Body mechanics refer to the movements and positions of your body while you do your daily activities. Posture is part of body mechanics. Good posture and healthy body mechanics can help to relieve stress in your body's tissues and joints. Good posture means that your spine is in its natural S-curve position (your spine is neutral), your shoulders are pulled back slightly, and your head is not tipped forward. The following are general guidelines for using improved posture and body mechanics in your everyday activities. Sitting  When sitting, keep your spine neutral and keep your feet flat on the floor. Use a footrest, if needed, and keep your thighs parallel to the floor. Avoid rounding  your shoulders. Avoid tilting your head forward. When working at a desk or a computer, keep your desk at a height where your hands are slightly lower than your elbows. Slide your chair under your desk so you are close enough to maintain good posture. When working at a computer, place your monitor at a height where you are looking straight ahead  and you do not have to tilt your head forward or downward to look at the screen. Standing  When standing, keep your spine neutral and keep your feet about hip-width apart. Keep a slight bend in your knees. Your ears, shoulders, and hips should line up. When you do a task in which you stand in one place for a long time, place one foot up on a stable object that is 2-4 inches (5-10 cm) high, such as a footstool. This helps keep your spine neutral. Resting When lying down and resting, avoid positions that are most painful for you. Try to support your neck in a neutral position. You can use a contour pillow or a small rolled-up towel. Your pillow should support your neck but not push on it. This information is not intended to replace advice given to you by your health care provider. Make sure you discuss any questions you have with your health care provider. Document Revised: 12/16/2022 Document Reviewed: 03/04/2022 Elsevier Patient Education  2024 [employer]Muscle Cramps and Spasms Muscle cramps and spasms occur when a muscle or muscles tighten and you have no control over this tightening (involuntary muscle contraction). They are a common problem that can happen in any muscle. The most common place is in the calf muscles of the leg. There are a few ways that muscle cramps and spasms differ: Muscle cramps are painful. They come and go and may last for a few seconds or up to 15 minutes. Muscle cramps are often more forceful and last longer than muscle spasms. Muscle spasms may or may not be painful. They may last just a few seconds or last much longer. Certain conditions, such as diabetes or Parkinson's disease, can make you more likely to have cramps or spasms. But in most cases, cramps and spasms are not caused by other conditions. Common causes include: Overexertion. This is when you do more physical work or exercise than your body is ready for. Overuse from doing the same movements too many  times. Staying in one position for too long. Improper preparation, form, or technique when playing a sport or doing an activity. Not enough water or other fluids in your body (dehydration). Other causes may include: Injury. Side effects of some medicines. Too few salts and minerals in your body (electrolytes), such as potassium and calcium. This could happen if you are taking water pills (diuretics) or if you are pregnant. In many cases, the cause of muscle cramps or spasms is not known. Follow these instructions at home: Eating and drinking Drink enough fluid to keep your pee (urine) pale yellow. This can help prevent cramps or spasms. Eat a healthy diet that includes a lot of nutrients to help your muscles work. A healthy diet includes fruits and vegetables, lean protein, whole grains, and low-fat or nonfat dairy products. Managing pain and stiffness     Try to massage, stretch, and relax the affected muscle. Do this for a few minutes at a time. If told, put ice on the muscles. This may help if you are sore or have pain after a cramp or spasm. Put ice in a  plastic bag. Place a towel between your skin and the bag. Leave the ice on for 20 minutes, 2-3 times a day. If told, apply heat to tight or tense muscles as often as told by your health care provider. Use the heat source that your provider recommends, such as a moist heat pack or a heating pad. Place a towel between your skin and the heat source. Leave the heat on for 20-30 minutes. If your skin turns bright red, remove the ice or heat right away to prevent skin damage. The risk of damage is higher if you cannot feel pain, heat, or cold. Take hot showers or baths to help relax tight muscles. General instructions If you are having cramps often, avoid intense exercise for a few days. Take over-the-counter and prescription medicines only as told by your provider. Watch for any changes in your symptoms. Contact a health care provider  if: Your cramps or spasms get more severe or happen more often. Your cramps or spasms do not get better over time. This information is not intended to replace advice given to you by your health care provider. Make sure you discuss any questions you have with your health care provider. Document Revised: 04/02/2022 Document Reviewed: 04/02/2022 Elsevier Patient Education  2024 [employer].

## 2023-10-21 ENCOUNTER — Ambulatory Visit: Payer: Self-pay | Admitting: Registered Nurse

## 2023-10-21 ENCOUNTER — Encounter: Payer: Self-pay | Admitting: Registered Nurse

## 2023-10-21 VITALS — HR 66 | Resp 16

## 2023-10-21 DIAGNOSIS — T23221A Burn of second degree of single right finger (nail) except thumb, initial encounter: Secondary | ICD-10-CM

## 2023-10-21 MED ORDER — SILVER SULFADIAZINE 1 % EX CREA: 1.0000 | TOPICAL_CREAM | Freq: Every day | CUTANEOUS | 0 refills | Status: AC
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
## 2023-10-21 NOTE — Patient Instructions (Signed)
 Burn Care, Adult A burn is an injury to the skin or the tissues under the skin. It may be caused by a fire, hot liquid or steam, chemicals, electricity, or the sun. There are three types of burns: First degree. These burns are similar to a sunburn. They may cause your skin to be red, slightly swollen, and tender. They may be treated at home. Second degree. These burns are very painful. They may cause your skin to turn very red, swell, leak fluid, look shiny, and blister. In many cases, these burns may be treated at home. If they cover your hands, feet, face, or genitals, get help from a health care provider. Third degree. These burns are the most severe. They may not be painful, but you may feel pain around the edges of them. Your skin may turn white or black and may look charred, dry, and leathery. These burns cause lasting damage. If you get a third-degree burn, get help right away. Treatment will depend on the type of burn you have. Taking care of your burn can help to prevent pain and infection. It can also help the burn heal more quickly. How to care for a first-degree burn Right after the burn: Rinse or soak the burn under cool water for 5 minutes or more. Put a cool, wet cloth (cool compress) on your skin. This may help with pain. Do not put ice on your burn. This can cause more damage. Caring for the burn Clean and care for the burn as told by your provider. You may be told to: Use mild soap and water to clean the area. Use a clean cloth to pat the burned area dry after cleaning it. Do not rub or scrub the burn. Put lotion or aloe vera gel on your skin. How to care for a second-degree burn Right after the burn: Rinse or soak the burn under cool water. Do this for 5-10 minutes. Do not put ice on your burn. This can cause more damage. Take off any jewelry or clothing near the burn. Lightly cover the burn with a clean cloth. Caring for the burn Clean and care for the burn as told by your  provider. You may be told to: Clean or rinse out the burned area. Put a cream or ointment on the burn. You may need to use an antibiotic cream that has silver in it. This can kill bacteria. Place a germ-free (sterile) dressing over the burn. A dressing is a bandage that is put over a burn to help it heal. Raise (elevate) the injured area above the level of your heart while you are sitting or lying down. How to care for a third-degree burn Right after the burn: Lightly cover the burn with a clean, dry cloth. Get help right away. You may need to: Stay in the hospital. Have surgery to remove burned tissue or get a skin graft. Get fluids through an IV. Caring for the burn Clean and care for the burn as told by your provider. You may be told to: Clean or rinse out the burn. Put a cream or ointment on the burn. Put a sterile dressing in the burned area (packing). Put a sterile dressing over the burn. Use pressure (compression) dressings. Elevate the injured area above the level of your heart while you are sitting or lying down. Wear splints or immobilizers as told by your provider. Do exercises as told by your provider. Rest as told by your provider. Do not do sports or  other physical activities until your provider says that you can. How to prevent infection when caring for a burn  Take these steps to prevent infection and more damage to the tissue. Make sure you: Wash your hands with soap and water for at least 20 seconds before and after you care for your burn. If soap and water are not available, use hand sanitizer. Wear clean gloves as told by your provider. Do not put butter, oil, toothpaste, or other home remedies on the burn. Do not scratch or pick at the burn. Do not break any blisters. Do not peel the skin. Do not rub your burn, even when cleaning it. Check your burn every day for signs of infection. Check for: More redness, swelling, or pain. Warmth. Pus or a bad smell. Red  streaks around the burn. Follow these instructions at home Medicines Take over-the-counter and prescription medicines only as told by your provider. If you were prescribed antibiotics, take or apply them as told by your provider. Do not stop using the antibiotic even if you start to feel better. General instructions Do not use any products that contain nicotine or tobacco. These products include cigarettes, chewing tobacco, and vaping devices, such as e-cigarettes. If you need help quitting, ask your provider. Drink enough fluid to keep your pee (urine) pale yellow. Protect your burn from the sun. Contact a health care provider if: Your burn does not get better, or it gets worse. You have any signs of infection. Your burn starts to look different or gets black or red spots. Your pain does not get better with medicine. You have anxiety or depression after the injury. Get help right away if: You have red streaks near the burn. You are in severe pain. This information is not intended to replace advice given to you by your health care provider. Make sure you discuss any questions you have with your health care provider. Document Revised: 08/29/2022 Document Reviewed: 08/28/2022 Elsevier Patient Education  2024 [employer].

## 2023-11-03 ENCOUNTER — Other Ambulatory Visit: Payer: Self-pay

## 2023-11-07 ENCOUNTER — Encounter: Payer: No Typology Code available for payment source | Admitting: Gastroenterology

## 2023-11-28 ENCOUNTER — Encounter: Payer: Self-pay | Admitting: Gastroenterology

## 2023-12-01 ENCOUNTER — Telehealth: Payer: Self-pay | Admitting: Gastroenterology

## 2023-12-01 NOTE — Telephone Encounter (Signed)
 Returned patient call. Clarified prep times , starting in the morning at 2 am and completing prep, water , and anything additional by 4 am.  Patient voiced understanding.

## 2023-12-01 NOTE — Telephone Encounter (Signed)
 Needing to be advised on prep instructions.

## 2023-12-02 ENCOUNTER — Ambulatory Visit: Payer: No Typology Code available for payment source | Admitting: Gastroenterology

## 2023-12-02 ENCOUNTER — Encounter: Payer: Self-pay | Admitting: Gastroenterology

## 2023-12-02 VITALS — BP 123/74 | HR 74 | Temp 98.1°F | Resp 16 | Ht 66.0 in | Wt 156.0 lb

## 2023-12-02 DIAGNOSIS — D125 Benign neoplasm of sigmoid colon: Secondary | ICD-10-CM

## 2023-12-02 DIAGNOSIS — Z1211 Encounter for screening for malignant neoplasm of colon: Secondary | ICD-10-CM

## 2023-12-02 DIAGNOSIS — K5730 Diverticulosis of large intestine without perforation or abscess without bleeding: Secondary | ICD-10-CM

## 2023-12-02 DIAGNOSIS — K644 Residual hemorrhoidal skin tags: Secondary | ICD-10-CM

## 2023-12-02 DIAGNOSIS — K648 Other hemorrhoids: Secondary | ICD-10-CM

## 2023-12-02 MED ORDER — SODIUM CHLORIDE 0.9 % IV SOLN
500.0000 mL | INTRAVENOUS | Status: DC
Start: 2023-12-02 — End: 2023-12-02

## 2023-12-02 NOTE — Progress Notes (Signed)
 [facility] History and Physical   Primary Care Physician:  Default, Provider, MD   Reason for Procedure:  Colorectal cancer screening  Plan:    Screening colonoscopy with possible interventions as needed     HPI: Rebecca Sweeney is a very pleasant 63 y.o. female here for screening colonoscopy. Denies any nausea, vomiting, abdominal pain, melena or bright red blood per rectum  The risks and benefits as well as alternatives of endoscopic procedure(s) have been discussed and reviewed. All questions answered. The patient agrees to proceed.    Past Medical History:  Diagnosis Date   Chest pain    Emphysema of lung (HCC)    Frequent headaches    GERD (gastroesophageal reflux disease)    Hot flashes    Hyperlipidemia    Insomnia    Night sweats     Past Surgical History:  Procedure Laterality Date   NONE      Prior to Admission medications   Medication Sig Start Date End Date Taking? Authorizing Provider  busPIRone (BUSPAR) 15 MG tablet Take by mouth. 07/30/23 07/29/24 Yes [provider]  atorvastatin (LIPITOR) 20 MG tablet Take 1 tablet (20 mg total) by mouth daily. 08/05/23 11/03/23  Betancourt, Jarold Song, NP  azelastine (ASTELIN) 0.1 % nasal spray Place 2 sprays into both nostrils 2 (two) times daily. Patient not taking: Reported on 12/02/2023 07/23/22   [provider]  benzonatate (TESSALON) 200 MG capsule Take 1 capsule (200 mg total) by mouth 3 (three) times daily as needed for cough. Patient not taking: Reported on 12/02/2023 01/14/22   Barbaraann Barthel, NP  buPROPion (WELLBUTRIN XL) 150 MG 24 hr tablet Take 150 mg by mouth daily. Patient not taking: Reported on 12/02/2023 07/23/22   [provider]  Cholecalciferol (VITAMIN D3) 50 MCG (2000 UT) capsule Take 1 capsule (2,000 Units total) by mouth daily. Take with food Patient not taking: Reported on 12/02/2023 10/10/23 07/08/24  Barbaraann Barthel, NP  cyclobenzaprine (FLEXERIL) 10 MG tablet  Take 0.5-1 tablets (5-10 mg total) by mouth 3 (three) times daily as needed for muscle spasms. Patient not taking: Reported on 12/02/2023 10/14/23   Barbaraann Barthel, NP  estradiol (ESTRACE) 0.1 MG/GM vaginal cream Place 1 g vaginally every Wednesday and Saturday. Twice a week place one gram in vagina with applicator Patient not taking: Reported on 10/10/2023 03/20/22   [provider]  Estradiol-Norethindrone Acet 0.5-0.1 MG tablet  09/19/18   [provider]  fluconazole (DIFLUCAN) 150 MG tablet Take 1 tab now by mouth and may repeat in 72 hours if symptoms persist Patient not taking: Reported on 12/02/2023 06/24/23   Barbaraann Barthel, NP  fluticasone (FLONASE) 50 MCG/ACT nasal spray Place 1 spray into both nostrils 2 (two) times daily as needed for allergies or rhinitis. Patient not taking: Reported on 12/02/2023 06/07/21   Barbaraann Barthel, NP  Homeopathic Products (ZINC) LOZG Take 1 tablet by mouth daily.  Patient not taking: Reported on 12/02/2023    [provider]  loratadine (CLARITIN) 10 MG tablet Take 1 tablet (10 mg total) by mouth daily. Patient not taking: Reported on 10/10/2023 04/09/22   Barbaraann Barthel, NP  meclizine (ANTIVERT) 25 MG tablet Take 1 tablet (25 mg total) by mouth 4 (four) times daily as needed for dizziness. Patient not taking: Reported on 10/10/2023 09/25/23   Barbaraann Barthel, NP  meloxicam (MOBIC) 15 MG tablet Take 1 tablet (15 mg total) by mouth daily as needed for pain.  Patient not taking: Reported on 12/02/2023 10/14/23   Barbaraann Barthel, NP  mineral oil-hydrophilic petrolatum (AQUAPHOR) ointment Apply topically as needed for dry skin or irritation. Patient not taking: Reported on 12/02/2023 05/08/21   Albina Billet A, NP  montelukast (SINGULAIR) 10 MG tablet TAKE 1 TABLET BY MOUTH EVERYDAY AT BEDTIME Patient not taking: Reported on 12/02/2023 01/09/23   Barbaraann Barthel, NP  Multiple Vitamins-Minerals (ZINC PO) Take by mouth. Patient  not taking: Reported on 01/09/2023    [provider]  neomycin-bacitracin-polymyxin (NEOSPORIN) [phone] ointment Apply topically 2 (two) times daily as needed. Patient not taking: Reported on 12/02/2023 08/15/22   Barbaraann Barthel, NP  nicotine polacrilex (NICORETTE) 2 MG gum Take 1 each (2 mg total) by mouth as needed for smoking cessation. May chew one piece every 1-2 hours the first 6 weeks of use max 24 gum pieces in 24 hours; avoid food/drink 15 minutes before/after use Patient not taking: Reported on 10/10/2023 01/09/23   Barbaraann Barthel, NP  omeprazole (PRILOSEC) 20 MG capsule Take 1 capsule (20 mg total) by mouth daily. Patient not taking: Reported on 12/02/2023 07/29/23 10/27/23  Barbaraann Barthel, NP  phenazopyridine (PYRIDIUM) 200 MG tablet Take 1 tablet (200 mg total) by mouth 3 (three) times daily. Patient not taking: Reported on 11/14/2022 08/06/22   Barbaraann Barthel, NP  promethazine (PHENERGAN) 6.25 MG/5ML syrup Take 5 mLs (6.25 mg total) by mouth every 6 (six) hours as needed for up to 14 days for nausea or vomiting. Patient not taking: Reported on 04/23/2022 01/17/22 01/31/22  Albina Billet A, NP  silver sulfADIAZINE (SILVADENE) 1 % cream Apply 1 Application topically daily. Patient not taking: Reported on 12/02/2023 10/21/23   Albina Billet A, NP  sodium chloride (OCEAN) 0.65 % SOLN nasal spray Place 2 sprays into both nostrils every 2 (two) hours while awake. Patient not taking: Reported on 12/02/2023 01/09/23 10/14/23  Barbaraann Barthel, NP  traZODone (DESYREL) 100 MG tablet Take 100 mg by mouth at bedtime as needed. 04/18/22   [provider]    Current Outpatient Medications  Medication Sig Dispense Refill   busPIRone (BUSPAR) 15 MG tablet Take by mouth.     atorvastatin (LIPITOR) 20 MG tablet Take 1 tablet (20 mg total) by mouth daily.     azelastine (ASTELIN) 0.1 % nasal spray Place 2 sprays into both nostrils 2 (two) times daily. (Patient not taking:  Reported on 12/02/2023)     benzonatate (TESSALON) 200 MG capsule Take 1 capsule (200 mg total) by mouth 3 (three) times daily as needed for cough. (Patient not taking: Reported on 12/02/2023) 90 capsule 2   buPROPion (WELLBUTRIN XL) 150 MG 24 hr tablet Take 150 mg by mouth daily. (Patient not taking: Reported on 12/02/2023)     Cholecalciferol (VITAMIN D3) 50 MCG (2000 UT) capsule Take 1 capsule (2,000 Units total) by mouth daily. Take with food (Patient not taking: Reported on 12/02/2023) 90 capsule 2   cyclobenzaprine (FLEXERIL) 10 MG tablet Take 0.5-1 tablets (5-10 mg total) by mouth 3 (three) times daily as needed for muscle spasms. (Patient not taking: Reported on 12/02/2023) 30 tablet 0   estradiol (ESTRACE) 0.1 MG/GM vaginal cream Place 1 g vaginally every Wednesday and Saturday. Twice a week place one gram in vagina with applicator (Patient not taking: Reported on 10/10/2023)     Estradiol-Norethindrone Acet 0.5-0.1 MG tablet  (Patient not taking: Reported on 12/02/2023)     fluconazole (DIFLUCAN) 150 MG tablet  Take 1 tab now by mouth and may repeat in 72 hours if symptoms persist (Patient not taking: Reported on 12/02/2023) 2 tablet 0   fluticasone (FLONASE) 50 MCG/ACT nasal spray Place 1 spray into both nostrils 2 (two) times daily as needed for allergies or rhinitis. (Patient not taking: Reported on 12/02/2023) 16 mL 1   Homeopathic Products (ZINC) LOZG Take 1 tablet by mouth daily.  (Patient not taking: Reported on 12/02/2023)     loratadine (CLARITIN) 10 MG tablet Take 1 tablet (10 mg total) by mouth daily. (Patient not taking: Reported on 10/10/2023) 90 tablet 3   meclizine (ANTIVERT) 25 MG tablet Take 1 tablet (25 mg total) by mouth 4 (four) times daily as needed for dizziness. (Patient not taking: Reported on 10/10/2023) 30 tablet 0   meloxicam (MOBIC) 15 MG tablet Take 1 tablet (15 mg total) by mouth daily as needed for pain. (Patient not taking: Reported on 12/02/2023) 90 tablet 1   mineral oil-hydrophilic  petrolatum (AQUAPHOR) ointment Apply topically as needed for dry skin or irritation. (Patient not taking: Reported on 12/02/2023) 420 g 0   montelukast (SINGULAIR) 10 MG tablet TAKE 1 TABLET BY MOUTH EVERYDAY AT BEDTIME (Patient not taking: Reported on 12/02/2023) 90 tablet 3   Multiple Vitamins-Minerals (ZINC PO) Take by mouth. (Patient not taking: Reported on 01/09/2023)     neomycin-bacitracin-polymyxin (NEOSPORIN) [phone] ointment Apply topically 2 (two) times daily as needed. (Patient not taking: Reported on 12/02/2023) 28.3 g 0   nicotine polacrilex (NICORETTE) 2 MG gum Take 1 each (2 mg total) by mouth as needed for smoking cessation. May chew one piece every 1-2 hours the first 6 weeks of use max 24 gum pieces in 24 hours; avoid food/drink 15 minutes before/after use (Patient not taking: Reported on 10/10/2023) 100 tablet 1   omeprazole (PRILOSEC) 20 MG capsule Take 1 capsule (20 mg total) by mouth daily. (Patient not taking: Reported on 12/02/2023) 90 capsule 0   phenazopyridine (PYRIDIUM) 200 MG tablet Take 1 tablet (200 mg total) by mouth 3 (three) times daily. (Patient not taking: Reported on 11/14/2022) 6 tablet 0   promethazine (PHENERGAN) 6.25 MG/5ML syrup Take 5 mLs (6.25 mg total) by mouth every 6 (six) hours as needed for up to 14 days for nausea or vomiting. (Patient not taking: Reported on 04/23/2022) 120 mL 0   silver sulfADIAZINE (SILVADENE) 1 % cream Apply 1 Application topically daily. (Patient not taking: Reported on 12/02/2023) 50 g 0   sodium chloride (OCEAN) 0.65 % SOLN nasal spray Place 2 sprays into both nostrils every 2 (two) hours while awake. (Patient not taking: Reported on 12/02/2023)  0   traZODone (DESYREL) 100 MG tablet Take 100 mg by mouth at bedtime as needed.     Current Facility-Administered Medications  Medication Dose Route Frequency Provider Last Rate Last Admin   0.9 %  sodium chloride infusion  500 mL Intravenous Continuous Taivon Haroon, Eleonore Chiquito, MD        Allergies  as of 12/02/2023 - Review Complete 12/02/2023  Allergen Reaction Noted   Venlafaxine hcl er  03/21/2022    Family History  Problem Relation Age of Onset   Colon cancer Neg Hx    Colon polyps Neg Hx    Esophageal cancer Neg Hx    Rectal cancer Neg Hx    Stomach cancer Neg Hx     Social History   Socioeconomic History   Marital status: Married    Spouse name: 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[place])    Employer: [employer]  Tobacco Use   Smoking status: Every Day    Current packs/day: 1.00    Average packs/day: 1 pack/day for 47.0 years (47.0 ttl pk-yrs)    Types: Cigarettes    Start date: 12/13/1976    Passive exposure: Past (mom and dad smoked in home growing up she moved out at 63 y/o)   Smokeless tobacco: Never   Tobacco comments:    cutting back, uses it as stress relief, contemplating  Vaping Use   Vaping status: Never Used  Substance and Sexual Activity   Alcohol use: No   Drug use: No   Sexual activity: Not on file  Other Topics Concern   Not on file  Social History Narrative   Originally from [place]. Came to the [place] in 04/29/1989.   Lives with her husband.   3 adult children. All live in [place].   Social Drivers of Corporate investment banker Strain: Low Risk  (09/08/2023)   Received from [facility]   Overall Financial Resource Strain (CARDIA)    Difficulty of Paying Living Expenses: Not hard at all  Food Insecurity: No Food Insecurity (09/08/2023)   Received from [facility]   Hunger Vital Sign    Worried About Running Out of Food in the Last Year: Never true    Ran Out of Food in the Last Year: Never true  Transportation Needs: No Transportation Needs (09/08/2023)   Received from [facility] - Transportation    Lack of Transportation (Medical): No    Lack of Transportation (Non-Medical): No  Physical Activity:  Insufficiently Active (09/08/2023)   Received [facility]   Exercise Vital Sign    Days of Exercise per Week: 3 days    Minutes of Exercise per Session: 30 min  Stress: No Stress Concern Present (09/08/2023)   Received from [facility] - Occupational Stress Questionnaire    Feeling of Stress : Not at all  Social Connections: Socially Integrated (09/08/2023)   Received from [facility]   Social Network    How would you rate your social network (family, work, friends)?: Good participation with social networks  Intimate Partner Violence: Not At Risk (09/08/2023)   Received from [facility]   HITS    Over the last 12 months how often did your partner physically hurt you?: Never    Over the last 12 months how often did your partner insult you or talk down to you?: Never    Over the last 12 months how often did your partner threaten you with physical harm?: Never    Over the last 12 months how often did your partner scream or curse at you?: Never    Review of Systems:  All other review of systems negative except as mentioned in the HPI.  Physical Exam: Vital signs in last 24 hours: BP 107/67   Pulse 75   Temp 98.1 F (36.7 C)   Resp 20   Ht 5\' 6"  (1.676 m)   Wt 156 lb (70.8 kg)   LMP 03/18/2012   SpO2 97%   BMI 25.18 kg/m  General:   Alert, NAD Lungs:  Clear .   Heart:  Regular rate and rhythm Abdomen:  Soft, nontender and nondistended. Neuro/Psych:  Alert and cooperative.  Normal mood and affect. A and O x 3  Reviewed labs, radiology imaging, old records and pertinent past GI work up  Patient is appropriate for planned procedure(s) and anesthesia in an ambulatory setting   K. Scherry Ran , MD [phone]

## 2023-12-02 NOTE — Patient Instructions (Signed)

## 2023-12-02 NOTE — Progress Notes (Signed)
 Sedate, gd SR, tolerated procedure well, VSS, report to RN

## 2023-12-02 NOTE — Op Note (Signed)
 [facility] Patient Name: Rebecca Sweeney Procedure Date: 12/02/2023 7:59 AM MRN: [id number] Endoscopist: Napoleon Form , MD, [id number] Age: 63 Referring MD:  Date of Birth: Apr 27, 1961 Gender: Female Account #: [id number] Procedure:                Colonoscopy Indications:              Screening for colorectal malignant neoplasm Medicines:                Monitored Anesthesia Care Procedure:                Pre-Anesthesia Assessment:                           - Prior to the procedure, a History and Physical                            was performed, and patient medications and                            allergies were reviewed. The patient's tolerance of                            previous anesthesia was also reviewed. The risks                            and benefits of the procedure and the sedation                            options and risks were discussed with the patient.                            All questions were answered, and informed consent                            was obtained. Prior Anticoagulants: The patient has                            taken no anticoagulant or antiplatelet agents. ASA                            Grade Assessment: II - A patient with mild systemic                            disease. After reviewing the risks and benefits,                            the patient was deemed in satisfactory condition to                            undergo the procedure.                           After obtaining informed consent, the colonoscope  was passed under direct vision. Throughout the                            procedure, the patient's blood pressure, pulse, and                            oxygen saturations were monitored continuously. The                            [id number]-HQ190L Colonoscope [id number] was introduced                            through the anus and advanced to the the cecum,                            identified by  appendiceal orifice and ileocecal                            valve. The colonoscopy was performed without                            difficulty. The patient tolerated the procedure                            well. The quality of the bowel preparation was                            good. The ileocecal valve, appendiceal orifice, and                            rectum were photographed. Scope In: 8:11:29 AM Scope Out: 8:23:38 AM Scope Withdrawal Time: 0 hours 6 minutes 53 seconds  Total Procedure Duration: 0 hours 12 minutes 9 seconds  Findings:                 The perianal and digital rectal examinations were                            normal.                           A 7 mm polyp was found in the sigmoid colon. The                            polyp was sessile. The polyp was removed with a                            cold snare. Resection and retrieval were complete.                           Scattered large-mouthed, medium-mouthed and                            small-mouthed diverticula were found in the sigmoid  colon, descending colon, transverse colon and                            ascending colon.                           Non-bleeding external and internal hemorrhoids were                            found during retroflexion. The hemorrhoids were                            medium-sized. Complications:            No immediate complications. Estimated Blood Loss:     Estimated blood loss was minimal. Impression:               - One 7 mm polyp in the sigmoid colon, removed with                            a cold snare. Resected and retrieved.                           - Diverticulosis in the sigmoid colon, in the                            descending colon, in the transverse colon and in                            the ascending colon.                           - Non-bleeding external and internal hemorrhoids. Recommendation:           - Patient has a contact  number available for                            emergencies. The signs and symptoms of potential                            delayed complications were discussed with the                            patient. Return to normal activities tomorrow.                            Written discharge instructions were provided to the                            patient.                           - Resume previous diet.                           - Continue present medications.                           -  Await pathology results.                           - Repeat colonoscopy in 5-10 years for surveillance                            based on pathology results. Napoleon Form, MD 12/02/2023 8:33:05 AM This report has been signed electronically.

## 2023-12-02 NOTE — Progress Notes (Signed)
 Called to room to assist during endoscopic procedure.  Patient ID and intended procedure confirmed with present staff. Received instructions for my participation in the procedure from the performing physician.

## 2023-12-03 ENCOUNTER — Telehealth: Payer: Self-pay

## 2023-12-03 NOTE — Telephone Encounter (Signed)
  Follow up Call-     12/02/2023    7:29 AM 12/02/2023    7:26 AM  Call back number  Post procedure Call Back phone  # [phone]  Permission to leave phone message Yes Yes     Patient questions:  Do you have a fever, pain , or abdominal swelling? No. Pain Score  0 *  Have you tolerated food without any problems? Yes.    Have you been able to return to your normal activities? Yes.    Do you have any questions about your discharge instructions: Diet   No. Medications  No. Follow up visit  No.  Do you have questions or concerns about your Care? No.  Actions: * If pain score is 4 or above: No action needed, pain <4.

## 2023-12-05 LAB — SURGICAL PATHOLOGY

## 2023-12-25 ENCOUNTER — Encounter: Payer: Self-pay | Admitting: Registered Nurse

## 2023-12-25 ENCOUNTER — Telehealth: Payer: Self-pay | Admitting: Registered Nurse

## 2023-12-25 DIAGNOSIS — Z0000 Encounter for general adult medical examination without abnormal findings: Secondary | ICD-10-CM

## 2023-12-25 DIAGNOSIS — R12 Heartburn: Secondary | ICD-10-CM

## 2023-12-25 DIAGNOSIS — E782 Mixed hyperlipidemia: Secondary | ICD-10-CM

## 2023-12-25 DIAGNOSIS — E7800 Pure hypercholesterolemia, unspecified: Secondary | ICD-10-CM

## 2023-12-25 MED ORDER — OMEPRAZOLE 20 MG PO CPDR: 20.0000 mg | DELAYED_RELEASE_CAPSULE | Freq: Every day | ORAL | 2 refills | Status: AC

## 2023-12-25 MED ORDER — ATORVASTATIN CALCIUM 20 MG PO TABS: 20.0000 mg | ORAL_TABLET | Freq: Every day | ORAL | 2 refills | Status: AC

## 2023-12-25 MED ORDER — SALINE SPRAY 0.65 % NA SOLN: 2.0000 | NASAL | Status: AC

## 2023-12-25 NOTE — Telephone Encounter (Signed)
 Patient last filled atorvastatin  20mg  08/05/23 and omeprazole  20mg  DR 07/29/2023 90 tabs/capsules each from [facility].  Last labs with [facility] 12/24/23  Be Well 2026 paperwork completed by NP patient to sign tobacco attestation and ROI Be Well 2026 with RN Thersia Flax Component Ref Range & Units 1 d ago Comments  Cholesterol, Total 100 - 199 mg/dL 657 High    Triglycerides 0 - 149 mg/dL 846 High    HDL >96 mg/dL 56   VLDL Cholesterol Cal 5 - 40 mg/dL 36   LDL 0 - 99 mg/dL 295 High    LDL Calc Comment Comment Consider evaluating for Familial Hypercholesterolemia(FH), if clinically indicated.  LDL/HDL Ratio 0.0 - 3.2 ratio 4.1 High                                      LDL/HDL Ratio                                             Men  Women                               1/2 Avg.Risk  1.0    1.5                                   Avg.Risk  3.6    3.2                                2X Avg.Risk  6.2    5.0                                3X Avg.Risk  8.0    6.1  Resulting Agency [facility]   Narrative Performed by [employer] Performed at:  [address] [facility] [address] [place], [place]  [id number] Lab Director: Pearlean Botts MD, Phone:  [phone] Specimen Collected: 12/24/23 16:35   Performed by: Trenia Fritter Last Resulted: 12/25/23 03:35  Received From: [facility]  Result Received: 12/25/23 12:27   Component Ref Range & Units 1 d ago  Glucose 70 - 99 mg/dL 87  BUN 8 - 27 mg/dL 11  Creatinine 4.03 - 4.74 mg/dL 2.59  eGFR >56 LO/VFI/4.33 96  BUN/Creatinine Ratio 12 - 28 15  Sodium 134 - 144 mmol/L 139  Potassium 3.5 - 5.2 mmol/L 4.1  Chloride 96 - 106 mmol/L 100  CO2 20 - 29 mmol/L 26  CALCIUM  8.7 - 10.3 mg/dL 29.5  Total Protein 6.0 - 8.5 g/dL 7  Albumin, Serum 3.9 - 4.9 g/dL 4.7  Globulin, Total 1.5 - 4.5 g/dL 2.3  Total Bilirubin 0.0 - 1.2 mg/dL 0.5  Alkaline Phosphatase 44 - 121 IU/L 68  AST 0 - 40 IU/L 20  ALT (SGPT) 0 - 32 IU/L 28  Resulting Agency  [facility]  Narrative Performed by [employer] Performed at:  [address] [facility] [address] [place], [place]  [id number] Lab Director: Pearlean Botts MD, Phone:  [phone] Specimen Collected: 12/24/23 16:35   Performed by: Trenia Fritter Last Resulted: 12/25/23  03:35  Received From: [facility]  Result Received: 12/25/23 12:27  Last Filed Vital Signs Vital Sign Reading Time Taken Comments  Blood Pressure 120/82 12/24/2023 2:12 PM EDT    Pulse 72 12/24/2023 2:12 PM EDT    Temperature 36.3 C (97.3 F) 12/24/2023 2:12 PM EDT    Respiratory Rate - -    Oxygen Saturation 97% 12/24/2023 2:12 PM EDT    Inhaled Oxygen Concentration - -    Weight 71 kg (156 lb 9.6 oz) 12/24/2023 2:12 PM EDT    Height 165.1 cm (5\' 5" ) 12/24/2023 2:12 PM EDT    Body Mass Index 26.06 12/24/2023 2:12 PM EDT

## 2023-12-26 NOTE — Telephone Encounter (Signed)
 Patient signed alternative request due to tobacco use in the previous 12 months.  Given link for [employer] tobacco cessation video and tobacco cessation quiz.  Patient aware needs to be completed prior to 52 Jul 2025 for insurance discount eligibility.

## 2024-01-08 ENCOUNTER — Ambulatory Visit: Payer: Self-pay | Admitting: Gastroenterology

## 2024-01-08 NOTE — Telephone Encounter (Signed)
 Patient was locked out of her [place] my chart account and [facility].  She needed password reset and wanted to see her colonoscopy and lab results from [place] today.  Reviewed with patient below labs via epic screen shared in clinic with her.  She reset her [facility] my chart password and was able to not log into her [place] my chart on her cellular phone.  Patient stated tobacco cessation video link did not work resent to her today and new link worked via [employer].  Component Ref Range & Units 2 wk ago Comments  Cholesterol, Total 100 - 199 mg/dL 629 High    Triglycerides 0 - 149 mg/dL 528 High    HDL >41 mg/dL 56   VLDL Cholesterol Cal 5 - 40 mg/dL 36   LDL 0 - 99 mg/dL 324 High    LDL Calc Comment Comment Consider evaluating for Familial Hypercholesterolemia(FH), if clinically indicated.  LDL/HDL Ratio 0.0 - 3.2 ratio 4.1 High                                      LDL/HDL Ratio                                             Men  Women                               1/2 Avg.Risk  1.0    1.5                                   Avg.Risk  3.6    3.2                                2X Avg.Risk  6.2    5.0                                3X Avg.Risk  8.0    6.1  Resulting Agency [facility]   Narrative Performed by [employer] Performed at:  [address] [facility] [address] [place], [place]  [id number] Lab Director: Pearlean Botts MD, Phone:  [phone] Specimen Collected: 12/24/23 16:35   Performed by: Trenia Fritter Last Resulted: 12/25/23 03:35  Received From: [facility]  Result Received: 12/25/23 12:27   Component Ref Range & Units 2 wk ago  Glucose 70 - 99 mg/dL 87  BUN 8 - 27 mg/dL 11  Creatinine 5.95 - 6.38 mg/dL 7.56  eGFR >43 PI/RJJ/8.84 96  BUN/Creatinine Ratio 12 - 28 15  Sodium 134 - 144 mmol/L 139  Potassium 3.5 - 5.2 mmol/L 4.1  Chloride 96 - 106 mmol/L 100  CO2 20 - 29 mmol/L 26  CALCIUM  8.7 - 10.3 mg/dL 16.6  Total Protein 6.0 - 8.5 g/dL 7  Albumin, Serum 3.9 -  4.9 g/dL 4.7  Globulin, Total 1.5 - 4.5 g/dL 2.3  Total Bilirubin 0.0 - 1.2 mg/dL 0.5  Alkaline Phosphatase 44 - 121 IU/L 68  AST 0 - 40 IU/L 20  ALT (SGPT) 0 - 32 IU/L 28  Resulting Agency [facility]  Narrative Performed by [employer] Performed at:  [address] [facility] [address], [place], [place]  [id number] Lab Director: Pearlean Botts MD, Phone:  [phone] Specimen Collected: 12/24/23 16:35   Performed by: Trenia Fritter Last Resulted: 12/25/23 03:35  Received From: [facility]  Result Received: 12/25/23 12:27   View Encounter    SURGICAL PATHOLOGY [facility] [address] [place], [place] [address] Telephone [phone] or [phone] Fax [phone]  REPORT OF SURGICAL PATHOLOGY   Accession #: [id number] Patient Name: Rebecca Sweeney, Rebecca Sweeney Visit # : [id number]  MRN: [id number] Physician: Sergio Dandy DOB/Age 04-25-1961 (Age: 63) Gender: F Collected Date: 12/02/2023 Received Date: 12/04/2023  FINAL DIAGNOSIS       1. Surgical [P], colon, sigmoid, polyp (1) :      - TUBULAR ADENOMA      - NEGATIVE FOR HIGH-GRADE DYSPLASIA OR MALIGNANCY       DATE SIGNED OUT: 12/05/2023 ELECTRONIC SIGNATURE : Bernetta Brilliant Md, N[place]h, Pathologist, Electronic Signature  MICROSCOPIC DESCRIPTION  CASE COMMENTS STAINS USED IN DIAGNOSIS: H&E    CLINICAL HISTORY  SPECIMEN(S) OBTAINED 1. Surgical [P], Colon, Sigmoid, Polyp (1)  SPECIMEN COMMENTS: 1. Special screening for malignant neoplasms, colon; benign neoplasm of sigmoid colon SPECIMEN CLINICAL INFORMATION: 1. R/O adenoma    Gross Description 1. Received in formalin are tan, soft tissue fragments that are submitted in toto. Number: 1 Size: 0.5 cm, (1B) ( TA )   Patient had recently restarted her statin had run out for 6 weeks.  She notified her PCM.  Patient verbalized understanding of information/instructions and had no further questions at this time.

## 2024-01-13 ENCOUNTER — Telehealth: Payer: Self-pay | Admitting: Registered Nurse

## 2024-01-13 NOTE — Telephone Encounter (Signed)
 Jan 13, 2024 02:36:52PM Patient Name:  Rebecca Sweeney, Rebecca Sweeney Employer: [employer] Birth Date: 10/08/1960 Department:   Date of Injury: 01/08/2024 Insurance Co: [employer] Number:   Patient Description of Accident: lifting silver  trays   Diagnosis: 1. Sprain of other parts of lumbar spine and pelvis, initial encounter (S33.8XXA).   Work Status: Light duty as of 01/09/2024 Days Lost: 0 Case Status: OPEN Days Lt. Duty: 0 Next Appt: Work Comp Follow-up Visit on 01/23/2024

## 2024-01-28 NOTE — Telephone Encounter (Signed)
 Patient seen in workcenter stated all restrictions released by Dr Nora Beal at last appt but still having some discomfort low back.  Able to perform work duties.  Discussed continue stretches/heat/ice and medication prn.  Follow up with [facility] [employer] staff if new or worsening symptoms.  Patient agreed with plan of care and had no further questions at this time.  Skin warm dry and pink strength extremities 5/5 bilaterally gait sure and stead in workcenter.

## 2024-01-29 ENCOUNTER — Ambulatory Visit: Payer: Self-pay | Admitting: Registered Nurse

## 2024-01-29 NOTE — Progress Notes (Signed)
 Patient had follow up labs with [facility] 12/24/23

## 2024-02-11 NOTE — Telephone Encounter (Signed)
 See tcon dated 12/25/2023.  Results discussed in detail with patient and to follow up with [facility]  Patient agreed with plan of care and had no further questions at this time.

## 2024-02-12 NOTE — Telephone Encounter (Signed)
 Pt signed the no smoking cessation quiz for Be Well form

## 2024-02-13 NOTE — Telephone Encounter (Signed)
 Patient completed tobacco cessation quiz and video and alternative completed signed Be Well 2026 ROI and tobacco attestation and [employer] form given to HR Jen 12 Feb 2024  NP signed alternative completion form

## 2024-02-17 ENCOUNTER — Other Ambulatory Visit: Payer: Self-pay

## 2024-02-18 ENCOUNTER — Other Ambulatory Visit: Payer: Self-pay

## 2024-02-18 DIAGNOSIS — Z0000 Encounter for general adult medical examination without abnormal findings: Secondary | ICD-10-CM

## 2024-02-18 NOTE — Progress Notes (Signed)
 HgbA1C drawn. VS were obtained recently.

## 2024-02-19 LAB — HEMOGLOBIN A1C
Est. average glucose Bld gHb Est-mCnc: 108 mg/dL
Hgb A1c MFr Bld: 5.4 % (ref 4.8–5.6)

## 2024-02-26 ENCOUNTER — Ambulatory Visit: Payer: Self-pay | Admitting: Nurse Practitioner

## 2024-02-27 DIAGNOSIS — D125 Benign neoplasm of sigmoid colon: Secondary | ICD-10-CM | POA: Insufficient documentation

## 2024-03-30 ENCOUNTER — Telehealth: Payer: Self-pay | Admitting: Registered Nurse

## 2024-03-30 ENCOUNTER — Encounter: Payer: Self-pay | Admitting: Registered Nurse

## 2024-03-30 DIAGNOSIS — E7800 Pure hypercholesterolemia, unspecified: Secondary | ICD-10-CM

## 2024-03-30 NOTE — Telephone Encounter (Signed)
 Ordered Prescriptions - documented in this encounter Reconcile with Patient's Chart Ordered Prescriptions Prescription Sig Dispense Quantity Refills Last Filled Start Date End Date  atorvastatin  (LIPITOR) 80 mg tablet  Indications: Hyperlipidemia Take one tablet (80 mg dose) by mouth daily. 90 tablet  3   03/27/2024    Patient saw [facility] 03/24/24 had labs and atorvastatin  dose changed  Filled for patient today from PDRx 40mg  sig t2 po daily #90 RF0  last filled atorvastatin  20mg  take daily #90 12/25/23  PCM noted coronary artery calcification on recent CT scan starting daily baby aspirin also.  Follow up with PCM requested 06/24/24 for labs and re-evaluation  CMET stable normal.  Component Ref Range & Units 6 d ago Comments  Cholesterol, Total 100 - 199 mg/dL 784 High    Triglycerides 0 - 149 mg/dL 835 High    HDL >60 mg/dL 45   VLDL Cholesterol Cal 5 - 40 mg/dL 30   LDL 0 - 99 mg/dL 859 High    LDL/HDL Ratio 0.0 - 3.2 ratio 3.1                                     LDL/HDL Ratio                                             Men  Women                               1/2 Avg.Risk  1.0    1.5                                   Avg.Risk  3.6    3.2                                2X Avg.Risk  6.2    5.0                                3X Avg.Risk  8.0    6.1  Resulting Agency [facility]   Narrative Performed by [employer] Performed at:  [address] [facility] [address] [place], [place]  [id number] Lab Director: Frankey Sas MD, Phone:  [phone] Specimen Collected: 03/24/24 10:05   Performed by: HOYT Last Resulted: 03/25/24 03:35  Received From: [facility]  Result Received: 03/30/24 12:58  Lipids were improving on 20mg  po daily but not at goal  Component Ref Range & Units 6 d ago  Glucose 70 - 99 mg/dL 91  BUN 8 - 27 mg/dL 9  Creatinine 9.42 - 8.99 mg/dL 9.38  eGFR >40 fO/fpw/8.26 101  BUN/Creatinine Ratio 12 - 28 15  Sodium 134 - 144 mmol/L 143  Potassium 3.5 - 5.2  mmol/L 4.2  Chloride 96 - 106 mmol/L 105  CO2 20 - 29 mmol/L 22  CALCIUM  8.7 - 10.3 mg/dL 9.5  Total Protein 6.0 - 8.5 g/dL 6.5  Albumin, Serum 3.9 - 4.9 g/dL 4.4  Globulin, Total 1.5 - 4.5 g/dL 2.1  Total Bilirubin 0.0 - 1.2 mg/dL 0.3  Alkaline Phosphatase 44 - 121 IU/L 64  AST 0 - 40 IU/L 14  ALT (SGPT) 0 - 32 IU/L 18  Resulting Agency [facility]  Narrative Performed by [employer] Performed at:  [address] [facility] [address], [place], [place]  [id number] Lab Director: Frankey Sas MD, Phone:  [phone] Specimen Collected: 03/24/24 10:05   Performed by: HOYT Last Resulted: 03/25/24 03:35  Received From: [facility]  Result Received: 03/30/24 12:58   Component Ref Range & Units 3 mo ago Comments  Cholesterol, Total 100 - 199 mg/dL 680 High    Triglycerides 0 - 149 mg/dL 812 High    HDL >60 mg/dL 56   VLDL Cholesterol Cal 5 - 40 mg/dL 36   LDL 0 - 99 mg/dL 772 High    LDL Calc Comment Comment Consider evaluating for Familial Hypercholesterolemia(FH), if clinically indicated.  LDL/HDL Ratio 0.0 - 3.2 ratio 4.1 High                                      LDL/HDL Ratio                                             Men  Women                               1/2 Avg.Risk  1.0    1.5                                   Avg.Risk  3.6    3.2                                2X Avg.Risk  6.2    5.0                                3X Avg.Risk  8.0    6.1  Resulting Agency [facility]   Narrative Performed by [employer] Performed at:  [address] [facility] [address], [place], [place]  [id number] Lab Director: Frankey Sas MD, Phone:  [phone] Specimen Collected: 12/24/23 16:35   Performed by: HOYT Last Resulted: 12/25/23 03:35  Received From: [facility]  Result Received: 12/25/23 12:27    Component Ref Range & Units 8 mo ago Comments  Cholesterol, Total 100 - 199 mg/dL 673 High    Triglycerides 0 - 149 mg/dL 807 High    HDL >60 mg/dL 52   VLDL  Cholesterol Cal 5 - 40 mg/dL 38   LDL 0 - 99 mg/dL 763 High    LDL Calc Comment Comment Consider evaluating for Familial Hypercholesterolemia(FH), if clinically indicated.  LDL/HDL Ratio 0.0 - 3.2 ratio 4.5 High                                      LDL/HDL Ratio  Men  Women                               1/2 Avg.Risk  1.0    1.5                                   Avg.Risk  3.6    3.2                                2X Avg.Risk  6.2    5.0                                3X Avg.Risk  8.0    6.1  Resulting Agency [facility]   Narrative Performed by [employer] Performed at:  [address] [facility] [address], [place], [place]  [id number] Lab Director: Frankey Sas MD, Phone:  [phone] Specimen Collected: 07/30/23 16:57   Performed by: HOYT Last Resulted: 07/31/23 04:35  Received From: [facility]  Result Received: 11/24/23 12:00   Component Ref Range & Units 1 yr ago Comments  Cholesterol, Total 100 - 199 mg/dL 781 High    Triglycerides 0 - 149 mg/dL 866   HDL >60 mg/dL 47   VLDL Cholesterol Cal 5 - 40 mg/dL 24   LDL 0 - 99 mg/dL 852 High    LDL/HDL Ratio 0.0 - 3.2 ratio 3.1                                     LDL/HDL Ratio                                             Men  Women                               1/2 Avg.Risk  1.0    1.5                                   Avg.Risk  3.6    3.2                                2X Avg.Risk  6.2    5.0                                3X Avg.Risk  8.0    6.1  Resulting Agency [facility]   Narrative Performed by [facility] Performed at:  [address] [facility] [address] [place], [place]  [id number] Lab Director: Frankey Sas MD, Phone:  [phone] Specimen Collected: 07/17/22 08:50   Performed by: HOYT Last Resulted: 07/18/22 01:35  Received From: [facility]  Result Received: 07/23/22 14:41

## 2024-04-15 ENCOUNTER — Ambulatory Visit: Admitting: Registered Nurse

## 2024-04-15 ENCOUNTER — Encounter: Payer: Self-pay | Admitting: Registered Nurse

## 2024-04-15 VITALS — BP 98/68 | HR 96 | Temp 98.1°F

## 2024-04-15 DIAGNOSIS — B353 Tinea pedis: Secondary | ICD-10-CM

## 2024-04-15 DIAGNOSIS — B070 Plantar wart: Secondary | ICD-10-CM

## 2024-04-15 DIAGNOSIS — L03031 Cellulitis of right toe: Secondary | ICD-10-CM

## 2024-04-15 MED ORDER — AMOXICILLIN-POT CLAVULANATE 875-125 MG PO TABS
1.0000 | ORAL_TABLET | Freq: Two times a day (BID) | ORAL | 0 refills | Status: AC
Start: 2024-04-15 — End: 2024-04-22

## 2024-04-15 NOTE — Patient Instructions (Addendum)
 Athlete's Foot Athlete's foot (tinea pedis) is a fungal infection of the skin on your feet. It often occurs on the skin that is between or underneath the toes. It can also occur on the soles of your feet. The infection can spread from person to person (is contagious). It can also spread when a person's bare feet come in contact with the fungus on shower floors or on items such as shoes. What are the causes? This condition is caused by a fungus that grows in warm, moist places. You can get athlete's foot by sharing shoes, shower stalls, towels, and wet floors with someone who is infected. Not washing your feet or changing your socks often enough can also lead to athlete's foot. What increases the risk? This condition is more likely to develop in: Men. People who have a weak body defense system (immune system). People who have diabetes. People who use public showers, such as at a gym. People who wear heavy-duty shoes, such as Youth worker. Seasons with warm, humid weather. What are the signs or symptoms? Symptoms of this condition include: Itchy areas between your toes or on the soles of your feet. White, flaky, or scaly areas between your toes or on the soles of your feet. Very itchy small blisters between your toes or on the soles of your feet. Small cuts in your skin. These cuts can become infected. Thick or discolored toenails. How is this diagnosed? This condition may be diagnosed with a physical exam and a review of your medical history. Your health care provider may also take a skin or toenail sample to examine under a microscope. How is this treated? This condition is treated with antifungal medicines. These may be applied as powders, ointments, or creams. In severe cases, an oral antifungal medicine may be given. Follow these instructions at home: Medicines Apply or take over-the-counter and prescription medicines only as told by your health care provider. Apply your  antifungal medicine as told by your health care provider. Do not stop using the antifungal even if your condition improves. Foot care Do not scratch your feet. Keep your feet dry: Wear cotton or wool socks. Change your socks every day or if they become wet. Wear shoes that allow air to flow, such as sandals or canvas tennis shoes. Wash and dry your feet, including the area between your toes. Also, wash and dry your feet: Every day or as told by your health care provider. After exercising. General instructions Do not let others use towels, shoes, nail clippers, or other personal items that touch your feet. Protect your feet by wearing sandals in wet areas, such as locker rooms and shared showers. Keep all follow-up visits. This is important. If you have diabetes, keep your blood sugar under control. Contact a health care provider if: You have a fever. You have swelling, soreness, warmth, or redness in your foot. Your feet are not getting better with treatment. Your symptoms get worse. You have new symptoms. You have severe pain. Summary Athlete's foot (tinea pedis) is a fungal infection of the skin on your feet. It often occurs on skin that is between or underneath the toes. This condition is caused by a fungus that grows in warm, moist places. Symptoms include white, flaky, or scaly areas between your toes or on the soles of your feet. This condition is treated with antifungal medicines. Keep your feet clean. Always dry them thoroughly. This information is not intended to replace advice given to you by  your health care provider. Make sure you discuss any questions you have with your health care provider. Document Revised: 12/03/2020 Document Reviewed: 12/03/2020 Elsevier Patient Education  2024 [employer]Cellulitis, Adult  Cellulitis is a skin infection. The infected area is usually warm, red, swollen, and tender. It most commonly occurs on the lower body, such as the legs, feet,  and toes, but this condition can occur on any part of the body. The infection can travel to the muscles, blood, and underlying tissue and become life-threatening without treatment. It is important to get medical treatment right away for this condition. What are the causes? Cellulitis is caused by bacteria. The bacteria enter through a break in the skin, such as a cut, burn, insect or animal bite, open sore, or crack. What increases the risk? This condition is more likely to occur in people who: Have a weak body's defense system (immune system). Are older than 62 years old. Have diabetes. Have a type of long-term (chronic) liver disease (cirrhosis) or kidney disease. Are obese. Have a skin condition such as: An itchy rash, such as eczema or psoriasis. A fungal rash on the feet or in skinfolds. Blistering rashes, such as shingles or chickenpox. Slow movement of blood in the veins (venous stasis). Fluid buildup below the skin (edema). Have open wounds on the skin, such as cuts, puncture wounds, burns, bites, scrapes, tattoos, piercings, or wounds from surgery. Have had radiation therapy. Use IV drugs. What are the signs or symptoms? Symptoms of this condition include: Skin that looks red, purple, or slightly darker than your usual skin color. Streaks or spots on the skin. Swollen area of the skin. Tenderness or pain when an area of the skin is touched. Warm skin. Fever or chills. Blisters. Tiredness (fatigue). How is this diagnosed? This condition is diagnosed based on a medical history and physical exam. You may also have tests, including: Blood tests. Imaging tests. Tests on a sample of fluid taken from the wound (wound culture). How is this treated? Treatment for this condition may include: Medicines. These may include antibiotics or medicines to treat allergies (antihistamines). Rest. Applying cold or warm wet cloths (compresses) to the skin. If the condition is severe, you  may need to stay in the hospital and get antibiotics through an IV. The infection usually starts to get better within 1-2 days of treatment. Follow these instructions at home: Medicines Take over-the-counter and prescription medicines only as told by your health care provider. If you were prescribed antibiotics, take them as told by your provider. Do not stop using the antibiotic even if you start to feel better. General instructions Drink enough fluid to keep your pee (urine) pale yellow. Do not touch or rub the infected area. Raise (elevate) the infected area above the level of your heart while you are sitting or lying down. Return to your normal activities as told by your provider. Ask your provider what activities are safe for you. Apply warm or cold compresses to the affected area as told by your provider. Keep all follow-up visits. Your provider will need to make sure that a more serious infection is not developing. Contact a health care provider if: You have a fever. Your symptoms do not improve within 1-2 days of starting treatment or you develop new symptoms. Your bone or joint underneath the infected area becomes painful after the skin has healed. Your infection returns in the same area or another area. Signs of this may include: You notice a swollen bump  in the infected area. Your red area gets larger, turns dark in color, or becomes more painful. Drainage increases. Pus or a bad smell develops in your infected area. You have more pain. You feel ill and have muscle aches and weakness. You develop vomiting or diarrhea that will not go away. Get help right away if: You notice red streaks coming from the infected area. You notice the skin turns purple or black and falls off. This symptom may be an emergency. Get help right away. Call 911. Do not wait to see if the symptom will go away. Do not drive yourself to the hospital. This information is not intended to replace advice  given to you by your health care provider. Make sure you discuss any questions you have with your health care provider. Document Revised: 04/09/2022 Document Reviewed: 04/09/2022 Elsevier Patient Education  2024 [employer]Warts  Warts are small growths on the skin. They are common and can occur on many areas of the body. A person may have one wart or several warts. In many cases, warts do not need treatment. They usually go away on their own over a period of many months to a few years. If needed, warts that cause problems or do not go away on their own can be treated. What are the causes? Warts are caused by a type of virus called human papillomavirus (HPV). HPV can spread from person to person through direct contact. Warts can also spread to other areas of the body when a person scratches a wart and then scratches another area of his or her body. What increases the risk? You are more likely to develop this condition if: You are 21-12 years old. You have a weakened body defense system (immune system). You are Caucasian. What are the signs or symptoms? The main symptom of this condition is small growths on the skin. Warts may: Be round or oval or have an irregular shape. Have a rough surface. Range in color from skin color to light yellow, brown, or gray. Generally be less than  inch (1.3 cm) in size. Go away and then come back again. Most warts are painless, but some can be painful if they are large or occur in an area of the body where pressure is applied to them, such as the bottom of the foot. How is this diagnosed? A wart can usually be diagnosed based on its appearance. In some cases, a tissue sample may be removed (biopsy) to be looked at under a microscope. How is this treated? In many cases, warts do not need treatment. Sometimes treatment is wanted. If treatment is needed or wanted, options may include: Applying medicated solutions, creams, or patches to the wart. These may  be over-the-counter or prescription medicines that make the skin soft so that layers will gradually shed away. In many cases, the medicine is applied one or two times per day and covered with a bandage. Putting duct tape over the top of the wart (occlusion). You will leave the tape in place for as long as told by your health care provider and then replace it with a new strip of tape. This is done until the wart goes away. Freezing the wart with liquid nitrogen (cryotherapy). Burning the wart with: Laser treatment. An electrified probe (electrocautery). Injection of a medicine into the wart to help the body's immune system fight off the wart. Surgery to remove the wart. Follow these instructions at home: Medicines Use over-the-counter and prescription medicines only as told  by your health care provider. Do not use over-the-counter wart medicines on your face or genitals unless your health care provider tells you to do that. Lifestyle Keep your immune system healthy. To do this: Eat a healthy, balanced diet. Get enough sleep. Do not use any products that contain nicotine  or tobacco. These products include cigarettes, chewing tobacco, and vaping devices, such as e-cigarettes. If you need help quitting, ask your health care provider. General instructions  Wash your hands after you touch a wart. Do not scratch or pick at a wart. Avoid shaving hair that is over a wart. Keep all follow-up visits. This is important. Contact a health care provider if: Your warts do not improve after treatment. You have redness, swelling, or pain at the site of a wart. You have bleeding from a wart that does not stop with light pressure. You have diabetes and you develop a wart. Summary Warts are small growths on the skin. They are common and can occur on many areas of the body. In many cases, warts do not need treatment. Sometimes treatment is wanted. If treatment is needed or wanted, there are several treatment  options. Apply over-the-counter and prescription medicines only as told by your health care provider. Wash your hands after you touch a wart. Keep all follow-up visits. This is important. This information is not intended to replace advice given to you by your health care provider. Make sure you discuss any questions you have with your health care provider. Document Revised: 09/14/2021 Document Reviewed: 09/14/2021 Elsevier Patient Education  2024 [employer]Corns and Calluses: What to Know     Corns and calluses are skin conditions that can cause discomfort. Corns are small areas of thickened skin that usually form on a toe. They have a cone-shaped core that can press on nerves, causing pain. Calluses are larger areas of thickened skin that can form anywhere on the body. They often show up on the hands, soles of the feet, and heels. Calluses don't have a pointy core like corns do. What are the causes? Corns and calluses are caused by rubbing or pressure. Tight or bad-fitting shoes are often what cause these things. What increases the risk? Corns Corns are more likely to develop in people who have misshapen toes, such as hammertoes. Calluses Calluses are more likely to develop in people who: Work with their hands. Wear shoes that fit poorly, are too tight, or have high heels. Have misshapen toes. Some conditions make you more likely to get calluses and can lead to problems like ulceration or infection. These include: Diabetes. Poor blood flow. Numbness in your feet. What are the signs or symptoms? Symptoms of a corn or callus include: A hard growth on the skin. Pain or tenderness under the skin. Redness and swelling. More discomfort when wearing tight shoes, if your feet are affected. If a corn or callus becomes infected, symptoms may include: Redness and swelling that gets worse. Pain. Fluid, blood, or pus coming out of the corn or callus. How is this diagnosed? Corns and  calluses may be diagnosed based on your: Symptoms. Medical history. Physical exam. How is this treated? Treatment for corns and calluses may include: Fixing the cause, like changing shoes, wearing gloves, or using shoe inserts or protective pads. Applying lotion to soften the skin. This may include using a medicated moisturizer. Gently removing dead layers of skin. Removing the corn or callus with a scalpel or laser. This is done by a health care provider who specializes  in skin or feet (dermatologist or podiatrist). Taking antibiotics, if it's infected. Having surgery for a misshapen toe. Follow these instructions at home:  Take your medicine only as told. If you were given antibiotics, take them as told. Do not stop taking them even if you start to feel better. Wear shoes that fit well. Avoid wearing shoes that have high heels or are too tight or too loose. Wear padding, protective layers, gloves, or orthotics as told. Soak your hands or feet and file the corn or callus with a file or pumice stone. Do this as told by your provider. Check your corn or callus every day for signs of infection. Contact a health care provider if: Your symptoms don't get better with treatment. You have redness or swelling that gets worse. Your corn or callus is painful. You have fluid, blood, or pus coming from your corn or callus. You have new symptoms. Get help right away if: You have very bad pain with redness. This information is not intended to replace advice given to you by your health care provider. Make sure you discuss any questions you have with your health care provider. Document Revised: 02/06/2023 Document Reviewed: 02/06/2023 Elsevier Patient Education  2024 [employer].

## 2024-04-15 NOTE — Progress Notes (Signed)
 Established Patient Office Visit  Subjective   Patient ID: Rebecca Sweeney, female    DOB: Aug 15, 1961  Age: 63 y.o. MRN: [id number]  Chief Complaint  Patient presents with   Rash    Between toes right foot    63y/o established caucasian female with pain and rash between toes on right foot.  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  Toe red/swollen tried to pad with gauze and bandaid.      Review of Systems  Constitutional:  Negative for chills, diaphoresis and fever.  Respiratory:  Negative for cough.   Gastrointestinal:  Negative for diarrhea, nausea and vomiting.  Musculoskeletal:  Positive for myalgias.  Skin:  Positive for rash. Negative for itching.  Neurological:  Negative for dizziness, tingling, tremors, weakness and headaches.      Objective:     BP 98/68 (BP Location: Left Arm, Patient Position: Sitting, Cuff Size: Normal)   Pulse 96   Temp 98.1 F (36.7 C) (Temporal)   LMP 03/18/2012   SpO2 99%    Physical Exam Vitals and nursing note reviewed.  Constitutional:      General: She is awake. She is not in acute distress.    Appearance: Normal appearance. She is well-developed, well-groomed and normal weight. She is not ill-appearing, toxic-appearing or diaphoretic.  HENT:     Head: Normocephalic and atraumatic.     Jaw: There is normal jaw occlusion.     Salivary Glands: Right salivary gland is not diffusely enlarged. Left salivary gland is not diffusely enlarged.     Right Ear: Hearing and external ear normal.     Left Ear: Hearing and external ear normal.     Nose: Nose normal. No congestion or rhinorrhea.     Mouth/Throat:     Lips: Pink. No lesions.     Mouth: Mucous membranes are moist.     Pharynx: Oropharynx is clear.  Eyes:     General: Lids are normal. Vision grossly intact. Gaze aligned appropriately. No scleral icterus.        Right eye: No discharge.        Left eye: No discharge.     Extraocular Movements: Extraocular movements intact.     Conjunctiva/sclera: Conjunctivae normal.     Pupils: Pupils are equal, round, and reactive to light.  Neck:     Trachea: Trachea normal.  Cardiovascular:     Rate and Rhythm: Normal rate and regular rhythm.  Pulmonary:     Effort: Pulmonary effort is normal. No respiratory distress.     Breath sounds: Normal breath sounds and air entry. No stridor or transmitted upper airway sounds. No wheezing.     Comments: Spoke full sentences without difficulty; no cough observed in exam room Abdominal:     General: Abdomen is flat.     Palpations: Abdomen is soft.  Musculoskeletal:        General: Swelling and tenderness present. No signs of injury. Normal range of motion.     Cervical back: Normal range of motion and neck supple. No rigidity or tenderness.     Right lower leg: No edema.     Left lower leg: No edema.     Right foot: Normal range of motion and normal capillary refill. Swelling and tenderness present. No deformity, bunion, Charcot foot, foot drop, prominent metatarsal heads, laceration, bony  tenderness or crepitus. Normal pulse.     Comments: Patient limping favoring right foot on arrival to clinic; see skin exam toes 4 and 5 right with mild erythema TTP and  swelling 0-1+/4 nonpitting  Lymphadenopathy:     Head:     Right side of head: No submandibular or preauricular adenopathy.     Left side of head: No submandibular or preauricular adenopathy.     Cervical: No cervical adenopathy.     Right cervical: No superficial cervical adenopathy.    Left cervical: No superficial cervical adenopathy.  Skin:    General: Skin is warm and dry.     Capillary Refill: Capillary refill takes less than 2 seconds.     Coloration: Skin is not ashen, cyanotic, jaundiced, mottled, pale or sallow.     Findings: Erythema and rash present. No abrasion, abscess, acne, bruising, burn,  ecchymosis, signs of injury, laceration, petechiae or wound. Rash is macular and scaling. Rash is not crusting, nodular, papular, purpuric, pustular, urticarial or vesicular.     Nails: There is no clubbing.         Comments: Scaling noted between 4-5 digits right foot-- mild maceration noted and pungent odor dry; punctates noted and callous 4mm right lateral 4th toe between 4-5 tender dry; patient had on bandaid and gauze between toes under socks removed for eval; erythema medial right toe 5th and proximal and distal toe 4 right mildly TTP each  Neurological:     General: No focal deficit present.     Mental Status: She is alert and oriented to person, place, and time. Mental status is at baseline.     GCS: GCS eye subscore is 4. GCS verbal subscore is 5. GCS motor subscore is 6.     Cranial Nerves: Cranial nerves 2-12 are intact. No cranial nerve deficit, dysarthria or facial asymmetry.     Sensory: Sensation is intact.     Motor: Motor function is intact. No weakness, tremor, atrophy, abnormal muscle tone or seizure activity.     Coordination: Coordination is intact. Coordination normal.     Gait: Gait is intact. Gait normal.     Comments: In/out of chair and on/off exam table without difficulty; gait sure and steady in clinic; bilateral hand grasp equal 5/5  Psychiatric:        Attention and Perception: Attention and perception normal.        Mood and Affect: Mood and affect normal.        Speech: Speech normal.        Behavior: Behavior normal. Behavior is cooperative.        Thought Content: Thought content normal.        Cognition and Memory: Cognition and memory normal.        Judgment: Judgment normal.      No results found for any visits on 04/15/24.    The 10-year ASCVD risk score (Arnett DK, et al., 2019) is: 5.7%    Assessment & Plan:   Problem List Items Addressed This Visit   None Visit Diagnoses       Plantar wart of right foot    -  Primary     Cellulitis of  fourth toe of right foot         Tinea pedis of right foot         Patient reported shoe more comfortable after procedure and able to walk normally again.  Verbal consent obtained and shaved with #10 blade after  cleansing with povidone/iodine right 4th toe lateral adjacent skin inflamed/red TTP started on augmentin  875mg  po BID x 7 days #20 RF0 to cover for cellulitis MRSA patient with history.  Shaved wart until pinpoint of blood noted and slight tenderness. Band-Aid and neosporin applied from clinic stock. Patient reported minimal pain after procedure. Discussed with patient to keep clean and dry and follow up in 7 days for reshave if punctates/hard area noted again.  Cryotherapy not available in [facility] clinic.  PCM may have available.  Discussed with patient 2-3  treatments may be required to eradicate wart. Exitcare handout on plantar warts, cellulits and corns Patient notified to expect scab at treated area but if erythema, purulent drainage, red streak radiating up hand/foot to return to clinic for reevaluation. Patient agreed with plan of care, verbalized understanding of instructions/information and had no further questions at this time.   Athlete's foot powder or spray daily prn OTC recommended.  Change socks more often than once a day if sweat soaked.  Rotate shoes to allow them to completely air dry do not wear the same pair every day.  Place shoes with laces loosened in sunlight UV light helps to kill fungus.  Medication as directed. Call or return to clinic as needed if these symptoms  worsen or fail to improve as anticipated. Exitcare handout on athletes foot.  Reoccurrence of condition common and may require retreatment. Patient verbalized agreement and understanding of treatment plan and had no  further questions at this time. P2: Avoidance walking barefoot and hand washing.  Return if symptoms worsen or fail to improve.    Ellouise DELENA Hope, NP

## 2024-05-27 ENCOUNTER — Encounter: Payer: Self-pay | Admitting: Registered Nurse

## 2024-05-27 ENCOUNTER — Ambulatory Visit: Payer: Self-pay | Admitting: Registered Nurse

## 2024-05-27 VITALS — BP 105/66 | HR 66 | Temp 97.0°F | Resp 18

## 2024-05-27 DIAGNOSIS — B070 Plantar wart: Secondary | ICD-10-CM

## 2024-05-27 NOTE — Progress Notes (Signed)
 Employee in clinic for NP visit regarding Rt foot and 4th toe pain. Follow up to prior appt, stating 8/10 foot and 4th toe pain. No analgesics taken and tries to stay off of foot when able. See NP notes.

## 2024-05-27 NOTE — Patient Instructions (Signed)
Warts  Warts are small growths on the skin. They are common and can occur on many areas of the body. A person may have one wart or several warts. In many cases, warts do not need treatment. They usually go away on their own over a period of many months to a few years. If needed, warts that cause problems or do not go away on their own can be treated. What are the causes? Warts are caused by a type of virus called human papillomavirus (HPV). HPV can spread from person to person through direct contact. Warts can also spread to other areas of the body when a person scratches a wart and then scratches another area of his or her body. What increases the risk? You are more likely to develop this condition if: You are 10-20 years old. You have a weakened body defense system (immune system). You are Caucasian. What are the signs or symptoms? The main symptom of this condition is small growths on the skin. Warts may: Be round or oval or have an irregular shape. Have a rough surface. Range in color from skin color to light yellow, brown, or gray. Generally be less than  inch (1.3 cm) in size. Go away and then come back again. Most warts are painless, but some can be painful if they are large or occur in an area of the body where pressure is applied to them, such as the bottom of the foot. How is this diagnosed? A wart can usually be diagnosed based on its appearance. In some cases, a tissue sample may be removed (biopsy) to be looked at under a microscope. How is this treated? In many cases, warts do not need treatment. Sometimes treatment is wanted. If treatment is needed or wanted, options may include: Applying medicated solutions, creams, or patches to the wart. These may be over-the-counter or prescription medicines that make the skin soft so that layers will gradually shed away. In many cases, the medicine is applied one or two times per day and covered with a bandage. Putting duct tape over  the top of the wart (occlusion). You will leave the tape in place for as long as told by your health care provider and then replace it with a new strip of tape. This is done until the wart goes away. Freezing the wart with liquid nitrogen (cryotherapy). Burning the wart with: Laser treatment. An electrified probe (electrocautery). Injection of a medicine into the wart to help the body's immune system fight off the wart. Surgery to remove the wart. Follow these instructions at home: Medicines Use over-the-counter and prescription medicines only as told by your health care provider. Do not use over-the-counter wart medicines on your face or genitals unless your health care provider tells you to do that. Lifestyle Keep your immune system healthy. To do this: Eat a healthy, balanced diet. Get enough sleep. Do not use any products that contain nicotine or tobacco. These products include cigarettes, chewing tobacco, and vaping devices, such as e-cigarettes. If you need help quitting, ask your health care provider. General instructions  Wash your hands after you touch a wart. Do not scratch or pick at a wart. Avoid shaving hair that is over a wart. Keep all follow-up visits. This is important. Contact a health care provider if: Your warts do not improve after treatment. You have redness, swelling, or pain at the site of a wart. You have bleeding from a wart that does not stop with light pressure. You have   diabetes and you develop a wart. Summary Warts are small growths on the skin. They are common and can occur on many areas of the body. In many cases, warts do not need treatment. Sometimes treatment is wanted. If treatment is needed or wanted, there are several treatment options. Apply over-the-counter and prescription medicines only as told by your health care provider. Wash your hands after you touch a wart. Keep all follow-up visits. This is important. This information is not intended  to replace advice given to you by your health care provider. Make sure you discuss any questions you have with your health care provider. Document Revised: 09/14/2021 Document Reviewed: 09/14/2021 Elsevier Patient Education  2024 [employer].

## 2024-05-27 NOTE — Progress Notes (Signed)
 Established Patient Office Visit  Subjective   Patient ID: Rebecca Sweeney, female    DOB: [birth year]  Age: 63 y.o. MRN: [id number]  Chief Complaint  Patient presents with   Pain    Rt foot and 4th toe pain    63y/o caucasian female established patient here for toe pain 8/10 hurts to put weight on toe thinks she needs area shaved again last completed 04/15/24.  Denied fever/discharge/trauma/bruising/redness..      ROS   Constitutional:  Negative for chills, diaphoresis and fever.  Respiratory:  Negative for cough.   Gastrointestinal:  Negative for diarrhea, nausea and vomiting.  Musculoskeletal:  Positive for myalgias.  Skin:  Positive for rash. Negative for itching.  Neurological:  Negative for dizziness, tingling, tremors, weakness and headaches.  Objective:     BP 105/66 (BP Location: Left Arm, Patient Position: Sitting, Cuff Size: Normal)   Pulse 66   Temp (!) 97 F (36.1 C) (Tympanic)   Resp 18   LMP 03/18/2012   SpO2 97%    Physical Exam Vitals and nursing note reviewed.  Constitutional:      General: She is awake. She is not in acute distress.    Appearance: Normal appearance. She is well-developed, well-groomed and normal weight. She is not ill-appearing, toxic-appearing or diaphoretic.  HENT:     Head: Normocephalic and atraumatic.     Jaw: There is normal jaw occlusion.     Salivary Glands: Right salivary gland is not diffusely enlarged. Left salivary gland is not diffusely enlarged.     Right Ear: Hearing and external ear normal.     Left Ear: Hearing and external ear normal.     Nose: Nose normal. No congestion or rhinorrhea.     Mouth/Throat:     Lips: Pink. No lesions.     Mouth: Mucous membranes are moist.     Pharynx: Oropharynx is clear.  Eyes:     General: Lids are normal. Vision grossly intact. Gaze aligned appropriately. No scleral icterus.       Right eye: No discharge.        Left eye: No discharge.     Extraocular Movements: Extraocular  movements intact.     Conjunctiva/sclera: Conjunctivae normal.     Pupils: Pupils are equal, round, and reactive to light.  Neck:     Trachea: Trachea normal.  Cardiovascular:     Rate and Rhythm: Normal rate and regular rhythm.  Pulmonary:     Effort: Pulmonary effort is normal. No respiratory distress.     Breath sounds: Normal breath sounds and air entry. No stridor or transmitted upper airway sounds. No wheezing.     Comments: Spoke full sentences without difficulty; no cough observed in exam room Abdominal:     General: Abdomen is flat.     Palpations: Abdomen is soft.  Musculoskeletal:        General: Tenderness present. No signs of injury. Normal range of motion.     Cervical back: Normal range of motion and neck supple. No rigidity or tenderness.     Right lower leg: No edema.     Left lower leg: No edema.     Right foot: Normal range of motion and normal capillary refill. Tenderness present. No deformity, bunion, Charcot foot, foot drop, prominent metatarsal heads, laceration, bony tenderness or crepitus. Normal pulse.     Comments: Patient limping favoring right foot on arrival to clinic; see skin exam toes 4 and 5 right with mild TTP  Lymphadenopathy:     Head:     Right side of head: No submandibular or preauricular adenopathy.     Left side of head: No submandibular or preauricular adenopathy.     Cervical: No cervical adenopathy.     Right cervical: No superficial cervical adenopathy.    Left cervical: No superficial cervical adenopathy.  Skin:    General: Skin is warm and dry.     Capillary Refill: Capillary refill takes less than 2 seconds.     Coloration: Skin is not ashen, cyanotic, jaundiced, mottled, pale or sallow.     Findings: rash present. No abrasion, abscess, acne, bruising, burn, ecchymosis, signs of injury, laceration, petechiae or wound. Rash is scaling. Rash is not crusting, nodular, papular, purpuric, pustular, urticarial or vesicular.     Nails: There  is no clubbing.         Comments:  punctates noted and callous 4mm right lateral 4th toe between 4-5 tender dry Neurological:     General: No focal deficit present.     Mental Status: She is alert and oriented to person, place, and time. Mental status is at baseline.     GCS: GCS eye subscore is 4. GCS verbal subscore is 5. GCS motor subscore is 6.     Cranial Nerves: Cranial nerves 2-12 are intact. No cranial nerve deficit, dysarthria or facial asymmetry.     Sensory: Sensation is intact.     Motor: Motor function is intact. No weakness, tremor, atrophy, abnormal muscle tone or seizure activity.     Coordination: Coordination is intact. Coordination normal.     Gait: Gait is intact. Gait normal.     Comments: In/out of chair and on/off exam table without difficulty; gait sure and steady in clinic; bilateral hand grasp equal 5/5  Psychiatric:        Attention and Perception: Attention and perception normal.        Mood and Affect: Mood and affect normal.        Speech: Speech normal.        Behavior: Behavior normal. Behavior is cooperative.        Thought Content: Thought content normal.        Cognition and Memory: Cognition and memory normal.        Judgment: Judgment normal.   No results found for any visits on 05/27/24.    The 10-year ASCVD risk score (Arnett DK, et al., 2019) is: 6.5%    Assessment & Plan:   Problem List Items Addressed This Visit   None Visit Diagnoses       Plantar wart of right foot    -  Primary     Patient reported shoe more comfortable after procedure and able to walk normally again.  Verbal consent obtained and shaved with #10 blade after cleansing with povidone/iodine right 4th toe lateral  Shaved wart until pinpoint of blood noted and slight tenderness. Band-Aid and neosporin applied from clinic stock. Patient reported minimal pain after procedure. Discussed with patient to keep clean and dry and follow up in 7 days for reshave if punctates/hard  area noted again.  Cryotherapy not available in [facility] clinic.  PCM may have available.  Discussed with patient 2-3  treatments may be required to eradicate wart. Exitcare handout on plantar warts Patient notified to expect scab at treated area but if erythema, purulent drainage, red streak radiating up hand/foot to return to clinic for reevaluation. Patient agreed with plan of care, verbalized understanding  of instructions/information and had no further questions at this time.    Return if symptoms worsen or fail to improve.    Ellouise DELENA Hope, NP

## 2024-07-13 ENCOUNTER — Ambulatory Visit: Payer: Self-pay | Admitting: Registered Nurse

## 2024-07-13 ENCOUNTER — Encounter: Payer: Self-pay | Admitting: Registered Nurse

## 2024-07-13 VITALS — BP 111/84 | HR 80 | Temp 97.4°F | Resp 20

## 2024-07-13 DIAGNOSIS — M5416 Radiculopathy, lumbar region: Secondary | ICD-10-CM

## 2024-07-13 DIAGNOSIS — R202 Paresthesia of skin: Secondary | ICD-10-CM

## 2024-07-13 MED ORDER — MELOXICAM 15 MG PO TABS: 15.0000 mg | ORAL_TABLET | Freq: Every day | ORAL | 1 refills | Status: AC | PRN

## 2024-07-13 MED ORDER — PREDNISONE 10 MG PO TABS: ORAL_TABLET | ORAL | Status: AC

## 2024-07-13 MED ORDER — CYCLOBENZAPRINE HCL 10 MG PO TABS: 5.0000 mg | ORAL_TABLET | Freq: Three times a day (TID) | ORAL | Status: AC | PRN

## 2024-07-13 NOTE — Patient Instructions (Signed)
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
## 2024-07-20 ENCOUNTER — Encounter: Payer: Self-pay | Admitting: Registered Nurse

## 2024-07-20 ENCOUNTER — Telehealth: Payer: Self-pay | Admitting: Registered Nurse

## 2024-07-20 DIAGNOSIS — M5416 Radiculopathy, lumbar region: Secondary | ICD-10-CM

## 2024-07-20 NOTE — Telephone Encounter (Signed)
 Patient reported prednisone  has helped a lot and pain improved feeling much better but plans to take it easy the rest of week and has scheduled Friday off after Thanksgiving holiday to relax at home this week also.  Patient asking if she can extend taper.  Discussed to finish current as ordered.  Continue flexeril  and mobic  along with tylenol  as needed.  If pain worsening to notify me via my chart.  Patient has not had call from neurology yet.  Discussed due to holiday I didn't expect them to call until next week and if she is not contacted to call office number I gave her 07/13/24 provider she chose network for her insurance.  Patient gait sure and steady.  1 muscle spasm after she had leaned back onto sorting table during conversation resolved with gentle stretching.  Gait sure and steady.  Patient posture more upright today and relaxed than last week.  Denied weakness extremities, loss of bowel/bladder control, saddle paresthesias or new symptoms.  Patient agreed with plan of care and had no further questions at this time.

## 2024-07-27 ENCOUNTER — Ambulatory Visit: Payer: Self-pay | Admitting: General Practice

## 2024-07-27 VITALS — BP 118/76 | HR 90

## 2024-07-27 DIAGNOSIS — M545 Low back pain, unspecified: Secondary | ICD-10-CM | POA: Insufficient documentation

## 2024-07-27 DIAGNOSIS — M5416 Radiculopathy, lumbar region: Secondary | ICD-10-CM | POA: Insufficient documentation

## 2024-07-27 MED ORDER — PREDNISONE 10 MG PO TABS: ORAL_TABLET | ORAL | 0 refills | Status: AC

## 2024-07-27 NOTE — Progress Notes (Signed)
 Employee presents to the clinic with continued back pain after a visit with Neurologist this am. Also requested B/P to be taken, it was high at the appointment. Has had imaging completed at the visit. Requests something for pain reports taking a muscle relaxant this morning.  B/P WNL, denies need for heat wrap, offered OTC pain meds, she chose 650 mg Acetaminophen . NP to follow up further.

## 2024-07-27 NOTE — Telephone Encounter (Signed)
 Patient to clinic was seen by neurology today and rx for gabapentin sent to her pharmacy by neurology. Asked for BP recheck with RN Karene 118/76 as was elevated at neurology appt.  Patient reported pain was very bad this am.  She doesn't feel she can continue at work today if no changes in medication.  She took 4 days off during holiday break and didn't exert/put up decorations/cook and helped some but pain was worsening after stopping prednisone  taper last week.  Tylenol  not helping enough along with muscle relaxant and mobic  15 mg po daily.   Patient asking if she can have prednisone  taper as it has been the only thing to help.  Steroid injection scheduled for 2 weeks from now.  Discussed with patient lower dose 40mg  taper today dispensed prednisone  10mg  21 tabs from [facility] to patient  May continue tylenol  and gapabentin per neurology also. Discussed gabapentin can cause drowsiness.   Reviewed xray results and provider results note with patient and showed her images similar to her findings on computer  Encouraged her to scheduled PT. Per neurology: 5. Adjuvant Medications: - Gabapentin titration ordered - Optimize conservative measures including heat/ice therapy to affected site, acetaminophen  and NSAIDs as needed and per label recommendations, and trial OTC lidocaine patches.  6. Specialized treatments:  - PT referral placed  7. Imaging:  - Lumbar XR ordered  8. Procedures: - Left L4-S1 TFESI  - Risks and benefits of procedure including but not limited to bleeding, infection, nerve damage, and damage to surrounding structures discussed with patient and all questions were answered to her satisfaction.  9. Follow up for Left L4-S1 TFESI, follow up 4-6 weeks after procedure.   MRI pending insurance approval.  Xray results  IMPRESSION: Equivocal for L3 pars interarticularis defects. No fracture or spondylolisthesis. Consider MRI lumbar spine.  Electronically Signed by: Prentice Clay, MD on  07/27/2024 11:44 AM Narrative  TECHNIQUE: 5 views of the lumbar spine  COMPARISON: None  INDICATION: lumbar radiculopathy  FINDINGS: L3 pars interarticularis defect suspected on the neutral lateral view but not additional views, possibly artifact. Disc spaces are preserved. Mild L5-S1 facet arthrosis. Vertebral body heights are maintained. Procedure Note  Clay Prentice, MD - 07/27/2024 Formatting of this note might be different from the original. TECHNIQUE: 5 views of the lumbar spine  COMPARISON: None  INDICATION: lumbar radiculopathy  FINDINGS: L3 pars interarticularis defect suspected on the neutral lateral view but not additional views, possibly artifact. Disc spaces are preserved. Mild L5-S1 facet arthrosis. Vertebral body heights are maintained.   IMPRESSION: Equivocal for L3 pars interarticularis defects. No fracture or spondylolisthesis. Consider MRI lumbar spine.  Electronically Signed by: Prentice Clay, MD on 07/27/2024 11:44 AM Resulting Agency Comment  [id number] Exam End: 07/27/24 09:10   Specimen Collected: 07/27/24 11:43 Last Resulted: 07/27/24 11:44  Received From: [facility]  Result Received: 07/27/24 14:35    Patient verbalized understanding information/instructions and had no further questions at this time.

## 2024-07-29 NOTE — Telephone Encounter (Signed)
 Patient reported prednisone  and gabapentin helping with back and leg pain decreased radiation into left leg but still having numbness.  Had trouble falling asleep last night.   Denied saddle paresthesias/arm/leg weakness or bowel/bladder incontinence.  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 Discussed with patient most likely related to prednisone  and hopefully sleep will improve as dose tapered.  Discussed gabapentin typically causes drowsiness and to take at bedtime also.  Patient verbalized understanding information and had no further questions at this time.

## 2024-08-09 ENCOUNTER — Encounter: Payer: Self-pay | Admitting: Registered Nurse

## 2024-08-10 NOTE — Telephone Encounter (Signed)
 Patient reported pain management did back injection yesterday with steroids.  MRI still pending approval by insurance.  Back pain slightly improved but still interrupting her daily life/sleep.  Denied new or worsening symptoms eg red flags loss of bowel/bladder control, saddle paresthesias or arm leg weakness.  Here today for ice pack and ace bandage to hold it in place given zip lock bag for ice and 1 4 ace wrap as chemical packs typically don't last longer than 10 minutes.  Patient stated taking mobic  and gabapentin.  Given tylenol  1000mg  po q6h 4 UD from clinic stock today also.  Gait sure and steady AROM unchanged from last week.  Patient reported pain 7-8/10 again today.  Skin warm dry and pink respirations even and unlabored RA A&Ox3  Patient to follow up prn with pain management/clinic staff if new/worsening symptoms  Patient agreed with plan of care and had no further questions at this time.

## 2024-08-12 NOTE — Telephone Encounter (Signed)
 Patient seen in [facility]center stated back pain has eased a little 7/10 still having some radiation to thighs left greater than right.  She has stopped gabapentin as it was affecting her concentration at work and drowsy.  Taking flexeril  when at home and tylenol  1000mg  po q6h prn pain.  Gait sure and steady respirations even and unlabored RA skin warm dry and pink.  Extremity strength 5/5 bilaterally  Denied loss of bowel/bladder control, saddle paresthesias or arm/leg weakness.  Keep follow up as scheduled with pain management for MRI.  Patient agreed with plan of care and had no further questions at this time.

## 2024-08-24 ENCOUNTER — Encounter: Payer: Self-pay | Admitting: Registered Nurse

## 2024-08-24 ENCOUNTER — Ambulatory Visit: Payer: Self-pay | Admitting: Registered Nurse

## 2024-08-24 ENCOUNTER — Telehealth: Payer: Self-pay | Admitting: General Practice

## 2024-08-24 VITALS — BP 109/87 | HR 98 | Temp 97.7°F | Resp 16

## 2024-08-24 DIAGNOSIS — M5416 Radiculopathy, lumbar region: Secondary | ICD-10-CM

## 2024-08-24 DIAGNOSIS — J039 Acute tonsillitis, unspecified: Secondary | ICD-10-CM

## 2024-08-24 DIAGNOSIS — M5126 Other intervertebral disc displacement, lumbar region: Secondary | ICD-10-CM | POA: Insufficient documentation

## 2024-08-24 DIAGNOSIS — J069 Acute upper respiratory infection, unspecified: Secondary | ICD-10-CM

## 2024-08-24 DIAGNOSIS — M7138 Other bursal cyst, other site: Secondary | ICD-10-CM | POA: Insufficient documentation

## 2024-08-24 LAB — POC COVID19 BINAXNOW: SARS Coronavirus 2 Ag: NEGATIVE

## 2024-08-24 MED ORDER — BENZONATATE 200 MG PO CAPS: 200.0000 mg | ORAL_CAPSULE | Freq: Three times a day (TID) | ORAL | 1 refills | Status: AC | PRN

## 2024-08-24 MED ORDER — AMOXICILLIN 875 MG PO TABS: 875.0000 mg | ORAL_TABLET | Freq: Two times a day (BID) | ORAL | 0 refills | Status: AC

## 2024-08-24 NOTE — Progress Notes (Signed)
 "  Established Patient Office Visit  Subjective   Patient ID: Rebecca Sweeney, female    DOB: 1961/01/10  Age: 63 y.o. MRN: [id number]  Chief Complaint  Patient presents with   URI    Started last night    63y/o caucasian female established worsening fatigue, nasal congestion, headache, sinus pressure, and cough. Sick contacts at work.  Rested during Christmas holiday did not travel.  Mucous chunky foul tasting  Has smoked today worsens cough Denied fever/vomiting/diarrhea.  Still having back pain but not so bad needs more prednisone  and not taking gabapentin due to drowsiness impairs her at work  Denied red flags e.g. saddle paresthesias/arm/leg weakness or incontinence of bowel/bladder.  MRI completed and received results from pain management      Review of Systems  Constitutional:  Positive for chills and malaise/fatigue. Negative for fever.  HENT:  Positive for congestion and sinus pain. Negative for ear discharge, ear pain, hearing loss, nosebleeds and tinnitus.   Eyes:  Negative for pain, discharge and redness.  Respiratory:  Positive for cough, sputum production and wheezing. Negative for stridor.   Cardiovascular:  Negative for chest pain.  Gastrointestinal:  Negative for diarrhea, nausea and vomiting.  Genitourinary:  Negative for dysuria.  Musculoskeletal:  Positive for back pain. Negative for falls and joint pain.  Skin:  Negative for itching and rash.      Objective:     BP 109/87 (BP Location: Left Arm, Patient Position: Sitting, Cuff Size: Normal)   Pulse 98   Temp 97.7 F (36.5 C) (Tympanic)   Resp 16   LMP 03/18/2012   SpO2 99%    Physical Exam Vitals and nursing note reviewed.  Constitutional:      General: She is awake. She is not in acute distress.    Appearance: Normal appearance. She is well-developed and well-groomed. She is ill-appearing. She is not toxic-appearing or diaphoretic.  HENT:     Head: Normocephalic and atraumatic.     Jaw: There is  normal jaw occlusion. No trismus, tenderness or pain on movement.     Salivary Glands: Right salivary gland is not diffusely enlarged or tender. Left salivary gland is not diffusely enlarged or tender.     Right Ear: Hearing and external ear normal. No decreased hearing noted. No laceration, drainage, swelling or tenderness. A middle ear effusion is present. There is no impacted cerumen. No foreign body. No mastoid tenderness. No PE tube. No hemotympanum. Tympanic membrane is not injected, scarred, perforated, erythematous, retracted or bulging.     Left Ear: Hearing and external ear normal. No decreased hearing noted. No laceration, drainage, swelling or tenderness. A middle ear effusion is present. There is no impacted cerumen. No foreign body. No mastoid tenderness. No PE tube. No hemotympanum. Tympanic membrane is not injected, scarred, perforated, erythematous, retracted or bulging.     Ears:     Comments: Bilateral TMs intact air fluid level clear; no debris noted in bilateral auditory canals    Nose: No congestion or rhinorrhea.     Right Nostril: No epistaxis.     Left Nostril: No epistaxis.     Right Turbinates: Enlarged and swollen. Not pale.     Left Turbinates: Enlarged and swollen. Not pale.     Right Sinus: Maxillary sinus tenderness and frontal sinus tenderness present.     Left Sinus: Maxillary sinus tenderness and frontal sinus tenderness present.     Comments: Frontal greater than maxillary sinuses bilaterally TTP; bilateral allergic shiners; nasal  sniffing/congestion audible in exam room; cobblestoning posterior pharynx; tonsils erythema 2+/4 bilaterally    Mouth/Throat:     Lips: Pink. No lesions.     Mouth: Mucous membranes are moist. No oral lesions or angioedema.     Dentition: No gum lesions.     Tongue: No lesions. Tongue does not deviate from midline.     Palate: No mass and lesions.     Pharynx: Uvula midline. Pharyngeal swelling, posterior oropharyngeal erythema and  postnasal drip present. No oropharyngeal exudate or uvula swelling.     Tonsils: No tonsillar exudate or tonsillar abscesses. 2+ on the right. 2+ on the left.  Eyes:     General: Lids are normal. Vision grossly intact. Gaze aligned appropriately. Allergic shiner present. No scleral icterus.       Right eye: No discharge.        Left eye: No discharge.     Extraocular Movements: Extraocular movements intact.     Conjunctiva/sclera: Conjunctivae normal.     Right eye: Right conjunctiva is not injected. No chemosis, exudate or hemorrhage.    Left eye: Left conjunctiva is not injected. No chemosis, exudate or hemorrhage.    Pupils: Pupils are equal, round, and reactive to light.  Neck:     Trachea: Trachea and phonation normal.  Cardiovascular:     Rate and Rhythm: Normal rate and regular rhythm.     Pulses: Normal pulses.          Radial pulses are 2+ on the right side and 2+ on the left side.     Heart sounds: Normal heart sounds, S1 normal and S2 normal.  Pulmonary:     Effort: Pulmonary effort is normal. No respiratory distress.     Breath sounds: Normal breath sounds and air entry. No stridor or transmitted upper airway sounds. No decreased breath sounds, wheezing, rhonchi or rales.     Comments: Spoke full sentences without difficulty; intermittent throat clearing and nonproductive cough observed in exam room Abdominal:     General: Abdomen is flat.  Musculoskeletal:     Right hand: Normal strength. Normal capillary refill.     Left hand: Normal strength. Normal capillary refill.     Cervical back: Normal range of motion and neck supple. No swelling, edema, deformity, erythema, signs of trauma, lacerations, rigidity, torticollis, tenderness or crepitus. No pain with movement. Normal range of motion.     Thoracic back: No swelling, edema, deformity, signs of trauma, lacerations, spasms or tenderness. Normal range of motion.     Lumbar back: No swelling, edema, deformity, signs of trauma  or lacerations. Decreased range of motion.     Right lower leg: No edema.     Left lower leg: No edema.     Comments: Patient walking upright normal heel toe gait and on/off exam table without difficulty; at baseline for previous 2 weeks  Lymphadenopathy:     Head:     Right side of head: No submental, submandibular, tonsillar, preauricular, posterior auricular or occipital adenopathy.     Left side of head: No submental, submandibular, tonsillar, preauricular, posterior auricular or occipital adenopathy.     Cervical: No cervical adenopathy.     Right cervical: No superficial, deep or posterior cervical adenopathy.    Left cervical: No superficial, deep or posterior cervical adenopathy.  Skin:    General: Skin is warm and dry.     Capillary Refill: Capillary refill takes less than 2 seconds.     Coloration: Skin is  not ashen, cyanotic, jaundiced, mottled, pale or sallow.     Findings: No abrasion, abscess, acne, bruising, burn, ecchymosis, erythema, signs of injury, laceration, lesion, petechiae, rash or wound.     Nails: There is no clubbing.     Comments: Anterior face/neck/hands visually inspected  Neurological:     General: No focal deficit present.     Mental Status: She is alert and oriented to person, place, and time. Mental status is at baseline.     GCS: GCS eye subscore is 4. GCS verbal subscore is 5. GCS motor subscore is 6.     Cranial Nerves: No cranial nerve deficit, dysarthria or facial asymmetry.     Motor: Motor function is intact. No weakness, tremor, atrophy, abnormal muscle tone or seizure activity.     Coordination: Coordination is intact. Coordination normal.     Gait: Gait is intact. Gait normal.     Comments: In/out of chair and on/off exam table without difficulty; gait sure and steady in clinic; bilateral hand grasp equal 5/5  Psychiatric:        Attention and Perception: Attention and perception normal.        Mood and Affect: Affect normal. Mood is depressed.         Speech: Speech normal.        Behavior: Behavior normal. Behavior is cooperative.        Thought Content: Thought content normal.        Cognition and Memory: Cognition and memory normal.        Judgment: Judgment normal.     Comments: Tired of back pain and being sick along with family members not helping her when she needs help      Results for orders placed or performed in visit on 08/24/24  POC COVID-19  Result Value Ref Range   SARS Coronavirus 2 Ag Negative Negative  MPRESSION:  1.  Disc extrusion with superior migration at L2-3, severely narrowing the left neural foramen with mass effect on the exiting left L2 nerve root. There is also mild to moderate left lateral recess narrowing with contact of the descending left L3 nerve root. 2.  Mild L4-5 spinal canal and lateral recess stenosis from disc bulging and 4 mm right facet joint synovial cyst, possibly contacting the descending right L5 nerve root. 3.  Mild right greater than left L4-5 neural foraminal stenosis. 4.  Left dorsal paraspinal muscular strain. 5.  No substantial listhesis. No pars defects appreciated.  Electronically Signed by: Lonni Miyamoto on 08/17/2024 11:30 AM Narrative  MRI LUMBAR SPINE WITHOUT CONTRAST  INDICATION: abnoraml XR, probale pars defect, lumbar radiculopathy  TECHNIQUE: Multiplanar, multisequence MR imaging of the lumbar spine was obtained without intravenous administration of contrast.  COMPARISON: Lumbar radiograph 07/27/2024  FINDINGS:  Vertebrae/alignment: Vertebral body heights are maintained. No marrow edema. No substantial listhesis or scoliosis. No pars defects appreciated. Small benign vertebral hemangioma within the L5 vertebral body.  Conus medullaris: In normal position. Normal signal and contour.  Degenerative changes:  L1-2: No substantial canal or foraminal stenosis.  L2-3: Left foraminal disc extrusion with superior migration, severely narrowing the left  neural foramen with mass effect on the exiting left L2 nerve root. Mild to moderate narrowing of the left lateral recess with contact of the descending left L3 nerve root. Mild disc desiccation.  L3-4: No substantial canal or foraminal stenosis. Mild disc desiccation.  L4-5: Mild spinal canal and lateral recess stenosis from broad disc bulging and 4 mm anteromedially-projecting  right facet joint synovial cyst. Possible contact of the descending right L5 nerve root. Mild right greater than left foraminal stenosis from disc bulging and small right foraminal annular fissure. Mild disc desiccation. Mild facet arthropathy. Posteriorly-directed 9 mm right facet joint synovial cyst.  L5-S1: Tiny central posterior disc protrusion and annular fissure without discrete neural impingement. No substantial canal or foraminal stenosis.  Paraspinal soft tissues: Edema within the left dorsal paraspinal musculature, suggesting muscular strain.  Additional findings: None. Procedure Note  Leontine Lonni PARAS, MD - 08/17/2024 Formatting of this note might be different from the original. MRI LUMBAR SPINE WITHOUT CONTRAST  INDICATION: abnoraml XR, probale pars defect, lumbar radiculopathy  TECHNIQUE: Multiplanar, multisequence MR imaging of the lumbar spine was obtained without intravenous administration of contrast.  COMPARISON: Lumbar radiograph 07/27/2024  FINDINGS:  Vertebrae/alignment: Vertebral body heights are maintained. No marrow edema. No substantial listhesis or scoliosis. No pars defects appreciated. Small benign vertebral hemangioma within the L5 vertebral body.  Conus medullaris: In normal position. Normal signal and contour.  Degenerative changes:  L1-2: No substantial canal or foraminal stenosis.  L2-3: Left foraminal disc extrusion with superior migration, severely narrowing the left neural foramen with mass effect on the exiting left L2 nerve root. Mild to moderate narrowing of the left  lateral recess with contact of the descending left L3 nerve root. Mild disc desiccation. MPRESSION:  1.  Disc extrusion with superior migration at L2-3, severely narrowing the left neural foramen with mass effect on the exiting left L2 nerve root. There is also mild to moderate left lateral recess narrowing with contact of the descending left L3 nerve root. 2.  Mild L4-5 spinal canal and lateral recess stenosis from disc bulging and 4 mm right facet joint synovial cyst, possibly contacting the descending right L5 nerve root. 3.  Mild right greater than left L4-5 neural foraminal stenosis. 4.  Left dorsal paraspinal muscular strain. 5.  No substantial listhesis. No pars defects appreciated.  Electronically Signed by: Lonni Leontine on 08/17/2024 11:30 AM Narrative MRI LUMBAR SPINE WITHOUT CONTRAST  INDICATION: abnoraml XR, probale pars defect, lumbar radiculopathy  TECHNIQUE: Multiplanar, multisequence MR imaging of the lumbar spine was obtained without intravenous administration of contrast.  COMPARISON: Lumbar radiograph 07/27/2024  FINDINGS:  Vertebrae/alignment: Vertebral body heights are maintained. No marrow edema. No substantial listhesis or scoliosis. No pars defects appreciated. Small benign vertebral hemangioma within the L5 vertebral body.  Conus medullaris: In normal position. Normal signal and contour.  Degenerative changes:  L1-2: No substantial canal or foraminal stenosis.  L2-3: Left foraminal disc extrusion with superior migration, severely narrowing the left neural foramen with mass effect on the exiting left L2 nerve root. Mild to moderate narrowing of the left lateral recess with contact of the descending left L3 nerve root. Mild disc desiccation.  L3-4: No substantial canal or foraminal stenosis. Mild disc desiccation.  L4-5: Mild spinal canal and lateral recess stenosis from broad disc bulging and 4 mm anteromedially-projecting right facet joint synovial cyst.  Possible contact of the descending right L5 nerve root. Mild right greater than left foraminal stenosis from disc bulging and small right foraminal annular fissure. Mild disc desiccation. Mild facet arthropathy. Posteriorly-directed 9 mm right facet joint synovial cyst.  L5-S1: Tiny central posterior disc protrusion and annular fissure without discrete neural impingement. No substantial canal or foraminal stenosis.  Paraspinal soft tissues: Edema within the left dorsal paraspinal musculature, suggesting muscular strain.  Additional findings: None. Procedure Note Leontine Lonni PARAS, MD - 08/17/2024  Formatting of this note might be different from the original. MRI LUMBAR SPINE WITHOUT CONTRAST  INDICATION: abnoraml XR, probale pars defect, lumbar radiculopathy  TECHNIQUE: Multiplanar, multisequence MR imaging of the lumbar spine was obtained without intravenous administration of contrast.  COMPARISON: Lumbar radiograph 07/27/2024  FINDINGS:  Vertebrae/alignment: Vertebral body heights are maintained. No marrow edema. No substantial listhesis or scoliosis. No pars defects appreciated. Small benign vertebral hemangioma within the L5 vertebral body.  Conus medullaris: In normal position. Normal signal and contour.  Degenerative changes:  L1-2: No substantial canal or foraminal stenosis.  L2-3: Left foraminal disc extrusion with superior migration, severely narrowing the left neural foramen with mass effect on the exiting left L2 nerve root. Mild to moderate narrowing of the left lateral recess with contact of the descending left L3 nerve root. Mild disc desiccation.  L3-4: No substantial canal or foraminal stenosis. Mild disc desiccation.  L4-5: Mild spinal canal and lateral recess stenosis from broad disc bulging and 4 mm anteromedially-projecting right facet joint synovial cyst. Possible contact of the descending right L5 nerve root. Mild right greater than left foraminal stenosis from  disc bulging and small right foraminal annular fissure. Mild disc desiccation. Mild facet arthropathy. Posteriorly-directed 9 mm right facet joint synovial cyst.  L5-S1: Tiny central posterior disc protrusion and annular fissure without discrete neural impingement. No substantial canal or foraminal stenosis.  Paraspinal soft tissues: Edema within the left dorsal paraspinal musculature, suggesting muscular strain.  Additional findings: None.    IMPRESSION:  1.  Disc extrusion with superior migration at L2-3, severely narrowing the left neural foramen with mass effect on the exiting left L2 nerve root. There is also mild to moderate left lateral recess narrowing with contact of the descending left L3 nerve root. 2.  Mild L4-5 spinal canal and lateral recess stenosis from disc bulging and 4 mm right facet joint synovial cyst, possibly contacting the descending right L5 nerve root. 3.  Mild right greater than left L4-5 neural foraminal stenosis. 4.  Left dorsal paraspinal muscular strain. 5.  No substantial listhesis. No pars defects appreciated.  Electronically Signed by: Christopher Nguyen on 08/17/2024 11:30 AM L3-4: No substantial canal or foraminal stenosis. Mild disc desiccation.  L4-5: Mild spinal canal and lateral recess stenosis from broad disc bulging and 4 mm anteromedially-projecting right facet joint synovial cyst. Possible contact of the descending right L5 nerve root. Mild right greater than left foraminal stenosis from disc bulging and small right foraminal annular fissure. Mild disc desiccation. Mild facet arthropathy. Posteriorly-directed 9 mm right facet joint synovial cyst.  L5-S1: Tiny central posterior disc protrusion and annular fissure without discrete neural impingement. No substantial canal or foraminal stenosis.  Paraspinal soft tissues: Edema within the left dorsal paraspinal musculature, suggesting muscular strain.  Additional findings:  None.    IMPRESSION:  1.  Disc extrusion with superior migration at L2-3, severely narrowing the left neural foramen with mass effect on the exiting left L2 nerve root. There is also mild to moderate left lateral recess narrowing with contact of the descending left L3 nerve root. 2.  Mild L4-5 spinal canal and lateral recess stenosis from disc bulging and 4 mm right facet joint synovial cyst, possibly contacting the descending right L5 nerve root. 3.  Mild right greater than left L4-5 neural foraminal stenosis. 4.  Left dorsal paraspinal muscular strain. 5.  No substantial listhesis. No pars defects appreciated.  Electronically Signed by: Christopher Nguyen on 08/17/2024 11:30 AM    The 10-year ASCVD  risk score (Arnett DK, et al., 2019) is: 7%    Assessment & Plan:   Problem List Items Addressed This Visit       Nervous and Auditory   Compression of lumbar nerve root     Musculoskeletal and Integument   Lumbar herniated disc   Synovial cyst of lumbar facet joint   Other Visit Diagnoses       Viral URI with cough    -  Primary   Relevant Orders   POC COVID-19 (Completed)     Acute tonsillitis, unspecified etiology         MRI results discussed in detail with patient along with typical care PT and possibly surgery if no response to PT/medications/injection.  Patient refused prednisone  refill at this time.  Discussed red flags e.g. loss of bowel/bladder control, saddle paresthesias or arm/leg weakness see provider same day.  Keep follow up as scheduled with pain management.  Continue daily exercises/stretching.  Exitcare handouts on herniated disk and exercises.  Discussed many viruses circulating in community at this time influenza, covid, adenovirus, RSV, norovirus.  Wash hands and avoid close contact with sneezing/coughing persons consider mask wear when out in community.  Symptoms typically lasting 10-14 days mucous clear to yellow to green then done.  Influenza typically  fever greater than 101F  Hydrate with water to keep urine pale yellow clear and voiding every 2-4 hours and 7-8 hours sleep per night.  Notify NP if wheezing/dyspnea/fever/hemoptysis discussed clinic closed Thurs-Sunday due to federal holiday this week use my chart or email [email or web address].  POCT covid binax now testing x1.  Given tylenol  1000mg  po QID prn pain UD from clinic stock x 4.   Recommend hold smoking while sick and consider complete cessation.  Patient did not want Rx for cessation aids at this time.  Restart flonase  1 spray each nostril BID, saline 2 sprays each nostril q2h wa prn congestion. start augmentin  875mg  po BID x 10 days #20 RF0 dispensed from [facility] to patient  refilled tessalon  pearles 200mg  po TID prn cough #60 RF1 electronic Rx to her pharmacy of choice.  Denied personal or family history of ENT cancer.  Shower BID especially prior to bed. No evidence of systemic bacterial infection, non toxic and well hydrated.  I do not see where any further testing or imaging is necessary at this time.   I will suggest supportive care, rest, good hygiene and encourage the patient to take adequate fluids.  The patient is to return to clinic or EMERGENCY ROOM if symptoms worsen or change significantly.  Exitcare handouts on tonsillitis, viral URI with cough and sinus rinse.  Patient verbalized agreement and understanding of treatment plan and had no further questions at this time.   P2:  Hand washing and cover cough   Return if symptoms worsen or fail to improve.    Ellouise DELENA Hope, NP  "

## 2024-08-24 NOTE — Patient Instructions (Addendum)
 Tonsillitis  Tonsillitis is an infection of the throat that causes the tonsils to become red, tender, and swollen. Tonsils are tissues in the back of your throat. Each tonsil has crevices (crypts). Tonsils normally work to protect the body from infection. What are the causes? Sudden (acute) tonsillitis may be caused by a virus or bacteria, including streptococcal bacteria. Long-lasting (chronic) tonsillitis occurs when the crypts of the tonsils become filled with pieces of food and bacteria, which makes it easy for the tonsils to become repeatedly infected. Tonsillitis can be spread from person to person when it is caused by a virus or bacteria. It may be spread by inhaling droplets that are released with coughing or sneezing. You may also come into contact with viruses or bacteria on surfaces, such as cups or utensils. What are the signs or symptoms? Symptoms of this condition include: A sore throat. This may include trouble swallowing. White patches on the tonsils. Swollen tonsils. Fever. Headache. Tiredness. Loss of appetite. Snoring during sleep when you did not snore before. Small, foul-smelling, yellowish-white pieces of material (tonsilloliths) that you occasionally cough up or spit out. These can cause you to have bad breath. How is this diagnosed? This condition is diagnosed with a physical exam. Diagnosis can be confirmed with the results of lab tests, including a throat culture. How is this treated? Treatment for this condition depends on the cause, but usually focuses on treating the symptoms associated with it. Treatment may include: Medicines to relieve pain and manage fever. Steroid medicines to reduce swelling. Antibiotic medicines if the condition is caused by bacteria. If episodes of tonsillitis are severe and frequent, your health care provider may recommend surgery to remove the tonsils (tonsillectomy). Follow these instructions at home: Medicines Take over-the-counter  and prescription medicines only as told by your health care provider. If you were prescribed an antibiotic medicine, take it as told by your health care provider. Do not stop taking the antibiotic even if you start to feel better. Eating and drinking Drink enough fluid to keep your urine pale yellow. While your throat is sore, eat soft or liquid foods, such as sherbet, soups, or soft, warm cereals, such as oatmeal or hot wheat cereal. Drink warm liquids. Eat frozen ice pops. General instructions Rest as much as possible and get plenty of sleep. Gargle with a mixture of salt and water 3-4 times a day or as needed. To make salt water, completely dissolve -1 tsp (3-6 g) of salt in 1 cup (237 mL) of warm water. Do not swallow the mixture of salt and water. Wash your hands regularly with soap and water for at least 20 seconds. If soap and water are not available, use hand sanitizer. Do not share cups, bottles, or other utensils until your symptoms have gone away. Do not use any products that contain nicotine  or tobacco. These products include cigarettes, chewing tobacco, and vaping devices, such as e-cigarettes. If you need help quitting, ask your health care provider. Keep all follow-up visits. This is important. Contact a health care provider if: You notice large, tender lumps in your neck that were not there before. You have a fever that does not go away after 2-3 days. You develop a rash. You cough up a green, yellow-brown, or bloody substance. You cannot swallow liquids or food for 24 hours. Only one of your tonsils is swollen. Get help right away if: You develop any new symptoms, such as vomiting, severe headache, stiff neck, chest pain, trouble breathing, or  trouble swallowing. You have severe throat pain along with drooling or voice changes. You have severe pain that is not controlled with medicines. You cannot fully open your mouth. You develop redness, swelling, or severe pain  anywhere in your neck. Summary Tonsillitis is an infection of the throat that causes the tonsils to become red, tender, and swollen. The most common symptom is pain in the throat. Tonsillitis is most often caused by a virus or bacteria. Get help right away if you develop any new symptoms, such as vomiting, severe headache, stiff neck, chest pain, or trouble breathing. This information is not intended to replace advice given to you by your health care provider. Make sure you discuss any questions you have with your health care provider. Document Revised: 01/03/2021 Document Reviewed: 01/04/2021 Elsevier Patient Education  2024 [employer]Viral Respiratory Infection A respiratory infection is an illness that affects part of the respiratory system, such as the lungs, nose, or throat. A respiratory infection that is caused by a virus is called a viral respiratory infection. Common types of viral respiratory infections include: A cold. The flu (influenza). A respiratory syncytial virus (RSV) infection. What are the causes? This condition is caused by a virus. The virus may spread through contact with droplets or direct contact with infected people or their mucus or secretions. The virus may spread from person to person (is contagious). What are the signs or symptoms? Symptoms of this condition include: A stuffy or runny nose. A sore throat or cough. Shortness of breath or difficulty breathing. Yellow or green mucus (sputum). Other symptoms may include: A fever. Sweating or chills. Fatigue. Achy muscles. A headache. How is this diagnosed? This condition may be diagnosed based on: Your symptoms. A physical exam. Testing of secretions from the nose or throat. Chest X-ray. How is this treated? This condition may be treated with medicines, such as: Antiviral medicine. This may shorten the length of time a person has symptoms. Expectorants. These make it easier to cough up  mucus. Decongestant nasal sprays. Acetaminophen  or NSAIDs, such as ibuprofen , to relieve fever and pain. Antibiotic medicines are not prescribed for viral infections.This is because antibiotics are designed to kill bacteria. They do not kill viruses. Follow these instructions at home: Managing pain and congestion Take over-the-counter and prescription medicines only as told by your health care provider. If you have a sore throat, gargle with a mixture of salt and water 3-4 times a day or as needed. To make salt water, completely dissolve -1 tsp (3-6 g) of salt in 1 cup (237 mL) of warm water. Use nose drops made from salt water to ease congestion and soften raw skin around your nose. Take 2 tsp (10 mL) of honey at bedtime to lessen coughing at night. Do not give honey to children who are younger than 1 year. Drink enough fluid to keep your urine pale yellow. This helps prevent dehydration and helps loosen up mucus. General instructions  Rest as much as possible. Do not drink alcohol. Do not use any products that contain nicotine  or tobacco. These products include cigarettes, chewing tobacco, and vaping devices, such as e-cigarettes. If you need help quitting, ask your health care provider. Keep all follow-up visits. This is important. How is this prevented?     Get an annual flu shot. You may get the flu shot in late summer, fall, or winter. Ask your health care provider when you should get your flu shot. Avoid spreading your infection to other people. If  you are sick: Wash your hands with soap and water often, especially after you cough or sneeze. Wash for at least 20 seconds. If soap and water are not available, use alcohol-based hand sanitizer. Cover your mouth when you cough. Cover your nose and mouth when you sneeze. Do not share cups or eating utensils. Clean commonly used objects often. Clean commonly touched surfaces. Stay home from work or school as told by your health care  provider. Avoid contact with people who are sick during cold and flu season. This is generally fall and winter. Contact a health care provider if: Your symptoms last for 10 days or longer. Your symptoms get worse over time. You have severe sinus pain in your face or forehead. The glands in your jaw or neck become very swollen. You have shortness of breath. Get help right away if you: Feel pain or pressure in your chest. Have trouble breathing. Faint or feel like you will faint. Have severe and persistent vomiting. Feel confused or disoriented. These symptoms may represent a serious problem that is an emergency. Do not wait to see if the symptoms will go away. Get medical help right away. Call your local emergency services (911 in the [place] Do not drive yourself to the hospital. Summary A respiratory infection is an illness that affects part of the respiratory system, such as the lungs, nose, or throat. A respiratory infection that is caused by a virus is called a viral respiratory infection. Common types of viral respiratory infections include a cold, influenza, and respiratory syncytial virus (RSV) infection. Symptoms of this condition include a stuffy or runny nose, cough, fatigue, achy muscles, sore throat, and fevers or chills. Antibiotic medicines are not prescribed for viral infections. This is because antibiotics are designed to kill bacteria. They are not effective against viruses. This information is not intended to replace advice given to you by your health care provider. Make sure you discuss any questions you have with your health care provider. Document Revised: 11/16/2020 Document Reviewed: 11/16/2020 Elsevier Patient Education  2024 [employer]Exercises for a Herniated Disk Exercises can help you get better if you have a herniated disk. Only do the exercises you were told to do. Make sure you know how to do the exercises safely. Follow the steps below. It's normal to feel  mild discomfort. Stop if you feel pain or your pain gets worse. Do not start these exercises until told by your health care provider. Stretching and range-of-motion exercises These exercises warm up your muscles and joints. They also help with movement and flexibility of your back. They may help to relieve pain, numbness, and tingling. Prone extension on elbows  Lie on your belly on a firm surface. This is called a prone position. Prop yourself up on your elbows. Use your arms to help lift your chest up until you feel a gentle stretch in your belly and your lower back. This will place some of your body weight on your elbows. If this is uncomfortable, try stacking pillows under your chest. Your hips should stay down, against the surface that you're lying on. Keep your hips, back, and butt muscles relaxed. Hold this position for __________ seconds. Slowly relax your upper body and return to the starting position. Repeat __________ times. Complete this exercise __________ times a day. Standing extension  Stand with your feet shoulder-width apart. Place your hands on your lower back, with your palms on your back. Gently arch your back (extension) and press forward with your hands.  Keep your hips over your toes. Do not let your hips drift forward while arching your back. Hold this position for __________ seconds. Slowly return to the starting position. Repeat __________ times. Complete this exercise __________ times a day. Strengthening exercises These exercises build strength and endurance in your back. Endurance is the ability to use your muscles for a long time, even after they get tired. Pelvic tilt  Lie on your back on a firm surface. Bend your knees and keep your feet flat on the floor. Tense your belly muscles. Tip your pelvis up toward the ceiling and flatten your lower back into the floor. To help with this exercise, you may place a small towel under your lower back and try to push  your back into the towel. Hold this position for __________ seconds. Let your muscles relax completely before you repeat this exercise. Repeat __________ times. Complete this exercise __________ times a day. Alternating arm and leg raises  Get on your hands and knees on a firm surface. If you're on a hard floor, you may want to use padding, such as an exercise mat, to cushion your knees. Line up your arms and legs. Your hands should be below your shoulders. Your knees should be below your hips. Lift your left leg behind you. At the same time, raise your right arm and straighten it in front of you. If you have trouble with your balance or the exercise is painful, start by raising one arm or one leg separately first. Do not lift your leg higher than your hip. Do not lift your arm higher than your shoulder. Keep your belly and back muscles tight. Keep your hips facing the ground. Do not arch your back. Keep your balance carefully. Continue to look down at the floor for improved balance. Breathe easy and don't hold your breath. Hold this position for __________ seconds. Slowly return to the starting position and repeat with your right leg and your left arm. Repeat __________ times. Complete this exercise __________ times a day. Bridge  Lie on your back on a firm surface with your knees bent and your feet flat on the floor. Tighten your belly and butt muscles and lift your butt off the floor until your trunk and hips are level with your thighs. You should feel the muscles working in your butt and the back of your thighs. Do not arch your back. If this exercise is too easy, try doing it with your arms crossed over your chest. Hold this position for __________ seconds. Slowly lower your hips to the starting position. Let your muscles relax completely after each repetition. Repeat __________ times. Complete this exercise __________ times a day. This information is not intended to replace advice  given to you by your health care provider. Make sure you discuss any questions you have with your health care provider. Document Revised: 03/20/2023 Document Reviewed: 03/20/2023 Elsevier Patient Education  2024 [employer]Slipped, Ruptured, or Bulging Disk in the Spine (Herniated Disk): What to Know  A herniated disk, also called a ruptured disk or slipped disk, occurs when a disk in the spine bulges out too far. Disks are oval and are made of a soft, spongy center filled with a jelly-like liquid. This jelly-like liquid is surrounded by a tough outer ring. Disks are located between the bones in the spine (vertebrae). They connect the bones in the spine, help the spine move, and keep the bones from rubbing against each other when you move. With this injury,  the spongy center of the disk bulges out or breaks through the outer ring. The spongy center can press on a nerve between the spine and cause pain. The injury can occur anywhere in the back or neck, but it occurs most often in the lower back. What are the causes? Herniated disk may be caused by: Wear and tear that come with age. Sudden injury, such as a strain or sprain. What increases the risk? Aging. This is the main risk factor for this injury. Being a female who's 56-15 years old. Heavy lifting, bending, or twisting. Not getting enough exercise. Being overweight. Smoking or using other tobacco products. What are the signs or symptoms? Symptoms may vary depending on where your herniated disk is located. If your herniated disk is in the lower back, you may have: Sharp pain in part of the hips, butt, or legs. Sharp pain in the lower back, spreading down through the leg into the foot. If your herniated disk is in the neck, you may have: Dizziness. Vertigo. This is a spinning or swaying sensation. Pain in the neck, shoulder, upper arm, forearm, or fingers. You may also have other symptoms, including: Trouble lifting your leg or  arm. Trouble standing on your toes. Trouble squeezing with one of your hands. Numbness or tingling in areas of the hands, arms, feet, or legs. Problems controlling when to pee or poop. This is rare but serious. How is this diagnosed? This condition may be diagnosed based on: Your symptoms. Your medical history. A physical exam. Imaging tests, such as, X-rays, MRI, CT scan, or EMG. How is this treated? Treatment for this condition may include: Resting for a few days or several weeks. Do not do things that cause pain. Do not go into complete bed rest. Do only light activities. If the injury is in your lower back, limit how much you sit. Sitting puts more pressure on the disk. Medicines for pain, swelling, or tense muscle. Ice or heat therapy. Steroid shots for pain or swelling. Physical therapy. In many cases, symptoms go away with treatment over a period of days or weeks. If these treatments don't help, you may need surgery. Follow these instructions at home: Medicines Take your medicines only as told. You may need to take steps to help treat or prevent trouble pooping (constipation), such as: Taking medicines to help you poop. Eating foods high in fiber, like beans, whole grains, and fresh fruits and vegetables. Drinking more fluids as told. Ask your health care provider if it's safe to drive or use machines while taking your medicine. Managing pain, stiffness, and swelling     Use ice or an ice pack as told. Place a towel between your skin and the ice. Leave the ice on for 20 minutes, 2-3 times a day. Use heat as told. Use the heat source that your provider recommends, such as a moist heat pack or a heating pad. Do this as often as told. Place a towel between your skin and the heat source. Leave the heat on for 20-30 minutes. If your skin turns red, take off the ice or heat right away to prevent skin damage. The risk of damage is higher if you can't feel pain, heat, or  cold. Activity Rest as told by your provider. Avoid strict bed rest. Do only light activities that cause no pain. Use good posture. Avoid movements that cause pain. Do not lift anything heavier than 10 lb (4.5 kg) until you're told it's OK. Do not sit  or stand for a long time without getting up or moving around. Exercise as told. Strengthen muscles in your back and belly with exercises, such as swimming or walking. Slowly return to your normal activities and exercise. Ask what things are safe for you to do at home. Ask when you can go back to work or school. General instructions Do not smoke, vape, or use nicotine  or tobacco. Doing this can slow down healing. Do not wear high-heeled shoes. Do not sleep on your belly. If you are overweight, work with your provider to lose weight safely. Keep all follow-up visits. Your provider needs to make sure that your treatment is working. In some cases, the provider may change your treatment plan. How is this prevented? Keep a healthy weight. Maintain physical fitness. Do at least 150 minutes of moderate exercise each week, such as brisk walking or water aerobics. When lifting objects: Keep your feet as far apart as your shoulders or farther apart. Tighten the muscles in your belly. Bend your knees and hips, and keep your spine neutral. Lift using the strength of your legs, not your back. Do not lock your knees straight out. Always ask for help to lift heavy or large objects. Contact a health care provider if: You have back pain or neck pain that does not get better after 6 weeks. You have very bad pain in your back, neck, legs, or arms. You get numbness, tingling, or weakness anywhere in your body. Get help right away if: You can't move your arms or legs. You can't control when you pee or poop. You feel dizzy or you faint. You have trouble breathing. These symptoms may be an emergency. Call 911 right away. Do not wait to see if the symptoms  will go away. Do not drive yourself to the hospital. This information is not intended to replace advice given to you by your health care provider. Make sure you discuss any questions you have with your health care provider. Document Revised: 07/11/2023 Document Reviewed: 05/09/2023 Elsevier Patient Education  2024 [employer].

## 2024-08-24 NOTE — Telephone Encounter (Signed)
 See office note patient seen 08/24/24 had MRI with her pain management provider does not want prednisone  today.  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  Patient has follow up appt scheduled with specialist.  Educated on red flags and she plans to rest this week again.  Denied loss of bowel/bladder control, saddle paresthesias or arm/leg weakness.

## 2024-08-25 NOTE — Telephone Encounter (Signed)
 Erroneous encounter

## 2024-08-25 NOTE — Telephone Encounter (Signed)
 Sore throat and headache continue along with fatigue  A little worse than yesterday took tylenol  and antibiotic this morning and called out sick.  Staying at home and resting.  A&Ox3 spoke full sentences without difficulty  hoarse voice today no audible cough/throat clearing during 3 minute call.  Patient back pain continues denied new or worsening symptoms since yesterday office visit.   Re-evaluation tomorrow via telephone work closed for federal holiday next scheduled work day Friday 27 Aug 2024.  Discussed hydrate clear soup broths, tylenol  1000mg  po q6h prn pain and honey 1 tablespoon every 4 hours may be helpful for sore throat.  Patient stated eating chicken soup this am and last night.  Denied new symptoms e.g. f/c/n/v/d/dyspnea.  Patient verbalized understanding information/instructions, agreed with plan of care and had no further questions at this time.  HR and supervisor notified excused absence today re-evaluation tomorrow.

## 2024-08-26 NOTE — Telephone Encounter (Signed)
 Patient reported cough maybe a little better but still head pressure, rhinitis, fatigue, back pain.  Taking her antibiotic throat not as sore but still irritated.  Has been resting not much energy doesn't feel she could work entire shift tomorrow.  Tolerating po intake but appetite decreased.  A&Ox3 spoke full sentences without difficulty  tylenol  1000mg  po q6h prn pain helping.  Next re-evaluation Sunday 29 Aug 2024.  Patient verbalized understanding information/instructions, agreed with plan of care and had no further questions at this time.

## 2024-08-29 NOTE — Telephone Encounter (Signed)
 Patient reported cough resolved and back pain decreased with rest and medication  left thigh intermittent pain with certain movements only   Taking antibiotic and tessalon  pearles along with tylenol .  Phlegm and mucous production has cleared up.  Denied loss of bowel/bladder control, saddle paresthesias or arm/leg weakness, dyspnea, wheezing or shortness of breath.  Has Tues/Wed next week scheduled off for [employer] holiday.  First PT appt 06 Sep 2024  Feels ready to return to work tomorrow.  HR team notified cleared to return onsite 08/30/24. Patient A&Ox3 spoke full sentences without difficulty no audible cough/congestion/throat clearing during 4 minute call

## 2024-09-21 ENCOUNTER — Encounter: Payer: Self-pay | Admitting: Registered Nurse

## 2024-09-21 ENCOUNTER — Telehealth: Payer: Self-pay | Admitting: Registered Nurse

## 2024-09-21 DIAGNOSIS — J111 Influenza due to unidentified influenza virus with other respiratory manifestations: Secondary | ICD-10-CM

## 2024-09-21 MED ORDER — ONDANSETRON 4 MG PO TBDP: 4.0000 mg | ORAL_TABLET | Freq: Three times a day (TID) | ORAL | 0 refills | Status: AC | PRN

## 2024-09-21 MED ORDER — OSELTAMIVIR PHOSPHATE 75 MG PO CAPS: 75.0000 mg | ORAL_CAPSULE | Freq: Two times a day (BID) | ORAL | 0 refills | Status: AC
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
## 2024-09-22 ENCOUNTER — Telehealth: Payer: Self-pay | Admitting: General Practice

## 2024-09-22 NOTE — Telephone Encounter (Signed)
 RN spoke to Rebecca Sweeney via [employer] this afternoon to assess her progress. She reports a temperature between 99-100 degrees, very fatigued with little appetite, has eaten only a banana today. Her husband was able to pick up new prescriptions for her and she started the meds last evening. He did not purchase a home COVID test, she feels as though she has the flu. Reports drinking lots of fluids, urine is pale yellow.  She is advised to continue rest, fluids and light bland foods as tolerated. If her symptoms worsen, RN advised her to call the [facility]. The clinic team will follow up tomorrow to further assess recovery.

## 2024-09-23 ENCOUNTER — Telehealth: Payer: Self-pay | Admitting: General Practice

## 2024-09-23 NOTE — Telephone Encounter (Signed)
 RN placed an outgoing call to [facility] to assess her progress. Received a voicemail, left a generic message requesting a callback to the clinic for RN to follow up.

## 2024-09-24 NOTE — Telephone Encounter (Signed)
 Patient reported spouse with flu now and fever.  She is still feeling very weak; fever has broke reported temperature 53F today.  Discussed with patient this would be very low and to recheck as normal 27F.  Denied chills/n/v/d/dyspnea.  Reported still taking tessalon  pearles for cough and helping.  Cough dry nonproductive and chicken soup/tea helping with cough also.  Discussed with patient extended excused absence for yesterday and today re-evaluation Sunday via telephone 26 Sep 2024.  Patient A&Ox3 spoke full sentences without difficulty no audible cough/wheezing/congestion during 4 minute call.  Patient instructed to continue hydration with electrolyte rich foods/beverages; rest; calf pumps and to walk around house intermittently to help prevent blood clots in legs with prolonged sitting/lying in bed.  Patient agreed with plan of care and had no further questions at this time.  HR and supervisor notified excused absence per communicable disease policy flu 1/29-1/30/26 not scheduled to work 1/31-09/26/24

## 2024-09-28 ENCOUNTER — Ambulatory Visit: Payer: Self-pay | Admitting: Registered Nurse

## 2024-09-28 ENCOUNTER — Encounter: Payer: Self-pay | Admitting: Registered Nurse

## 2024-09-28 VITALS — HR 82 | Resp 16

## 2024-09-28 DIAGNOSIS — R051 Acute cough: Secondary | ICD-10-CM

## 2024-09-28 DIAGNOSIS — R12 Heartburn: Secondary | ICD-10-CM

## 2024-09-29 NOTE — Telephone Encounter (Signed)
 Patient returned to work 09/28/24 see office note
